# Patient Record
Sex: Male | Born: 1990 | Race: White | Hispanic: No | Marital: Single | Smoking: Never smoker
Health system: Southern US, Community
[De-identification: ages and names within clinical notes are randomized; demographics above are authoritative.]

## PROBLEM LIST (undated history)

## (undated) DIAGNOSIS — M25519 Pain in unspecified shoulder: Secondary | ICD-10-CM

## (undated) DIAGNOSIS — F909 Attention-deficit hyperactivity disorder, unspecified type: Secondary | ICD-10-CM

## (undated) DIAGNOSIS — F32A Depression, unspecified: Secondary | ICD-10-CM

## (undated) DIAGNOSIS — F419 Anxiety disorder, unspecified: Secondary | ICD-10-CM

## (undated) DIAGNOSIS — F329 Major depressive disorder, single episode, unspecified: Secondary | ICD-10-CM

## (undated) DIAGNOSIS — J45909 Unspecified asthma, uncomplicated: Secondary | ICD-10-CM

## (undated) DIAGNOSIS — M549 Dorsalgia, unspecified: Secondary | ICD-10-CM

## (undated) HISTORY — PX: UPPER GI ENDOSCOPY: SHX6162

---
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
## 2010-03-14 ENCOUNTER — Emergency Department (HOSPITAL_COMMUNITY): Admission: EM | Admit: 2010-03-14 | Discharge: 2010-03-14 | Payer: Self-pay | Admitting: Emergency Medicine

## 2010-07-11 ENCOUNTER — Emergency Department (HOSPITAL_COMMUNITY): Admission: EM | Admit: 2010-07-11 | Discharge: 2010-07-11 | Payer: Self-pay | Admitting: Emergency Medicine

## 2010-07-22 ENCOUNTER — Emergency Department (HOSPITAL_COMMUNITY): Admission: EM | Admit: 2010-07-22 | Discharge: 2010-07-22 | Payer: Self-pay | Admitting: Emergency Medicine

## 2010-10-17 ENCOUNTER — Emergency Department (HOSPITAL_COMMUNITY)
Admission: EM | Admit: 2010-10-17 | Discharge: 2010-10-18 | Payer: Self-pay | Source: Home / Self Care | Admitting: Emergency Medicine

## 2011-03-03 NOTE — Discharge Summary (Signed)
[name], Eugene Hicks NO.:  [id number]   MEDICAL RECORD NO.:  [id number]          PATIENT TYPE:  OBV   LOCATION:  6119                         FACILITY:  [facility]   PHYSICIAN:  Gerrianne Scale, M.D.DATE OF BIRTH:  1990/12/25   DATE OF ADMISSION:  06/07/2007  DATE OF DISCHARGE:  06/07/2007                               DISCHARGE SUMMARY   REASON FOR HOSPITALIZATION:  Blood around stool x1 five hours after  abdominal injury.  History of constipation and straining at stool.   SIGNIFICANT FINDINGS:  1. CBC at [facility], normal hemoglobin and hematocrit of 12.2 and      38.4.  2. Coags normal.  3. CT pelvis and abdomen, small amount of free fluid in pelvis.  4. Abdomen exam normal without bruising or tenderness.  5. Rectal exam at [facility], brown stool with blood in rectum, guaiac      positive.  6. Rectal exam at [facility]: No hemorrhoids or fissures seen.  7. No further bleeding while at [facility].   TREATMENT:  1. Protonix 40 mg IV daily.  2. IV fluids, D5, quarter-normal saline with 20 of potassium chloride      at 90 mL per hour.   HOME MEDICATIONS:  1. Strattera 40 mg p.o. daily.  2. Lithium 300 mg q.a.m., 600 mg q.p.m. per oral.  3. Seroquel 300 mg per oral q.h.s.  4. Propranolol 80 mg p.o. daily.  5. Xanax 1 mg p.o. p.r.n. for anxiety.   OPERATIONS AND PROCEDURES:  None.   FINAL DIAGNOSIS:  Rectal bleeding with constipation.   DISCHARGE MEDICATIONS AND INSTRUCTIONS:  MiraLax 17 grams or 1  tablespoon in fluid p.o. daily.   PENDING RESULTS AND ISSUES TO BE FOLLOWED:  Von Willebrand antigen and  ristocetin pending.  Psych meds need to be addressed.   FOLLOWUP:  Dr. Lacie Scotts, [facility], Thursday, August 21  at 10:45.  Mother declined follow up with [facility];  prefers to have PCP manage psych issues.   DISCHARGE WEIGHT:  53.6 kilograms.   CONDITION ON DISCHARGE:  Improved.      Pediatrics  Resident      Gerrianne Scale, M.D.  Electronically Signed    PR/MEDQ  D:  06/07/2007  T:  06/07/2007  Job:  [id number]   cc:   Evelene Croon

## 2011-04-19 ENCOUNTER — Emergency Department (HOSPITAL_COMMUNITY)
Admission: EM | Admit: 2011-04-19 | Discharge: 2011-04-19 | Disposition: A | Discharge status: Home / Self Care | Payer: Medicaid Other | Attending: Emergency Medicine | Admitting: Emergency Medicine

## 2011-04-19 DIAGNOSIS — I10 Essential (primary) hypertension: Secondary | ICD-10-CM | POA: Insufficient documentation

## 2011-04-19 DIAGNOSIS — M7989 Other specified soft tissue disorders: Secondary | ICD-10-CM | POA: Insufficient documentation

## 2011-04-19 DIAGNOSIS — T63391A Toxic effect of venom of other spider, accidental (unintentional), initial encounter: Secondary | ICD-10-CM | POA: Insufficient documentation

## 2011-04-19 DIAGNOSIS — T7840XA Allergy, unspecified, initial encounter: Secondary | ICD-10-CM | POA: Insufficient documentation

## 2011-04-19 DIAGNOSIS — F909 Attention-deficit hyperactivity disorder, unspecified type: Secondary | ICD-10-CM | POA: Insufficient documentation

## 2011-04-19 DIAGNOSIS — Z79899 Other long term (current) drug therapy: Secondary | ICD-10-CM | POA: Insufficient documentation

## 2011-04-19 DIAGNOSIS — F411 Generalized anxiety disorder: Secondary | ICD-10-CM | POA: Insufficient documentation

## 2011-04-19 DIAGNOSIS — J45909 Unspecified asthma, uncomplicated: Secondary | ICD-10-CM | POA: Insufficient documentation

## 2011-04-19 DIAGNOSIS — G8929 Other chronic pain: Secondary | ICD-10-CM | POA: Insufficient documentation

## 2011-04-19 DIAGNOSIS — T6391XA Toxic effect of contact with unspecified venomous animal, accidental (unintentional), initial encounter: Secondary | ICD-10-CM | POA: Insufficient documentation

## 2011-07-27 LAB — DIFFERENTIAL
Lymphocytes Relative: 12 — ABNORMAL LOW
Lymphs Abs: 1.9
Monocytes Relative: 4
Neutrophils Relative %: 82 — ABNORMAL HIGH

## 2011-07-27 LAB — RAPID URINE DRUG SCREEN, HOSP PERFORMED
Barbiturates: NOT DETECTED
Benzodiazepines: POSITIVE — AB
Cocaine: NOT DETECTED
Tetrahydrocannabinol: NOT DETECTED

## 2011-07-27 LAB — BASIC METABOLIC PANEL
Calcium: 10.7 — ABNORMAL HIGH
Creatinine, Ser: 0.89
Potassium: 3.8

## 2011-07-27 LAB — URINALYSIS, ROUTINE W REFLEX MICROSCOPIC
Bilirubin Urine: NEGATIVE
Glucose, UA: NEGATIVE
Protein, ur: NEGATIVE
Specific Gravity, Urine: 1.025
Urobilinogen, UA: 0.2

## 2011-07-27 LAB — CBC
HCT: 40
RDW: 15.6 — ABNORMAL HIGH
WBC: 15.7 — ABNORMAL HIGH

## 2011-07-27 LAB — ETHANOL: Alcohol, Ethyl (B): <5

## 2011-07-31 LAB — CBC
HCT: 35.9 — ABNORMAL LOW
Hemoglobin: 11.5 — ABNORMAL LOW
MCHC: 31.8
MCHC: 32
MCV: 63.4 — ABNORMAL LOW
MCV: 63.3 — ABNORMAL LOW
Platelets: 222
Platelets: 197
RBC: 5.66
RDW: 15.3 — ABNORMAL HIGH
RDW: 15.4 — ABNORMAL HIGH
WBC: 9.9

## 2011-07-31 LAB — PROTIME-INR
INR: 1.1
Prothrombin Time: 14.1

## 2011-07-31 LAB — URINALYSIS, ROUTINE W REFLEX MICROSCOPIC
Bilirubin Urine: NEGATIVE
Glucose, UA: NEGATIVE
Hgb urine dipstick: NEGATIVE
Ketones, ur: NEGATIVE
Nitrite: NEGATIVE
Protein, ur: NEGATIVE
Specific Gravity, Urine: 1.02
Urobilinogen, UA: 0.2
pH: 8

## 2011-07-31 LAB — BASIC METABOLIC PANEL WITH GFR
BUN: 10
CO2: 31
Calcium: 10.1
Chloride: 106
Creatinine, Ser: 0.93
Glucose, Bld: 95
Potassium: 3.6
Sodium: 142

## 2011-07-31 LAB — DIFFERENTIAL
Basophils Absolute: 0
Basophils Relative: 0
Eosinophils Absolute: 0.6
Eosinophils Relative: 5
Lymphocytes Relative: 17 — ABNORMAL LOW
Lymphs Abs: 2.2
Monocytes Absolute: 0.8
Monocytes Relative: 6
Neutro Abs: 9.4 — ABNORMAL HIGH
Neutrophils Relative %: 73 — ABNORMAL HIGH

## 2011-07-31 LAB — APTT: aPTT: 31

## 2011-07-31 LAB — FACTOR 8 RISTOCETIN COFACTOR: Von Willebrand Factor: 77 (ref 47–206)

## 2011-07-31 LAB — URINE CULTURE
Colony Count: NO GROWTH
Culture: NO GROWTH
Special Requests: NEGATIVE

## 2011-07-31 LAB — VON WILLEBRAND ANTIGEN: Von Willebrand Factor Ag: 90 (ref 51–211)

## 2011-08-26 ENCOUNTER — Encounter: Payer: Self-pay | Admitting: Emergency Medicine

## 2011-08-26 ENCOUNTER — Emergency Department (HOSPITAL_COMMUNITY)
Admission: EM | Admit: 2011-08-26 | Discharge: 2011-08-26 | Disposition: A | Discharge status: Home / Self Care | Payer: Medicaid Other | Attending: Emergency Medicine | Admitting: Emergency Medicine

## 2011-08-26 DIAGNOSIS — F909 Attention-deficit hyperactivity disorder, unspecified type: Secondary | ICD-10-CM | POA: Insufficient documentation

## 2011-08-26 DIAGNOSIS — K029 Dental caries, unspecified: Secondary | ICD-10-CM

## 2011-08-26 HISTORY — DX: Attention-deficit hyperactivity disorder, unspecified type: F90.9

## 2011-08-26 MED ORDER — HYDROCODONE-ACETAMINOPHEN 5-325 MG PO TABS
1.0000 | ORAL_TABLET | Freq: Once | ORAL | Status: AC
  Administered 2011-08-26: 1 via ORAL
  Filled 2011-08-26: qty 1

## 2011-08-26 MED ORDER — HYDROCODONE-ACETAMINOPHEN 5-325 MG PO TABS: 1.0000 | ORAL_TABLET | Freq: Once | ORAL | Status: AC

## 2011-08-26 MED ORDER — CLINDAMYCIN HCL 150 MG PO CAPS: 150.0000 mg | ORAL_CAPSULE | Freq: Four times a day (QID) | ORAL | Status: DC

## 2011-08-26 MED ORDER — CLINDAMYCIN HCL 150 MG PO CAPS
150.0000 mg | ORAL_CAPSULE | Freq: Once | ORAL | Status: AC
  Administered 2011-08-26: 150 mg via ORAL
  Filled 2011-08-26: qty 1

## 2011-08-26 NOTE — ED Notes (Signed)
Pt c/o left lower toothache x 3 days.

## 2011-08-26 NOTE — ED Notes (Signed)
Toothache on right side that started two days ago

## 2011-08-28 NOTE — ED Provider Notes (Signed)
History     CSN: [id number] Arrival date & time: 08/26/2011  3:02 PM   First MD Initiated Contact with Patient 08/26/11 1532      Chief Complaint  Patient presents with  . Dental Pain    (Consider location/radiation/quality/duration/timing/severity/associated sxs/prior treatment) Patient is a 20 y.o. male presenting with tooth pain. The history is provided by the patient.  Dental PainThe primary symptoms include mouth pain. Primary symptoms do not include headaches, fever, shortness of breath or sore throat. The symptoms began 3 to 5 days ago. The symptoms are worsening. The symptoms are new. The symptoms occur constantly.  Additional symptoms include: dental sensitivity to temperature, gum swelling, gum tenderness and jaw pain. Additional symptoms do not include: facial swelling and trouble swallowing.    Past Medical History  Diagnosis Date  . ADHD (attention deficit hyperactivity disorder)     History reviewed. No pertinent past surgical history.  No family history on file.  History  Substance Use Topics  . Smoking status: Never Smoker   . Smokeless tobacco: Not on file  . Alcohol Use: No      Review of Systems  Constitutional: Negative for fever.  HENT: Positive for dental problem. Negative for congestion, sore throat, facial swelling, trouble swallowing and neck pain.   Eyes: Negative.   Respiratory: Negative for chest tightness and shortness of breath.   Cardiovascular: Negative for chest pain.  Gastrointestinal: Negative for nausea and abdominal pain.  Genitourinary: Negative.   Musculoskeletal: Negative for joint swelling and arthralgias.  Skin: Negative.  Negative for rash.  Neurological: Negative for dizziness, weakness, light-headedness, numbness and headaches.  Hematological: Negative.   Psychiatric/Behavioral: Negative.     Allergies  Penicillins  Home Medications   Current Outpatient Rx  Name Route Sig Dispense Refill  . ALPRAZOLAM 1 MG PO TABS  Oral Take 1 mg by mouth 4 (four) times daily. As directed     . ATOMOXETINE HCL 40 MG PO CAPS Oral Take 40 mg by mouth 2 (two) times daily.      . AZITHROMYCIN 250 MG PO TABS Oral Take 250 mg by mouth daily. Take two tablets on day 1, then take one tablet daily for 4 days thereafter, then STOP     . BENZONATATE 100 MG PO CAPS Oral Take 100 mg by mouth 3 (three) times daily.      Marland Kitchen DEXAMETHASONE 1.5 MG PO KIT Oral Take 1 each by mouth as directed. Per package instructions     . ERYTHROMYCIN ETHYLSUCCINATE 400 MG PO TABS Oral Take 400 mg by mouth daily.      Marland Kitchen FLUTICASONE PROPIONATE 50 MCG/ACT NA SUSP Nasal Place 2 sprays into the nose daily.      Marland Kitchen LITHIUM CARBONATE 300 MG PO CAPS Oral Take 300-600 mg by mouth 2 (two) times daily. Take one capsule (300mg ) every day in the morning, and take two capsules (600mg ) every day at bedtime     . MONTELUKAST SODIUM 10 MG PO TABS Oral Take 10 mg by mouth daily.      Marland Kitchen PROPRANOLOL HCL SR BEADS 80 MG PO CP24 Oral Take 80 mg by mouth at bedtime.      Marland Kitchen QUETIAPINE FUMARATE 300 MG PO TABS Oral Take 300 mg by mouth at bedtime.      Marland Kitchen CLINDAMYCIN HCL 150 MG PO CAPS Oral Take 1 capsule (150 mg total) by mouth 4 (four) times daily. 28 capsule 0  . HYDROCODONE-ACETAMINOPHEN 5-325 MG PO TABS Oral Take 1  tablet by mouth once. 20 tablet 0    BP 110/58  Pulse 94  Temp(Src) 98.2 F (36.8 C) (Oral)  Resp 18  SpO2 100%  Physical Exam  Constitutional: He is oriented to person, place, and time. He appears well-developed and well-nourished. No distress.  HENT:  Head: Normocephalic and atraumatic.  Right Ear: Tympanic membrane and external ear normal.  Left Ear: Tympanic membrane and external ear normal.  Mouth/Throat: Oropharynx is clear and moist and mucous membranes are normal. No oral lesions. Abnormal dentition. Dental abscesses and dental caries present.       Generalized very poor dentition,  With left lower gingival edema surrounding 1st and 2nd left lower molar  teeth.   Eyes: Conjunctivae are normal.  Neck: Normal range of motion. Neck supple.  Cardiovascular: Normal rate and normal heart sounds.   Pulmonary/Chest: Effort normal.  Abdominal: He exhibits no distension.  Musculoskeletal: Normal range of motion.  Lymphadenopathy:    He has no cervical adenopathy.  Neurological: He is alert and oriented to person, place, and time.  Skin: Skin is warm and dry. No erythema.  Psychiatric: He has a normal mood and affect.    ED Course  Procedures (including critical care time)  Labs Reviewed - No data to display No results found.   1. Dental decay       MDM  Dental abscess with widespread dental decay.  Patient has an appointment with an oral surgeon in [place] next week.        Candis Musa, PA 08/28/11 1702

## 2011-08-28 NOTE — ED Provider Notes (Signed)
Medical screening examination/treatment/procedure(s) were performed by non-physician practitioner and as supervising physician I was immediately available for consultation/collaboration.   Joya Gaskins, MD 08/28/11 Corky Crafts

## 2011-08-31 ENCOUNTER — Encounter (HOSPITAL_COMMUNITY): Payer: Self-pay | Admitting: Emergency Medicine

## 2011-08-31 ENCOUNTER — Emergency Department (HOSPITAL_COMMUNITY)
Admission: EM | Admit: 2011-08-31 | Discharge: 2011-08-31 | Disposition: A | Discharge status: Home / Self Care | Payer: Medicaid Other | Attending: Emergency Medicine | Admitting: Emergency Medicine

## 2011-08-31 DIAGNOSIS — K921 Melena: Secondary | ICD-10-CM | POA: Insufficient documentation

## 2011-08-31 DIAGNOSIS — R05 Cough: Secondary | ICD-10-CM | POA: Insufficient documentation

## 2011-08-31 DIAGNOSIS — R059 Cough, unspecified: Secondary | ICD-10-CM | POA: Insufficient documentation

## 2011-08-31 DIAGNOSIS — M549 Dorsalgia, unspecified: Secondary | ICD-10-CM | POA: Insufficient documentation

## 2011-08-31 DIAGNOSIS — F909 Attention-deficit hyperactivity disorder, unspecified type: Secondary | ICD-10-CM | POA: Insufficient documentation

## 2011-08-31 DIAGNOSIS — J40 Bronchitis, not specified as acute or chronic: Secondary | ICD-10-CM | POA: Insufficient documentation

## 2011-08-31 DIAGNOSIS — J3489 Other specified disorders of nose and nasal sinuses: Secondary | ICD-10-CM | POA: Insufficient documentation

## 2011-08-31 MED ORDER — HYDROCODONE-ACETAMINOPHEN 5-325 MG PO TABS: ORAL_TABLET | ORAL | Status: DC

## 2011-08-31 MED ORDER — BENZONATATE 100 MG PO CAPS: 100.0000 mg | ORAL_CAPSULE | Freq: Three times a day (TID) | ORAL | Status: AC

## 2011-08-31 MED ORDER — PREDNISONE 20 MG PO TABS: ORAL_TABLET | ORAL | Status: DC

## 2011-08-31 NOTE — ED Notes (Signed)
C/o upper back pain; was recently dx with bronchitis by PCP; finished Rx (z-pak) yesterday; alert, in no distress.

## 2011-08-31 NOTE — ED Notes (Signed)
Pt was seen by pcp and diagnosed with bronchitis. Pt is done with his antibiotic and c/o upper back pain.

## 2011-08-31 NOTE — ED Provider Notes (Signed)
History     CSN: [id number] Arrival date & time: 08/31/2011  9:05 AM   First MD Initiated Contact with Patient 08/31/11 [phone]      Chief Complaint  Patient presents with  . Back Pain  . Cough    (Consider location/radiation/quality/duration/timing/severity/associated sxs/prior treatment) HPI Comments: Patient c/o cough, congestion for several days.  States he was recently seen by his PMD for this and currently being treated with antibiotic for bronchitis.  Comes to ED c/o continued symptoms and pain to his middle back that's present with excessive cough or certain movements.  He denies fever, vomiting, shortness of breath, or  chest pain   Patient is a 20 y.o. male presenting with cough. The history is provided by the patient and a parent.  Cough This is a new problem. The current episode started more than 2 days ago. The problem occurs constantly. The problem has not changed since onset.The cough is productive of sputum. There has been no fever. Pertinent negatives include no chest pain, no chills, no ear congestion, no ear pain, no rhinorrhea, no sore throat, no myalgias, no shortness of breath and no wheezing. Treatments tried: antibiotic. The treatment provided no relief. He is not a smoker. His past medical history does not include pneumonia, COPD or asthma.    Past Medical History  Diagnosis Date  . ADHD (attention deficit hyperactivity disorder)     History reviewed. No pertinent past surgical history.  History reviewed. No pertinent family history.  History  Substance Use Topics  . Smoking status: Never Smoker   . Smokeless tobacco: Not on file  . Alcohol Use: No      Review of Systems  Constitutional: Negative for fever, chills and fatigue.  HENT: Positive for congestion. Negative for ear pain, sore throat, rhinorrhea, trouble swallowing, neck pain and neck stiffness.   Respiratory: Positive for cough. Negative for chest tightness, shortness of breath and wheezing.    Cardiovascular: Negative for chest pain and palpitations.  Gastrointestinal: Positive for blood in stool. Negative for nausea, vomiting and abdominal pain.  Genitourinary: Negative for dysuria, hematuria, flank pain and decreased urine volume.  Musculoskeletal: Positive for back pain. Negative for myalgias, joint swelling and arthralgias.  Skin: Negative for rash.  Neurological: Negative for dizziness, weakness and numbness.  Hematological: Negative for adenopathy. Does not bruise/bleed easily.  All other systems reviewed and are negative.    Allergies  Penicillins  Home Medications   Current Outpatient Rx  Name Route Sig Dispense Refill  . AZITHROMYCIN 250 MG PO TABS Oral Take 250 mg by mouth daily. Take two tablets on day 1, then take one tablet daily for 4 days thereafter, then STOP     . ALPRAZOLAM 1 MG PO TABS Oral Take 1 mg by mouth 4 (four) times daily. As directed     . ATOMOXETINE HCL 40 MG PO CAPS Oral Take 40 mg by mouth 2 (two) times daily.      Marland Kitchen BENZONATATE 100 MG PO CAPS Oral Take 100 mg by mouth 3 (three) times daily.      Marland Kitchen CLINDAMYCIN HCL 150 MG PO CAPS Oral Take 1 capsule (150 mg total) by mouth 4 (four) times daily. 28 capsule 0  . DEXAMETHASONE 1.5 MG PO KIT Oral Take 1 each by mouth as directed. Per package instructions    . ERYTHROMYCIN ETHYLSUCCINATE 400 MG PO TABS Oral Take 400 mg by mouth daily.      Marland Kitchen FLUTICASONE PROPIONATE 50 MCG/ACT NA SUSP Nasal  Place 2 sprays into the nose daily.      Marland Kitchen HYDROCODONE-ACETAMINOPHEN 5-325 MG PO TABS Oral Take 1 tablet by mouth once. 20 tablet 0  . LITHIUM CARBONATE 300 MG PO CAPS Oral Take 300-600 mg by mouth 2 (two) times daily. Take one capsule (300mg ) every day in the morning, and take two capsules (600mg ) every day at bedtime     . MONTELUKAST SODIUM 10 MG PO TABS Oral Take 10 mg by mouth daily.      Marland Kitchen PROPRANOLOL HCL SR BEADS 80 MG PO CP24 Oral Take 80 mg by mouth at bedtime.      Marland Kitchen QUETIAPINE FUMARATE 300 MG PO TABS  Oral Take 300 mg by mouth at bedtime.        BP 115/57  Pulse 105  Temp(Src) 98.1 F (36.7 C) (Oral)  Resp 18  Ht 5\' 4"  (1.626 m)  Wt 140 lb (63.504 kg)  BMI 24.03 kg/m2  SpO2 100%  Physical Exam  Nursing note and vitals reviewed. Constitutional: He is oriented to person, place, and time. He appears well-developed and well-nourished. No distress.  HENT:  Head: Normocephalic and atraumatic.  Mouth/Throat: Oropharynx is clear and moist.  Neck: Normal range of motion. Neck supple.  Cardiovascular: Normal rate, regular rhythm and normal heart sounds.   Pulmonary/Chest: Effort normal and breath sounds normal. No respiratory distress. He has no wheezes. He has no rhonchi. He has no rales. He exhibits no tenderness.       diffuse inspir and expir wheezes bilaterally. Lung sounds are coarse throughout  Abdominal: Soft. He exhibits no distension. There is no tenderness.  Musculoskeletal: Normal range of motion. He exhibits no tenderness.  Lymphadenopathy:    He has no cervical adenopathy.  Neurological: He is alert and oriented to person, place, and time. No cranial nerve deficit. He exhibits normal muscle tone. Coordination normal.  Skin: Skin is warm and dry.  Psychiatric: He has a normal mood and affect.    ED Course  Procedures (including critical care time)       MDM      10:12 AM patient is alert, NAD.  Non-toxic appearing.  Mucous membranes are moist.  No hypoxia or fever,  diffuse inspir and expir wheezes bilaterally.  My clinical suspicion for PE or PNA is low.  Likely bronchitis.  Just finished abx prescribed by PCP.    Recent ED chart was reviewed.  Patient has multiple ED visits.  Appears to be at his baseline today.    Patient / Family / Caregiver understand and agree with initial ED impression and plan with expectations set for ED visit.   Joselle Deeds L. [place], [place] 09/01/11 2042

## 2011-09-03 NOTE — ED Provider Notes (Signed)
Medical screening examination/treatment/procedure(s) were performed by non-physician practitioner and as supervising physician I was immediately available for consultation/collaboration.  Nicoletta Dress. Colon Branch, MD 09/03/11 1034

## 2011-09-05 ENCOUNTER — Emergency Department (HOSPITAL_COMMUNITY): Payer: Medicaid Other

## 2011-09-05 ENCOUNTER — Emergency Department (HOSPITAL_COMMUNITY)
Admission: EM | Admit: 2011-09-05 | Discharge: 2011-09-05 | Disposition: A | Discharge status: Home / Self Care | Payer: Medicaid Other | Attending: Emergency Medicine | Admitting: Emergency Medicine

## 2011-09-05 ENCOUNTER — Encounter (HOSPITAL_COMMUNITY): Payer: Self-pay | Admitting: Emergency Medicine

## 2011-09-05 ENCOUNTER — Other Ambulatory Visit: Payer: Self-pay

## 2011-09-05 DIAGNOSIS — F419 Anxiety disorder, unspecified: Secondary | ICD-10-CM

## 2011-09-05 DIAGNOSIS — J329 Chronic sinusitis, unspecified: Secondary | ICD-10-CM | POA: Insufficient documentation

## 2011-09-05 DIAGNOSIS — Z79899 Other long term (current) drug therapy: Secondary | ICD-10-CM | POA: Insufficient documentation

## 2011-09-05 DIAGNOSIS — F411 Generalized anxiety disorder: Secondary | ICD-10-CM | POA: Insufficient documentation

## 2011-09-05 DIAGNOSIS — R079 Chest pain, unspecified: Secondary | ICD-10-CM | POA: Insufficient documentation

## 2011-09-05 DIAGNOSIS — R509 Fever, unspecified: Secondary | ICD-10-CM | POA: Insufficient documentation

## 2011-09-05 DIAGNOSIS — R42 Dizziness and giddiness: Secondary | ICD-10-CM | POA: Insufficient documentation

## 2011-09-05 MED ORDER — DOXYCYCLINE HYCLATE 100 MG PO CAPS: 100.0000 mg | ORAL_CAPSULE | Freq: Two times a day (BID) | ORAL | Status: AC

## 2011-09-05 NOTE — ED Provider Notes (Signed)
Scribed for Shelda Jakes, MD, the patient was seen in room APA04/APA04. This chart was scribed by [employer]. The patient's care started at 09:14  CSN: [id number] Arrival date & time: 09/05/2011  9:13 AM   First MD Initiated Contact with Patient 09/05/11 0914      Chief Complaint  Patient presents with  . Chest Pain  . Anxiety   HPI Eugene Hicks is a 20 y.o. male who presents to the Emergency Department complaining of Chest Pain. Reports having Z-pack. treatment 2 weeks ago without any alleviation. Per father, patient was recently diagnosed with bronchitis. Reports using Albuterol inhaler at home. Patient reports that he feels very nervous. He also reports associated mild productive cough, mild fever, chest pain. Patient also complains that he cannot stand and walk. States that he feels dizzy. Denies nausea, vomiting, back pain or rash.  Past Medical History  Diagnosis Date  . ADHD (attention deficit hyperactivity disorder)     History reviewed. No pertinent past surgical history.  History reviewed. No pertinent family history.  History  Substance Use Topics  . Smoking status: Never Smoker   . Smokeless tobacco: Not on file  . Alcohol Use: No      Review of Systems  Constitutional: Positive for fever.       10 Systems reviewed and are negative for acute change except as noted in the HPI.  HENT: Negative for rhinorrhea.   Eyes: Negative for discharge and redness.  Respiratory: Positive for cough. Negative for shortness of breath.   Cardiovascular: Positive for chest pain.  Gastrointestinal: Negative for nausea, vomiting, abdominal pain and diarrhea.  Genitourinary: Negative for dysuria.  Musculoskeletal: Negative for back pain.  Skin: Negative for rash.  Neurological: Positive for dizziness. Negative for syncope, speech difficulty, weakness, numbness and headaches.  Psychiatric/Behavioral: Negative for suicidal ideas, hallucinations and confusion. The  patient is nervous/anxious.   All other systems reviewed and are negative.    Allergies  Penicillins  Home Medications   Current Outpatient Rx  Name Route Sig Dispense Refill  . ALPRAZOLAM 1 MG PO TABS Oral Take 1 mg by mouth 4 (four) times daily. As directed     . ATOMOXETINE HCL 40 MG PO CAPS Oral Take 40 mg by mouth 2 (two) times daily.      . AZITHROMYCIN 250 MG PO TABS Oral Take 250 mg by mouth daily. Take two tablets on day 1, then take one tablet daily for 4 days thereafter, then STOP     . BENZONATATE 100 MG PO CAPS Oral Take 100 mg by mouth 3 (three) times daily.      Marland Kitchen BENZONATATE 100 MG PO CAPS Oral Take 1 capsule (100 mg total) by mouth every 8 (eight) hours. Prn cough 21 capsule 0  . CLINDAMYCIN HCL 150 MG PO CAPS Oral Take 1 capsule (150 mg total) by mouth 4 (four) times daily. 28 capsule 0  . DEXAMETHASONE 1.5 MG PO KIT Oral Take 1 each by mouth as directed. Per package instructions    . ERYTHROMYCIN ETHYLSUCCINATE 400 MG PO TABS Oral Take 400 mg by mouth daily.      Marland Kitchen FLUTICASONE PROPIONATE 50 MCG/ACT NA SUSP Nasal Place 2 sprays into the nose daily.      Marland Kitchen HYDROCODONE-ACETAMINOPHEN 5-325 MG PO TABS Oral Take 1 tablet by mouth once. 20 tablet 0  . HYDROCODONE-ACETAMINOPHEN 5-325 MG PO TABS  Take one-two tabs po q 4-6 hrs prn pain 15 tablet 0  . LITHIUM  CARBONATE 300 MG PO CAPS Oral Take 300-600 mg by mouth 2 (two) times daily. Take one capsule (300mg ) every day in the morning, and take two capsules (600mg ) every day at bedtime     . MONTELUKAST SODIUM 10 MG PO TABS Oral Take 10 mg by mouth daily.      Marland Kitchen PREDNISONE 20 MG PO TABS  Take one tablet twice a day for 4 days 8 tablet 0  . PROPRANOLOL HCL SR BEADS 80 MG PO CP24 Oral Take 80 mg by mouth at bedtime.      Marland Kitchen QUETIAPINE FUMARATE 300 MG PO TABS Oral Take 300 mg by mouth at bedtime.        BP 141/77  Pulse 78  Temp(Src) 97.7 F (36.5 C) (Oral)  Resp 18  Ht 5\' 4"  (1.626 m)  Wt 140 lb (63.504 kg)  BMI 24.03 kg/m2   SpO2 100%  Physical Exam  Nursing note and vitals reviewed. Constitutional: He is oriented to person, place, and time. He appears well-developed and well-nourished. No distress.       Awake, alert, nontoxic appearance with baseline speech for patient.  HENT:  Head: Normocephalic and atraumatic.  Right Ear: Tympanic membrane normal.  Left Ear: Tympanic membrane normal.  Mouth/Throat: Oropharynx is clear and moist. No oropharyngeal exudate.  Eyes: EOM are normal. Pupils are equal, round, and reactive to light. Right eye exhibits no discharge. Left eye exhibits no discharge.  Neck: Neck supple. No tracheal deviation present.  Cardiovascular: Normal rate and regular rhythm.   No murmur heard. Pulmonary/Chest: Effort normal and breath sounds normal. No stridor. No respiratory distress. He has no wheezes. He has no rales. He exhibits no tenderness.  Abdominal: Soft. Bowel sounds are normal. He exhibits no mass. There is no tenderness. There is no rebound.  Musculoskeletal: Normal range of motion. He exhibits no edema and no tenderness.       Baseline ROM, moves extremities with no obvious new focal weakness.  Lymphadenopathy:    He has no cervical adenopathy.  Neurological: He is alert and oriented to person, place, and time.       Awake, alert, cooperative and aware of situation; motor strength bilaterally; sensation normal to light touch bilaterally; no facial asymmetry; tongue midline; major cranial nerves appear intact; no pronator drift,  Skin: Skin is warm and dry. No rash noted. He is not diaphoretic. No erythema.  Psychiatric: He has a normal mood and affect.    ED Course  Procedures  DIAGNOSTIC STUDIES: Oxygen Saturation is 100% on room air, normal by my interpretation.    COORDINATION OF CARE: 09:54 - EDP examined patient at bedside and ordered the following  Orders Placed This Encounter  Procedures  . DG Chest 2 View  . CT Head Wo Contrast   Dg Chest 2 View  09/05/2011   *RADIOLOGY REPORT*  Clinical Data: Chest pain, cough  CHEST - 2 VIEW  Comparison: 10/17/2010  Findings: Lungs are clear.  No pleural effusion or pneumothorax.  Cardiomediastinal silhouette is within normal limits.  Visualized osseous structures are within normal limits.  IMPRESSION: Normal chest radiographs.  Original Report Authenticated By: Charline Bills, M.D.   Ct Head Wo Contrast  09/05/2011  *RADIOLOGY REPORT*  Clinical Data: Anxiety  CT HEAD WITHOUT CONTRAST  Technique:  Contiguous axial images were obtained from the base of the skull through the vertex without contrast.  Comparison: 10/04/2006  Findings: Examination is degraded by patient motion.  Images were repeated. No acute hemorrhage,  acute infarction, or mass lesion is seen.  No midline shift.  Ventricles normal in size.  Orbits are unremarkable. Pansinusitis noted.  No skull fracture.  IMPRESSION: No acute intracranial finding.  Pansinusitis.  Original Report Authenticated By: Harrel Lemon, M.D.      Date: 09/05/2011  Rate: 89  Rhythm: normal sinus rhythm  QRS Axis: normal  Intervals: normal  ST/T Wave abnormalities: nonspecific ST changes  Conduction Disutrbances:nonspecific intraventricular conduction delay  Narrative Interpretation:   Old EKG Reviewed: none available    MDM:  Chest x-ray negative for pneumonia or pneumothorax. Patient without significant chest pain in the emergency department. Patient without significant evidence of anxiety or panic attack in the emergency department. CAT scan of the head consistent with a pansinusitis. She initially was treated with clindamycin that would not help [place] very much. Recently he was treated with Zithromax and is just finishing up the course. Will therefore change to doxycycline and treat for 10 days. Patient has followup with her primary care doctor locally. We'll withhold any further treatment of the anxiety since he is on several other medications that alter behavior. Will  leave the change in these medicines to his primary care doctor that he needs seen in the next 2 days.   Impression: Sinusitis Anxiety Chest pain  Scribe Attestation I personally performed the services described in this documentation, which was scribed in my presence. The recorded information has been reviewed and considered.       Shelda Jakes, MD 09/05/11 1126

## 2011-09-05 NOTE — ED Notes (Signed)
Pt c/o chest pain, he was recently diagnosed with bronchitis. Pt state it feels like he has been kicked in the back. Pt states he feels very nervous.

## 2011-09-10 ENCOUNTER — Emergency Department (HOSPITAL_COMMUNITY)
Admission: EM | Admit: 2011-09-10 | Discharge: 2011-09-10 | Disposition: A | Discharge status: Home / Self Care | Payer: Medicaid Other | Attending: Emergency Medicine | Admitting: Emergency Medicine

## 2011-09-10 ENCOUNTER — Encounter (HOSPITAL_COMMUNITY): Payer: Self-pay

## 2011-09-10 DIAGNOSIS — S61219A Laceration without foreign body of unspecified finger without damage to nail, initial encounter: Secondary | ICD-10-CM

## 2011-09-10 DIAGNOSIS — W268XXA Contact with other sharp object(s), not elsewhere classified, initial encounter: Secondary | ICD-10-CM | POA: Insufficient documentation

## 2011-09-10 DIAGNOSIS — Y92009 Unspecified place in unspecified non-institutional (private) residence as the place of occurrence of the external cause: Secondary | ICD-10-CM | POA: Insufficient documentation

## 2011-09-10 DIAGNOSIS — S61209A Unspecified open wound of unspecified finger without damage to nail, initial encounter: Secondary | ICD-10-CM | POA: Insufficient documentation

## 2011-09-10 DIAGNOSIS — F909 Attention-deficit hyperactivity disorder, unspecified type: Secondary | ICD-10-CM | POA: Insufficient documentation

## 2011-09-10 MED ORDER — HYDROCODONE-ACETAMINOPHEN 5-325 MG PO TABS: ORAL_TABLET | ORAL | Status: AC

## 2011-09-10 MED ORDER — HYDROCODONE-ACETAMINOPHEN 5-325 MG PO TABS
1.0000 | ORAL_TABLET | Freq: Once | ORAL | Status: AC
  Administered 2011-09-10: 1 via ORAL
  Filled 2011-09-10: qty 1

## 2011-09-10 NOTE — ED Notes (Addendum)
Pt presents with laceration to right middle finger. Pt was working on TV and cut finger on a piece of metal. Bleeding controlled at this time.

## 2011-09-11 NOTE — ED Provider Notes (Signed)
History     CSN: [id number] Arrival date & time: 09/10/2011  4:54 PM   First MD Initiated Contact with Patient 09/10/11 1708      Chief Complaint  Patient presents with  . Laceration    (Consider location/radiation/quality/duration/timing/severity/associated sxs/prior treatment) Patient is a 20 y.o. male presenting with skin laceration. The history is provided by the patient and a parent.  Laceration  The incident occurred 1 to 2 hours ago. Pain location: right middle finger. The laceration is 3 cm in size. The laceration mechanism was a a metal edge. The pain is moderate. The pain has been constant since onset. He reports no foreign bodies present. His tetanus status is UTD.    Past Medical History  Diagnosis Date  . ADHD (attention deficit hyperactivity disorder)     History reviewed. No pertinent past surgical history.  History reviewed. No pertinent family history.  History  Substance Use Topics  . Smoking status: Never Smoker   . Smokeless tobacco: Not on file  . Alcohol Use: No      Review of Systems  Constitutional: Negative for chills.  HENT: Negative for sore throat and trouble swallowing.   Gastrointestinal: Negative for vomiting, blood in stool and rectal pain.  Genitourinary: Negative for hematuria.  Skin: Positive for wound. Negative for rash.  Neurological: Negative for dizziness, weakness and numbness.  Hematological: Negative for adenopathy. Does not bruise/bleed easily.  All other systems reviewed and are negative.    Allergies  Penicillins  Home Medications   Current Outpatient Rx  Name Route Sig Dispense Refill  . ALPRAZOLAM 1 MG PO TABS Oral Take 1 mg by mouth 4 (four) times daily. As directed     . ATOMOXETINE HCL 40 MG PO CAPS Oral Take 40 mg by mouth 2 (two) times daily.      Marland Kitchen DOXYCYCLINE HYCLATE 100 MG PO CAPS Oral Take 1 capsule (100 mg total) by mouth 2 (two) times daily. 20 capsule 0  . FLUTICASONE PROPIONATE 50 MCG/ACT NA SUSP  Nasal Place 2 sprays into the nose daily.      Marland Kitchen LITHIUM CARBONATE 300 MG PO CAPS Oral Take 300-600 mg by mouth 2 (two) times daily. Take one capsule (300mg ) every day in the morning, and take two capsules (600mg ) every day at bedtime     . MONTELUKAST SODIUM 10 MG PO TABS Oral Take 10 mg by mouth daily.      Marland Kitchen PROPRANOLOL HCL SR BEADS 80 MG PO CP24 Oral Take 80 mg by mouth at bedtime.      Marland Kitchen QUETIAPINE FUMARATE 300 MG PO TABS Oral Take 300 mg by mouth at bedtime.      Marland Kitchen HYDROCODONE-ACETAMINOPHEN 5-325 MG PO TABS  Take one-two tabs po q 4-6 hrs prn pain 8 tablet 0    BP 109/67  Pulse 80  Temp(Src) 97.2 F (36.2 C) (Oral)  Resp 16  Ht 5\' 4"  (1.626 m)  Wt 140 lb (63.504 kg)  BMI 24.03 kg/m2  SpO2 100%  Physical Exam  Nursing note and vitals reviewed. Constitutional: He is oriented to person, place, and time. He appears well-developed and well-nourished. No distress.  HENT:  Head: Normocephalic and atraumatic.  Mouth/Throat: Oropharynx is clear and moist.  Cardiovascular: Normal rate, regular rhythm and normal heart sounds.   Pulmonary/Chest: Effort normal and breath sounds normal. No respiratory distress. He exhibits no tenderness.  Abdominal: There is tenderness.  Musculoskeletal: Normal range of motion. He exhibits tenderness. He exhibits no edema.  Right hand: He exhibits tenderness and laceration. He exhibits normal range of motion, no bony tenderness, normal two-point discrimination, normal capillary refill, no deformity and no swelling. normal sensation noted. Normal strength noted.       Hands: Neurological: He is alert and oriented to person, place, and time. No cranial nerve deficit. He exhibits normal muscle tone. Coordination normal.  Skin: Skin is warm and dry.       See MS exam    ED Course  Procedures (including critical care time) LACERATION REPAIR Performed by: Matty Deamer L. Authorized by: Maxwell Caul Consent: Verbal consent obtained. Risks and  benefits: risks, benefits and alternatives were discussed Consent given by: patient Patient identity confirmed: provided demographic data Prepped and Draped in normal sterile fashion Wound explored  Laceration Location:right middle finger  Laceration Length: 3cm  No Foreign Bodies seen or palpated  Anesthesia: none Local anesthetic:    Anesthetic total:  Irrigation method: syringe Amount of cleaning: standard  Skin closure  :tissue adhesive Number of sutures  Technique:  Patient tolerance: Patient tolerated the procedure well with no immediate complications.   1. Laceration of finger       MDM     Wound(s) explored with adequate hemostasis through ROM, no apparent gross foreign body retained, no significant involvement of deep structures such as bone / joint / tendon / or neurovascular involvement noted.  Baseline Strength and Sensation to affected extremity(ies) with normal light touch for Pt, distal NVI with CR< 2 secs and pulse(s) intact to affected extremity(ies).      Naylani Bradner L. [place], [place] 09/11/11 [phone]

## 2011-09-12 NOTE — ED Provider Notes (Signed)
Evaluation and management procedures were performed by the PA/NP under my supervision/collaboration.   Dione Booze, MD 09/12/11 [phone]

## 2011-10-14 ENCOUNTER — Encounter (HOSPITAL_COMMUNITY): Payer: Self-pay | Admitting: *Deleted

## 2011-10-14 ENCOUNTER — Emergency Department (HOSPITAL_COMMUNITY)
Admission: EM | Admit: 2011-10-14 | Discharge: 2011-10-14 | Disposition: A | Discharge status: Home / Self Care | Payer: Medicaid Other | Attending: Emergency Medicine | Admitting: Emergency Medicine

## 2011-10-14 DIAGNOSIS — M79609 Pain in unspecified limb: Secondary | ICD-10-CM | POA: Insufficient documentation

## 2011-10-14 DIAGNOSIS — S61209A Unspecified open wound of unspecified finger without damage to nail, initial encounter: Secondary | ICD-10-CM | POA: Insufficient documentation

## 2011-10-14 DIAGNOSIS — W268XXA Contact with other sharp object(s), not elsewhere classified, initial encounter: Secondary | ICD-10-CM | POA: Insufficient documentation

## 2011-10-14 DIAGNOSIS — Z79899 Other long term (current) drug therapy: Secondary | ICD-10-CM | POA: Insufficient documentation

## 2011-10-14 DIAGNOSIS — S61219A Laceration without foreign body of unspecified finger without damage to nail, initial encounter: Secondary | ICD-10-CM

## 2011-10-14 DIAGNOSIS — F909 Attention-deficit hyperactivity disorder, unspecified type: Secondary | ICD-10-CM | POA: Insufficient documentation

## 2011-10-14 NOTE — ED Notes (Signed)
Superficial lac to lt thumb. 

## 2011-10-14 NOTE — ED Notes (Signed)
Superficial cut to pad of lt thumb.  Cut when setting up his stereo system.  No LOM . Normal sensation.

## 2011-10-14 NOTE — ED Provider Notes (Signed)
Medical screening examination/treatment/procedure(s) were performed by non-physician practitioner and as supervising physician I was immediately available for consultation/collaboration.  Nicoletta Dress. Colon Branch, MD 10/14/11 2124

## 2011-10-14 NOTE — ED Provider Notes (Signed)
History     CSN: [id number]  Arrival date & time 10/14/11  1219   First MD Initiated Contact with Patient 10/14/11 1251      Chief Complaint  Patient presents with  . Laceration    (Consider location/radiation/quality/duration/timing/severity/associated sxs/prior treatment) Patient is a 20 y.o. male presenting with skin laceration. The history is provided by the patient and a parent. No language interpreter was used.  Laceration  The incident occurred 1 to 2 hours ago. The laceration is 1 cm in size. Injury mechanism: a sharp edge on a new entertainment center. The pain is moderate. It is unknown if a foreign body is present. His tetanus status is UTD.    Past Medical History  Diagnosis Date  . ADHD (attention deficit hyperactivity disorder)     History reviewed. No pertinent past surgical history.  No family history on file.  History  Substance Use Topics  . Smoking status: Never Smoker   . Smokeless tobacco: Not on file  . Alcohol Use: No      Review of Systems  Skin: Positive for wound.  All other systems reviewed and are negative.    Allergies  Codeine and Penicillins  Home Medications   Current Outpatient Rx  Name Route Sig Dispense Refill  . ALPRAZOLAM 1 MG PO TABS Oral Take 1 mg by mouth 4 (four) times daily. As directed     . ATOMOXETINE HCL 40 MG PO CAPS Oral Take 40 mg by mouth 2 (two) times daily.      Marland Kitchen FLUTICASONE PROPIONATE 50 MCG/ACT NA SUSP Nasal Place 2 sprays into the nose daily.      Marland Kitchen LITHIUM CARBONATE 300 MG PO CAPS Oral Take 300-600 mg by mouth 2 (two) times daily. Take one capsule (300mg ) every day in the morning, and take two capsules (600mg ) every day at bedtime     . MONTELUKAST SODIUM 10 MG PO TABS Oral Take 10 mg by mouth daily.      Marland Kitchen PROPRANOLOL HCL SR BEADS 80 MG PO CP24 Oral Take 80 mg by mouth at bedtime.      Marland Kitchen QUETIAPINE FUMARATE 300 MG PO TABS Oral Take 300 mg by mouth at bedtime.        BP 112/53  Pulse 54  Temp(Src)  98.5 F (36.9 C) (Oral)  Resp 18  SpO2 100%  Physical Exam  Nursing note and vitals reviewed. Constitutional: He is oriented to person, place, and time. He appears well-developed and well-nourished.  HENT:  Head: Normocephalic and atraumatic.  Eyes: EOM are normal.  Neck: Normal range of motion.  Cardiovascular: Normal rate, regular rhythm, normal heart sounds and intact distal pulses.   Pulmonary/Chest: Effort normal and breath sounds normal. No respiratory distress.  Abdominal: Soft. He exhibits no distension. There is no tenderness.  Musculoskeletal: Normal range of motion.       Left hand: He exhibits tenderness. He exhibits no bony tenderness, normal two-point discrimination and normal capillary refill. normal sensation noted. Normal strength noted.       Hands: Neurological: He is alert and oriented to person, place, and time.  Skin: Skin is warm and dry.  Psychiatric: He has a normal mood and affect. Judgment normal.    ED Course  Procedures (including critical care time)  Labs Reviewed - No data to display No results found.   No diagnosis found.    MDM          Worthy Rancher, PA 10/14/11 1356

## 2011-10-25 ENCOUNTER — Emergency Department (HOSPITAL_COMMUNITY)
Admission: EM | Admit: 2011-10-25 | Discharge: 2011-10-25 | Disposition: A | Discharge status: Home / Self Care | Payer: Medicaid Other | Attending: Emergency Medicine | Admitting: Emergency Medicine

## 2011-10-25 ENCOUNTER — Encounter (HOSPITAL_COMMUNITY): Payer: Self-pay

## 2011-10-25 ENCOUNTER — Emergency Department (HOSPITAL_COMMUNITY): Payer: Medicaid Other

## 2011-10-25 DIAGNOSIS — W19XXXA Unspecified fall, initial encounter: Secondary | ICD-10-CM

## 2011-10-25 DIAGNOSIS — W1789XA Other fall from one level to another, initial encounter: Secondary | ICD-10-CM | POA: Insufficient documentation

## 2011-10-25 DIAGNOSIS — F909 Attention-deficit hyperactivity disorder, unspecified type: Secondary | ICD-10-CM | POA: Insufficient documentation

## 2011-10-25 DIAGNOSIS — M549 Dorsalgia, unspecified: Secondary | ICD-10-CM | POA: Insufficient documentation

## 2011-10-25 LAB — URINALYSIS, ROUTINE W REFLEX MICROSCOPIC
Bilirubin Urine: NEGATIVE
Hgb urine dipstick: NEGATIVE
Nitrite: NEGATIVE
Protein, ur: NEGATIVE mg/dL
Urobilinogen, UA: 0.2 mg/dL (ref 0.0–1.0)

## 2011-10-25 MED ORDER — IBUPROFEN 800 MG PO TABS
800.0000 mg | ORAL_TABLET | Freq: Once | ORAL | Status: AC
  Administered 2011-10-25: 800 mg via ORAL
  Filled 2011-10-25: qty 1

## 2011-10-25 MED ORDER — IBUPROFEN 800 MG PO TABS: 800.0000 mg | ORAL_TABLET | Freq: Three times a day (TID) | ORAL | Status: AC

## 2011-10-25 MED ORDER — OXYCODONE-ACETAMINOPHEN 5-325 MG PO TABS: 1.0000 | ORAL_TABLET | ORAL | Status: AC | PRN

## 2011-10-25 MED ORDER — OXYCODONE-ACETAMINOPHEN 5-325 MG PO TABS
2.0000 | ORAL_TABLET | Freq: Once | ORAL | Status: AC
  Administered 2011-10-25: 2 via ORAL
  Filled 2011-10-25: qty 2

## 2011-10-25 NOTE — ED Notes (Signed)
Pt presents with neck, back , and head pain. Pt father reports a fall from 10 ft out of tree. Pt refused to come via EMS. C-Collar applied in triage. No obvious deformity noted. No step-off noted in triage. Pt moving all extremities.

## 2011-10-25 NOTE — ED Notes (Signed)
Family at bedside.  Pt reports improvement in pain level.  Informed pt and family that the physician is waiting on radiology results to determine any further treatment. No needs verbalized.

## 2011-10-25 NOTE — ED Provider Notes (Signed)
History   This chart was scribed for [employer]. Colon Branch, MD by Sofie Rower. The patient was seen in room APA16A/APA16A and the patient's care was started at 3:11PM.    CSN: [id number]  Arrival date & time 10/25/11  1417   First MD Initiated Contact with Patient 10/25/11 1505      Chief Complaint  Patient presents with  . Fall  . Neck Injury  . Back Injury    (Consider location/radiation/quality/duration/timing/severity/associated sxs/prior treatment) HPI  Eugene Hicks is a 21 y.o. male who presents to the Emergency Department complaining of moderate, episodic fall onset today with associated symptoms of back pain. Pt was climbing in a tree when he suddenly fell due to a broken branch. Pt denies LOC, blood in urine. PCP is Dr. Cathren Harsh at [facility].  Past Medical History  Diagnosis Date  . ADHD (attention deficit hyperactivity disorder)     History reviewed. No pertinent past surgical history.  No family history on file.  History  Substance Use Topics  . Smoking status: Never Smoker   . Smokeless tobacco: Not on file  . Alcohol Use: No      Review of Systems  10 Systems reviewed and are negative for acute change except as noted in the HPI.   Allergies  Codeine and Penicillins  Home Medications   Current Outpatient Rx  Name Route Sig Dispense Refill  . ALPRAZOLAM 1 MG PO TABS Oral Take 1 mg by mouth 4 (four) times daily. As directed     . ATOMOXETINE HCL 40 MG PO CAPS Oral Take 40 mg by mouth 2 (two) times daily.      Marland Kitchen FLUTICASONE PROPIONATE 50 MCG/ACT NA SUSP Nasal Place 2 sprays into the nose daily.      Marland Kitchen LITHIUM CARBONATE 300 MG PO CAPS Oral Take 300-600 mg by mouth 2 (two) times daily. Take one capsule (300mg ) every day in the morning, and take two capsules (600mg ) every day at bedtime     . MONTELUKAST SODIUM 10 MG PO TABS Oral Take 10 mg by mouth daily.      Marland Kitchen PROPRANOLOL HCL ER BEADS 80 MG PO CP24 Oral Take 80 mg by mouth at bedtime.        Marland Kitchen QUETIAPINE FUMARATE 300 MG PO TABS Oral Take 300 mg by mouth at bedtime.        BP 120/61  Pulse 86  Temp(Src) 98.2 F (36.8 C) (Oral)  Resp 20  Ht 5\' 8"  (1.727 m)  Wt 130 lb (58.968 kg)  BMI 19.77 kg/m2  SpO2 100%  Physical Exam  Constitutional: He is oriented to person, place, and time. He appears well-developed and well-nourished.  HENT:  Head: Normocephalic and atraumatic.  Right Ear: External ear normal.  Left Ear: External ear normal.  Nose: Nose normal.  Eyes: EOM are normal. Pupils are equal, round, and reactive to light. No scleral icterus.  Neck: Normal range of motion. Neck supple. No thyromegaly present.  Cardiovascular: Normal rate, regular rhythm and normal heart sounds.  Exam reveals no gallop and no friction rub.   No murmur heard. Pulmonary/Chest: Effort normal and breath sounds normal.  Abdominal: Soft. Bowel sounds are normal. He exhibits no distension.  Musculoskeletal: Normal range of motion. He exhibits no edema.       Perispinal muscle tenderness. No spinal tenderness to percussion. No abrasions.  Lymphadenopathy:    He has no cervical adenopathy.  Neurological: He is alert and oriented to person, place,  and time. Coordination normal.  Skin: Skin is warm and dry. No rash noted. No erythema.  Psychiatric: He has a normal mood and affect. His behavior is normal.    ED Course  Procedures (including critical care time)    COORDINATION OF CARE:    Results for orders placed during the hospital encounter of 10/25/11  URINALYSIS, ROUTINE W REFLEX MICROSCOPIC      Component Value Range   Color, Urine YELLOW  YELLOW    APPearance CLEAR  CLEAR    Specific Gravity, Urine 1.010  1.005 - 1.030    pH 6.5  5.0 - 8.0    Glucose, UA NEGATIVE  NEGATIVE (mg/dL)   Hgb urine dipstick NEGATIVE  NEGATIVE    Bilirubin Urine NEGATIVE  NEGATIVE    Ketones, ur NEGATIVE  NEGATIVE (mg/dL)   Protein, ur NEGATIVE  NEGATIVE (mg/dL)   Urobilinogen, UA 0.2  0.0 - 1.0  (mg/dL)   Nitrite NEGATIVE  NEGATIVE    Leukocytes, UA NEGATIVE  NEGATIVE    Dg Cervical Spine Complete  10/25/2011  *RADIOLOGY REPORT*  Clinical Data: Larey Seat.  CERVICAL SPINE - COMPLETE 4+ VIEW  Comparison: None  Findings: The lateral film demonstrates normal alignment of the cervical vertebral bodies.  Disc spaces and vertebral bodies are maintained.  No acute bony findings or abnormal prevertebral soft tissue swelling.  The oblique films demonstrate normally aligned articular facets and patent neural foramen.  The C1-C2 articulations are maintained. The lung apices are clear.  IMPRESSION: Normal alignment and no acute bony findings.  Original Report Authenticated By: P. Loralie Champagne, M.D.   Dg Thoracic Spine 2 View  10/25/2011  *RADIOLOGY REPORT*  Clinical Data: Larey Seat.  Back pain.  THORACIC SPINE - 2 VIEW  Comparison: Lateral chest x-ray 09/05/2011.  Findings: The lateral film demonstrates normal alignment of the thoracic vertebral bodies.  There are mild stable compression deformities without definite acute compression fracture.  On the lateral film I cannot exclude the possibility pedicle fracture involving T10.  CT suggested for further evaluation.  IMPRESSION:  1.  Normal alignment. 2.  Stable mild compression deformities in the mid thoracic spine. 3.  Cannot exclude a T10 pedicle fracture.  Recommend CT scan.  Original Report Authenticated By: P. Loralie Champagne, M.D.   Dg Lumbar Spine Complete  10/25/2011  *RADIOLOGY REPORT*  Clinical Data: Fall from tree, back pain  LUMBAR SPINE - COMPLETE 4+ VIEW  Comparison: CT abdomen pelvis dated 06/06/2007  Findings: Five lumbar-type vertebral bodies.  Normal lumbar lordosis.  No evidence of fracture or dislocation.  Vertebral body heights and intervertebral disc spaces are maintained.  IMPRESSION: Normal lumbar radiographs.  Original Report Authenticated By: Charline Bills, M.D.   Ct Thoracic Spine Wo Contrast  10/25/2011  *RADIOLOGY REPORT*  Clinical Data:  Larey Seat.  Back pain.  CT THORACIC SPINE WITHOUT CONTRAST  Technique:  Multidetector CT imaging of the thoracic spine was performed without intravenous contrast administration. Multiplanar CT image reconstructions were also generated  Comparison: Thoracic spine radiographs, same date.  Findings: Normal alignment of the thoracic vertebral bodies. Slight exaggerated kyphosis, mild anterior wedging of the thoracic vertebral bodies and endplate irregularities suggest Scheuermann's disease.  No acute fracture is identified.  The T10 pedicles appear normal.  No laminar fracture.  The posterior ribs are intact and the visualized lungs are normal.  IMPRESSION:  1.  CT findings consistent with Scheuermann's disease. 2.  No acute fracture.  Original Report Authenticated By: P. Loralie Champagne, M.D.  MDM  Patient fell out of a tree when the limb broke. No LOC. Fell on his back. Plain films negative for acute injury with the exception of a questionable thoracic finding which was investigated with subsequent CT of the thoracic spine. Patient was given analgesics with improvement. Pt feels improved after observation and/or treatment in ED.Pt stable in ED with no significant deterioration in condition.The patient appears reasonably screened and/or stabilized for discharge and I doubt any other medical condition or other [facility] requiring further screening, evaluation, or treatment in the ED at this time prior to discharge.  3:16PM- EDP at bedside discusses treatment plan. 6:13PM- EDP at bedside discusses treatment plan for possible fracture and CT scan.  I personally performed the services described in this documentation, which was scribed in my presence. The recorded information has been reviewed and considered.  MDM Reviewed: previous chart, nursing note and vitals Reviewed previous: x-ray Interpretation: x-ray and CT scan       Nicoletta Dress. Colon Branch, MD 10/27/11 [phone]

## 2011-11-22 ENCOUNTER — Emergency Department (HOSPITAL_COMMUNITY): Payer: 59

## 2011-11-22 ENCOUNTER — Encounter (HOSPITAL_COMMUNITY): Payer: Self-pay

## 2011-11-22 ENCOUNTER — Emergency Department (HOSPITAL_COMMUNITY)
Admission: EM | Admit: 2011-11-22 | Discharge: 2011-11-22 | Disposition: A | Discharge status: Home / Self Care | Payer: 59 | Attending: Emergency Medicine | Admitting: Emergency Medicine

## 2011-11-22 DIAGNOSIS — Z79899 Other long term (current) drug therapy: Secondary | ICD-10-CM | POA: Insufficient documentation

## 2011-11-22 DIAGNOSIS — W1789XA Other fall from one level to another, initial encounter: Secondary | ICD-10-CM | POA: Insufficient documentation

## 2011-11-22 DIAGNOSIS — F909 Attention-deficit hyperactivity disorder, unspecified type: Secondary | ICD-10-CM | POA: Insufficient documentation

## 2011-11-22 DIAGNOSIS — S40029A Contusion of unspecified upper arm, initial encounter: Secondary | ICD-10-CM | POA: Insufficient documentation

## 2011-11-22 MED ORDER — OXYCODONE-ACETAMINOPHEN 5-325 MG PO TABS: 1.0000 | ORAL_TABLET | ORAL | Status: AC | PRN

## 2011-11-22 MED ORDER — NAPROXEN 500 MG PO TABS: 500.0000 mg | ORAL_TABLET | Freq: Two times a day (BID) | ORAL | Status: DC

## 2011-11-22 NOTE — ED Notes (Signed)
Pt fell of of step ladder approx 6 ft and landed on right arm. Pt c/o arm to bicep muscle. No obvious deformity noted.

## 2011-11-24 NOTE — ED Provider Notes (Signed)
History     CSN: [id number]  Arrival date & time 11/22/11  1405   First MD Initiated Contact with Patient 11/22/11 1440      Chief Complaint  Patient presents with  . Fall  . Arm Pain    (Consider location/radiation/quality/duration/timing/severity/associated sxs/prior treatment) Patient is a 21 y.o. male presenting with fall. The history is provided by the patient and a parent. No language interpreter was used.  Fall The accident occurred 3 to 5 hours ago. The fall occurred from a ladder. He fell from a height of 3 to 5 ft. He landed on grass. There was no blood loss. The point of impact was the right shoulder (right upper arm). Pain location: same as above. The pain is moderate. He was ambulatory at the scene. There was no entrapment after the fall. There was no drug use involved in the accident. There was no alcohol use involved in the accident. Pertinent negatives include no numbness, no abdominal pain, no loss of consciousness and no tingling. The symptoms are aggravated by activity, rotation, flexion and use of the injured limb. He has tried nothing for the symptoms. The treatment provided no relief.    Past Medical History  Diagnosis Date  . ADHD (attention deficit hyperactivity disorder)     History reviewed. No pertinent past surgical history.  No family history on file.  History  Substance Use Topics  . Smoking status: Never Smoker   . Smokeless tobacco: Not on file  . Alcohol Use: No      Review of Systems  HENT: Negative for neck pain.   Cardiovascular: Negative for chest pain.  Gastrointestinal: Negative for abdominal pain.  Musculoskeletal: Positive for arthralgias. Negative for myalgias, back pain, joint swelling and gait problem.  Skin: Negative.   Neurological: Negative for dizziness, tingling, loss of consciousness, weakness and numbness.  Hematological: Negative for adenopathy.  All other systems reviewed and are negative.    Allergies  Codeine  and Penicillins  Home Medications   Current Outpatient Rx  Name Route Sig Dispense Refill  . ALPRAZOLAM 1 MG PO TABS Oral Take 1 mg by mouth 4 (four) times daily. As directed     . ATOMOXETINE HCL 40 MG PO CAPS Oral Take 40 mg by mouth 2 (two) times daily.      Marland Kitchen FLUTICASONE PROPIONATE 50 MCG/ACT NA SUSP Nasal Place 2 sprays into the nose daily.      Marland Kitchen LITHIUM CARBONATE 300 MG PO CAPS Oral Take 300-600 mg by mouth 2 (two) times daily. Take one capsule (300mg ) every day in the morning, and take two capsules (600mg ) every day at bedtime     . MONTELUKAST SODIUM 10 MG PO TABS Oral Take 10 mg by mouth daily.      Marland Kitchen NAPROXEN 500 MG PO TABS Oral Take 1 tablet (500 mg total) by mouth 2 (two) times daily. Take with food 20 tablet 0  . OXYCODONE-ACETAMINOPHEN 5-325 MG PO TABS Oral Take 1 tablet by mouth every 4 (four) hours as needed for pain. 15 tablet 0  . PROPRANOLOL HCL ER BEADS 80 MG PO CP24 Oral Take 80 mg by mouth at bedtime.      Marland Kitchen QUETIAPINE FUMARATE 300 MG PO TABS Oral Take 300 mg by mouth at bedtime.        BP 129/75  Pulse 60  Temp(Src) 97.7 F (36.5 C) (Oral)  Resp 20  Ht 5\' 8"  (1.727 m)  Wt 140 lb (63.504 kg)  BMI 21.29  kg/m2  SpO2 100%  Physical Exam  Nursing note and vitals reviewed. Constitutional: He is oriented to person, place, and time. He appears well-developed and well-nourished. No distress.  HENT:  Head: Normocephalic and atraumatic.  Mouth/Throat: Oropharynx is clear and moist.  Neck: Normal range of motion. Neck supple.  Cardiovascular: Normal rate, regular rhythm and normal heart sounds.   Pulmonary/Chest: Effort normal and breath sounds normal. No respiratory distress.  Musculoskeletal: Normal range of motion. He exhibits tenderness. He exhibits no edema.       Right shoulder: He exhibits tenderness and pain. He exhibits normal range of motion, no swelling, no effusion, no crepitus, no deformity, no spasm, normal pulse and normal strength.       Right elbow:  He exhibits normal range of motion. no tenderness found.       Arms: Lymphadenopathy:    He has no cervical adenopathy.  Neurological: He is alert and oriented to person, place, and time. No cranial nerve deficit or sensory deficit. He exhibits normal muscle tone. Coordination and gait normal.  Reflex Scores:      Tricep reflexes are 2+ on the right side and 2+ on the left side.      Bicep reflexes are 2+ on the right side and 2+ on the left side. Skin: Skin is warm and dry.    ED Course  Procedures (including critical care time)   Dg Forearm Right  11/22/2011  *RADIOLOGY REPORT*  Clinical Data: Pain  RIGHT FOREARM - 2 VIEW  Comparison: 11/22/2011  Findings: Intact right radius and ulna.  Normal alignment.  No fracture or soft tissue abnormality.  IMPRESSION: No acute finding  Original Report Authenticated By: Judie Petit. Ruel Favors, M.D.   Dg Humerus Right  11/22/2011  *RADIOLOGY REPORT*  Clinical Data: Fall, pain  RIGHT HUMERUS - 2+ VIEW  Comparison: None.  Findings: Intact right humerus and normal alignment.  No fracture. No focal soft tissue swelling.  No malalignment of the shoulder or elbow.  Visualized right ribs intact.  IMPRESSION: No acute osseous finding  Original Report Authenticated By: Judie Petit. Ruel Favors, M.D.     1. Contusion, arm, upper    Sling applied by nursing staff.  Pain improved.  NV remains intact   MDM    Patient has ttp of the mid upper right arm w/o abrasions, bruising , swelling or deformity.  Bicep tendon appears intact.  Pain is reproduced with ROM .  Radial pulse is brisk, sensation intact.  CR<2 sec.  Likely contusion, but possible rotator cuff injury also possible.        Malikai Gut L. [place], [place] 11/24/11 1555

## 2011-11-26 NOTE — ED Provider Notes (Signed)
Medical screening examination/treatment/procedure(s) were performed by non-physician practitioner and as supervising physician I was immediately available for consultation/collaboration.  Nicoletta Dress. Colon Branch, MD 11/26/11 [phone]

## 2011-12-22 ENCOUNTER — Encounter (HOSPITAL_COMMUNITY): Payer: Self-pay | Admitting: *Deleted

## 2011-12-22 ENCOUNTER — Emergency Department (HOSPITAL_COMMUNITY)
Admission: EM | Admit: 2011-12-22 | Discharge: 2011-12-22 | Discharge status: Against Medical Advice | Payer: Medicaid Other | Attending: Emergency Medicine | Admitting: Emergency Medicine

## 2011-12-22 DIAGNOSIS — R197 Diarrhea, unspecified: Secondary | ICD-10-CM | POA: Insufficient documentation

## 2011-12-22 DIAGNOSIS — Z5320 Procedure and treatment not carried out because of patient's decision for unspecified reasons: Secondary | ICD-10-CM | POA: Insufficient documentation

## 2011-12-22 NOTE — ED Notes (Signed)
No answer

## 2011-12-22 NOTE — ED Notes (Signed)
Diarrhea mult times since last pm. No vomiting. Headache.

## 2011-12-26 ENCOUNTER — Emergency Department (HOSPITAL_COMMUNITY)
Admission: EM | Admit: 2011-12-26 | Discharge: 2011-12-26 | Disposition: A | Discharge status: Home / Self Care | Payer: Medicaid Other | Attending: Emergency Medicine | Admitting: Emergency Medicine

## 2011-12-26 ENCOUNTER — Encounter (HOSPITAL_COMMUNITY): Payer: Self-pay

## 2011-12-26 DIAGNOSIS — R5381 Other malaise: Secondary | ICD-10-CM | POA: Insufficient documentation

## 2011-12-26 DIAGNOSIS — R197 Diarrhea, unspecified: Secondary | ICD-10-CM | POA: Insufficient documentation

## 2011-12-26 DIAGNOSIS — Z79899 Other long term (current) drug therapy: Secondary | ICD-10-CM | POA: Insufficient documentation

## 2011-12-26 DIAGNOSIS — R51 Headache: Secondary | ICD-10-CM | POA: Insufficient documentation

## 2011-12-26 DIAGNOSIS — F909 Attention-deficit hyperactivity disorder, unspecified type: Secondary | ICD-10-CM | POA: Insufficient documentation

## 2011-12-26 MED ORDER — HYDROCODONE-ACETAMINOPHEN 5-325 MG PO TABS
1.0000 | ORAL_TABLET | Freq: Once | ORAL | Status: AC
  Administered 2011-12-26: 1 via ORAL
  Filled 2011-12-26: qty 1

## 2011-12-26 NOTE — ED Provider Notes (Signed)
History     CSN: [id number]  Arrival date & time 12/26/11  1147   First MD Initiated Contact with Patient 12/26/11 1300      Chief Complaint  Patient presents with  . Headache  . Weakness    (Consider location/radiation/quality/duration/timing/severity/associated sxs/prior treatment) HPI Eugene Hicks is a 21 y.o. male who presents to the Emergency Department complaining of headache and concern that he may be dehydrated as a result of a diarrhea illness he had 4 days ago that has resolved. He has been drinking adequately. Is not having diarrhea now. Headache is located across his forehead and has not responded to tylenol.   Past Medical History  Diagnosis Date  . ADHD (attention deficit hyperactivity disorder)     History reviewed. No pertinent past surgical history.  No family history on file.  History  Substance Use Topics  . Smoking status: Never Smoker   . Smokeless tobacco: Not on file  . Alcohol Use: No      Review of Systems A 10 review of systems reviewed and are negative for acute change except as noted in the HPI. Allergies  Codeine and Penicillins  Home Medications   Current Outpatient Rx  Name Route Sig Dispense Refill  . ALPRAZOLAM 1 MG PO TABS Oral Take 1 mg by mouth 4 (four) times daily. As directed     . ATOMOXETINE HCL 40 MG PO CAPS Oral Take 40 mg by mouth 2 (two) times daily.      Marland Kitchen FLUTICASONE PROPIONATE 50 MCG/ACT NA SUSP Nasal Place 2 sprays into the nose daily.      Marland Kitchen LITHIUM CARBONATE 300 MG PO CAPS Oral Take 300-600 mg by mouth 2 (two) times daily. Take one capsule (300mg ) every day in the morning, and take two capsules (600mg ) every day at bedtime     . MONTELUKAST SODIUM 10 MG PO TABS Oral Take 10 mg by mouth daily.      Marland Kitchen NAPROXEN 500 MG PO TABS Oral Take 1 tablet (500 mg total) by mouth 2 (two) times daily. Take with food 20 tablet 0  . PROPRANOLOL HCL ER BEADS 80 MG PO CP24 Oral Take 80 mg by mouth at bedtime.      Marland Kitchen QUETIAPINE  FUMARATE 300 MG PO TABS Oral Take 300 mg by mouth at bedtime.        BP 122/77  Pulse 89  Temp(Src) 98.1 F (36.7 C) (Oral)  Resp 18  Ht 5\' 7"  (1.702 m)  Wt 150 lb (68.04 kg)  BMI 23.49 kg/m2  SpO2 98%  Physical Exam Physical examination:  Nursing notes reviewed; Vital signs and O2 SAT reviewed;  Constitutional: Well developed, Well nourished, Well hydrated, In no acute distress; Head:  Normocephalic, atraumatic; Eyes: EOMI, PERRL, No scleral icterus; ENMT: Mouth and pharynx normal, Mucous membranes moist; Neck: Supple, Full range of motion, No lymphadenopathy; Cardiovascular: Regular rate and rhythm, No murmur, rub, or gallop; Respiratory: Breath sounds clear & equal bilaterally, No rales, rhonchi, wheezes, or rub, Normal respiratory effort/excursion; Chest: Nontender, Movement normal; Abdomen: Soft, Nontender, Nondistended, Normal bowel sounds; Genitourinary: No CVA tenderness; Extremities: Pulses normal, No tenderness, No edema, No calf edema or asymmetry.; Neuro: AA&Ox3, Major CN grossly intact.  No gross focal motor or sensory deficits in extremities.; Skin: Color normal, Warm, Dry  ED Course  Procedures (including critical care time)    MDM  Patient with c/o headache that began today. Worried he may be dehydrated as a result of a recent  diarrhea illness. PE is normal. No signs of dehydration. Given analgesic for headache.Pt stable in ED with no significant deterioration in condition.The patient appears reasonably screened and/or stabilized for discharge and I doubt any other medical condition or other [facility] requiring further screening, evaluation, or treatment in the ED at this time prior to discharge.  MDM Reviewed: nursing note, vitals and previous chart           Nicoletta Dress. Colon Branch, MD 12/26/11 1314

## 2011-12-26 NOTE — ED Notes (Signed)
Patient with no complaints at this time. Respirations even and unlabored. Skin warm/dry. Discharge instructions reviewed with patient at this time. Patient given opportunity to voice concerns/ask questions. Patient discharged at this time and left Emergency Department with steady gait.   

## 2011-12-26 NOTE — Discharge Instructions (Signed)
YOU ARE NOT DEHYDRATED. CONTINUE TO DRINK LOTS OF FLUIDS. YOU MAY USE TYLENOL ALTERNATING WITH MOTRIN FOR YOUR HEADACHE.

## 2011-12-26 NOTE — ED Notes (Signed)
Father reports pt had diarrhea 4 days ago but still feels weak and c/o headache.

## 2012-02-06 ENCOUNTER — Encounter (HOSPITAL_COMMUNITY): Payer: Self-pay | Admitting: *Deleted

## 2012-02-06 ENCOUNTER — Emergency Department (HOSPITAL_COMMUNITY)
Admission: EM | Admit: 2012-02-06 | Discharge: 2012-02-06 | Disposition: A | Discharge status: Home / Self Care | Payer: Medicaid Other | Attending: Emergency Medicine | Admitting: Emergency Medicine

## 2012-02-06 DIAGNOSIS — K0889 Other specified disorders of teeth and supporting structures: Secondary | ICD-10-CM

## 2012-02-06 DIAGNOSIS — K029 Dental caries, unspecified: Secondary | ICD-10-CM | POA: Insufficient documentation

## 2012-02-06 DIAGNOSIS — R220 Localized swelling, mass and lump, head: Secondary | ICD-10-CM | POA: Insufficient documentation

## 2012-02-06 DIAGNOSIS — Z79899 Other long term (current) drug therapy: Secondary | ICD-10-CM | POA: Insufficient documentation

## 2012-02-06 DIAGNOSIS — F909 Attention-deficit hyperactivity disorder, unspecified type: Secondary | ICD-10-CM | POA: Insufficient documentation

## 2012-02-06 DIAGNOSIS — K089 Disorder of teeth and supporting structures, unspecified: Secondary | ICD-10-CM | POA: Insufficient documentation

## 2012-02-06 DIAGNOSIS — R6884 Jaw pain: Secondary | ICD-10-CM | POA: Insufficient documentation

## 2012-02-06 MED ORDER — HYDROCODONE-ACETAMINOPHEN 5-325 MG PO TABS
1.0000 | ORAL_TABLET | Freq: Once | ORAL | Status: AC
  Administered 2012-02-06: 1 via ORAL
  Filled 2012-02-06: qty 1

## 2012-02-06 MED ORDER — HYDROCODONE-ACETAMINOPHEN 5-325 MG PO TABS: 1.0000 | ORAL_TABLET | Freq: Four times a day (QID) | ORAL | Status: AC | PRN

## 2012-02-06 MED ORDER — IBUPROFEN 800 MG PO TABS
800.0000 mg | ORAL_TABLET | Freq: Once | ORAL | Status: AC
  Administered 2012-02-06: 800 mg via ORAL
  Filled 2012-02-06: qty 1

## 2012-02-06 MED ORDER — CLINDAMYCIN HCL 150 MG PO CAPS: ORAL_CAPSULE | ORAL | Status: DC

## 2012-02-06 MED ORDER — CLINDAMYCIN HCL 150 MG PO CAPS
300.0000 mg | ORAL_CAPSULE | Freq: Once | ORAL | Status: AC
  Administered 2012-02-06: 300 mg via ORAL
  Filled 2012-02-06: qty 2

## 2012-02-06 NOTE — ED Notes (Signed)
Pt presents with dental pain.

## 2012-02-06 NOTE — ED Notes (Signed)
Pt DC to home with steady gait 

## 2012-02-06 NOTE — ED Provider Notes (Signed)
History     CSN: [id number]  Arrival date & time 02/06/12  1336   First MD Initiated Contact with Patient 02/06/12 1430      Chief Complaint  Patient presents with  . Dental Pain    (Consider location/radiation/quality/duration/timing/severity/associated sxs/prior treatment) HPI Comments: Has an appt to a dentist on 02-11-12.  Patient is a 22 y.o. male presenting with tooth pain. The history is provided by the patient. No language interpreter was used.  Dental PainThe primary symptoms include mouth pain. Primary symptoms do not include dental injury or fever. The symptoms are unchanged. The symptoms are new. The symptoms occur constantly.  Additional symptoms do not include: jaw pain and facial swelling.    Past Medical History  Diagnosis Date  . ADHD (attention deficit hyperactivity disorder)     History reviewed. No pertinent past surgical history.  No family history on file.  History  Substance Use Topics  . Smoking status: Never Smoker   . Smokeless tobacco: Not on file  . Alcohol Use: No      Review of Systems  Constitutional: Negative for fever.  HENT: Positive for dental problem. Negative for facial swelling and neck pain.   All other systems reviewed and are negative.    Allergies  Codeine and Penicillins  Home Medications   Current Outpatient Rx  Name Route Sig Dispense Refill  . ALPRAZOLAM 1 MG PO TABS Oral Take 1 mg by mouth 4 (four) times daily. As directed     . ATOMOXETINE HCL 40 MG PO CAPS Oral Take 40 mg by mouth 2 (two) times daily.      Marland Kitchen CLINDAMYCIN HCL 150 MG PO CAPS  2 po TID 60 capsule 0  . FLUTICASONE PROPIONATE 50 MCG/ACT NA SUSP Nasal Place 2 sprays into the nose daily.      Marland Kitchen HYDROCODONE-ACETAMINOPHEN 5-325 MG PO TABS Oral Take 1 tablet by mouth every 6 (six) hours as needed for pain. 20 tablet 0  . LITHIUM CARBONATE 300 MG PO CAPS Oral Take 300-600 mg by mouth 2 (two) times daily. Take one capsule (300mg ) every day in the morning,  and take two capsules (600mg ) every day at bedtime     . MONTELUKAST SODIUM 10 MG PO TABS Oral Take 10 mg by mouth daily.      Marland Kitchen NAPROXEN 500 MG PO TABS Oral Take 1 tablet (500 mg total) by mouth 2 (two) times daily. Take with food 20 tablet 0  . PROPRANOLOL HCL ER BEADS 80 MG PO CP24 Oral Take 80 mg by mouth at bedtime.      Marland Kitchen QUETIAPINE FUMARATE 300 MG PO TABS Oral Take 300 mg by mouth at bedtime.        BP 115/73  Pulse 56  Temp(Src) 97.9 F (36.6 C) (Oral)  Resp 20  Ht 5\' 6"  (1.676 m)  Wt 150 lb (68.04 kg)  BMI 24.21 kg/m2  SpO2 100%  Physical Exam  Nursing note and vitals reviewed. Constitutional: He is oriented to person, place, and time. He appears well-developed and well-nourished.  HENT:  Head: Normocephalic and atraumatic.  Right Ear: External ear normal.  Left Ear: External ear normal.  Mouth/Throat: Uvula is midline, oropharynx is clear and moist and mucous membranes are normal. Abnormal dentition. Dental caries present. No uvula swelling.  Eyes: EOM are normal. Pupils are equal, round, and reactive to light.  Neck: Normal range of motion.  Cardiovascular: Normal rate, regular rhythm, normal heart sounds and intact distal pulses.  Pulmonary/Chest: Effort normal and breath sounds normal. No respiratory distress.  Abdominal: Soft. He exhibits no distension. There is no tenderness.  Musculoskeletal: Normal range of motion.  Neurological: He is alert and oriented to person, place, and time.  Skin: Skin is warm and dry.  Psychiatric: He has a normal mood and affect. Judgment normal.    ED Course  Procedures (including critical care time)  Labs Reviewed - No data to display No results found.   1. Pain, dental       MDM  rx- clindamycin 150 mg , 2 po TID. # 60 rx-hydrocodone, 20 OTC ibuprofen  F/u with dr. Rondel Jumbo, PA 02/06/12 1447  Worthy Rancher, [place] 02/06/12 1453

## 2012-02-06 NOTE — Discharge Instructions (Signed)
Dental Pain A tooth ache may be caused by cavities (tooth decay). Cavities expose the nerve of the tooth to air and hot or cold temperatures. It may come from an infection or abscess (also called a boil or furuncle) around your tooth. It is also often caused by dental caries (tooth decay). This causes the pain you are having. DIAGNOSIS  Your caregiver can diagnose this problem by exam. TREATMENT   If caused by an infection, it may be treated with medications which kill germs (antibiotics) and pain medications as prescribed by your caregiver. Take medications as directed.   Only take over-the-counter or prescription medicines for pain, discomfort, or fever as directed by your caregiver.   Whether the tooth ache today is caused by infection or dental disease, you should see your dentist as soon as possible for further care.  SEEK MEDICAL CARE IF: The exam and treatment you received today has been provided on an emergency basis only. This is not a substitute for complete medical or dental care. If your problem worsens or new problems (symptoms) appear, and you are unable to meet with your dentist, call or return to this location. SEEK IMMEDIATE MEDICAL CARE IF:   You have a fever.   You develop redness and swelling of your face, jaw, or neck.   You are unable to open your mouth.   You have severe pain uncontrolled by pain medicine.  MAKE SURE YOU:   Understand these instructions.   Will watch your condition.   Will get help right away if you are not doing well or get worse.  Document Released: 10/05/2005 Document Revised: 09/24/2011 Document Reviewed: 05/23/2008 [facility] Patient Information 2012 [place], [place].   Take the meds as directed.  Take ibuprofen 800 mg every 8 hrs with food.  Follow up with your dentist as planned.

## 2012-02-08 NOTE — ED Provider Notes (Signed)
Medical screening examination/treatment/procedure(s) were performed by non-physician practitioner and as supervising physician I was immediately available for consultation/collaboration.  Geoffery Lyons, MD 02/08/12 0530

## 2012-02-14 ENCOUNTER — Encounter (HOSPITAL_COMMUNITY): Payer: Self-pay | Admitting: *Deleted

## 2012-02-14 ENCOUNTER — Emergency Department (HOSPITAL_COMMUNITY): Payer: Medicaid Other

## 2012-02-14 ENCOUNTER — Emergency Department (HOSPITAL_COMMUNITY)
Admission: EM | Admit: 2012-02-14 | Discharge: 2012-02-14 | Disposition: A | Discharge status: Home / Self Care | Payer: Medicaid Other | Attending: Emergency Medicine | Admitting: Emergency Medicine

## 2012-02-14 DIAGNOSIS — W010XXA Fall on same level from slipping, tripping and stumbling without subsequent striking against object, initial encounter: Secondary | ICD-10-CM | POA: Insufficient documentation

## 2012-02-14 DIAGNOSIS — Z79899 Other long term (current) drug therapy: Secondary | ICD-10-CM | POA: Insufficient documentation

## 2012-02-14 DIAGNOSIS — S0003XA Contusion of scalp, initial encounter: Secondary | ICD-10-CM | POA: Insufficient documentation

## 2012-02-14 MED ORDER — ACETAMINOPHEN 325 MG PO TABS
650.0000 mg | ORAL_TABLET | Freq: Once | ORAL | Status: AC
  Administered 2012-02-14: 650 mg via ORAL
  Filled 2012-02-14: qty 2

## 2012-02-14 NOTE — ED Notes (Signed)
Pt states he slipped on the deck today and fell backwards, hitting head. Unknown LOC. Pt states dizziness

## 2012-02-14 NOTE — ED Notes (Signed)
Father denies LOC when fall occurred.

## 2012-02-14 NOTE — Discharge Instructions (Signed)
Your CT scan did not show anything bad going on in your head. You have a bruise to her scalp. It will be sore for several days. He can apply ice for comfort. Use Tylenol for discomfort.

## 2012-02-14 NOTE — ED Notes (Signed)
Pt DC to home with father.  Pt with steady gait

## 2012-02-14 NOTE — ED Provider Notes (Signed)
History   Scribed for [employer]. Colon Branch, MD, the patient was seen in [facility]. The chart was scribed by Gilman Schmidt. The patients care was started at 4:54 PM.   CSN: [id number]  Arrival date & time 02/14/12  1633   First MD Initiated Contact with Patient 02/14/12 1644      Chief Complaint  Patient presents with  . Head Injury    (Consider location/radiation/quality/duration/timing/severity/associated sxs/prior treatment) HPI Eugene Hicks is a 21 y.o. male with a history of ADHD who presents to the Emergency Department complaining of head injury. Pt states he slipped on the deck today and fell backwards hitting head and landing on porch. Father denies LOC when fall occurred. There are no other associated symptoms and no other alleviating or aggravating factors.    Past Medical History  Diagnosis Date  . ADHD (attention deficit hyperactivity disorder)     History reviewed. No pertinent past surgical history.  No family history on file.  History  Substance Use Topics  . Smoking status: Never Smoker   . Smokeless tobacco: Not on file  . Alcohol Use: No      Review of Systems  Skin:       bruise  Neurological: Negative for syncope.  All other systems reviewed and are negative.    Allergies  Codeine and Penicillins  Home Medications   Current Outpatient Rx  Name Route Sig Dispense Refill  . ALPRAZOLAM 1 MG PO TABS Oral Take 1 mg by mouth 4 (four) times daily. As directed     . ATOMOXETINE HCL 40 MG PO CAPS Oral Take 40 mg by mouth 2 (two) times daily.      Marland Kitchen CLINDAMYCIN HCL 150 MG PO CAPS  2 po TID 60 capsule 0  . FLUTICASONE PROPIONATE 50 MCG/ACT NA SUSP Nasal Place 2 sprays into the nose daily.      Marland Kitchen HYDROCODONE-ACETAMINOPHEN 5-325 MG PO TABS Oral Take 1 tablet by mouth every 6 (six) hours as needed for pain. 20 tablet 0  . LITHIUM CARBONATE 300 MG PO CAPS Oral Take 300-600 mg by mouth 2 (two) times daily. Take one capsule (300mg ) every day in the morning,  and take two capsules (600mg ) every day at bedtime     . MONTELUKAST SODIUM 10 MG PO TABS Oral Take 10 mg by mouth daily.      Marland Kitchen NAPROXEN 500 MG PO TABS Oral Take 1 tablet (500 mg total) by mouth 2 (two) times daily. Take with food 20 tablet 0  . PROPRANOLOL HCL ER BEADS 80 MG PO CP24 Oral Take 80 mg by mouth at bedtime.      Marland Kitchen QUETIAPINE FUMARATE 300 MG PO TABS Oral Take 300 mg by mouth at bedtime.        BP 125/74  Pulse 62  Temp(Src) 97.5 F (36.4 C) (Oral)  Resp 20  Ht 5\' 8"  (1.727 m)  Wt 157 lb (71.215 kg)  BMI 23.87 kg/m2  SpO2 100%  Physical Exam  Constitutional: He appears well-developed and well-nourished.       Pt is at baseline  HENT:  Head: Normocephalic and atraumatic.       Small hematoma to back of head  Eyes: Conjunctivae are normal. Pupils are equal, round, and reactive to light.  Neck: Neck supple. No tracheal deviation present. No thyromegaly present.  Cardiovascular: Normal rate and regular rhythm.   No murmur heard. Pulmonary/Chest: Effort normal and breath sounds normal.  Abdominal: Soft. Bowel sounds are normal.  He exhibits no distension. There is no tenderness.  Musculoskeletal: Normal range of motion. He exhibits no edema and no tenderness.  Neurological: He is alert. Coordination normal.  Skin: Skin is warm and dry. No rash noted.  Psychiatric: He has a normal mood and affect.    ED Course  Procedures (including critical care time)  Labs Reviewed - No data to display No results found.   No diagnosis found.  DIAGNOSTIC STUDIES: Oxygen Saturation is 100% on room air, normal by my interpretation.    Radiology: CT Head Wo Contrast. Reviewed by me. IMPRESSION: 1. No acute or reversible intracranial findings. 2. Chronic sinusitis with worsening since 09/05/2011. Original Report Authenticated By: Gerrianne Scale, M.D.   COORDINATION OF CARE: 4:54pm:  - Patient evaluated by ED physician, CT Head Wo contrast ordered      MDM   Patient  about resuming his head on the ground. There was no LOC. CT of his head does not show any acute findings. He has been given Tylenol.Pt stable in ED with no significant deterioration in condition.The patient appears reasonably screened and/or stabilized for discharge and I doubt any other medical condition or other [facility] requiring further screening, evaluation, or treatment in the ED at this time prior to discharge.  I personally performed the services described in this documentation, which was scribed in my presence. The recorded information has been reviewed and considered.   MDM Reviewed: nursing note and vitals Interpretation: CT scan          Eugene Hicks. Colon Branch, MD 02/14/12 [phone]

## 2012-02-23 ENCOUNTER — Encounter (HOSPITAL_COMMUNITY): Payer: Self-pay | Admitting: *Deleted

## 2012-02-23 ENCOUNTER — Emergency Department (HOSPITAL_COMMUNITY)
Admission: EM | Admit: 2012-02-23 | Discharge: 2012-02-23 | Disposition: A | Discharge status: Home / Self Care | Payer: Medicaid Other | Attending: Emergency Medicine | Admitting: Emergency Medicine

## 2012-02-23 ENCOUNTER — Emergency Department (HOSPITAL_COMMUNITY): Payer: Medicaid Other

## 2012-02-23 DIAGNOSIS — W1809XA Striking against other object with subsequent fall, initial encounter: Secondary | ICD-10-CM | POA: Insufficient documentation

## 2012-02-23 DIAGNOSIS — R51 Headache: Secondary | ICD-10-CM | POA: Insufficient documentation

## 2012-02-23 DIAGNOSIS — S0990XA Unspecified injury of head, initial encounter: Secondary | ICD-10-CM | POA: Insufficient documentation

## 2012-02-23 DIAGNOSIS — F909 Attention-deficit hyperactivity disorder, unspecified type: Secondary | ICD-10-CM | POA: Insufficient documentation

## 2012-02-23 DIAGNOSIS — Z79899 Other long term (current) drug therapy: Secondary | ICD-10-CM | POA: Insufficient documentation

## 2012-02-23 DIAGNOSIS — R404 Transient alteration of awareness: Secondary | ICD-10-CM | POA: Insufficient documentation

## 2012-02-23 NOTE — ED Provider Notes (Signed)
History     CSN: [id number]  Arrival date & time 02/23/12  1309   First MD Initiated Contact with Patient 02/23/12 1325      Chief Complaint  Patient presents with  . Head Injury    (Consider location/radiation/quality/duration/timing/severity/associated sxs/prior treatment) HPI... accidental fall against the bathroom sink. Struck for head. Father reports brief loss of consciousness. No residual neurological deficits. Complains of slight headache. Pain is minimal. Nothing makes symptoms better or worse. Described as sharp.  Past Medical History  Diagnosis Date  . ADHD (attention deficit hyperactivity disorder)     History reviewed. No pertinent past surgical history.  No family history on file.  History  Substance Use Topics  . Smoking status: Never Smoker   . Smokeless tobacco: Not on file  . Alcohol Use: No      Review of Systems  All other systems reviewed and are negative.    Allergies  Codeine and Penicillins  Home Medications   Current Outpatient Rx  Name Route Sig Dispense Refill  . ALPRAZOLAM 1 MG PO TABS Oral Take 1 mg by mouth 4 (four) times daily. As directed     . ATOMOXETINE HCL 40 MG PO CAPS Oral Take 40 mg by mouth 2 (two) times daily.      Marland Kitchen CLINDAMYCIN HCL 150 MG PO CAPS  2 po TID 60 capsule 0  . FLUTICASONE PROPIONATE 50 MCG/ACT NA SUSP Nasal Place 2 sprays into the nose daily.      Marland Kitchen LITHIUM CARBONATE 300 MG PO CAPS Oral Take 300-600 mg by mouth 2 (two) times daily. Take one capsule (300mg ) every day in the morning, and take two capsules (600mg ) every day at bedtime     . MONTELUKAST SODIUM 10 MG PO TABS Oral Take 10 mg by mouth daily.      Marland Kitchen NAPROXEN 500 MG PO TABS Oral Take 1 tablet (500 mg total) by mouth 2 (two) times daily. Take with food 20 tablet 0  . PROPRANOLOL HCL ER BEADS 80 MG PO CP24 Oral Take 80 mg by mouth at bedtime.      Marland Kitchen QUETIAPINE FUMARATE 300 MG PO TABS Oral Take 300 mg by mouth at bedtime.        BP 134/79  Pulse 60   Temp(Src) 97.3 F (36.3 C) (Oral)  Resp 20  Ht 5\' 7"  (1.702 m)  Wt 155 lb (70.308 kg)  BMI 24.28 kg/m2  SpO2 100%  Physical Exam  Nursing note and vitals reviewed. Constitutional: He is oriented to person, place, and time. He appears well-developed and well-nourished.  HENT:  Head: Normocephalic and atraumatic.  Eyes: Conjunctivae and EOM are normal. Pupils are equal, round, and reactive to light.  Neck: Normal range of motion. Neck supple.  Cardiovascular: Normal rate and regular rhythm.   Pulmonary/Chest: Effort normal and breath sounds normal.  Abdominal: Soft. Bowel sounds are normal.  Musculoskeletal: Normal range of motion.  Neurological: He is alert and oriented to person, place, and time.  Skin: Skin is warm and dry.  Psychiatric: He has a normal mood and affect.    ED Course  Procedures (including critical care time)  Labs Reviewed - No data to display Ct Head Wo Contrast  02/23/2012  *RADIOLOGY REPORT*  Clinical Data: Head injury, fall striking head, loss of consciousness  CT HEAD WITHOUT CONTRAST  Technique:  Contiguous axial images were obtained from the base of the skull through the vertex without contrast.  Comparison: 02/14/2012  Findings: Normal ventricular  morphology. No midline shift or mass effect. Normal appearance of brain parenchyma. No intracranial hemorrhage, mass lesion or evidence of acute infarction. No extra-axial fluid collections. Significant opacification of frontal and sphenoid sinuses as well as ethmoid air cells. Skull appears intact.  IMPRESSION: No acute intracranial abnormalities. Persistent sinus disease changes.  Original Report Authenticated By: Lollie Marrow, M.D.     1. Minor head injury       MDM  Head CT negative. No gross neuro deficits.        Donnetta Hutching, MD 02/23/12 [phone]

## 2012-02-23 NOTE — Discharge Instructions (Signed)
X-ray of your head was normal. Tylenol or Motrin for pain. Many of headache for several days.

## 2012-02-23 NOTE — ED Notes (Signed)
Father states he fells against the sink at home, short period of LOC

## 2012-03-31 ENCOUNTER — Encounter (HOSPITAL_COMMUNITY): Payer: Self-pay | Admitting: Emergency Medicine

## 2012-03-31 ENCOUNTER — Emergency Department (HOSPITAL_COMMUNITY)
Admission: EM | Admit: 2012-03-31 | Discharge: 2012-03-31 | Disposition: A | Discharge status: Home / Self Care | Payer: Medicaid Other | Attending: Emergency Medicine | Admitting: Emergency Medicine

## 2012-03-31 DIAGNOSIS — K029 Dental caries, unspecified: Secondary | ICD-10-CM | POA: Insufficient documentation

## 2012-03-31 DIAGNOSIS — F909 Attention-deficit hyperactivity disorder, unspecified type: Secondary | ICD-10-CM | POA: Insufficient documentation

## 2012-03-31 DIAGNOSIS — K0889 Other specified disorders of teeth and supporting structures: Secondary | ICD-10-CM

## 2012-03-31 MED ORDER — HYDROCODONE-ACETAMINOPHEN 5-325 MG PO TABS: ORAL_TABLET | ORAL | Status: DC

## 2012-03-31 MED ORDER — MELOXICAM 7.5 MG PO TABS: ORAL_TABLET | ORAL | Status: DC

## 2012-03-31 MED ORDER — ONDANSETRON HCL 4 MG PO TABS
4.0000 mg | ORAL_TABLET | Freq: Once | ORAL | Status: AC
  Administered 2012-03-31: 4 mg via ORAL
  Filled 2012-03-31: qty 1

## 2012-03-31 MED ORDER — KETOROLAC TROMETHAMINE 10 MG PO TABS
10.0000 mg | ORAL_TABLET | Freq: Once | ORAL | Status: AC
  Administered 2012-03-31: 10 mg via ORAL
  Filled 2012-03-31: qty 1

## 2012-03-31 MED ORDER — HYDROCODONE-ACETAMINOPHEN 5-325 MG PO TABS
2.0000 | ORAL_TABLET | Freq: Once | ORAL | Status: AC
  Administered 2012-03-31: 2 via ORAL
  Filled 2012-03-31: qty 2

## 2012-03-31 NOTE — ED Provider Notes (Signed)
History     CSN: [id number]  Arrival date & time 03/31/12  1429   First MD Initiated Contact with Patient 03/31/12 1555      Chief Complaint  Patient presents with  . Dental Pain    (Consider location/radiation/quality/duration/timing/severity/associated sxs/prior treatment) Patient is a 21 y.o. male presenting with tooth pain. The history is provided by the patient.  Dental PainThe primary symptoms include mouth pain and headaches. Primary symptoms do not include shortness of breath or cough. The symptoms began 3 to 5 days ago. The symptoms are improving. The symptoms occur constantly.  The headache is not associated with photophobia.  Additional symptoms include: dental sensitivity to temperature, gum swelling and gum tenderness. Additional symptoms do not include: trouble swallowing and nosebleeds.    Past Medical History  Diagnosis Date  . ADHD (attention deficit hyperactivity disorder)     History reviewed. No pertinent past surgical history.  History reviewed. No pertinent family history.  History  Substance Use Topics  . Smoking status: Never Smoker   . Smokeless tobacco: Not on file  . Alcohol Use: No      Review of Systems  Constitutional: Negative for activity change.       All ROS Neg except as noted in HPI  HENT: Positive for dental problem. Negative for nosebleeds, trouble swallowing and neck pain.   Eyes: Negative for photophobia and discharge.  Respiratory: Negative for cough, shortness of breath and wheezing.   Cardiovascular: Negative for chest pain and palpitations.  Gastrointestinal: Negative for abdominal pain and blood in stool.  Genitourinary: Negative for dysuria, frequency and hematuria.  Musculoskeletal: Negative for back pain and arthralgias.  Skin: Negative.   Neurological: Positive for headaches. Negative for dizziness, seizures and speech difficulty.  Psychiatric/Behavioral: Negative for hallucinations and confusion.    Allergies    Codeine and Penicillins  Home Medications   Current Outpatient Rx  Name Route Sig Dispense Refill  . ALPRAZOLAM 2 MG PO TABS Oral Take 2 mg by mouth 4 (four) times daily as needed.    Marland Kitchen FLUTICASONE PROPIONATE 50 MCG/ACT NA SUSP Nasal Place 2 sprays into the nose daily.      Marland Kitchen LITHIUM CARBONATE 300 MG PO CAPS Oral Take 300-600 mg by mouth 2 (two) times daily. Take one capsule (300mg ) every day in the morning, and take two capsules (600mg ) every day at bedtime     . PROPRANOLOL HCL ER BEADS 80 MG PO CP24 Oral Take 80 mg by mouth every morning.     Marland Kitchen QUETIAPINE FUMARATE 300 MG PO TABS Oral Take 300 mg by mouth at bedtime.      Marland Kitchen HYDROCODONE-ACETAMINOPHEN 5-325 MG PO TABS  1 or 2 po q4h prn pain, take with food 20 tablet 0  . MELOXICAM 7.5 MG PO TABS  1 po bid with food 12 tablet 0    BP 119/59  Pulse 66  Temp 98.4 F (36.9 C) (Oral)  Resp 16  Ht 5\' 5"  (1.651 m)  Wt 157 lb (71.215 kg)  BMI 26.13 kg/m2  SpO2 100%  Physical Exam  Nursing note and vitals reviewed. Constitutional: He is oriented to person, place, and time. He appears well-developed and well-nourished.  Non-toxic appearance.  HENT:  Head: Normocephalic.  Right Ear: Tympanic membrane and external ear normal.  Left Ear: Tympanic membrane and external ear normal.       Multiple dental caries of the upper and lower gum area. No visible abscess.  Eyes: EOM and lids are  normal. Pupils are equal, round, and reactive to light.  Neck: Normal range of motion. Neck supple. Carotid bruit is not present.  Cardiovascular: Normal rate, regular rhythm, normal heart sounds, intact distal pulses and normal pulses.   Pulmonary/Chest: Breath sounds normal. No respiratory distress.  Abdominal: Soft. Bowel sounds are normal. There is no tenderness. There is no guarding.  Musculoskeletal: Normal range of motion.  Lymphadenopathy:       Head (right side): No submandibular adenopathy present.       Head (left side): No submandibular  adenopathy present.    He has no cervical adenopathy.  Neurological: He is alert and oriented to person, place, and time. He has normal strength. No cranial nerve deficit or sensory deficit.  Skin: Skin is warm and dry.  Psychiatric: He has a normal mood and affect. His speech is normal.    ED Course  Procedures (including critical care time)  Labs Reviewed - No data to display No results found.   1. Toothache       MDM  I have reviewed nursing notes, vital signs, and all appropriate lab and imaging results for this patient. Pt to see a dentist in 2 weeks. Pt has clindamycin at home. Rx for mobic and Norco given to the pt.       Kathie Dike, [place] 03/31/12 [phone]

## 2012-03-31 NOTE — ED Notes (Signed)
Unable to keep appt with dentist due to grandmother passing away per father.

## 2012-03-31 NOTE — Discharge Instructions (Signed)
Dental Pain  A tooth ache may be caused by cavities (tooth decay). Cavities expose the nerve of the tooth to air and hot or cold temperatures. It may come from an infection or abscess (also called a boil or furuncle) around your tooth. It is also often caused by dental caries (tooth decay). This causes the pain you are having.  DIAGNOSIS   Your caregiver can diagnose this problem by exam.  TREATMENT   · If caused by an infection, it may be treated with medications which kill germs (antibiotics) and pain medications as prescribed by your caregiver. Take medications as directed.  · Only take over-the-counter or prescription medicines for pain, discomfort, or fever as directed by your caregiver.  · Whether the tooth ache today is caused by infection or dental disease, you should see your dentist as soon as possible for further care.  SEEK MEDICAL CARE IF:  The exam and treatment you received today has been provided on an emergency basis only. This is not a substitute for complete medical or dental care. If your problem worsens or new problems (symptoms) appear, and you are unable to meet with your dentist, call or return to this location.  SEEK IMMEDIATE MEDICAL CARE IF:   · You have a fever.  · You develop redness and swelling of your face, jaw, or neck.  · You are unable to open your mouth.  · You have severe pain uncontrolled by pain medicine.  MAKE SURE YOU:   · Understand these instructions.  · Will watch your condition.  · Will get help right away if you are not doing well or get worse.  Document Released: 10/05/2005 Document Revised: 09/24/2011 Document Reviewed: 05/23/2008  [employer]® Patient Information ©2012 [employer].

## 2012-04-01 NOTE — ED Provider Notes (Signed)
Medical screening examination/treatment/procedure(s) were performed by non-physician practitioner and as supervising physician I was immediately available for consultation/collaboration.   Molley Houser M Antanasia Kaczynski, DO 04/01/12 1213 

## 2012-04-08 ENCOUNTER — Encounter (HOSPITAL_COMMUNITY): Payer: Self-pay | Admitting: *Deleted

## 2012-04-08 ENCOUNTER — Emergency Department (HOSPITAL_COMMUNITY)
Admission: EM | Admit: 2012-04-08 | Discharge: 2012-04-08 | Disposition: A | Discharge status: Home / Self Care | Payer: Medicaid Other | Attending: Emergency Medicine | Admitting: Emergency Medicine

## 2012-04-08 DIAGNOSIS — K089 Disorder of teeth and supporting structures, unspecified: Secondary | ICD-10-CM | POA: Insufficient documentation

## 2012-04-08 DIAGNOSIS — Z885 Allergy status to narcotic agent status: Secondary | ICD-10-CM | POA: Insufficient documentation

## 2012-04-08 DIAGNOSIS — Z880 Allergy status to penicillin: Secondary | ICD-10-CM | POA: Insufficient documentation

## 2012-04-08 DIAGNOSIS — K0889 Other specified disorders of teeth and supporting structures: Secondary | ICD-10-CM

## 2012-04-08 MED ORDER — HYDROCODONE-ACETAMINOPHEN 5-325 MG PO TABS: 1.0000 | ORAL_TABLET | Freq: Four times a day (QID) | ORAL | Status: AC | PRN

## 2012-04-08 MED ORDER — IBUPROFEN 800 MG PO TABS
800.0000 mg | ORAL_TABLET | Freq: Once | ORAL | Status: AC
  Administered 2012-04-08: 800 mg via ORAL
  Filled 2012-04-08: qty 1

## 2012-04-08 MED ORDER — CLINDAMYCIN HCL 150 MG PO CAPS
300.0000 mg | ORAL_CAPSULE | Freq: Once | ORAL | Status: AC
  Administered 2012-04-08: 300 mg via ORAL
  Filled 2012-04-08: qty 2

## 2012-04-08 MED ORDER — CLINDAMYCIN HCL 150 MG PO CAPS: ORAL_CAPSULE | ORAL | Status: DC

## 2012-04-08 MED ORDER — HYDROCODONE-ACETAMINOPHEN 5-325 MG PO TABS
1.0000 | ORAL_TABLET | Freq: Once | ORAL | Status: AC
  Administered 2012-04-08: 1 via ORAL
  Filled 2012-04-08: qty 1

## 2012-04-08 NOTE — ED Provider Notes (Signed)
Medical screening examination/treatment/procedure(s) were performed by non-physician practitioner and as supervising physician I was immediately available for consultation/collaboration.   Glynn Octave, MD 04/08/12 [phone]

## 2012-04-08 NOTE — ED Notes (Signed)
Dental pain, father says pt had appt with dentist, but when they arrived no one was in the office.

## 2012-04-08 NOTE — ED Provider Notes (Signed)
History     CSN: [id number]  Arrival date & time 04/08/12  1419   First MD Initiated Contact with Patient 04/08/12 1430      Chief Complaint  Patient presents with  . Dental Pain    (Consider location/radiation/quality/duration/timing/severity/associated sxs/prior treatment) HPI Comments: Pt's father states that last week he set up a dental appt for today but the office was closed.  No fever or chills.  Patient is a 21 y.o. male presenting with tooth pain. The history is provided by the patient. No language interpreter was used.  Dental PainThe primary symptoms include mouth pain. Primary symptoms do not include dental injury or fever. The symptoms began more than 1 week ago. The symptoms are worsening.    Past Medical History  Diagnosis Date  . ADHD (attention deficit hyperactivity disorder)     History reviewed. No pertinent past surgical history.  History reviewed. No pertinent family history.  History  Substance Use Topics  . Smoking status: Never Smoker   . Smokeless tobacco: Not on file  . Alcohol Use: No      Review of Systems  Constitutional: Negative for fever.  HENT: Positive for dental problem.   Cardiovascular: Negative for chest pain.  All other systems reviewed and are negative.    Allergies  Codeine and Penicillins  Home Medications   Current Outpatient Rx  Name Route Sig Dispense Refill  . ALPRAZOLAM 2 MG PO TABS Oral Take 2 mg by mouth 4 (four) times daily as needed.    Marland Kitchen FLUTICASONE PROPIONATE 50 MCG/ACT NA SUSP Nasal Place 2 sprays into the nose daily.      Marland Kitchen HYDROCODONE-ACETAMINOPHEN 5-325 MG PO TABS  1 or 2 po q4h prn pain, take with food    . LITHIUM CARBONATE 300 MG PO CAPS Oral Take 300-600 mg by mouth 2 (two) times daily. Take one capsule (300mg ) every day in the morning, and take two capsules (600mg ) every day at bedtime     . MELOXICAM 7.5 MG PO TABS  1 po bid with food    . PROPRANOLOL HCL ER BEADS 80 MG PO CP24 Oral Take 80 mg by  mouth every morning.     Marland Kitchen QUETIAPINE FUMARATE 300 MG PO TABS Oral Take 300 mg by mouth at bedtime.      Marland Kitchen CLINDAMYCIN HCL 150 MG PO CAPS  2 tabs po TID 60 capsule 0  . HYDROCODONE-ACETAMINOPHEN 5-325 MG PO TABS Oral Take 1 tablet by mouth every 6 (six) hours as needed for pain. 20 tablet 0    BP 130/62  Pulse 61  Temp 97.7 F (36.5 C) (Oral)  Resp 20  Ht 5\' 9"  (1.753 m)  Wt 158 lb (71.668 kg)  BMI 23.33 kg/m2  SpO2 100%  Physical Exam  Nursing note and vitals reviewed. Constitutional: He is oriented to person, place, and time. He appears well-developed and well-nourished.  HENT:  Head: Normocephalic and atraumatic.  Mouth/Throat: Uvula is midline, oropharynx is clear and moist and mucous membranes are normal. No uvula swelling.    Eyes: EOM are normal.  Neck: Normal range of motion.  Cardiovascular: Normal rate, regular rhythm, normal heart sounds and intact distal pulses.   Pulmonary/Chest: Effort normal and breath sounds normal. No respiratory distress.  Abdominal: Soft. He exhibits no distension. There is no tenderness.  Musculoskeletal: Normal range of motion.  Neurological: He is alert and oriented to person, place, and time.  Skin: Skin is warm and dry.  Psychiatric: He has  a normal mood and affect. Judgment normal.    ED Course  Procedures (including critical care time)  Labs Reviewed - No data to display No results found.   1. Pain, dental       MDM  rx clindamycin rx- hydrocodone, 2- OTC ibuprofen F/u with dentist of choice        Worthy Rancher, PA 04/08/12 1522

## 2012-04-08 NOTE — Discharge Instructions (Signed)
Dental Pain A tooth ache may be caused by cavities (tooth decay). Cavities expose the nerve of the tooth to air and hot or cold temperatures. It may come from an infection or abscess (also called a boil or furuncle) around your tooth. It is also often caused by dental caries (tooth decay). This causes the pain you are having. DIAGNOSIS  Your caregiver can diagnose this problem by exam. TREATMENT   If caused by an infection, it may be treated with medications which kill germs (antibiotics) and pain medications as prescribed by your caregiver. Take medications as directed.   Only take over-the-counter or prescription medicines for pain, discomfort, or fever as directed by your caregiver.   Whether the tooth ache today is caused by infection or dental disease, you should see your dentist as soon as possible for further care.  SEEK MEDICAL CARE IF: The exam and treatment you received today has been provided on an emergency basis only. This is not a substitute for complete medical or dental care. If your problem worsens or new problems (symptoms) appear, and you are unable to meet with your dentist, call or return to this location. SEEK IMMEDIATE MEDICAL CARE IF:   You have a fever.   You develop redness and swelling of your face, jaw, or neck.   You are unable to open your mouth.   You have severe pain uncontrolled by pain medicine.  MAKE SURE YOU:   Understand these instructions.   Will watch your condition.   Will get help right away if you are not doing well or get worse.  Document Released: 10/05/2005 Document Revised: 09/24/2011 Document Reviewed: 05/23/2008 [facility] Patient Information 2012 [place], [place].   Take the meds as directed.  Take ibuprofen up to 800 mg every 8 hrs with food.  Follow up with the dentist of your choice.

## 2012-04-30 ENCOUNTER — Emergency Department (HOSPITAL_COMMUNITY)
Admission: EM | Admit: 2012-04-30 | Discharge: 2012-04-30 | Disposition: A | Discharge status: Home / Self Care | Payer: Medicaid Other | Attending: Emergency Medicine | Admitting: Emergency Medicine

## 2012-04-30 ENCOUNTER — Emergency Department (HOSPITAL_COMMUNITY): Payer: Medicaid Other

## 2012-04-30 ENCOUNTER — Encounter (HOSPITAL_COMMUNITY): Payer: Self-pay

## 2012-04-30 DIAGNOSIS — R51 Headache: Secondary | ICD-10-CM | POA: Insufficient documentation

## 2012-04-30 DIAGNOSIS — S060XAA Concussion with loss of consciousness status unknown, initial encounter: Secondary | ICD-10-CM | POA: Insufficient documentation

## 2012-04-30 DIAGNOSIS — S060X9A Concussion with loss of consciousness of unspecified duration, initial encounter: Secondary | ICD-10-CM

## 2012-04-30 DIAGNOSIS — R55 Syncope and collapse: Secondary | ICD-10-CM | POA: Insufficient documentation

## 2012-04-30 DIAGNOSIS — L708 Other acne: Secondary | ICD-10-CM | POA: Insufficient documentation

## 2012-04-30 DIAGNOSIS — Z79899 Other long term (current) drug therapy: Secondary | ICD-10-CM | POA: Insufficient documentation

## 2012-04-30 DIAGNOSIS — H538 Other visual disturbances: Secondary | ICD-10-CM | POA: Insufficient documentation

## 2012-04-30 DIAGNOSIS — F909 Attention-deficit hyperactivity disorder, unspecified type: Secondary | ICD-10-CM | POA: Insufficient documentation

## 2012-04-30 DIAGNOSIS — R296 Repeated falls: Secondary | ICD-10-CM | POA: Insufficient documentation

## 2012-04-30 MED ORDER — ETODOLAC 500 MG PO TABS: 500.0000 mg | ORAL_TABLET | Freq: Two times a day (BID) | ORAL | Status: DC

## 2012-04-30 MED ORDER — HYDROCODONE-ACETAMINOPHEN 5-325 MG PO TABS
1.0000 | ORAL_TABLET | Freq: Once | ORAL | Status: AC
  Administered 2012-04-30: 1 via ORAL
  Filled 2012-04-30: qty 1

## 2012-04-30 NOTE — ED Notes (Signed)
Dr.Knapp at bedside  

## 2012-04-30 NOTE — ED Notes (Signed)
Patient with no complaints at this time. Respirations even and unlabored. Skin warm/dry. Discharge instructions reviewed with patient at this time. Patient given opportunity to voice concerns/ask questions. Patient discharged at this time and left Emergency Department with steady gait.   

## 2012-04-30 NOTE — ED Notes (Signed)
Patient sitting up in bed. Moves without difficulty.

## 2012-04-30 NOTE — ED Notes (Signed)
Patient transported to CT 

## 2012-04-30 NOTE — ED Provider Notes (Signed)
History  This chart was scribed for Eugene Kras, MD by Eugene Hicks Day. This patient was seen in room [facility]18/[facility]18 and the patient's care was started at 1414  CSN: [id number] Arrival date & time 04/30/12  1414 First MD Initiated Contact with Patient 04/30/12 1422     CC:  Head injury  The history is provided by the patient and a relative. No language interpreter was used.   Eugene Hicks is a 21 y.o. male who presents to the Emergency Department after falling while stepping out of the shower today with LOC. Father states possibly for 5 minutes. He states that he now is having some blurred vision and tenderness on the front of his forehead where he bumped it.  He denies any other injuries or illnesses at this time. No vomiting.  No numbness or weakness.   Past Medical History  Diagnosis Date  . ADHD (attention deficit hyperactivity disorder)     History reviewed. No pertinent past surgical history.  No family history on file.  History  Substance Use Topics  . Smoking status: Never Smoker   . Smokeless tobacco: Not on file  . Alcohol Use: No      Review of Systems  HENT: Negative for facial swelling.        Tender/Painful on front of his forehead.   All other systems reviewed and are negative.    Allergies  Codeine and Penicillins  Home Medications   Current Outpatient Rx  Name Route Sig Dispense Refill  . ALPRAZOLAM 2 MG PO TABS Oral Take 2 mg by mouth 4 (four) times daily as needed.    Marland Kitchen CLINDAMYCIN HCL 150 MG PO CAPS  2 tabs po TID 60 capsule 0  . FLUTICASONE PROPIONATE 50 MCG/ACT NA SUSP Nasal Place 2 sprays into the nose daily.      Marland Kitchen HYDROCODONE-ACETAMINOPHEN 5-325 MG PO TABS  1 or 2 po q4h prn pain, take with food    . LITHIUM CARBONATE 300 MG PO CAPS Oral Take 300-600 mg by mouth 2 (two) times daily. Take one capsule (300mg ) every day in the morning, and take two capsules (600mg ) every day at bedtime     . MELOXICAM 7.5 MG PO TABS  1 po bid with food    .  PROPRANOLOL HCL ER BEADS 80 MG PO CP24 Oral Take 80 mg by mouth every morning.     Marland Kitchen QUETIAPINE FUMARATE 300 MG PO TABS Oral Take 300 mg by mouth at bedtime.        Triage Vitals: BP 125/84  Temp 98.1 F (36.7 C) (Oral)  Resp 18  Ht 5\' 7"  (1.702 m)  Wt 158 lb (71.668 kg)  BMI 24.75 kg/m2  SpO2 99%  Physical Exam  Nursing note and vitals reviewed. Constitutional: He appears well-developed and well-nourished. No distress.  HENT:  Head: Normocephalic and atraumatic. Head is without raccoon's eyes, without Battle's sign, without abrasion, without contusion, without laceration, without right periorbital erythema and without left periorbital erythema. Hair is normal.  Right Ear: External ear normal.  Left Ear: External ear normal.  Mouth/Throat: No oropharyngeal exudate.       Poor dentition  Eyes: Conjunctivae are normal. Right eye exhibits no discharge. Left eye exhibits no discharge. No scleral icterus.  Neck: Neck supple. No tracheal deviation present.  Cardiovascular: Normal rate, regular rhythm and intact distal pulses.   Pulmonary/Chest: Effort normal and breath sounds normal. No stridor. No respiratory distress. He has no wheezes. He has no  rales.  Abdominal: Soft. Bowel sounds are normal. He exhibits no distension. There is no tenderness. There is no rebound and no guarding.  Musculoskeletal: He exhibits no edema and no tenderness.       Cervical back: Normal.       Thoracic back: Normal.       Lumbar back: Normal.  Neurological: He is alert. He has normal strength. No sensory deficit. Cranial nerve deficit:  no gross defecits noted. He exhibits normal muscle tone. He displays no seizure activity. Coordination normal.  Skin: Skin is warm and dry. No rash noted.       Blackhead acne on face  Psychiatric: He has a normal mood and affect.    ED Course  Procedures (including critical care time) DIAGNOSTIC STUDIES: Oxygen Saturation is 99% on room air, normal by my  interpretation.    COORDINATION OF CARE: At 230 Discussed treatment plan with patient which includes head CT. Patient agrees.   Labs Reviewed - No data to display Ct Head Wo Contrast  04/30/2012  *RADIOLOGY REPORT*  Clinical Data: Fall.  Loss of consciousness.  CT HEAD WITHOUT CONTRAST  Technique:  Contiguous axial images were obtained from the base of the skull through the vertex without contrast.  Comparison: 02/23/2012  Findings: Bone windows demonstrate near complete opacification of ethmoid air cells and frontal sinuses.  Slight improvement in sphenoid sinus mucosal thickening. Clear mastoid air cells.  Soft tissue windows demonstrate no  mass lesion, hemorrhage, hydrocephalus, acute infarct, intra-axial, or extra-axial fluid collection.  IMPRESSION: 1. No acute intracranial abnormality.  2.  Chronic sinus disease.  Original Report Authenticated By: Consuello Bossier, M.D.     MDM  Symptoms consistent with concussion.  No sign of serious injury.  No spinal ttp.  No sign of external injury  I personally performed the services described in this documentation, which was scribed in my presence.  The recorded information has been reviewed and considered.        Eugene Kras, MD 04/30/12 [phone]

## 2012-04-30 NOTE — ED Notes (Signed)
Father reports pt slipped in water and fell hitting head on wall.  Father says pt lost consciousness and is now c/o blurred vision.  Reports has severe headache.

## 2012-06-04 ENCOUNTER — Emergency Department (HOSPITAL_COMMUNITY)
Admission: EM | Admit: 2012-06-04 | Discharge: 2012-06-04 | Disposition: A | Discharge status: Home / Self Care | Payer: Medicaid Other | Attending: Emergency Medicine | Admitting: Emergency Medicine

## 2012-06-04 ENCOUNTER — Emergency Department (HOSPITAL_COMMUNITY): Payer: Medicaid Other

## 2012-06-04 ENCOUNTER — Encounter (HOSPITAL_COMMUNITY): Payer: Self-pay | Admitting: *Deleted

## 2012-06-04 DIAGNOSIS — J069 Acute upper respiratory infection, unspecified: Secondary | ICD-10-CM

## 2012-06-04 DIAGNOSIS — R059 Cough, unspecified: Secondary | ICD-10-CM | POA: Insufficient documentation

## 2012-06-04 DIAGNOSIS — Z79899 Other long term (current) drug therapy: Secondary | ICD-10-CM | POA: Insufficient documentation

## 2012-06-04 DIAGNOSIS — Z885 Allergy status to narcotic agent status: Secondary | ICD-10-CM | POA: Insufficient documentation

## 2012-06-04 DIAGNOSIS — R51 Headache: Secondary | ICD-10-CM | POA: Insufficient documentation

## 2012-06-04 DIAGNOSIS — R05 Cough: Secondary | ICD-10-CM | POA: Insufficient documentation

## 2012-06-04 DIAGNOSIS — Z880 Allergy status to penicillin: Secondary | ICD-10-CM | POA: Insufficient documentation

## 2012-06-04 DIAGNOSIS — F909 Attention-deficit hyperactivity disorder, unspecified type: Secondary | ICD-10-CM | POA: Insufficient documentation

## 2012-06-04 LAB — URINALYSIS, ROUTINE W REFLEX MICROSCOPIC
Glucose, UA: NEGATIVE mg/dL
Leukocytes, UA: NEGATIVE
Protein, ur: NEGATIVE mg/dL
pH: 8 (ref 5.0–8.0)

## 2012-06-04 NOTE — ED Notes (Signed)
Pt and family left dept directly after edp talked to them. Pt not available to sign discharge or receive instructions.

## 2012-06-04 NOTE — ED Notes (Signed)
Pt c/o fever, headache, aching in legs and feeling bad since Thursday. No c/o nausea, vomiting or diarrhea.

## 2012-06-04 NOTE — ED Notes (Signed)
Pt c/o headache and aching to back of left upper leg. Pt states sx since Thursday.

## 2012-06-04 NOTE — ED Provider Notes (Signed)
History  This chart was scribed for Laray Anger, DO by Bennett Scrape. This patient was seen in room [facility]19/[facility]19.  CSN: [id number]  Arrival date & time 06/04/12  8413   First MD Initiated Contact with Patient 06/04/12 0940      Chief Complaint  Patient presents with  . Cough  . Facial Pain     The history is provided by the patient and a parent (father). No language interpreter was used.   Pt was seen at 9:53AM. Eugene Hicks is a 21 y.o. male who presents to the Emergency Department complaining of gradual onset and persistence of constant frontal headache for the past 2 days.  Has been associated with chills, myalgias, cough, nasal congestion and post-nasal drip. He denies taking an OTC medications at home to improve symptoms. He denies having any known sick contacts with similar symptoms. He denies CP/palpitations, no SOB, no abdominal pain, no N/V/D, no sore throat, no neck pain, no back pain, no rash.   PCP is Dr. Lacie Scotts. Past Medical History  Diagnosis Date  . ADHD (attention deficit hyperactivity disorder)     History reviewed. No pertinent past surgical history.   History  Substance Use Topics  . Smoking status: Never Smoker   . Smokeless tobacco: Not on file  . Alcohol Use: No    Review of Systems ROS: Statement: All systems negative except as marked or noted in the HPI; Constitutional: Negative for fever and +chills. ; ; Eyes: Negative for eye pain, redness and discharge. ; ; ENMT: Negative for ear pain, hoarseness, sore throat.  +nasal congestion, sinus pressure. ; ; Cardiovascular: Negative for chest pain, palpitations, diaphoresis, dyspnea and peripheral edema. ; ; Respiratory: +cough. Negative for wheezing and stridor. ; ; Gastrointestinal: Negative for nausea, vomiting, diarrhea, abdominal pain, blood in stool, hematemesis, jaundice and rectal bleeding. . ; ; Genitourinary: Negative for dysuria, flank pain and hematuria. ; ; Musculoskeletal: Negative  for back pain and neck pain. Negative for swelling and trauma.; ; Skin: Negative for pruritus, rash, abrasions, blisters, bruising and skin lesion.; ; Neuro: Negative for headache, lightheadedness and neck stiffness. Negative for weakness, altered level of consciousness , altered mental status, extremity weakness, paresthesias, involuntary movement, seizure and syncope.       Allergies  Codeine and Penicillins  Home Medications   Current Outpatient Rx  Name Route Sig Dispense Refill  . ALPRAZOLAM 2 MG PO TABS Oral Take 2 mg by mouth 4 (four) times daily as needed. For anxiety    . FLUTICASONE PROPIONATE 50 MCG/ACT NA SUSP Nasal Place 2 sprays into the nose daily.      Marland Kitchen LITHIUM CARBONATE 300 MG PO CAPS Oral Take 300-600 mg by mouth 2 (two) times daily. Take one capsule (300mg ) every day in the morning, and take two capsules (600mg ) every day at bedtime    . PROPRANOLOL HCL ER BEADS 80 MG PO CP24 Oral Take 80 mg by mouth daily.     . QUETIAPINE FUMARATE 300 MG PO TABS Oral Take 300 mg by mouth at bedtime.       BP 125/69  Pulse 116  Temp 98 F (36.7 C) (Oral)  Resp 18  Ht 5\' 7"  (1.702 m)  Wt 158 lb (71.668 kg)  BMI 24.75 kg/m2  SpO2 100%  Physical Exam 1000: Physical examination:  Nursing notes reviewed; Vital signs and O2 SAT reviewed;  Constitutional: Well developed, Well nourished, Well hydrated, In no acute distress; Head:  Normocephalic, atraumatic; Eyes: EOMI,  PERRL, No scleral icterus; ENMT: TM's clear bilat.  +edemetous nasal turbinates bilat with clear rhinorrhea. Mouth and pharynx normal, Mucous membranes moist; Neck: Supple, Full range of motion, No lymphadenopathy, no meningeal signs; Cardiovascular: Regular rate and rhythm, No murmur, rub, or gallop; Respiratory: Breath sounds clear & equal bilaterally, No rales, rhonchi, wheezes.  Speaking full sentences with ease, Normal respiratory effort/excursion; Chest: Nontender, Movement normal; Abdomen: Soft, Nontender,  Nondistended, Normal bowel sounds;; Extremities: Pulses normal, No tenderness, No edema, No deformity, No calf edema or asymmetry.; Neuro: AA&Ox3, Major CN grossly intact.  Speech clear. Normal coordination. Gait steady. No gross focal motor or sensory deficits in extremities.; Skin: Color normal, Warm, Dry, no rash.   ED Course  Procedures     MDM  MDM Reviewed: nursing note and vitals Interpretation: labs and x-ray   Results for orders placed during the hospital encounter of 06/04/12  RAPID STREP SCREEN      Component Value Range   Streptococcus, Group A Screen (Direct) NEGATIVE  NEGATIVE  URINALYSIS, ROUTINE W REFLEX MICROSCOPIC      Component Value Range   Color, Urine YELLOW  YELLOW   APPearance CLEAR  CLEAR   Specific Gravity, Urine 1.010  1.005 - 1.030   pH 8.0  5.0 - 8.0   Glucose, UA NEGATIVE  NEGATIVE mg/dL   Hgb urine dipstick NEGATIVE  NEGATIVE   Bilirubin Urine NEGATIVE  NEGATIVE   Ketones, ur NEGATIVE  NEGATIVE mg/dL   Protein, ur NEGATIVE  NEGATIVE mg/dL   Urobilinogen, UA 0.2  0.0 - 1.0 mg/dL   Nitrite NEGATIVE  NEGATIVE   Leukocytes, UA NEGATIVE  NEGATIVE   Dg Chest 2 View 06/04/2012  *RADIOLOGY REPORT*  Clinical Data: Cough  CHEST - 2 VIEW  Comparison: October 25, 2011 and September 05, 2011  Findings: Lungs are clear.  Heart size and pulmonary vascularity are normal.  No adenopathy. There is slight anterior wedging of a mid thoracic vertebral body, stable.    IMPRESSION: Lungs clear.  Original Report Authenticated By: Arvin Collard. WOODRUFF III, M.D.      [phone]:   Pt walking around room, gait steady, resps easy, NAD. No fevers while in ED. Wants to go home now.  Appears URI at this time, tx symptomatically. Dx testing d/w pt and family.  Questions answered.  Verb understanding, agreeable to d/c home with outpt f/u.     I personally performed the services described in this documentation, which was scribed in my presence. The recorded information has been  reviewed and considered. Yanette Tripoli Allison Quarry, DO 06/07/12 1356

## 2012-06-05 LAB — URINE CULTURE: Colony Count: NO GROWTH

## 2012-06-11 ENCOUNTER — Emergency Department (HOSPITAL_COMMUNITY): Payer: Medicaid Other

## 2012-06-11 ENCOUNTER — Emergency Department (HOSPITAL_COMMUNITY)
Admission: EM | Admit: 2012-06-11 | Discharge: 2012-06-11 | Disposition: A | Discharge status: Home / Self Care | Payer: Medicaid Other | Attending: Emergency Medicine | Admitting: Emergency Medicine

## 2012-06-11 ENCOUNTER — Encounter (HOSPITAL_COMMUNITY): Payer: Self-pay

## 2012-06-11 DIAGNOSIS — S61219A Laceration without foreign body of unspecified finger without damage to nail, initial encounter: Secondary | ICD-10-CM

## 2012-06-11 DIAGNOSIS — S61309A Unspecified open wound of unspecified finger with damage to nail, initial encounter: Secondary | ICD-10-CM

## 2012-06-11 DIAGNOSIS — Z23 Encounter for immunization: Secondary | ICD-10-CM | POA: Insufficient documentation

## 2012-06-11 DIAGNOSIS — S61209A Unspecified open wound of unspecified finger without damage to nail, initial encounter: Secondary | ICD-10-CM | POA: Insufficient documentation

## 2012-06-11 DIAGNOSIS — F909 Attention-deficit hyperactivity disorder, unspecified type: Secondary | ICD-10-CM | POA: Insufficient documentation

## 2012-06-11 DIAGNOSIS — W298XXA Contact with other powered powered hand tools and household machinery, initial encounter: Secondary | ICD-10-CM | POA: Insufficient documentation

## 2012-06-11 DIAGNOSIS — Y93H9 Activity, other involving exterior property and land maintenance, building and construction: Secondary | ICD-10-CM | POA: Insufficient documentation

## 2012-06-11 MED ORDER — LIDOCAINE HCL (PF) 2 % IJ SOLN
INTRAMUSCULAR | Status: AC
  Filled 2012-06-11: qty 10

## 2012-06-11 MED ORDER — HYDROMORPHONE HCL PF 1 MG/ML IJ SOLN
0.5000 mg | Freq: Once | INTRAMUSCULAR | Status: DC
  Filled 2012-06-11: qty 1

## 2012-06-11 MED ORDER — TETANUS-DIPHTH-ACELL PERTUSSIS 5-2.5-18.5 LF-MCG/0.5 IM SUSP
0.5000 mL | Freq: Once | INTRAMUSCULAR | Status: AC
  Administered 2012-06-11: 0.5 mL via INTRAMUSCULAR
  Filled 2012-06-11: qty 0.5

## 2012-06-11 MED ORDER — HYDROMORPHONE HCL PF 1 MG/ML IJ SOLN
0.5000 mg | Freq: Once | INTRAMUSCULAR | Status: AC
  Administered 2012-06-11: 0.5 mg via INTRAMUSCULAR

## 2012-06-11 MED ORDER — CIPROFLOXACIN HCL 500 MG PO TABS: 500.0000 mg | ORAL_TABLET | Freq: Two times a day (BID) | ORAL | Status: AC

## 2012-06-11 MED ORDER — LIDOCAINE HCL (PF) 2 % IJ SOLN
10.0000 mL | Freq: Once | INTRAMUSCULAR | Status: AC
  Administered 2012-06-11: 10 mL

## 2012-06-11 MED ORDER — HYDROCODONE-ACETAMINOPHEN 10-500 MG PO TABS: 1.0000 | ORAL_TABLET | ORAL | Status: AC | PRN

## 2012-06-11 MED ORDER — CIPROFLOXACIN HCL 250 MG PO TABS
500.0000 mg | ORAL_TABLET | Freq: Once | ORAL | Status: AC
  Administered 2012-06-11: 500 mg via ORAL
  Filled 2012-06-11: qty 2

## 2012-06-11 MED ORDER — ONDANSETRON HCL 4 MG PO TABS
4.0000 mg | ORAL_TABLET | Freq: Once | ORAL | Status: AC
  Administered 2012-06-11: 4 mg via ORAL
  Filled 2012-06-11: qty 1

## 2012-06-11 MED ORDER — LIDOCAINE HCL (PF) 1 % IJ SOLN
30.0000 mL | Freq: Once | INTRAMUSCULAR | Status: DC
  Filled 2012-06-11: qty 30

## 2012-06-11 MED ORDER — OXYCODONE-ACETAMINOPHEN 5-325 MG PO TABS
2.0000 | ORAL_TABLET | Freq: Once | ORAL | Status: AC
  Administered 2012-06-11: 2 via ORAL
  Filled 2012-06-11: qty 2

## 2012-06-11 MED ORDER — LIDOCAINE HCL (PF) 2 % IJ SOLN
INTRAMUSCULAR | Status: AC
  Administered 2012-06-11: 20:00:00
  Filled 2012-06-11: qty 10

## 2012-06-11 NOTE — ED Notes (Signed)
Ivery Quale, PA added 2 sutures to finger, bleeding controlled, area dressed, NAD noted during procedure.

## 2012-06-11 NOTE — ED Notes (Signed)
Pt was using skill saw and accidentally cut right hand index and middle finger.

## 2012-06-11 NOTE — ED Provider Notes (Signed)
History     CSN: [id number]  Arrival date & time 06/11/12  1628   First MD Initiated Contact with Patient 06/11/12 1641      Chief Complaint  Patient presents with  . Extremity Laceration    (Consider location/radiation/quality/duration/timing/severity/associated sxs/prior treatment) HPI Comments: Patient was working with a Personal assistant saw just prior to coming to the emergency room when he accidentally slipped and cut the second and third fingers of the right hand. The patient was brought to the emergency room by EMS. Bleeding was controlled by applying pressure. The patient was found by EMS to be somewhat pale with a low blood pressure and IV fluids were given to improve this. The patient is unsure of the date of his last tetanus shot. He denies being on any blood thinning type medications. He denies having any bleeding disorders.  The history is provided by the patient and the EMS personnel.    Past Medical History  Diagnosis Date  . ADHD (attention deficit hyperactivity disorder)     History reviewed. No pertinent past surgical history.  No family history on file.  History  Substance Use Topics  . Smoking status: Never Smoker   . Smokeless tobacco: Not on file  . Alcohol Use: No      Review of Systems  Constitutional: Negative for activity change.       All ROS Neg except as noted in HPI  HENT: Negative for nosebleeds and neck pain.   Eyes: Negative for photophobia and discharge.  Respiratory: Negative for cough, shortness of breath and wheezing.   Cardiovascular: Negative for chest pain and palpitations.  Gastrointestinal: Negative for abdominal pain and blood in stool.  Genitourinary: Negative for dysuria, frequency and hematuria.  Musculoskeletal: Negative for back pain and arthralgias.  Skin: Negative.   Neurological: Negative for dizziness, seizures and speech difficulty.  Psychiatric/Behavioral: Negative for hallucinations and confusion.       ADHD     Allergies  Codeine and Penicillins  Home Medications   Current Outpatient Rx  Name Route Sig Dispense Refill  . ALPRAZOLAM 2 MG PO TABS Oral Take 2 mg by mouth 4 (four) times daily as needed. For anxiety    . FLUTICASONE PROPIONATE 50 MCG/ACT NA SUSP Nasal Place 2 sprays into the nose daily.      Marland Kitchen LITHIUM CARBONATE 300 MG PO CAPS Oral Take 300-600 mg by mouth 2 (two) times daily. Take one capsule (300mg ) every day in the morning, and take two capsules (600mg ) every day at bedtime    . PROPRANOLOL HCL ER BEADS 80 MG PO CP24 Oral Take 80 mg by mouth daily.     . QUETIAPINE FUMARATE 300 MG PO TABS Oral Take 300 mg by mouth at bedtime.       BP 120/73  Pulse 68  Temp 98.4 F (36.9 C) (Oral)  Resp 20  Ht 5\' 8"  (1.727 m)  Wt 160 lb (72.576 kg)  BMI 24.33 kg/m2  SpO2 100%  Physical Exam  Nursing note and vitals reviewed. Constitutional: He is oriented to person, place, and time. He appears well-developed and well-nourished.  Non-toxic appearance.  HENT:  Head: Normocephalic.  Right Ear: Tympanic membrane and external ear normal.  Left Ear: Tympanic membrane and external ear normal.  Eyes: EOM and lids are normal. Pupils are equal, round, and reactive to light.  Neck: Normal range of motion. Neck supple. Carotid bruit is not present.  Cardiovascular: Normal rate, regular rhythm, normal heart sounds, intact distal  pulses and normal pulses.   Pulmonary/Chest: Breath sounds normal. No respiratory distress.  Abdominal: Soft. Bowel sounds are normal. There is no tenderness. There is no guarding.  Lymphadenopathy:       Head (right side): No submandibular adenopathy present.       Head (left side): No submandibular adenopathy present.    He has no cervical adenopathy.  Neurological: He is alert and oriented to person, place, and time. He has normal strength. No cranial nerve deficit or sensory deficit.  Skin: Skin is warm and dry.  Psychiatric: He has a normal mood and affect. His  speech is normal.    ED Course  Procedures : LACERATION REPAIR RIGHT 2ND FINGER.- Patient identified by his father and by arm band. Permission for the procedure given by the father. Procedural time out taken before repair of the right second finger laceration. The right hand was painted with Betadine. Digital block of the right second finger was achieved with 2% plain lidocaine. After good anesthesia, the distal second finger was examined. The area was irrigated with saline. The patient has a deep laceration at the base of the nail extending into the medial aspect of the second finger. There is no bone involvement. There is no tendon involvement noted. There is an extensive amount of nail missing as well as avulsion of part of the nail bed. The second finger of the right hand was again painted with Betadine. Using sterile technique 2 interrupted sutures of 3-0 nylon were used in the lateral aspect of the distal finger 2 improve the avulsion divit. The loose fragments of skin and nail were removed cautiously. Following this a nonstick dressing was applied. The patient was also fitted with a finger splint. Patient tolerated the procedure without  problem.  Labs Reviewed - No data to display No results found.   No diagnosis found.    MDM  I have reviewed nursing notes, vital signs, and all appropriate lab and imaging results for this patient. The pt's blood pressure was low and pt pale upon arrival. This was corrected  with IV fluids. The x-ray of the right hand is negative for fracture or dislocation or foreign body. Pt has a deep laceration and avulsion of a portion of the base of the nail and nail bed. The wound was partially closed with 3-0 nylon sutures. Pt to be followed by Dr Romeo Apple as an out patient. Rx for cipro and Norco 10mg  #20 given to the patient. 8:00pm - Called to pt's room because the finger was bleeding again. 2 additional sutures through the nail with 3-0 nylon resolved the  bleeding. Bandage and splint applied by me.     Kathie Dike, PA 06/11/12 1919  Kathie Dike, PA 06/11/12 2020

## 2012-06-11 NOTE — ED Notes (Signed)
Ivery Quale, PA notified of bleeding from finger. Will reassess prior to DC

## 2012-06-11 NOTE — ED Notes (Signed)
IV was removed by friend who was in room, I was unaware. Pain medication given through IM route under [place], [place] order. NAD noted.

## 2012-06-12 NOTE — ED Provider Notes (Signed)
Medical screening examination/treatment/procedure(s) were conducted as a shared visit with non-physician practitioner(s) and myself.  I personally evaluated the patient during the encounter  Laceration to R 2nd and 3rd digits from saw.  Extensor, FDS, FDP, distal sensation intact. Laceration and avulsion through nail bed of second digit with soft tissue loss.  Superficial laceration to proximal phalanx of 3rd digit.  Glynn Octave, MD 06/12/12 [phone]

## 2012-06-13 ENCOUNTER — Telehealth: Payer: Self-pay | Admitting: Orthopedic Surgery

## 2012-06-13 NOTE — Telephone Encounter (Signed)
Patient's father and contact person, Eion Timbrook, [place] # [phone], called to request appointment for patient, following finger injury 06/11/12, treated at [facility] Emergency [facility].  Notes from Emergency Room physician include :   " I have reviewed nursing notes, vital signs, and all appropriate lab and imaging results for this patient. The pt's blood pressure was low and pt pale upon arrival. This was corrected with IV fluids. The x-ray of the right hand is negative for fracture or dislocation or foreign body. Pt has a deep laceration and avulsion of a portion of the base of the nail and nail bed. The wound was partially closed with 3-0 nylon sutures. Pt to be followed by Dr Romeo Apple as an out patient. Rx for cipro and Norco 10mg  #20 given to the patient.  8:00pm - Called to pt's room because the finger was bleeding again. 2 additional sutures through the nail with 3-0 nylon resolved the bleeding. Bandage and splint applied."   Father states patient to have sutures removed in 10 days, which would be 06/21/12.  Patient's insurance is [employer].  Advised about the primary care referral process.  Family will call this office to request, and will call back.

## 2012-06-21 ENCOUNTER — Ambulatory Visit: Payer: Medicaid Other | Admitting: Orthopedic Surgery

## 2012-06-21 ENCOUNTER — Encounter (HOSPITAL_COMMUNITY): Payer: Self-pay | Admitting: Emergency Medicine

## 2012-06-21 ENCOUNTER — Emergency Department (HOSPITAL_COMMUNITY)
Admission: EM | Admit: 2012-06-21 | Discharge: 2012-06-21 | Disposition: A | Discharge status: Home / Self Care | Payer: Medicaid Other | Attending: Physician Assistant | Admitting: Physician Assistant

## 2012-06-21 DIAGNOSIS — F909 Attention-deficit hyperactivity disorder, unspecified type: Secondary | ICD-10-CM | POA: Insufficient documentation

## 2012-06-21 DIAGNOSIS — Z4802 Encounter for removal of sutures: Secondary | ICD-10-CM | POA: Insufficient documentation

## 2012-06-21 DIAGNOSIS — Z5189 Encounter for other specified aftercare: Secondary | ICD-10-CM

## 2012-06-21 MED ORDER — HYDROCODONE-ACETAMINOPHEN 5-325 MG PO TABS: 1.0000 | ORAL_TABLET | ORAL | Status: AC | PRN

## 2012-06-21 NOTE — ED Provider Notes (Signed)
Medical screening examination/treatment/procedure(s) were performed by non-physician practitioner and as supervising physician I was immediately available for consultation/collaboration.  Flint Melter, MD 06/21/12 2023

## 2012-06-21 NOTE — ED Notes (Signed)
Pt came in for removal of stitches

## 2012-06-21 NOTE — ED Provider Notes (Signed)
History     CSN: [id number]  Arrival date & time 06/21/12  0917   None     Chief Complaint  Patient presents with  . Suture / Staple Removal    (Consider location/radiation/quality/duration/timing/severity/associated sxs/prior treatment) Patient is a 21 y.o. male presenting with suture removal. The history is provided by the patient and a relative.  Suture / Staple Removal  The sutures were placed 7 to 10 days ago. Treatments since wound repair include oral antibiotics. His temperature was unmeasured prior to arrival. There has been no drainage from the wound. There is no redness present. There is no swelling present. The pain has improved. He has no difficulty moving the affected extremity or digit.    Past Medical History  Diagnosis Date  . ADHD (attention deficit hyperactivity disorder)     History reviewed. No pertinent past surgical history.  No family history on file.  History  Substance Use Topics  . Smoking status: Never Smoker   . Smokeless tobacco: Not on file  . Alcohol Use: No      Review of Systems  Constitutional: Negative for activity change.       All ROS Neg except as noted in HPI  HENT: Negative for nosebleeds and neck pain.   Eyes: Negative for photophobia and discharge.  Respiratory: Negative for cough, shortness of breath and wheezing.   Cardiovascular: Negative for chest pain and palpitations.  Gastrointestinal: Negative for abdominal pain and blood in stool.  Genitourinary: Negative for dysuria, frequency and hematuria.  Musculoskeletal: Negative for back pain and arthralgias.  Skin: Negative.        laceration  Neurological: Negative for dizziness, seizures and speech difficulty.  Psychiatric/Behavioral: Negative for hallucinations and confusion.    Allergies  Codeine and Penicillins  Home Medications   Current Outpatient Rx  Name Route Sig Dispense Refill  . ALPRAZOLAM 2 MG PO TABS Oral Take 2 mg by mouth 4 (four) times daily as  needed. For anxiety    . CIPROFLOXACIN HCL 500 MG PO TABS Oral Take 1 tablet (500 mg total) by mouth every 12 (twelve) hours. 10 tablet 0  . FLUTICASONE PROPIONATE 50 MCG/ACT NA SUSP Nasal Place 2 sprays into the nose daily.      Marland Kitchen HYDROCODONE-ACETAMINOPHEN 10-500 MG PO TABS Oral Take 1 tablet by mouth every 4 (four) hours as needed for pain. 20 tablet 0  . LITHIUM CARBONATE 300 MG PO CAPS Oral Take 300-600 mg by mouth 2 (two) times daily. Take one capsule (300mg ) every day in the morning, and take two capsules (600mg ) every day at bedtime    . PROPRANOLOL HCL ER BEADS 80 MG PO CP24 Oral Take 80 mg by mouth daily.     . QUETIAPINE FUMARATE 300 MG PO TABS Oral Take 300 mg by mouth at bedtime.     Marland Kitchen HYDROCODONE-ACETAMINOPHEN 5-325 MG PO TABS Oral Take 1 tablet by mouth every 4 (four) hours as needed for pain. 10 tablet 0    BP 115/67  Pulse 95  Temp 98.2 F (36.8 C) (Oral)  Resp 18  Ht 5\' 8"  (1.727 m)  Wt 158 lb (71.668 kg)  BMI 24.02 kg/m2  SpO2 100%  Physical Exam  Nursing note and vitals reviewed. Constitutional: He is oriented to person, place, and time. He appears well-developed and well-nourished.  Non-toxic appearance.  HENT:  Head: Normocephalic.  Right Ear: Tympanic membrane and external ear normal.  Left Ear: Tympanic membrane and external ear normal.  Eyes: EOM  and lids are normal. Pupils are equal, round, and reactive to light.  Neck: Normal range of motion. Neck supple. Carotid bruit is not present.  Cardiovascular: Normal rate, regular rhythm, normal heart sounds, intact distal pulses and normal pulses.   Pulmonary/Chest: Breath sounds normal. No respiratory distress.  Abdominal: Soft. Bowel sounds are normal. There is no tenderness. There is no guarding.  Musculoskeletal: Normal range of motion.       Laceration of the index finger progressing nicely. No drainage. No red streaking. Good ROM. Sutures intact.  Lymphadenopathy:       Head (right side): No submandibular  adenopathy present.       Head (left side): No submandibular adenopathy present.    He has no cervical adenopathy.  Neurological: He is alert and oriented to person, place, and time. He has normal strength. No cranial nerve deficit or sensory deficit.  Skin: Skin is warm and dry.  Psychiatric: He has a normal mood and affect. His speech is normal.    ED Course  Procedures (including critical care time)  Labs Reviewed - No data to display No results found.   1. Visit for wound check       MDM  I have reviewed nursing notes, vital signs, and all appropriate lab and imaging results for this patient. The sutured wound of the index finger is progressing nicely. There is no evidence of infection present. The sutures were removed by me. Dressing was applied by me. Patient advised to keep the wound dressed for the next 4 days. He is to see his primary physician if any changes, problems, or concerns.       Kathie Dike, [place] 06/21/12 [phone]

## 2012-06-21 NOTE — Telephone Encounter (Signed)
We had scheduled patient for appointment today, 06/21/12, for removal of sutures, per above notes, and confirmed with family member (patient's brother) that patient was coming today.  We then received a call from [facility] Emergency department, [place], who relayed that patient was there at the Emergency room, to have this done there.

## 2012-08-03 ENCOUNTER — Emergency Department (HOSPITAL_COMMUNITY)
Admission: EM | Admit: 2012-08-03 | Discharge: 2012-08-03 | Disposition: A | Discharge status: Home / Self Care | Payer: Medicaid Other | Source: Home / Self Care | Attending: Emergency Medicine | Admitting: Emergency Medicine

## 2012-08-03 ENCOUNTER — Encounter (HOSPITAL_COMMUNITY): Payer: Self-pay | Admitting: Emergency Medicine

## 2012-08-03 ENCOUNTER — Emergency Department (HOSPITAL_COMMUNITY)
Admission: EM | Admit: 2012-08-03 | Discharge: 2012-08-03 | Disposition: A | Discharge status: Home / Self Care | Payer: Medicaid Other | Attending: Emergency Medicine | Admitting: Emergency Medicine

## 2012-08-03 DIAGNOSIS — W5911XA Bitten by nonvenomous snake, initial encounter: Secondary | ICD-10-CM

## 2012-08-03 DIAGNOSIS — F909 Attention-deficit hyperactivity disorder, unspecified type: Secondary | ICD-10-CM | POA: Insufficient documentation

## 2012-08-03 DIAGNOSIS — R110 Nausea: Secondary | ICD-10-CM | POA: Insufficient documentation

## 2012-08-03 DIAGNOSIS — T6391XA Toxic effect of contact with unspecified venomous animal, accidental (unintentional), initial encounter: Secondary | ICD-10-CM | POA: Insufficient documentation

## 2012-08-03 DIAGNOSIS — T63001A Toxic effect of unspecified snake venom, accidental (unintentional), initial encounter: Secondary | ICD-10-CM | POA: Insufficient documentation

## 2012-08-03 DIAGNOSIS — T63121A Toxic effect of venom of other venomous lizard, accidental (unintentional), initial encounter: Secondary | ICD-10-CM | POA: Insufficient documentation

## 2012-08-03 DIAGNOSIS — Z760 Encounter for issue of repeat prescription: Secondary | ICD-10-CM

## 2012-08-03 DIAGNOSIS — Z880 Allergy status to penicillin: Secondary | ICD-10-CM | POA: Insufficient documentation

## 2012-08-03 DIAGNOSIS — Z886 Allergy status to analgesic agent status: Secondary | ICD-10-CM | POA: Insufficient documentation

## 2012-08-03 LAB — APTT: aPTT: 31 s (ref 24–37)

## 2012-08-03 LAB — CBC WITH DIFFERENTIAL/PLATELET
Basophils Absolute: 0 10*3/uL (ref 0.0–0.1)
HCT: 35.8 % — ABNORMAL LOW (ref 39.0–52.0)
Hemoglobin: 11.5 g/dL — ABNORMAL LOW (ref 13.0–17.0)
Lymphocytes Relative: 14 % (ref 12–46)
Lymphs Abs: 1.6 10*3/uL (ref 0.7–4.0)
MCV: 63.5 fL — ABNORMAL LOW (ref 78.0–100.0)
Monocytes Absolute: 0.8 10*3/uL (ref 0.1–1.0)
Monocytes Relative: 7 % (ref 3–12)
Neutro Abs: 8.8 10*3/uL — ABNORMAL HIGH (ref 1.7–7.7)
RBC: 5.64 MIL/uL (ref 4.22–5.81)
RDW: 15.4 % (ref 11.5–15.5)
WBC: 11.9 10*3/uL — ABNORMAL HIGH (ref 4.0–10.5)

## 2012-08-03 LAB — BASIC METABOLIC PANEL WITH GFR
BUN: 6 mg/dL (ref 6–23)
CO2: 24 meq/L (ref 19–32)
Calcium: 10 mg/dL (ref 8.4–10.5)
Chloride: 107 meq/L (ref 96–112)
Creatinine, Ser: 0.91 mg/dL (ref 0.50–1.35)
GFR calc Af Amer: >90 mL/min
GFR calc non Af Amer: >90 mL/min
Glucose, Bld: 93 mg/dL (ref 70–99)
Potassium: 3.6 meq/L (ref 3.5–5.1)
Sodium: 138 meq/L (ref 135–145)

## 2012-08-03 LAB — CK: Total CK: 27 U/L (ref 7–232)

## 2012-08-03 LAB — PROTIME-INR: Prothrombin Time: 14.2 seconds (ref 11.6–15.2)

## 2012-08-03 MED ORDER — OXYCODONE-ACETAMINOPHEN 5-325 MG PO TABS
1.0000 | ORAL_TABLET | Freq: Once | ORAL | Status: AC
  Administered 2012-08-03: 1 via ORAL
  Filled 2012-08-03: qty 1

## 2012-08-03 MED ORDER — OXYCODONE-ACETAMINOPHEN 5-325 MG PO TABS: 1.0000 | ORAL_TABLET | ORAL | Status: DC | PRN

## 2012-08-03 NOTE — ED Notes (Signed)
Pt snake bit to r anterior thigh. 2 scabbed over marks noted. States did bleed at first. States snake was brown with black diamonds.  Alert/oriented. No obvious swelling noted. Denies n/v/d.

## 2012-08-03 NOTE — ED Notes (Addendum)
States was seen here earlier today for a suspected snake bite, states the area is more painful and they were told by the physician to return for increased pain.  The area is not red or warm to touch, 2 small abrasions or punctures approx 1.5 inches appart noted to anterior right thigh.  The patient states that the area is tender to touch.

## 2012-08-03 NOTE — ED Provider Notes (Signed)
History     CSN: [id number]  Arrival date & time 08/03/12  2029   First MD Initiated Contact with Patient 08/03/12 2235      Chief Complaint  Patient presents with  . Snake Bite    HPI Eugene Hicks is a 21 y.o. male who presents to the ED for pain management. His father states that the patient was here earlier today and evaluated for a snake bite. He is doing fine except he had a percocet when her was here earlier that helped a lot but now the pain is back. He request more percocet.   Past Medical History  Diagnosis Date  . ADHD (attention deficit hyperactivity disorder)     History reviewed. No pertinent past surgical history.  History reviewed. No pertinent family history.  History  Substance Use Topics  . Smoking status: Never Smoker   . Smokeless tobacco: Not on file  . Alcohol Use: No      Review of Systems  Musculoskeletal:       Pain right thigh at area of bite.    Allergies  Codeine; Lactose intolerance (gi); and Penicillins  Home Medications   Current Outpatient Rx  Name Route Sig Dispense Refill  . ALPRAZOLAM 2 MG PO TABS Oral Take 2 mg by mouth 4 (four) times daily as needed. For anxiety    . FLUTICASONE PROPIONATE 50 MCG/ACT NA SUSP Nasal Place 2 sprays into the nose daily.      Marland Kitchen LITHIUM CARBONATE 300 MG PO CAPS Oral Take 300-600 mg by mouth 2 (two) times daily. Take one capsule (300mg ) every day in the morning, and take two capsules (600mg ) every day at bedtime    . PROPRANOLOL HCL ER BEADS 80 MG PO CP24 Oral Take 80 mg by mouth daily.     . QUETIAPINE FUMARATE 300 MG PO TABS Oral Take 300 mg by mouth at bedtime.       BP 106/58  Pulse 64  Temp 98.1 F (36.7 C) (Oral)  Resp 14  Ht 5\' 7"  (1.702 m)  Wt 140 lb (63.504 kg)  BMI 21.93 kg/m2  SpO2 100%  Physical Exam  Constitutional: He is oriented to person, place, and time. He appears well-developed and well-nourished. No distress.  HENT:  Head: Normocephalic.  Neck: Neck supple.    Cardiovascular: Normal rate.   Pulmonary/Chest: Effort normal.  Musculoskeletal:       Puncture wounds noted right thigh.   Neurological: He is alert and oriented to person, place, and time.  Skin: Skin is warm and dry.   Assessment: 21 y.o. male with snake bite to right thigh, here for pain management  Plan:  Percocet   Follow up with PCP Discussed with the patient and all questioned fully answered.    Medication List     As of 08/03/2012 10:52 PM    START taking these medications         oxyCODONE-acetaminophen 5-325 MG per tablet   Commonly known as: PERCOCET/ROXICET   Take 1 tablet by mouth every 4 (four) hours as needed for pain.      ASK your doctor about these medications         alprazolam 2 MG tablet   Commonly known as: XANAX      fluticasone 50 MCG/ACT nasal spray   Commonly known as: FLONASE      lithium carbonate 300 MG capsule      propranolol 80 MG 24 hr capsule   Commonly known  as: INNOPRAN XL      QUEtiapine 300 MG tablet   Commonly known as: SEROQUEL          Where to get your medications    These are the prescriptions that you need to pick up.   You may get these medications from any pharmacy.         oxyCODONE-acetaminophen 5-325 MG per tablet           ED Course  Procedures         [facility], NP 08/03/12 2252

## 2012-08-03 NOTE — ED Notes (Signed)
Pt with two scabbed possible puncture wound to right upper thigh, states it occurred today about 45 minutes ago, no redness or swelling noted to site

## 2012-08-03 NOTE — ED Provider Notes (Signed)
Medical screening examination/treatment/procedure(s) were performed by non-physician practitioner and as supervising physician I was immediately available for consultation/collaboration. Devoria Albe, MD, Armando Gang   Ward Givens, MD 08/03/12 [phone]

## 2012-08-03 NOTE — ED Notes (Addendum)
Patient states he was bitten by a copperhead today at 1200 and was treated here earlier today. States pain is worse. No redness or swelling noted at triage. Two small scabs noted to upper right leg.

## 2012-08-03 NOTE — ED Provider Notes (Signed)
History  This chart was scribed for Joya Gaskins, MD by Shari Heritage. The patient was seen in room [facility]12/[facility]12. Patient's care was started at 1241.     CSN: [id number]  Arrival date & time 08/03/12  1222   First MD Initiated Contact with Patient 08/03/12 1241      Chief Complaint  Patient presents with  . Snake Bite     Patient is a 21 y.o. male presenting with animal bite. The history is provided by the patient. No language interpreter was used.  Animal Bite  The incident occurred just prior to arrival (snake bite). There is an injury to the right thigh. The pain is moderate. It is unknown if a foreign body is present. Associated symptoms include nausea. Pertinent negatives include no vomiting and no headaches. There have been no prior injuries to these areas. His tetanus status is UTD. He has been behaving normally. There were no sick contacts.    HPI Comments: Eugene Hicks is a 21 y.o. male who presents to the Emergency Department complaining of moderate, constant, non-radiating pain to his right anterior thigh resulting from a snake bite that occurred 1.5 hours ago. There is associated nausea. Patient denies vomiting, HA or swelling. Patient states that he had his pants on when he was bitten. Patient says he was bitten by brown snake patterned with black diamonds. States that he was bitten by a snake that was hiding in his family's vehicle. Last Tdap shot was 1 year ago.   Past Medical History  Diagnosis Date  . ADHD (attention deficit hyperactivity disorder)     History reviewed. No pertinent past surgical history.  History reviewed. No pertinent family history.  History  Substance Use Topics  . Smoking status: Never Smoker   . Smokeless tobacco: Not on file  . Alcohol Use: No      Review of Systems  Gastrointestinal: Positive for nausea. Negative for vomiting.  Skin: Positive for wound.  Neurological: Negative for headaches.  All other systems reviewed  and are negative.    Allergies  Codeine and Penicillins  Home Medications   Current Outpatient Rx  Name Route Sig Dispense Refill  . ALPRAZOLAM 2 MG PO TABS Oral Take 2 mg by mouth 4 (four) times daily as needed. For anxiety    . FLUTICASONE PROPIONATE 50 MCG/ACT NA SUSP Nasal Place 2 sprays into the nose daily.      Marland Kitchen LITHIUM CARBONATE 300 MG PO CAPS Oral Take 300-600 mg by mouth 2 (two) times daily. Take one capsule (300mg ) every day in the morning, and take two capsules (600mg ) every day at bedtime    . PROPRANOLOL HCL ER BEADS 80 MG PO CP24 Oral Take 80 mg by mouth daily.     . QUETIAPINE FUMARATE 300 MG PO TABS Oral Take 300 mg by mouth at bedtime.       BP 118/74  Pulse 75  Temp 97.4 F (36.3 C) (Oral)  Resp 18  Ht 5\' 4"  (1.626 m)  Wt 140 lb (63.504 kg)  BMI 24.03 kg/m2  SpO2 100%  Physical Exam CONSTITUTIONAL: Well developed/well nourished HEAD AND FACE: Normocephalic/atraumatic EYES: EOMI/PERRL ENMT: Mucous membranes moist NECK: supple no meningeal signs SPINE:entire spine nontender CV: S1/S2 noted, no murmurs/rubs/gallops noted LUNGS: Lungs are clear to auscultation bilaterally, no apparent distress ABDOMEN: soft, nontender, no rebound or guarding GU:no cva tenderness NEURO: Pt is awake/alert, moves all extremitiesx4 EXTREMITIES: pulses normal, full ROM. Two small scabs to right thigh. No  bruising, edema or erythema. No crepitance.  Full ROM of  Right knee. SKIN: warm, color normal PSYCH: no abnormalities of mood noted  ED Course  Procedures  DIAGNOSTIC STUDIES: Oxygen Saturation is 100% on room air, normal by my interpretation.    COORDINATION OF CARE: 12:55pm- Patient informed of current plan for treatment and evaluation and agrees with plan at this time.  Pt without any indication for crofab at this time Will monitor, check labs and reassess   2:39 PM Pt without any edema/erythema He has no localized tenderness Labs reassuring Doubt significant  envenomation      MDM  Nursing notes including past medical history and social history reviewed and considered in documentation Labs/vital reviewed and considered       I personally performed the services described in this documentation, which was scribed in my presence. The recorded information has been reviewed and considered.      Joya Gaskins, MD 08/03/12 1440

## 2012-08-05 ENCOUNTER — Encounter (HOSPITAL_COMMUNITY): Payer: Self-pay | Admitting: *Deleted

## 2012-08-05 ENCOUNTER — Emergency Department (HOSPITAL_COMMUNITY)
Admission: EM | Admit: 2012-08-05 | Discharge: 2012-08-05 | Disposition: A | Discharge status: Home / Self Care | Payer: Medicaid Other | Attending: Emergency Medicine | Admitting: Emergency Medicine

## 2012-08-05 DIAGNOSIS — E739 Lactose intolerance, unspecified: Secondary | ICD-10-CM | POA: Insufficient documentation

## 2012-08-05 DIAGNOSIS — Y93E6 Activity, residential relocation: Secondary | ICD-10-CM | POA: Insufficient documentation

## 2012-08-05 DIAGNOSIS — Z885 Allergy status to narcotic agent status: Secondary | ICD-10-CM | POA: Insufficient documentation

## 2012-08-05 DIAGNOSIS — W208XXA Other cause of strike by thrown, projected or falling object, initial encounter: Secondary | ICD-10-CM | POA: Insufficient documentation

## 2012-08-05 DIAGNOSIS — Z880 Allergy status to penicillin: Secondary | ICD-10-CM | POA: Insufficient documentation

## 2012-08-05 DIAGNOSIS — F909 Attention-deficit hyperactivity disorder, unspecified type: Secondary | ICD-10-CM | POA: Insufficient documentation

## 2012-08-05 DIAGNOSIS — S0003XA Contusion of scalp, initial encounter: Secondary | ICD-10-CM | POA: Insufficient documentation

## 2012-08-05 DIAGNOSIS — S1093XA Contusion of unspecified part of neck, initial encounter: Secondary | ICD-10-CM | POA: Insufficient documentation

## 2012-08-05 MED ORDER — IBUPROFEN 800 MG PO TABS
800.0000 mg | ORAL_TABLET | Freq: Once | ORAL | Status: AC
  Administered 2012-08-05: 800 mg via ORAL
  Filled 2012-08-05: qty 1

## 2012-08-05 MED ORDER — HYDROCODONE-ACETAMINOPHEN 5-325 MG PO TABS
1.0000 | ORAL_TABLET | Freq: Once | ORAL | Status: AC
  Administered 2012-08-05: 1 via ORAL
  Filled 2012-08-05: qty 1

## 2012-08-05 MED ORDER — HYDROCODONE-ACETAMINOPHEN 5-325 MG PO TABS: 1.0000 | ORAL_TABLET | Freq: Four times a day (QID) | ORAL | Status: AC | PRN

## 2012-08-05 NOTE — ED Notes (Signed)
Patient with no complaints at this time. Respirations even and unlabored. Skin warm/dry. Discharge instructions reviewed with patient at this time. Patient given opportunity to voice concerns/ask questions. Patient discharged at this time and left Emergency Department with steady gait.   

## 2012-08-05 NOTE — ED Notes (Signed)
Pt was moving a sofa and the leg of the sofa struck pt lt side of head,  Alert, but has c/o dizziness.  No vomiting.

## 2012-08-05 NOTE — ED Provider Notes (Signed)
History     CSN: [id number]  Arrival date & time 08/05/12  1638   First MD Initiated Contact with Patient 08/05/12 1701      Chief Complaint  Patient presents with  . Head Injury    (Consider location/radiation/quality/duration/timing/severity/associated sxs/prior treatment) HPI Comments: Pt and his brother were moving a couch into their house.  Pt had the couch overhead and dropped and the leg struck the side of his head.  No LOC.  Has not taken any pain meds.  Patient is a 21 y.o. male presenting with head injury. The history is provided by the patient and a parent. No language interpreter was used.  Head Injury  The incident occurred 1 to 2 hours ago. He came to the ER via walk-in. The injury mechanism was a direct blow. There was no loss of consciousness. There was no blood loss. The quality of the pain is described as throbbing. The pain is moderate. The pain has been constant since the injury. Pertinent negatives include no blurred vision, no vomiting, no tinnitus, patient does not experience disorientation, no weakness and no memory loss. He has tried nothing for the symptoms.    Past Medical History  Diagnosis Date  . ADHD (attention deficit hyperactivity disorder)     History reviewed. No pertinent past surgical history.  History reviewed. No pertinent family history.  History  Substance Use Topics  . Smoking status: Never Smoker   . Smokeless tobacco: Not on file  . Alcohol Use: No      Review of Systems  HENT: Negative for tinnitus.   Eyes: Negative for blurred vision.  Gastrointestinal: Negative for vomiting.  Skin: Negative for wound.  Neurological: Negative for syncope, weakness and light-headedness.  Psychiatric/Behavioral: Negative for memory loss, behavioral problems, confusion and decreased concentration.  All other systems reviewed and are negative.    Allergies  Codeine; Lactose intolerance (gi); Percocet; and Penicillins  Home Medications    Current Outpatient Rx  Name Route Sig Dispense Refill  . ALPRAZOLAM 2 MG PO TABS Oral Take 2 mg by mouth 4 (four) times daily as needed. For anxiety    . FLUTICASONE PROPIONATE 50 MCG/ACT NA SUSP Nasal Place 2 sprays into the nose daily.      Marland Kitchen HYDROCODONE-ACETAMINOPHEN 5-325 MG PO TABS Oral Take 1 tablet by mouth every 6 (six) hours as needed for pain. 8 tablet 0  . LITHIUM CARBONATE 300 MG PO CAPS Oral Take 300-600 mg by mouth 2 (two) times daily. Take one capsule (300mg ) every day in the morning, and take two capsules (600mg ) every day at bedtime    . OXYCODONE-ACETAMINOPHEN 5-325 MG PO TABS Oral Take 1 tablet by mouth every 4 (four) hours as needed for pain. 6 tablet 0  . PROPRANOLOL HCL ER BEADS 80 MG PO CP24 Oral Take 80 mg by mouth daily.     . QUETIAPINE FUMARATE 300 MG PO TABS Oral Take 300 mg by mouth at bedtime.       BP 116/66  Pulse 50  Temp 97.8 F (36.6 C) (Oral)  Resp 18  Ht 5\' 6"  (1.676 m)  Wt 146 lb (66.225 kg)  BMI 23.56 kg/m2  SpO2 100%  Physical Exam  Nursing note and vitals reviewed. Constitutional: He is oriented to person, place, and time. He appears well-developed and well-nourished.  HENT:  Head: Normocephalic and atraumatic.    Right Ear: External ear normal.  Left Ear: External ear normal.  Eyes: EOM are normal. Pupils are equal,  round, and reactive to light.  Neck: Normal range of motion.  Cardiovascular: Normal rate, regular rhythm and intact distal pulses.   Pulmonary/Chest: Effort normal. No respiratory distress.  Abdominal: Soft. He exhibits no distension. There is no tenderness.  Musculoskeletal: Normal range of motion.  Neurological: He is alert and oriented to person, place, and time. He has normal strength. He is not disoriented. No cranial nerve deficit or sensory deficit. Coordination and gait normal. GCS eye subscore is 4. GCS verbal subscore is 5. GCS motor subscore is 6.  Skin: Skin is warm and dry.  Psychiatric: He has a normal mood  and affect. Judgment normal.    ED Course  Procedures (including critical care time)  Labs Reviewed - No data to display No results found.   1. Scalp contusion       MDM  rx-hydrocodone, 8 Ice F/u with PCP prn        Evalina Field, PA 08/05/12 1812

## 2012-08-06 NOTE — ED Provider Notes (Signed)
Medical screening examination/treatment/procedure(s) were performed by non-physician practitioner and as supervising physician I was immediately available for consultation/collaboration.   Shelda Jakes, MD 08/06/12 1236

## 2012-08-17 ENCOUNTER — Emergency Department (HOSPITAL_COMMUNITY)
Admission: EM | Admit: 2012-08-17 | Discharge: 2012-08-17 | Disposition: A | Discharge status: Home / Self Care | Payer: Medicaid Other | Attending: Emergency Medicine | Admitting: Emergency Medicine

## 2012-08-17 ENCOUNTER — Encounter (HOSPITAL_COMMUNITY): Payer: Self-pay | Admitting: Emergency Medicine

## 2012-08-17 DIAGNOSIS — H6120 Impacted cerumen, unspecified ear: Secondary | ICD-10-CM | POA: Insufficient documentation

## 2012-08-17 DIAGNOSIS — F909 Attention-deficit hyperactivity disorder, unspecified type: Secondary | ICD-10-CM | POA: Insufficient documentation

## 2012-08-17 DIAGNOSIS — R42 Dizziness and giddiness: Secondary | ICD-10-CM | POA: Insufficient documentation

## 2012-08-17 MED ORDER — ANTIPYRINE-BENZOCAINE 5.4-1.4 % OT SOLN
3.0000 [drp] | Freq: Once | OTIC | Status: AC
  Administered 2012-08-17: 4 [drp] via OTIC
  Filled 2012-08-17: qty 10

## 2012-08-17 NOTE — ED Notes (Signed)
Pt reports L ear pain. Parent says pt was cleaning his ear with a Q-tip and probably pushed it in too far.

## 2012-08-17 NOTE — ED Provider Notes (Signed)
History     CSN: [id number]  Arrival date & time 08/17/12  1143   First MD Initiated Contact with Patient 08/17/12 1150      Chief Complaint  Patient presents with  . Otalgia    (Consider location/radiation/quality/duration/timing/severity/associated sxs/prior treatment) HPI Comments: Patient complains of pain to the left ear since yesterday. His father states that he was cleaning his ear with a Q-tip when onset of pain began. Father states that he is concerned that he may have "ruptured his eardrum". Patient complains of mild dizziness and decreased hearing to left ear. He denies sore throat, nasal congestion, fever, headache, neck stiffness or vomiting.  Patient is a 21 y.o. male presenting with ear pain. The history is provided by the patient.  Otalgia This is a new problem. The current episode started yesterday. There is pain in the left ear. The problem occurs constantly. The problem has been gradually worsening. There has been no fever. The pain is moderate. Associated symptoms include hearing loss. Pertinent negatives include no ear discharge, no headaches, no rhinorrhea, no sore throat, no abdominal pain, no diarrhea, no vomiting, no neck pain, no cough and no rash.    Past Medical History  Diagnosis Date  . ADHD (attention deficit hyperactivity disorder)     History reviewed. No pertinent past surgical history.  Family History  Problem Relation Age of Onset  . Stroke Father     History  Substance Use Topics  . Smoking status: Never Smoker   . Smokeless tobacco: Not on file  . Alcohol Use: No      Review of Systems  Constitutional: Negative for activity change and appetite change.  HENT: Positive for hearing loss and ear pain. Negative for sore throat, facial swelling, rhinorrhea, neck pain, dental problem and ear discharge.   Respiratory: Negative for cough.   Gastrointestinal: Negative for vomiting, abdominal pain and diarrhea.  Skin: Negative for rash.    Neurological: Negative for dizziness, syncope, weakness, numbness and headaches.  All other systems reviewed and are negative.    Allergies  Codeine; Lactose intolerance (gi); Percocet; and Penicillins  Home Medications   Current Outpatient Rx  Name Route Sig Dispense Refill  . ALPRAZOLAM 2 MG PO TABS Oral Take 2 mg by mouth 4 (four) times daily as needed. For anxiety    . FLUTICASONE PROPIONATE 50 MCG/ACT NA SUSP Nasal Place 2 sprays into the nose daily.      Marland Kitchen LITHIUM CARBONATE 300 MG PO CAPS Oral Take 300-600 mg by mouth 2 (two) times daily. Take one capsule (300mg ) every day in the morning, and take two capsules (600mg ) every day at bedtime    . OXYCODONE-ACETAMINOPHEN 5-325 MG PO TABS Oral Take 1 tablet by mouth every 4 (four) hours as needed for pain. 6 tablet 0  . PROPRANOLOL HCL ER BEADS 80 MG PO CP24 Oral Take 80 mg by mouth daily.     . QUETIAPINE FUMARATE 300 MG PO TABS Oral Take 300 mg by mouth at bedtime.       BP 117/55  Pulse 63  Temp 97.9 F (36.6 C) (Oral)  Resp 20  Ht 5\' 7"  (1.702 m)  Wt 157 lb (71.215 kg)  BMI 24.59 kg/m2  SpO2 100%  Physical Exam  Nursing note and vitals reviewed. Constitutional: He is oriented to person, place, and time. He appears well-developed and well-nourished. No distress.  HENT:  Head: Normocephalic and atraumatic.  Right Ear: Tympanic membrane and external ear normal.  Left Ear:  External ear normal.  Mouth/Throat: Uvula is midline, oropharynx is clear and moist and mucous membranes are normal.       Left TM is not visualized due to the large amount of cerumen within the ear canal. External ear appears normal. Right TM is visualized and appears normal but there is also cerumen present.  Neck: Normal range of motion. Neck supple.  Cardiovascular: Normal rate, regular rhythm, normal heart sounds and intact distal pulses.   No murmur heard. Pulmonary/Chest: Effort normal and breath sounds normal.  Musculoskeletal: Normal range of  motion.  Lymphadenopathy:    He has no cervical adenopathy.  Neurological: He is alert and oriented to person, place, and time. He exhibits normal muscle tone. Coordination normal.  Skin: Skin is warm and dry.    ED Course  Procedures (including critical care time)  Labs Reviewed - No data to display No results found.      MDM    Left ear was irrigated by the nursing staff.   Patient is feeling better.  Large amt of cerumen removed from the left ear.  Hearing improved. Upon reexamination, Left TM is visualized and appears normal.   Auralgan otic drops dispensed.      Birdia Jaycox L. Aycen Porreca, [place] 08/17/12 1753

## 2012-08-18 NOTE — ED Provider Notes (Signed)
Medical screening examination/treatment/procedure(s) were performed by non-physician practitioner and as supervising physician I was immediately available for consultation/collaboration.  Tayonna Bacha, MD 08/18/12 2355 

## 2012-08-21 ENCOUNTER — Encounter (HOSPITAL_COMMUNITY): Payer: Self-pay | Admitting: *Deleted

## 2012-08-21 ENCOUNTER — Emergency Department (HOSPITAL_COMMUNITY)
Admission: EM | Admit: 2012-08-21 | Discharge: 2012-08-21 | Disposition: A | Discharge status: Home / Self Care | Payer: Medicaid Other | Attending: Emergency Medicine | Admitting: Emergency Medicine

## 2012-08-21 DIAGNOSIS — IMO0002 Reserved for concepts with insufficient information to code with codable children: Secondary | ICD-10-CM | POA: Insufficient documentation

## 2012-08-21 DIAGNOSIS — Z79899 Other long term (current) drug therapy: Secondary | ICD-10-CM | POA: Insufficient documentation

## 2012-08-21 DIAGNOSIS — M5416 Radiculopathy, lumbar region: Secondary | ICD-10-CM

## 2012-08-21 DIAGNOSIS — F909 Attention-deficit hyperactivity disorder, unspecified type: Secondary | ICD-10-CM | POA: Insufficient documentation

## 2012-08-21 HISTORY — DX: Dorsalgia, unspecified: M54.9

## 2012-08-21 MED ORDER — HYDROCODONE-ACETAMINOPHEN 5-325 MG PO TABS: ORAL_TABLET | ORAL | Status: DC

## 2012-08-21 MED ORDER — HYDROCODONE-ACETAMINOPHEN 5-325 MG PO TABS
1.0000 | ORAL_TABLET | Freq: Once | ORAL | Status: AC
  Administered 2012-08-21: 1 via ORAL
  Filled 2012-08-21: qty 1

## 2012-08-21 MED ORDER — CYCLOBENZAPRINE HCL 10 MG PO TABS: 10.0000 mg | ORAL_TABLET | Freq: Three times a day (TID) | ORAL | Status: DC | PRN

## 2012-08-21 NOTE — ED Notes (Signed)
C/o lower back pain (chronic) that radiates down right leg, worse x 3 days

## 2012-08-22 NOTE — ED Provider Notes (Signed)
History     CSN: [id number]  Arrival date & time 08/21/12  1226   First MD Initiated Contact with Patient 08/21/12 1403      Chief Complaint  Patient presents with  . Back Pain    (Consider location/radiation/quality/duration/timing/severity/associated sxs/prior treatment) Patient is a 21 y.o. male presenting with back pain. The history is provided by the patient and a parent.  Back Pain  This is a recurrent problem. The current episode started more than 2 days ago. The problem occurs constantly. The problem has not changed since onset.The pain is associated with lifting heavy objects and twisting. The pain is present in the lumbar spine and sacro-iliac joint. The quality of the pain is described as aching. The pain radiates to the left thigh and left knee. The pain is moderate. The symptoms are aggravated by bending, twisting and certain positions. Associated symptoms include leg pain. Pertinent negatives include no chest pain, no fever, no numbness, no abdominal pain, no abdominal swelling, no bowel incontinence, no perianal numbness, no bladder incontinence, no dysuria, no pelvic pain, no paresthesias, no paresis, no tingling and no weakness. He has tried analgesics for the symptoms. The treatment provided no relief.    Past Medical History  Diagnosis Date  . ADHD (attention deficit hyperactivity disorder)   . Back pain     History reviewed. No pertinent past surgical history.  Family History  Problem Relation Age of Onset  . Stroke Father     History  Substance Use Topics  . Smoking status: Never Smoker   . Smokeless tobacco: Not on file  . Alcohol Use: No      Review of Systems  Constitutional: Negative for fever, activity change and appetite change.  Respiratory: Negative for shortness of breath.   Cardiovascular: Negative for chest pain.  Gastrointestinal: Negative for vomiting, abdominal pain, constipation and bowel incontinence.  Genitourinary: Negative for  bladder incontinence, dysuria, hematuria, flank pain, decreased urine volume, difficulty urinating and pelvic pain.       No perineal numbness or incontinence of urine or feces  Musculoskeletal: Positive for back pain. Negative for joint swelling.  Skin: Negative for rash.  Neurological: Negative for tingling, weakness, numbness and paresthesias.  Psychiatric/Behavioral: Negative for confusion.  All other systems reviewed and are negative.    Allergies  Codeine; Lactose intolerance (gi); Percocet; and Penicillins  Home Medications   Current Outpatient Rx  Name  Route  Sig  Dispense  Refill  . ALPRAZOLAM 2 MG PO TABS   Oral   Take 2 mg by mouth 4 (four) times daily as needed. For anxiety         . FLUTICASONE PROPIONATE 50 MCG/ACT NA SUSP   Nasal   Place 2 sprays into the nose daily.           Marland Kitchen LITHIUM CARBONATE 300 MG PO CAPS   Oral   Take 300-600 mg by mouth 2 (two) times daily. Take one capsule (300mg ) every day in the morning, and take two capsules (600mg ) every day at bedtime         . PROPRANOLOL HCL ER BEADS 80 MG PO CP24   Oral   Take 80 mg by mouth daily.          . QUETIAPINE FUMARATE 300 MG PO TABS   Oral   Take 300 mg by mouth at bedtime.          . CYCLOBENZAPRINE HCL 10 MG PO TABS   Oral  Take 1 tablet (10 mg total) by mouth 3 (three) times daily as needed for muscle spasms.   21 tablet   0   . HYDROCODONE-ACETAMINOPHEN 5-325 MG PO TABS      Take one-two tabs po q 4-6 hrs prn pain   15 tablet   0     BP 106/55  Pulse 60  Temp 98.2 F (36.8 C) (Oral)  Resp 20  Ht 5\' 7"  (1.702 m)  Wt 156 lb (70.761 kg)  BMI 24.43 kg/m2  SpO2 100%  Physical Exam  Nursing note and vitals reviewed. Constitutional: He is oriented to person, place, and time. He appears well-developed and well-nourished. No distress.  HENT:  Head: Normocephalic and atraumatic.  Neck: Normal range of motion. Neck supple.  Cardiovascular: Normal rate, regular rhythm  and intact distal pulses.   No murmur heard. Pulmonary/Chest: Effort normal and breath sounds normal.  Abdominal: Soft. He exhibits no distension. There is no tenderness. There is no rebound.  Musculoskeletal: He exhibits tenderness. He exhibits no edema.       Lumbar back: He exhibits tenderness and pain. He exhibits normal range of motion, no bony tenderness, no swelling, no deformity, no laceration and normal pulse.       Back:       ttp of the left lumbar paraspinal muscles and SI joint.    Neurological: He is alert and oriented to person, place, and time. No cranial nerve deficit or sensory deficit. He exhibits normal muscle tone. Coordination and gait normal.  Reflex Scores:      Patellar reflexes are 2+ on the right side and 2+ on the left side.      Achilles reflexes are 2+ on the right side and 2+ on the left side. Skin: Skin is warm and dry.    ED Course  Procedures (including critical care time)  Labs Reviewed - No data to display No results found.   1. Lumbar radiculopathy       MDM   Previous ED chart reviewed by me.     Patient has ttp of the left lumbar paraspinal muscles and SI joint.  No focal neuro deficits on exam.  Ambulates with a slow but steady gait.   Doubt emergent neurological or infectious process.  Prescribed: Flexeril norco #15      Kayvon Mo L. [place], [place] 08/22/12 2108

## 2012-08-23 NOTE — ED Provider Notes (Signed)
Medical screening examination/treatment/procedure(s) were performed by non-physician practitioner and as supervising physician I was immediately available for consultation/collaboration.   Carleene Cooper III, MD 08/23/12 2139

## 2012-09-03 ENCOUNTER — Emergency Department (HOSPITAL_COMMUNITY)
Admission: EM | Admit: 2012-09-03 | Discharge: 2012-09-03 | Disposition: A | Discharge status: Home / Self Care | Payer: Medicaid Other | Attending: Emergency Medicine | Admitting: Emergency Medicine

## 2012-09-03 ENCOUNTER — Encounter (HOSPITAL_COMMUNITY): Payer: Self-pay

## 2012-09-03 DIAGNOSIS — Z79899 Other long term (current) drug therapy: Secondary | ICD-10-CM | POA: Insufficient documentation

## 2012-09-03 DIAGNOSIS — S46919A Strain of unspecified muscle, fascia and tendon at shoulder and upper arm level, unspecified arm, initial encounter: Secondary | ICD-10-CM

## 2012-09-03 DIAGNOSIS — K089 Disorder of teeth and supporting structures, unspecified: Secondary | ICD-10-CM | POA: Insufficient documentation

## 2012-09-03 DIAGNOSIS — K0889 Other specified disorders of teeth and supporting structures: Secondary | ICD-10-CM

## 2012-09-03 DIAGNOSIS — M25519 Pain in unspecified shoulder: Secondary | ICD-10-CM | POA: Insufficient documentation

## 2012-09-03 DIAGNOSIS — F909 Attention-deficit hyperactivity disorder, unspecified type: Secondary | ICD-10-CM | POA: Insufficient documentation

## 2012-09-03 HISTORY — DX: Pain in unspecified shoulder: M25.519

## 2012-09-03 MED ORDER — HYDROCODONE-ACETAMINOPHEN 5-325 MG PO TABS
1.0000 | ORAL_TABLET | Freq: Once | ORAL | Status: AC
  Administered 2012-09-03: 1 via ORAL
  Filled 2012-09-03: qty 1

## 2012-09-03 MED ORDER — CLINDAMYCIN HCL 300 MG PO CAPS: 300.0000 mg | ORAL_CAPSULE | Freq: Four times a day (QID) | ORAL | Status: DC

## 2012-09-03 MED ORDER — HYDROCODONE-ACETAMINOPHEN 5-325 MG PO TABS: ORAL_TABLET | ORAL | Status: DC

## 2012-09-03 NOTE — ED Notes (Signed)
Pt reports he picked up a heavy log yesterday and felt something pop in r ear.  Pt says now hurts to open mouth.

## 2012-09-03 NOTE — ED Notes (Signed)
Pt c/o rt shoulder, back, head and ear pain since yesterday.

## 2012-09-04 NOTE — ED Provider Notes (Signed)
History     CSN: [id number]  Arrival date & time 09/03/12  1156   First MD Initiated Contact with Patient 09/03/12 1231      Chief Complaint  Patient presents with  . Otalgia    (Consider location/radiation/quality/duration/timing/severity/associated sxs/prior treatment) HPI Comments: Patient c/o pain to the right ear and right shoulder that began after he picked up a heavy log and felt a "pop" in his ear.  He denies neck pain, headache, dizziness, or decreased hearing.  States shoulder is painful with certain movements and improves with rest.  Denies radiation of the pain  Patient is a 21 y.o. male presenting with ear pain. The history is provided by the patient and a relative.  Otalgia This is a recurrent problem. The current episode started 2 days ago. There is pain in the right ear. The problem occurs constantly. The problem has not changed since onset.There has been no fever. The pain is mild. Pertinent negatives include no ear discharge, no headaches, no hearing loss, no sore throat, no abdominal pain, no vomiting, no neck pain, no cough and no rash.    Past Medical History  Diagnosis Date  . ADHD (attention deficit hyperactivity disorder)   . Back pain   . Shoulder pain     History reviewed. No pertinent past surgical history.  Family History  Problem Relation Age of Onset  . Stroke Father     History  Substance Use Topics  . Smoking status: Never Smoker   . Smokeless tobacco: Not on file  . Alcohol Use: No      Review of Systems  Constitutional: Negative for fever and chills.  HENT: Positive for ear pain. Negative for hearing loss, nosebleeds, congestion, sore throat, neck pain, neck stiffness, tinnitus and ear discharge.   Respiratory: Negative for cough.   Gastrointestinal: Negative for vomiting and abdominal pain.  Genitourinary: Negative for dysuria and difficulty urinating.  Musculoskeletal: Positive for joint swelling and arthralgias. Negative for  back pain and gait problem.  Skin: Negative for color change, rash and wound.  Neurological: Negative for dizziness, weakness, numbness and headaches.  All other systems reviewed and are negative.    Allergies  Codeine; Lactose intolerance (gi); Percocet; and Penicillins  Home Medications   Current Outpatient Rx  Name  Route  Sig  Dispense  Refill  . ALPRAZOLAM 2 MG PO TABS   Oral   Take 2 mg by mouth 4 (four) times daily as needed. For anxiety         . CLINDAMYCIN HCL 300 MG PO CAPS   Oral   Take 1 capsule (300 mg total) by mouth 4 (four) times daily.   28 capsule   0   . CYCLOBENZAPRINE HCL 10 MG PO TABS   Oral   Take 1 tablet (10 mg total) by mouth 3 (three) times daily as needed for muscle spasms.   21 tablet   0   . FLUTICASONE PROPIONATE 50 MCG/ACT NA SUSP   Nasal   Place 2 sprays into the nose daily.           Marland Kitchen HYDROCODONE-ACETAMINOPHEN 5-325 MG PO TABS      Take one-two tabs po q 4-6 hrs prn pain   15 tablet   0   . HYDROCODONE-ACETAMINOPHEN 5-325 MG PO TABS      Take one-two tabs po q 4-6 hrs prn pain   8 tablet   0   . LITHIUM CARBONATE 300 MG PO CAPS  Oral   Take 300-600 mg by mouth 2 (two) times daily. Take one capsule (300mg ) every day in the morning, and take two capsules (600mg ) every day at bedtime         . PROPRANOLOL HCL ER BEADS 80 MG PO CP24   Oral   Take 80 mg by mouth daily.          . QUETIAPINE FUMARATE 300 MG PO TABS   Oral   Take 300 mg by mouth at bedtime.            BP 111/63  Pulse 70  Temp 98 F (36.7 C) (Oral)  Resp 20  Ht 5\' 9"  (1.753 m)  Wt 143 lb 8 oz (65.091 kg)  BMI 21.19 kg/m2  SpO2 100%  Physical Exam  Nursing note and vitals reviewed. Constitutional: He is oriented to person, place, and time. He appears well-developed and well-nourished. No distress.  HENT:  Head: Normocephalic and atraumatic. No trismus in the jaw.  Right Ear: Tympanic membrane and ear canal normal. No drainage or  swelling. No foreign bodies. No mastoid tenderness. Tympanic membrane is not perforated. No hemotympanum.  Left Ear: Tympanic membrane and ear canal normal.  Mouth/Throat: Uvula is midline, oropharynx is clear and moist and mucous membranes are normal. Dental caries present. No dental abscesses or uvula swelling.       Pt has widespread dental decay and black discoloration to most of the teeth.  No facial edema or trismus.  ttp of the right lower molars, no obvious abscess  Neck: Normal range of motion. Neck supple.  Cardiovascular: Normal rate, regular rhythm, normal heart sounds and intact distal pulses.   No murmur heard. Pulmonary/Chest: Effort normal and breath sounds normal. No respiratory distress.  Musculoskeletal: Normal range of motion.       Right shoulder: He exhibits tenderness. He exhibits normal range of motion, no bony tenderness, no swelling, no effusion, no crepitus, no deformity, no laceration, no spasm, normal pulse and normal strength.       Arms:      ttp of the right trapezius muscle.  Pt has full ROM of the shoulder joint.  Radial pulse brisk, distal sensation intact, CR< 3 Pray  Lymphadenopathy:    He has no cervical adenopathy.  Neurological: He is alert and oriented to person, place, and time. He exhibits normal muscle tone. Coordination normal.  Skin: Skin is warm and dry.    ED Course  Procedures (including critical care time)  Labs Reviewed - No data to display No results found.   1. Pain, dental   2. Shoulder strain       MDM   Pt has multiple ED visits for various pain complaints.  He is well appearing and appears to be at his neurological baseline today.  Ambulates with a steady gait.  Will start clindamycin, norco #8.  Advised pt's brother that he will need dental f/u         Aaden Buckman L. [place], [place] 09/04/12 2045

## 2012-09-05 ENCOUNTER — Emergency Department (HOSPITAL_COMMUNITY)
Admission: EM | Admit: 2012-09-05 | Discharge: 2012-09-05 | Disposition: A | Discharge status: Home / Self Care | Payer: Medicaid Other | Attending: Emergency Medicine | Admitting: Emergency Medicine

## 2012-09-05 ENCOUNTER — Encounter (HOSPITAL_COMMUNITY): Payer: Self-pay

## 2012-09-05 DIAGNOSIS — Z79899 Other long term (current) drug therapy: Secondary | ICD-10-CM | POA: Insufficient documentation

## 2012-09-05 DIAGNOSIS — S61209A Unspecified open wound of unspecified finger without damage to nail, initial encounter: Secondary | ICD-10-CM | POA: Insufficient documentation

## 2012-09-05 DIAGNOSIS — Y939 Activity, unspecified: Secondary | ICD-10-CM | POA: Insufficient documentation

## 2012-09-05 DIAGNOSIS — W260XXA Contact with knife, initial encounter: Secondary | ICD-10-CM | POA: Insufficient documentation

## 2012-09-05 DIAGNOSIS — S61019A Laceration without foreign body of unspecified thumb without damage to nail, initial encounter: Secondary | ICD-10-CM

## 2012-09-05 DIAGNOSIS — Y929 Unspecified place or not applicable: Secondary | ICD-10-CM | POA: Insufficient documentation

## 2012-09-05 DIAGNOSIS — F909 Attention-deficit hyperactivity disorder, unspecified type: Secondary | ICD-10-CM | POA: Insufficient documentation

## 2012-09-05 NOTE — ED Notes (Signed)
Pt's wound cleansed with NS and Betadine, gauze dressing placed. No active bleeding.

## 2012-09-05 NOTE — ED Notes (Signed)
Pt says was carving arrows for his bow and cut his left thumb with a box cutter.  Bleeding controlled.

## 2012-09-05 NOTE — ED Provider Notes (Signed)
History     CSN: [id number]  Arrival date & time 09/05/12  1007   First MD Initiated Contact with Patient 09/05/12 1056      Chief Complaint  Patient presents with  . Laceration    (Consider location/radiation/quality/duration/timing/severity/associated sxs/prior treatment) HPI Comments: Patient arrived by EMS to the ED with c/o laceration tot eh left thumb that occurred while using a knife.  Patient c/o pain to his left thumb.  Bleeding controlled after applying pressure.  Pt denies numbness or decreased ROM of the finger.  TDaP is UTD  Patient is a 21 y.o. male presenting with skin laceration. The history is provided by the patient.  Laceration  The incident occurred 1 to 2 hours ago. Pain location: left thumb. The laceration is 1 cm in size. The laceration mechanism was a a clean knife. The pain is mild. The pain has been constant since onset. He reports no foreign bodies present. His tetanus status is UTD.    Past Medical History  Diagnosis Date  . ADHD (attention deficit hyperactivity disorder)   . Back pain   . Shoulder pain     History reviewed. No pertinent past surgical history.  Family History  Problem Relation Age of Onset  . Stroke Father     History  Substance Use Topics  . Smoking status: Never Smoker   . Smokeless tobacco: Not on file  . Alcohol Use: No      Review of Systems  Constitutional: Negative for fever and chills.  Genitourinary: Negative for dysuria and difficulty urinating.  Musculoskeletal: Positive for joint swelling and arthralgias.  Skin: Positive for wound. Negative for color change.  All other systems reviewed and are negative.    Allergies  Codeine; Lactose intolerance (gi); Percocet; and Penicillins  Home Medications   Current Outpatient Rx  Name  Route  Sig  Dispense  Refill  . ALPRAZOLAM 2 MG PO TABS   Oral   Take 2 mg by mouth 4 (four) times daily as needed. For anxiety         . CYCLOBENZAPRINE HCL 10 MG PO  TABS   Oral   Take 1 tablet (10 mg total) by mouth 3 (three) times daily as needed for muscle spasms.   21 tablet   0   . FLUTICASONE PROPIONATE 50 MCG/ACT NA SUSP   Nasal   Place 2 sprays into the nose daily.           Marland Kitchen HYDROCODONE-ACETAMINOPHEN 5-325 MG PO TABS   Oral   Take 1 tablet by mouth every 4 (four) hours as needed. Pain         . LITHIUM CARBONATE 300 MG PO CAPS   Oral   Take 300-600 mg by mouth 2 (two) times daily. Take one capsule (300mg ) every day in the morning, and take two capsules (600mg ) every day at bedtime         . PROPRANOLOL HCL ER BEADS 80 MG PO CP24   Oral   Take 80 mg by mouth daily.          . QUETIAPINE FUMARATE 300 MG PO TABS   Oral   Take 300 mg by mouth at bedtime.            BP 126/61  Pulse 69  Temp 97.9 F (36.6 C) (Oral)  Resp 20  Ht 5\' 8"  (1.727 m)  Wt 153 lb (69.4 kg)  BMI 23.26 kg/m2  SpO2 98%  Physical Exam  Nursing note  and vitals reviewed. Constitutional: He is oriented to person, place, and time. He appears well-developed and well-nourished. No distress.  HENT:  Head: Normocephalic and atraumatic.  Cardiovascular: Normal rate, regular rhythm and normal heart sounds.   Pulmonary/Chest: Effort normal and breath sounds normal.  Musculoskeletal: Normal range of motion. He exhibits no edema and no tenderness.       Left hand: He exhibits tenderness and laceration. He exhibits normal range of motion, no bony tenderness, normal two-point discrimination, normal capillary refill, no deformity and no swelling. normal sensation noted. Normal strength noted.       Hands:      1 cm superficial circular avulsion of the skin overlying the distal joint of the left thumb.  ROM is preserved,  Distal sensation intact, CR< 3 sec.  Radial pulse brisk, bleeding controlled   Neurological: He is alert and oriented to person, place, and time. He exhibits normal muscle tone. Coordination normal.  Skin: Laceration noted.       See MS exam     ED Course  Procedures (including critical care time)  Labs Reviewed - No data to display No results found.    Xeroform dressing and finger splint applied.    MDM    Small 1 cm superficial skin avulsion to the left distal thumb, bleeding controlled.  Suture repair is not needed.  Td is UTD.        Atharv Barriere L. [place], [place] 09/06/12 2147

## 2012-09-05 NOTE — ED Provider Notes (Signed)
Medical screening examination/treatment/procedure(s) were performed by non-physician practitioner and as supervising physician I was immediately available for consultation/collaboration.   Shelda Jakes, MD 09/05/12 [place]ers

## 2012-09-08 NOTE — ED Provider Notes (Signed)
Medical screening examination/treatment/procedure(s) were performed by non-physician practitioner and as supervising physician I was immediately available for consultation/collaboration. Devoria Albe, MD, [facility]   Ward Givens, MD 09/08/12 [phone]

## 2012-09-19 ENCOUNTER — Emergency Department (HOSPITAL_COMMUNITY)
Admission: EM | Admit: 2012-09-19 | Discharge: 2012-09-19 | Disposition: A | Discharge status: Home / Self Care | Payer: Medicaid Other | Attending: Emergency Medicine | Admitting: Emergency Medicine

## 2012-09-19 ENCOUNTER — Encounter (HOSPITAL_COMMUNITY): Payer: Self-pay | Admitting: *Deleted

## 2012-09-19 DIAGNOSIS — Z79899 Other long term (current) drug therapy: Secondary | ICD-10-CM | POA: Insufficient documentation

## 2012-09-19 DIAGNOSIS — K089 Disorder of teeth and supporting structures, unspecified: Secondary | ICD-10-CM | POA: Insufficient documentation

## 2012-09-19 DIAGNOSIS — R220 Localized swelling, mass and lump, head: Secondary | ICD-10-CM | POA: Insufficient documentation

## 2012-09-19 DIAGNOSIS — K0889 Other specified disorders of teeth and supporting structures: Secondary | ICD-10-CM

## 2012-09-19 DIAGNOSIS — F909 Attention-deficit hyperactivity disorder, unspecified type: Secondary | ICD-10-CM | POA: Insufficient documentation

## 2012-09-19 MED ORDER — CLINDAMYCIN HCL 300 MG PO CAPS: 300.0000 mg | ORAL_CAPSULE | Freq: Four times a day (QID) | ORAL | Status: DC

## 2012-09-19 MED ORDER — HYDROCODONE-ACETAMINOPHEN 5-325 MG PO TABS: ORAL_TABLET | ORAL | Status: DC

## 2012-09-19 NOTE — ED Notes (Signed)
Dental pain, onset last pm.  Rt mandibular molar

## 2012-09-19 NOTE — ED Provider Notes (Signed)
History     CSN: [id number]  Arrival date & time 09/19/12  1259   First MD Initiated Contact with Patient 09/19/12 1422      Chief Complaint  Patient presents with  . Dental Pain    (Consider location/radiation/quality/duration/timing/severity/associated sxs/prior treatment) Patient is a 21 y.o. male presenting with tooth pain. The history is provided by the patient and a parent.  Dental PainThe primary symptoms include mouth pain. Primary symptoms do not include dental injury, oral bleeding, oral lesions, headaches, fever, shortness of breath, sore throat, angioedema or cough. The symptoms began yesterday. The symptoms are unchanged. The symptoms are new. The symptoms occur constantly.  Affected locations include: teeth and gum(s).  Additional symptoms include: dental sensitivity to temperature, gum tenderness and facial swelling. Additional symptoms do not include: gum swelling, purulent gums, trismus, jaw pain, trouble swallowing, pain with swallowing, ear pain and swollen glands. Medical issues include: periodontal disease. Medical issues do not include: smoking.    Past Medical History  Diagnosis Date  . ADHD (attention deficit hyperactivity disorder)   . Back pain   . Shoulder pain     History reviewed. No pertinent past surgical history.  Family History  Problem Relation Age of Onset  . Stroke Father     History  Substance Use Topics  . Smoking status: Never Smoker   . Smokeless tobacco: Not on file  . Alcohol Use: No      Review of Systems  Constitutional: Negative for fever and appetite change.  HENT: Positive for facial swelling and dental problem. Negative for ear pain, congestion, sore throat, trouble swallowing, neck pain and neck stiffness.   Eyes: Negative for pain and visual disturbance.  Respiratory: Negative for cough and shortness of breath.   Neurological: Negative for dizziness, facial asymmetry and headaches.  Hematological: Negative for  adenopathy.  All other systems reviewed and are negative.    Allergies  Codeine; Lactose intolerance (gi); Percocet; and Penicillins  Home Medications   Current Outpatient Rx  Name  Route  Sig  Dispense  Refill  . ALPRAZOLAM 2 MG PO TABS   Oral   Take 2 mg by mouth 4 (four) times daily as needed. For anxiety         . FLUTICASONE PROPIONATE 50 MCG/ACT NA SUSP   Nasal   Place 2 sprays into the nose daily.           Marland Kitchen LITHIUM CARBONATE 300 MG PO CAPS   Oral   Take 300-600 mg by mouth 2 (two) times daily. Take one capsule (300mg ) every day in the morning, and take two capsules (600mg ) every day at bedtime         . PROPRANOLOL HCL ER BEADS 80 MG PO CP24   Oral   Take 80 mg by mouth daily.          . QUETIAPINE FUMARATE 300 MG PO TABS   Oral   Take 300 mg by mouth at bedtime.            BP 100/61  Pulse 52  Temp 98 F (36.7 C) (Oral)  Resp 18  Ht 5\' 7"  (1.702 m)  Wt 143 lb (64.864 kg)  BMI 22.40 kg/m2  SpO2 100%  Physical Exam  Nursing note and vitals reviewed. Constitutional: He is oriented to person, place, and time. He appears well-developed and well-nourished. No distress.  HENT:  Head: Normocephalic and atraumatic. No trismus in the jaw.  Right Ear: Tympanic membrane and ear  canal normal.  Left Ear: Tympanic membrane and ear canal normal.  Mouth/Throat: Uvula is midline, oropharynx is clear and moist and mucous membranes are normal. Dental caries present. No dental abscesses or uvula swelling.       Patient has multiple dental caries and periodontal disease. Tenderness to palpation of the right lower molars. No obvious facial edema, trismus, or dental abscess.  Neck: Normal range of motion. Neck supple.  Cardiovascular: Normal rate, regular rhythm, normal heart sounds and intact distal pulses.   No murmur heard. Pulmonary/Chest: Effort normal and breath sounds normal.  Musculoskeletal: Normal range of motion.  Lymphadenopathy:    He has no  cervical adenopathy.  Neurological: He is alert and oriented to person, place, and time. He exhibits normal muscle tone. Coordination normal.  Skin: Skin is warm and dry.    ED Course  Procedures (including critical care time)  Labs Reviewed - No data to display No results found.    MDM    Multiple left lower dental caries and periodontal disease. Vital signs are stable. Patient is nontoxic appearing. Has appt with oral surgeon next week.   Prescribed: Clindamycin Norco #10     Eugene Hicks, [place] 09/19/12 1701

## 2012-09-20 NOTE — ED Provider Notes (Signed)
Medical screening examination/treatment/procedure(s) were performed by non-physician practitioner and as supervising physician I was immediately available for consultation/collaboration.  Demiya Magno W. Nicolina Hirt, MD 09/20/12 2254 

## 2012-10-19 ENCOUNTER — Emergency Department (HOSPITAL_COMMUNITY)
Admission: EM | Admit: 2012-10-19 | Discharge: 2012-10-19 | Disposition: A | Discharge status: Home / Self Care | Payer: Medicaid Other | Attending: Emergency Medicine | Admitting: Emergency Medicine

## 2012-10-19 ENCOUNTER — Encounter (HOSPITAL_COMMUNITY): Payer: Self-pay

## 2012-10-19 DIAGNOSIS — F988 Other specified behavioral and emotional disorders with onset usually occurring in childhood and adolescence: Secondary | ICD-10-CM | POA: Insufficient documentation

## 2012-10-19 DIAGNOSIS — H6691 Otitis media, unspecified, right ear: Secondary | ICD-10-CM

## 2012-10-19 DIAGNOSIS — H6690 Otitis media, unspecified, unspecified ear: Secondary | ICD-10-CM | POA: Insufficient documentation

## 2012-10-19 DIAGNOSIS — Z79899 Other long term (current) drug therapy: Secondary | ICD-10-CM | POA: Insufficient documentation

## 2012-10-19 MED ORDER — AZITHROMYCIN 250 MG PO TABS: 250.0000 mg | ORAL_TABLET | Freq: Every day | ORAL | Status: DC

## 2012-10-19 MED ORDER — IBUPROFEN 800 MG PO TABS
800.0000 mg | ORAL_TABLET | Freq: Once | ORAL | Status: AC
  Administered 2012-10-19: 800 mg via ORAL
  Filled 2012-10-19: qty 1

## 2012-10-19 MED ORDER — HYDROCODONE-ACETAMINOPHEN 5-325 MG PO TABS: 1.0000 | ORAL_TABLET | Freq: Four times a day (QID) | ORAL | Status: DC | PRN

## 2012-10-19 MED ORDER — HYDROCODONE-ACETAMINOPHEN 5-325 MG PO TABS
1.0000 | ORAL_TABLET | Freq: Once | ORAL | Status: AC
  Administered 2012-10-19: 1 via ORAL
  Filled 2012-10-19: qty 1

## 2012-10-19 MED ORDER — HYDROCODONE-ACETAMINOPHEN 5-325 MG PO TABS: 2.0000 | ORAL_TABLET | ORAL | Status: DC | PRN

## 2012-10-19 MED ORDER — AZITHROMYCIN 250 MG PO TABS: ORAL_TABLET | ORAL | Status: DC

## 2012-10-19 NOTE — ED Notes (Signed)
Pt c/o r earache x 1 week.

## 2012-10-19 NOTE — ED Provider Notes (Signed)
History     CSN: [id number]  Arrival date & time 10/19/12  1326   First MD Initiated Contact with Patient 10/19/12 1618      Chief Complaint  Patient presents with  . Otalgia    (Consider location/radiation/quality/duration/timing/severity/associated sxs/prior treatment) HPI Comments: Ear has been hurting  Since cerumen flushed out ~ 1 month ago.  Patient is a 22 y.o. male presenting with ear pain. The history is provided by the patient and a parent. No language interpreter was used.  Otalgia This is a new problem. Episode onset: ~ 1 month ago. There is pain in the right ear. The problem occurs constantly. The problem has not changed since onset.There has been no fever. Pertinent negatives include no ear discharge, no hearing loss, no sore throat, no neck pain, no cough and no rash.    Past Medical History  Diagnosis Date  . ADHD (attention deficit hyperactivity disorder)   . Back pain   . Shoulder pain     History reviewed. No pertinent past surgical history.  Family History  Problem Relation Age of Onset  . Stroke Father     History  Substance Use Topics  . Smoking status: Never Smoker   . Smokeless tobacco: Not on file  . Alcohol Use: No      Review of Systems  Constitutional: Negative for fever and chills.  HENT: Positive for ear pain. Negative for hearing loss, sore throat, neck pain and ear discharge.   Respiratory: Negative for cough.   Skin: Negative for rash.  All other systems reviewed and are negative.    Allergies  Codeine; Lactose intolerance (gi); Percocet; and Penicillins  Home Medications   Current Outpatient Rx  Name  Route  Sig  Dispense  Refill  . ALPRAZOLAM 2 MG PO TABS   Oral   Take 2 mg by mouth 4 (four) times daily as needed. For anxiety         . FLUTICASONE PROPIONATE 50 MCG/ACT NA SUSP   Nasal   Place 2 sprays into the nose daily.           Marland Kitchen LITHIUM CARBONATE 300 MG PO CAPS   Oral   Take 300-600 mg by mouth 2 (two)  times daily. Take one capsule (300mg ) every day in the morning, and take two capsules (600mg ) every day at bedtime         . PROPRANOLOL HCL ER BEADS 80 MG PO CP24   Oral   Take 80 mg by mouth daily.          . QUETIAPINE FUMARATE 300 MG PO TABS   Oral   Take 300 mg by mouth at bedtime.          . AZITHROMYCIN 250 MG PO TABS      One tab po QD (initial dose given in the ED)   4 tablet   0   . HYDROCODONE-ACETAMINOPHEN 5-325 MG PO TABS   Oral   Take 1 tablet by mouth every 6 (six) hours as needed for pain.   12 tablet   0     BP 120/47  Pulse 56  Temp 97.6 F (36.4 C) (Oral)  Resp 18  Ht 5\' 7"  (1.702 m)  Wt 150 lb (68.04 kg)  BMI 23.49 kg/m2  SpO2 100%  Physical Exam  Nursing note and vitals reviewed. Constitutional: He is oriented to person, place, and time. He appears well-developed and well-nourished.  HENT:  Head: Normocephalic and atraumatic.  Right  Ear: Hearing, external ear and ear canal normal. Tympanic membrane is erythematous and bulging.  Left Ear: Hearing, tympanic membrane, external ear and ear canal normal.       Loss of landmarks in R ear.  Eyes: EOM are normal.  Neck: Normal range of motion.  Cardiovascular: Normal rate, regular rhythm and intact distal pulses.   Pulmonary/Chest: Effort normal. No respiratory distress.  Abdominal: Soft. He exhibits no distension and no mass. There is no tenderness. There is no rebound and no guarding.  Musculoskeletal: Normal range of motion.  Lymphadenopathy:       Right cervical: No superficial cervical and no deep cervical adenopathy present.      Left cervical: No superficial cervical and no deep cervical adenopathy present.  Neurological: He is alert and oriented to person, place, and time.  Skin: Skin is warm and dry.  Psychiatric: He has a normal mood and affect. Judgment normal.    ED Course  Procedures (including critical care time)  Labs Reviewed - No data to display No results found.   1.  Right otitis media       MDM  rx-zithromax 250 mg x 4 rx-hydrocodone, 12 F/u with PCP prn        Evalina Field, PA 10/19/12 1654

## 2012-10-19 NOTE — ED Provider Notes (Signed)
Medical screening examination/treatment/procedure(s) were performed by non-physician practitioner and as supervising physician I was immediately available for consultation/collaboration.   Lyanne Co, MD 10/19/12 [phone]

## 2012-10-20 ENCOUNTER — Emergency Department (HOSPITAL_COMMUNITY)
Admission: EM | Admit: 2012-10-20 | Discharge: 2012-10-20 | Disposition: A | Discharge status: Home / Self Care | Payer: Medicaid Other | Attending: Emergency Medicine | Admitting: Emergency Medicine

## 2012-10-20 ENCOUNTER — Encounter (HOSPITAL_COMMUNITY): Payer: Self-pay | Admitting: *Deleted

## 2012-10-20 DIAGNOSIS — K029 Dental caries, unspecified: Secondary | ICD-10-CM | POA: Insufficient documentation

## 2012-10-20 DIAGNOSIS — F909 Attention-deficit hyperactivity disorder, unspecified type: Secondary | ICD-10-CM | POA: Insufficient documentation

## 2012-10-20 DIAGNOSIS — H6690 Otitis media, unspecified, unspecified ear: Secondary | ICD-10-CM | POA: Insufficient documentation

## 2012-10-20 DIAGNOSIS — Z79899 Other long term (current) drug therapy: Secondary | ICD-10-CM | POA: Insufficient documentation

## 2012-10-20 DIAGNOSIS — Z8739 Personal history of other diseases of the musculoskeletal system and connective tissue: Secondary | ICD-10-CM | POA: Insufficient documentation

## 2012-10-20 MED ORDER — OXYCODONE-ACETAMINOPHEN 5-325 MG PO TABS
1.0000 | ORAL_TABLET | Freq: Once | ORAL | Status: AC
  Administered 2012-10-20: 1 via ORAL
  Filled 2012-10-20: qty 1

## 2012-10-20 MED ORDER — ANTIPYRINE-BENZOCAINE 5.4-1.4 % OT SOLN
3.0000 [drp] | Freq: Once | OTIC | Status: AC
  Administered 2012-10-20: 3 [drp] via OTIC
  Filled 2012-10-20: qty 10

## 2012-10-20 NOTE — ED Notes (Signed)
Left in c/o father for transport home; instructions and f/u care given/reviewed; verbalizes understanding.

## 2012-10-20 NOTE — ED Notes (Signed)
Reports right earache x 4-5 weeks.  Pt has been treated here recently for same.

## 2012-10-20 NOTE — ED Provider Notes (Signed)
History     CSN: [id number]  Arrival date & time 10/20/12  0909   First MD Initiated Contact with Patient 10/20/12 [phone]      Chief Complaint  Patient presents with  . Otalgia    (Consider location/radiation/quality/duration/timing/severity/associated sxs/prior treatment) HPI Comments: Patient c/o recurrent right ear pain for several weeks.  He was seen here yesterday for same and given pain medication and antibiotics.  Patient's father states he took his first dose of zithromax this morning, but continues to have pain.  He denies headache, dizziness, visual changes , or neck pain  Patient is a 22 y.o. male presenting with ear pain. The history is provided by the patient.  Otalgia This is a recurrent problem. There is pain in the right ear. The problem occurs constantly. The problem has not changed since onset.There has been no fever. The pain is moderate. Pertinent negatives include no ear discharge, no headaches, no hearing loss, no rhinorrhea, no sore throat, no abdominal pain, no vomiting, no neck pain, no cough and no rash. His past medical history is significant for chronic ear infection.    Past Medical History  Diagnosis Date  . ADHD (attention deficit hyperactivity disorder)   . Back pain   . Shoulder pain     History reviewed. No pertinent past surgical history.  Family History  Problem Relation Age of Onset  . Stroke Father     History  Substance Use Topics  . Smoking status: Never Smoker   . Smokeless tobacco: Not on file  . Alcohol Use: No      Review of Systems  Constitutional: Negative for fever, chills and appetite change.  HENT: Positive for ear pain. Negative for hearing loss, congestion, sore throat, facial swelling, rhinorrhea, neck pain, tinnitus and ear discharge.   Eyes: Negative for photophobia and visual disturbance.  Respiratory: Negative for cough and shortness of breath.   Cardiovascular: Negative for chest pain.  Gastrointestinal: Negative  for nausea, vomiting and abdominal pain.  Musculoskeletal: Negative for arthralgias.  Skin: Negative for rash.  Neurological: Negative for dizziness, speech difficulty, weakness, numbness and headaches.    Allergies  Codeine; Lactose intolerance (gi); Percocet; and Penicillins  Home Medications   Current Outpatient Rx  Name  Route  Sig  Dispense  Refill  . ALPRAZOLAM 2 MG PO TABS   Oral   Take 2 mg by mouth 4 (four) times daily as needed. For anxiety         . AZITHROMYCIN 250 MG PO TABS   Oral   Take 1 tablet (250 mg total) by mouth daily. Take first 2 tablets together, then 1 every day until finished.   6 tablet   0   . FLUTICASONE PROPIONATE 50 MCG/ACT NA SUSP   Nasal   Place 2 sprays into the nose daily.           Marland Kitchen HYDROCODONE-ACETAMINOPHEN 5-325 MG PO TABS   Oral   Take 2 tablets by mouth every 4 (four) hours as needed for pain.   15 tablet   0   . LITHIUM CARBONATE 300 MG PO CAPS   Oral   Take 300-600 mg by mouth 2 (two) times daily. Take one capsule (300mg ) every day in the morning, and take two capsules (600mg ) every day at bedtime         . PROPRANOLOL HCL ER BEADS 80 MG PO CP24   Oral   Take 80 mg by mouth daily.          Marland Kitchen  QUETIAPINE FUMARATE 300 MG PO TABS   Oral   Take 300 mg by mouth at bedtime.            BP 107/72  Pulse 64  Temp 97.8 F (36.6 C) (Oral)  Resp 20  Wt 150 lb (68.04 kg)  SpO2 100%  Physical Exam  Nursing note and vitals reviewed. Constitutional: He is oriented to person, place, and time. He appears well-developed and well-nourished. No distress.  HENT:  Head: Normocephalic and atraumatic. No trismus in the jaw.  Right Ear: Ear canal normal. There is tenderness. No drainage or swelling. Tympanic membrane is erythematous. Tympanic membrane is not bulging. No middle ear effusion. No hemotympanum.  Left Ear: Tympanic membrane and ear canal normal. No drainage, swelling or tenderness.  Mouth/Throat: Uvula is midline,  oropharynx is clear and moist and mucous membranes are normal. Dental caries present. No uvula swelling.  Eyes: Pupils are equal, round, and reactive to light.  Neck: Normal range of motion. Neck supple. No thyromegaly present.  Cardiovascular: Normal rate, regular rhythm, normal heart sounds and intact distal pulses.   No murmur heard. Pulmonary/Chest: Effort normal and breath sounds normal. No respiratory distress.  Musculoskeletal: Normal range of motion.  Lymphadenopathy:    He has no cervical adenopathy.  Neurological: He is alert and oriented to person, place, and time. He exhibits normal muscle tone. Coordination normal.  Skin: Skin is warm and dry.    ED Course  Procedures (including critical care time)  Labs Reviewed - No data to display No results found.      MDM   Patient was seen here yesterday and treated for the same. Took his initial dose of antibiotics this morning. Previous ED chart was reviewed by me.   Patient is well appearing. Vital signs are stable.  Mild erythema the right tympanic membrane. No perforation, no mastoid tenderness or cervical lymphadenopathy.   Patient given one Percocet in the department for pain and Auralgan otic drops were dispensed. Father agrees to followup with his primary care physician will also give a referral for ENT     Ioane Bhola L. [place], [place] 10/20/12 2023

## 2012-10-21 NOTE — ED Provider Notes (Signed)
Medical screening examination/treatment/procedure(s) were performed by non-physician practitioner and as supervising physician I was immediately available for consultation/collaboration.  Raeford Razor, MD 10/21/12 [phone]

## 2012-10-28 ENCOUNTER — Encounter (HOSPITAL_COMMUNITY): Payer: Self-pay

## 2012-10-28 ENCOUNTER — Emergency Department (HOSPITAL_COMMUNITY)
Admission: EM | Admit: 2012-10-28 | Discharge: 2012-10-28 | Disposition: A | Discharge status: Home / Self Care | Payer: Medicaid Other | Attending: Emergency Medicine | Admitting: Emergency Medicine

## 2012-10-28 DIAGNOSIS — S86919A Strain of unspecified muscle(s) and tendon(s) at lower leg level, unspecified leg, initial encounter: Secondary | ICD-10-CM

## 2012-10-28 DIAGNOSIS — Y929 Unspecified place or not applicable: Secondary | ICD-10-CM | POA: Insufficient documentation

## 2012-10-28 DIAGNOSIS — Y9389 Activity, other specified: Secondary | ICD-10-CM | POA: Insufficient documentation

## 2012-10-28 DIAGNOSIS — Z8739 Personal history of other diseases of the musculoskeletal system and connective tissue: Secondary | ICD-10-CM | POA: Insufficient documentation

## 2012-10-28 DIAGNOSIS — X503XXA Overexertion from repetitive movements, initial encounter: Secondary | ICD-10-CM | POA: Insufficient documentation

## 2012-10-28 DIAGNOSIS — F909 Attention-deficit hyperactivity disorder, unspecified type: Secondary | ICD-10-CM | POA: Insufficient documentation

## 2012-10-28 DIAGNOSIS — Z79899 Other long term (current) drug therapy: Secondary | ICD-10-CM | POA: Insufficient documentation

## 2012-10-28 DIAGNOSIS — IMO0002 Reserved for concepts with insufficient information to code with codable children: Secondary | ICD-10-CM | POA: Insufficient documentation

## 2012-10-28 MED ORDER — ACETAMINOPHEN 325 MG PO TABS
650.0000 mg | ORAL_TABLET | Freq: Once | ORAL | Status: AC
  Administered 2012-10-28: 650 mg via ORAL
  Filled 2012-10-28: qty 2

## 2012-10-28 NOTE — ED Notes (Signed)
Pt reports picking up some wood today and felt a sharp pain to his groin area.

## 2012-10-28 NOTE — ED Provider Notes (Signed)
History     CSN: [id number]  Arrival date & time 10/28/12  1257   First MD Initiated Contact with Patient 10/28/12 1659      No chief complaint on file.   (Consider location/radiation/quality/duration/timing/severity/associated sxs/prior treatment) HPI  Patient complaining of left groin and thigh pain after lifting fire wood this a.m.  No fall or direct trauma noted. Father states complained all day long.  Patient states it hurts more with walking and knee movement.  No redness, fever, or swelling noted.    Past Medical History  Diagnosis Date  . ADHD (attention deficit hyperactivity disorder)   . Back pain   . Shoulder pain     History reviewed. No pertinent past surgical history.  Family History  Problem Relation Age of Onset  . Stroke Father     History  Substance Use Topics  . Smoking status: Never Smoker   . Smokeless tobacco: Not on file  . Alcohol Use: No      Review of Systems  All other systems reviewed and are negative.    Allergies  Codeine; Lactose intolerance (gi); Percocet; and Penicillins  Home Medications   Current Outpatient Rx  Name  Route  Sig  Dispense  Refill  . ALPRAZOLAM 2 MG PO TABS   Oral   Take 2 mg by mouth 4 (four) times daily as needed. For anxiety         . AZITHROMYCIN 250 MG PO TABS   Oral   Take 1 tablet (250 mg total) by mouth daily. Take first 2 tablets together, then 1 every day until finished.   6 tablet   0   . FLUTICASONE PROPIONATE 50 MCG/ACT NA SUSP   Nasal   Place 2 sprays into the nose daily.           Marland Kitchen HYDROCODONE-ACETAMINOPHEN 5-325 MG PO TABS   Oral   Take 2 tablets by mouth every 4 (four) hours as needed for pain.   15 tablet   0   . LITHIUM CARBONATE 300 MG PO CAPS   Oral   Take 300-600 mg by mouth 2 (two) times daily. Take one capsule (300mg ) every day in the morning, and take two capsules (600mg ) every day at bedtime         . PROPRANOLOL HCL ER BEADS 80 MG PO CP24   Oral   Take 80  mg by mouth daily.          . QUETIAPINE FUMARATE 300 MG PO TABS   Oral   Take 300 mg by mouth at bedtime.            BP 110/46  Pulse 82  Temp 97.3 F (36.3 C) (Oral)  Resp 20  Ht 5\' 6"  (1.676 m)  Wt 150 lb (68.04 kg)  BMI 24.21 kg/m2  SpO2 100%  Physical Exam  Nursing note and vitals reviewed. Constitutional: He appears well-developed and well-nourished.  HENT:  Head: Normocephalic and atraumatic.  Right Ear: External ear normal.  Left Ear: External ear normal.  Eyes: Pupils are equal, round, and reactive to light.  Neck: Normal range of motion.  Genitourinary: Penis normal.       No scrotal swelling or hernia noted, no testicular tenderness.    Musculoskeletal: Normal range of motion. He exhibits no edema and no tenderness.       Patient ambulatory with normal gait, able to walk on toes and heel.    Neurological: He is alert. He has normal  strength and normal reflexes. No sensory deficit. He displays a negative Romberg sign. GCS eye subscore is 4. GCS verbal subscore is 5. GCS motor subscore is 6.    ED Course  Procedures (including critical care time)  Labs Reviewed - No data to display No results found.   No diagnosis found.    MDM  Plan follow up with primary.  No signs of trauma or infection.          Hilario Quarry, MD 10/28/12 [phone]

## 2012-11-05 ENCOUNTER — Encounter (HOSPITAL_COMMUNITY): Payer: Self-pay | Admitting: *Deleted

## 2012-11-05 ENCOUNTER — Emergency Department (HOSPITAL_COMMUNITY)
Admission: EM | Admit: 2012-11-05 | Discharge: 2012-11-05 | Disposition: A | Discharge status: Home / Self Care | Payer: Medicaid Other | Attending: Emergency Medicine | Admitting: Emergency Medicine

## 2012-11-05 DIAGNOSIS — Z8659 Personal history of other mental and behavioral disorders: Secondary | ICD-10-CM | POA: Insufficient documentation

## 2012-11-05 DIAGNOSIS — H53149 Visual discomfort, unspecified: Secondary | ICD-10-CM | POA: Insufficient documentation

## 2012-11-05 DIAGNOSIS — K089 Disorder of teeth and supporting structures, unspecified: Secondary | ICD-10-CM | POA: Insufficient documentation

## 2012-11-05 DIAGNOSIS — K0889 Other specified disorders of teeth and supporting structures: Secondary | ICD-10-CM

## 2012-11-05 DIAGNOSIS — Z79899 Other long term (current) drug therapy: Secondary | ICD-10-CM | POA: Insufficient documentation

## 2012-11-05 DIAGNOSIS — R51 Headache: Secondary | ICD-10-CM | POA: Insufficient documentation

## 2012-11-05 MED ORDER — IBUPROFEN 800 MG PO TABS: 800.0000 mg | ORAL_TABLET | Freq: Three times a day (TID) | ORAL | Status: DC

## 2012-11-05 MED ORDER — CHLORHEXIDINE GLUCONATE 0.12 % MT SOLN: OROMUCOSAL | Status: DC

## 2012-11-05 MED ORDER — CLINDAMYCIN HCL 150 MG PO CAPS
300.0000 mg | ORAL_CAPSULE | Freq: Once | ORAL | Status: AC
  Administered 2012-11-05: 300 mg via ORAL
  Filled 2012-11-05: qty 2

## 2012-11-05 MED ORDER — CLINDAMYCIN HCL 300 MG PO CAPS: 300.0000 mg | ORAL_CAPSULE | Freq: Four times a day (QID) | ORAL | Status: DC

## 2012-11-05 MED ORDER — HYDROCODONE-ACETAMINOPHEN 5-325 MG PO TABS
1.0000 | ORAL_TABLET | Freq: Once | ORAL | Status: AC
  Administered 2012-11-05: 1 via ORAL
  Filled 2012-11-05: qty 1

## 2012-11-05 NOTE — ED Notes (Signed)
Tooth broke off in gum this morning. NAD>

## 2012-11-05 NOTE — ED Provider Notes (Signed)
History     CSN: [id number]  Arrival date & time 11/05/12  4098   First MD Initiated Contact with Patient 11/05/12 [phone]      Chief Complaint  Patient presents with  . Dental Pain    (Consider location/radiation/quality/duration/timing/severity/associated sxs/prior treatment) Patient is a 22 y.o. male presenting with tooth pain. The history is provided by the patient and a parent.  Dental PainThe primary symptoms include headaches. Primary symptoms do not include fever or shortness of breath. The symptoms began 1 to 2 hours ago. The symptoms are unchanged. The symptoms are new. The symptoms occur constantly.  The headache is associated with photophobia. The headache is not associated with eye pain, neck stiffness or weakness.  Additional symptoms include: dental sensitivity to temperature and gum tenderness. Additional symptoms do not include: gum swelling, purulent gums, trismus, jaw pain, facial swelling, trouble swallowing, drooling, ear pain and nosebleeds. Medical issues include: periodontal disease. Medical issues do not include: smoking.    Past Medical History  Diagnosis Date  . ADHD (attention deficit hyperactivity disorder)   . Back pain   . Shoulder pain     History reviewed. No pertinent past surgical history.  Family History  Problem Relation Age of Onset  . Stroke Father     History  Substance Use Topics  . Smoking status: Never Smoker   . Smokeless tobacco: Not on file  . Alcohol Use: No      Review of Systems  Constitutional: Negative for fever, chills, activity change and appetite change.  HENT: Positive for dental problem. Negative for ear pain, nosebleeds, facial swelling, drooling, trouble swallowing, neck pain and neck stiffness.   Eyes: Positive for photophobia. Negative for pain and visual disturbance.  Respiratory: Negative for shortness of breath.   Cardiovascular: Negative for chest pain.  Gastrointestinal: Negative for nausea and vomiting.    Genitourinary: Negative for dysuria and flank pain.  Skin: Negative for rash and wound.  Neurological: Positive for headaches. Negative for dizziness, facial asymmetry, speech difficulty, weakness and numbness.  Psychiatric/Behavioral: Negative for confusion and decreased concentration.  All other systems reviewed and are negative.    Allergies  Codeine; Lactose intolerance (gi); Percocet; and Penicillins  Home Medications   Current Outpatient Rx  Name  Route  Sig  Dispense  Refill  . ALPRAZOLAM 2 MG PO TABS   Oral   Take 2 mg by mouth 4 (four) times daily as needed. For anxiety         . FLUTICASONE PROPIONATE 50 MCG/ACT NA SUSP   Nasal   Place 2 sprays into the nose daily.           Marland Kitchen LITHIUM CARBONATE 300 MG PO CAPS   Oral   Take 300-600 mg by mouth 2 (two) times daily. Take one capsule (300mg ) every day in the morning, and take two capsules (600mg ) every day at bedtime         . PROPRANOLOL HCL ER BEADS 80 MG PO CP24   Oral   Take 80 mg by mouth daily.          . QUETIAPINE FUMARATE 300 MG PO TABS   Oral   Take 300 mg by mouth at bedtime.            BP 116/58  Pulse 75  Temp 98.7 F (37.1 C) (Oral)  Resp 16  SpO2 100%  Physical Exam  Nursing note and vitals reviewed. Constitutional: He is oriented to person, place, and time. He  appears well-developed and well-nourished. No distress.  HENT:  Head: Normocephalic and atraumatic. No trismus in the jaw.  Right Ear: Tympanic membrane and ear canal normal.  Left Ear: Tympanic membrane and ear canal normal.  Mouth/Throat: Uvula is midline, oropharynx is clear and moist and mucous membranes are normal. Dental caries present. No dental abscesses or uvula swelling.         ttp of the left upper teeth, mainly the premolars.  Diffuse dental decay  Neck: Normal range of motion. Neck supple.  Cardiovascular: Normal rate, regular rhythm, normal heart sounds and intact distal pulses.   No murmur  heard. Pulmonary/Chest: Effort normal and breath sounds normal.  Musculoskeletal: Normal range of motion.  Lymphadenopathy:    He has no cervical adenopathy.  Neurological: He is alert and oriented to person, place, and time. He exhibits normal muscle tone. Coordination normal.  Skin: Skin is warm and dry.    ED Course  Procedures (including critical care time)  Labs Reviewed - No data to display No results found.      MDM   Patient has ttp of the left upper premolars.  Widespread dental caries and gum disease. Most of the teeth are discolored and decayed to the gumline,.  Pt is otherwise well appearing, no facial swelling, trismus, sublingual abnml, or dental abscess at this time.   Father states he has dental appt in [place] with an oral surgeon first week of Feb  Prescribed: clindaymycin ibuprofen      Jolene Guyett L. [place], [place] 11/05/12 [phone]

## 2012-11-05 NOTE — ED Provider Notes (Signed)
Medical screening examination/treatment/procedure(s) were performed by non-physician practitioner and as supervising physician I was immediately available for consultation/collaboration.  Glynn Octave, MD 11/05/12 1002

## 2012-11-20 ENCOUNTER — Emergency Department (HOSPITAL_COMMUNITY)
Admission: EM | Admit: 2012-11-20 | Discharge: 2012-11-20 | Disposition: A | Discharge status: Home / Self Care | Payer: Medicaid Other | Attending: Emergency Medicine | Admitting: Emergency Medicine

## 2012-11-20 ENCOUNTER — Encounter (HOSPITAL_COMMUNITY): Payer: Self-pay | Admitting: Emergency Medicine

## 2012-11-20 DIAGNOSIS — M25519 Pain in unspecified shoulder: Secondary | ICD-10-CM | POA: Insufficient documentation

## 2012-11-20 DIAGNOSIS — M549 Dorsalgia, unspecified: Secondary | ICD-10-CM | POA: Insufficient documentation

## 2012-11-20 DIAGNOSIS — M5430 Sciatica, unspecified side: Secondary | ICD-10-CM | POA: Insufficient documentation

## 2012-11-20 DIAGNOSIS — F909 Attention-deficit hyperactivity disorder, unspecified type: Secondary | ICD-10-CM | POA: Insufficient documentation

## 2012-11-20 DIAGNOSIS — M5431 Sciatica, right side: Secondary | ICD-10-CM

## 2012-11-20 LAB — URINALYSIS, ROUTINE W REFLEX MICROSCOPIC
Ketones, ur: NEGATIVE mg/dL
Leukocytes, UA: NEGATIVE
Nitrite: NEGATIVE
Protein, ur: NEGATIVE mg/dL
pH: 5.5 (ref 5.0–8.0)

## 2012-11-20 MED ORDER — CYCLOBENZAPRINE HCL 10 MG PO TABS: 5.0000 mg | ORAL_TABLET | Freq: Three times a day (TID) | ORAL | Status: DC | PRN

## 2012-11-20 MED ORDER — CYCLOBENZAPRINE HCL 10 MG PO TABS
10.0000 mg | ORAL_TABLET | Freq: Once | ORAL | Status: AC
  Administered 2012-11-20: 10 mg via ORAL
  Filled 2012-11-20: qty 1
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
## 2012-11-20 NOTE — ED Notes (Signed)
Pt c/o right flank pain since cutting fire wood yesterday, also c/o dysuria.

## 2012-11-20 NOTE — ED Notes (Signed)
In to discharge pt. When nurse ask pt to rate his pain the pt's dad told him to say 10. Pt's dad was also upset that pt did not any pain medication

## 2012-11-20 NOTE — ED Provider Notes (Signed)
Medical screening examination/treatment/procedure(s) were performed by non-physician practitioner and as supervising physician I was immediately available for consultation/collaboration.   Lyanne Co, MD 11/20/12 1003

## 2012-12-09 ENCOUNTER — Emergency Department (HOSPITAL_COMMUNITY)
Admission: EM | Admit: 2012-12-09 | Discharge: 2012-12-09 | Disposition: A | Discharge status: Home / Self Care | Payer: Medicaid Other | Attending: Emergency Medicine | Admitting: Emergency Medicine

## 2012-12-09 ENCOUNTER — Encounter (HOSPITAL_COMMUNITY): Payer: Self-pay | Admitting: *Deleted

## 2012-12-09 DIAGNOSIS — Z79899 Other long term (current) drug therapy: Secondary | ICD-10-CM | POA: Insufficient documentation

## 2012-12-09 DIAGNOSIS — R1013 Epigastric pain: Secondary | ICD-10-CM | POA: Insufficient documentation

## 2012-12-09 DIAGNOSIS — F909 Attention-deficit hyperactivity disorder, unspecified type: Secondary | ICD-10-CM | POA: Insufficient documentation

## 2012-12-09 DIAGNOSIS — R197 Diarrhea, unspecified: Secondary | ICD-10-CM | POA: Insufficient documentation

## 2012-12-09 DIAGNOSIS — R42 Dizziness and giddiness: Secondary | ICD-10-CM | POA: Insufficient documentation

## 2012-12-09 LAB — COMPREHENSIVE METABOLIC PANEL
AST: 21 U/L (ref 0–37)
Albumin: 4.6 g/dL (ref 3.5–5.2)
Calcium: 9.9 mg/dL (ref 8.4–10.5)
Chloride: 103 mEq/L (ref 96–112)
Creatinine, Ser: 0.91 mg/dL (ref 0.50–1.35)
Total Protein: 7.6 g/dL (ref 6.0–8.3)

## 2012-12-09 LAB — URINALYSIS, ROUTINE W REFLEX MICROSCOPIC
Glucose, UA: NEGATIVE mg/dL
Leukocytes, UA: NEGATIVE
Nitrite: NEGATIVE
pH: 6.5 (ref 5.0–8.0)

## 2012-12-09 LAB — CBC WITH DIFFERENTIAL/PLATELET
Basophils Absolute: 0 10*3/uL (ref 0.0–0.1)
Blasts: 0 %
Eosinophils Absolute: 0.5 10*3/uL (ref 0.0–0.7)
Hemoglobin: 11.7 g/dL — ABNORMAL LOW (ref 13.0–17.0)
MCH: 20.5 pg — ABNORMAL LOW (ref 26.0–34.0)
Metamyelocytes Relative: 0 %
Monocytes Absolute: 0.6 10*3/uL (ref 0.1–1.0)
Monocytes Relative: 7 % (ref 3–12)
Myelocytes: 0 %
Neutro Abs: 5.2 10*3/uL (ref 1.7–7.7)
Promyelocytes Absolute: 0 %
RBC: 5.71 MIL/uL (ref 4.22–5.81)

## 2012-12-09 MED ORDER — DICYCLOMINE HCL 20 MG PO TABS: 20.0000 mg | ORAL_TABLET | Freq: Two times a day (BID) | ORAL | Status: DC

## 2012-12-09 MED ORDER — SODIUM CHLORIDE 0.9 % IV BOLUS (SEPSIS)
1000.0000 mL | Freq: Once | INTRAVENOUS | Status: AC
  Administered 2012-12-09: 1000 mL via INTRAVENOUS

## 2012-12-09 MED ORDER — ONDANSETRON HCL 4 MG PO TABS: 4.0000 mg | ORAL_TABLET | Freq: Four times a day (QID) | ORAL | Status: DC

## 2012-12-09 MED ORDER — ONDANSETRON HCL 4 MG/2ML IJ SOLN
4.0000 mg | Freq: Once | INTRAMUSCULAR | Status: AC
  Administered 2012-12-09: 4 mg via INTRAVENOUS
  Filled 2012-12-09: qty 2

## 2012-12-09 NOTE — ED Provider Notes (Signed)
History     CSN: [id number]  Arrival date & time 12/09/12  1604   First MD Initiated Contact with Patient 12/09/12 1613      Chief Complaint  Patient presents with  . Weakness    (Consider location/radiation/quality/duration/timing/severity/associated sxs/prior treatment) HPI Comments: Patient presents with one day of generalized weakness, epigastric pain and diarrhea. Reports 4 episodes of loose nonbloody stools today. States he feels lightheaded and generally weak. His mother has diarrhea as well. Denies any vomiting. Denies any fever, urinary symptoms, testicular pain. Denies any previous abdominal surgeries. Denies sore throat, chest pain, cough, shortness of breath or urinary symptoms.  The history is provided by the patient and a relative.    Past Medical History  Diagnosis Date  . ADHD (attention deficit hyperactivity disorder)   . Back pain   . Shoulder pain     History reviewed. No pertinent past surgical history.  Family History  Problem Relation Age of Onset  . Stroke Father     History  Substance Use Topics  . Smoking status: Never Smoker   . Smokeless tobacco: Not on file  . Alcohol Use: No      Review of Systems  Constitutional: Positive for activity change and fatigue. Negative for fever.  HENT: Negative for congestion and rhinorrhea.   Respiratory: Negative for chest tightness.   Cardiovascular: Negative for chest pain.  Gastrointestinal: Positive for abdominal pain and diarrhea. Negative for nausea, vomiting and blood in stool.  Musculoskeletal: Negative for back pain.  Skin: Negative for rash.  Neurological: Positive for weakness. Negative for dizziness and headaches.  A complete 10 system review of systems was obtained and all systems are negative except as noted in the HPI and PMH.    Allergies  Codeine; Lactose intolerance (gi); Percocet; and Penicillins  Home Medications   Current Outpatient Rx  Name  Route  Sig  Dispense  Refill  .  alprazolam (XANAX) 2 MG tablet   Oral   Take 2 mg by mouth 4 (four) times daily as needed. For anxiety         . cyclobenzaprine (FLEXERIL) 10 MG tablet   Oral   Take 0.5 tablets (5 mg total) by mouth 3 (three) times daily as needed for muscle spasms.   15 tablet   0   . fluticasone (FLONASE) 50 MCG/ACT nasal spray   Nasal   Place 2 sprays into the nose daily.           Marland Kitchen lithium carbonate 300 MG capsule   Oral   Take 300-600 mg by mouth 2 (two) times daily. Take one capsule (300mg ) every day in the morning, and take two capsules (600mg ) every day at bedtime         . propranolol (INNOPRAN XL) 80 MG 24 hr capsule   Oral   Take 80 mg by mouth daily.          . QUEtiapine (SEROQUEL) 300 MG tablet   Oral   Take 300 mg by mouth at bedtime.          . dicyclomine (BENTYL) 20 MG tablet   Oral   Take 1 tablet (20 mg total) by mouth 2 (two) times daily.   20 tablet   0   . ondansetron (ZOFRAN) 4 MG tablet   Oral   Take 1 tablet (4 mg total) by mouth every 6 (six) hours.   12 tablet   0     BP 114/50  Pulse 65  Temp(Src) 98.1 F (36.7 C) (Oral)  Resp 18  Ht 5\' 6"  (1.676 m)  Wt 139 lb (63.05 kg)  BMI 22.45 kg/m2  SpO2 100%  Physical Exam  Constitutional: He is oriented to person, place, and time. He appears well-developed and well-nourished. No distress.  HENT:  Head: Normocephalic and atraumatic.  Mouth/Throat: Oropharynx is clear and moist. No oropharyngeal exudate.  Eyes: Conjunctivae and EOM are normal. Pupils are equal, round, and reactive to light.  Neck: Normal range of motion. Neck supple.  Cardiovascular: Normal rate, regular rhythm and normal heart sounds.   No murmur heard. Pulmonary/Chest: Effort normal and breath sounds normal. No respiratory distress.  Abdominal: Soft. There is tenderness. There is no rebound and no guarding.  Mild epigastric pain  Musculoskeletal: Normal range of motion. He exhibits no edema and no tenderness.   Neurological: He is alert and oriented to person, place, and time. No cranial nerve deficit. He exhibits normal muscle tone. Coordination normal.  Skin: Skin is warm.    ED Course  Procedures (including critical care time)  Labs Reviewed  CBC WITH DIFFERENTIAL - Abnormal; Notable for the following:    Hemoglobin 11.7 (*)    HCT 35.8 (*)    MCV 62.7 (*)    MCH 20.5 (*)    Eosinophils Relative 6 (*)    All other components within normal limits  COMPREHENSIVE METABOLIC PANEL - Abnormal; Notable for the following:    Glucose, Bld 102 (*)    All other components within normal limits  LIPASE, BLOOD  URINALYSIS, ROUTINE W REFLEX MICROSCOPIC   No results found.   1. Diarrhea       MDM  One day of generalized weakness, diarrhea, epigastric pain. Vital stable, no distress, abdomen soft and nonsurgical  Orthostatics negative. No vomiting or diarrhea in the ED. Abdomen soft and nonsurgical. Electrolyte within normal limits. UA negative.  Patient tolerating by mouth in the ED. Symptom control for diarrhea. Follow up with PCP.      Glynn Octave, MD 12/09/12 [phone]

## 2012-12-09 NOTE — ED Notes (Signed)
Pt says he feels weak, began having diarrhea today.  abd pain, No vomiting.

## 2012-12-09 NOTE — ED Notes (Signed)
Patient with no complaints at this time. Respirations even and unlabored. Skin warm/dry. Discharge instructions reviewed with patient at this time. Patient given opportunity to voice concerns/ask questions. IV removed per policy and band-aid applied to site. Patient discharged at this time and left Emergency Department with steady gait.  

## 2012-12-10 ENCOUNTER — Encounter (HOSPITAL_COMMUNITY): Payer: Self-pay | Admitting: *Deleted

## 2012-12-10 ENCOUNTER — Emergency Department (HOSPITAL_COMMUNITY)
Admission: EM | Admit: 2012-12-10 | Discharge: 2012-12-10 | Disposition: A | Discharge status: Home / Self Care | Payer: Medicaid Other | Attending: Emergency Medicine | Admitting: Emergency Medicine

## 2012-12-10 DIAGNOSIS — IMO0002 Reserved for concepts with insufficient information to code with codable children: Secondary | ICD-10-CM | POA: Insufficient documentation

## 2012-12-10 DIAGNOSIS — Z8739 Personal history of other diseases of the musculoskeletal system and connective tissue: Secondary | ICD-10-CM | POA: Insufficient documentation

## 2012-12-10 DIAGNOSIS — H7290 Unspecified perforation of tympanic membrane, unspecified ear: Secondary | ICD-10-CM | POA: Insufficient documentation

## 2012-12-10 DIAGNOSIS — Z79899 Other long term (current) drug therapy: Secondary | ICD-10-CM | POA: Insufficient documentation

## 2012-12-10 DIAGNOSIS — F909 Attention-deficit hyperactivity disorder, unspecified type: Secondary | ICD-10-CM | POA: Insufficient documentation

## 2012-12-10 DIAGNOSIS — H9190 Unspecified hearing loss, unspecified ear: Secondary | ICD-10-CM | POA: Insufficient documentation

## 2012-12-10 MED ORDER — TRAMADOL HCL 50 MG PO TABS: 50.0000 mg | ORAL_TABLET | Freq: Four times a day (QID) | ORAL | Status: DC | PRN

## 2012-12-10 NOTE — ED Notes (Signed)
Pt c/o right ear pain that started today.  ?

## 2012-12-10 NOTE — ED Notes (Signed)
No answer in waiting room 

## 2012-12-15 NOTE — ED Provider Notes (Signed)
History     CSN: [id number]  Arrival date & time 12/10/12  4098   First MD Initiated Contact with Patient 12/10/12 1905      Chief Complaint  Patient presents with  . Otalgia    (Consider location/radiation/quality/duration/timing/severity/associated sxs/prior treatment) Patient is a 22 y.o. male presenting with ear pain. The history is provided by the patient and a parent.  Otalgia Location:  Right Quality:  Aching Severity:  Moderate Onset quality:  Gradual Timing:  Constant Progression:  Worsening Chronicity:  New Context: foreign body   Context: not direct blow   Context comment:  He has used qtips in the ear.  He also has a history of recent otitis media.  He denies drainage from the ear. Relieved by:  Nothing Associated symptoms: hearing loss   Associated symptoms: no abdominal pain, no congestion, no ear discharge, no fever, no headaches, no neck pain, no rhinorrhea, no sore throat and no tinnitus     Past Medical History  Diagnosis Date  . ADHD (attention deficit hyperactivity disorder)   . Back pain   . Shoulder pain     History reviewed. No pertinent past surgical history.  Family History  Problem Relation Age of Onset  . Stroke Father     History  Substance Use Topics  . Smoking status: Never Smoker   . Smokeless tobacco: Not on file  . Alcohol Use: No      Review of Systems  Constitutional: Negative for fever and chills.  HENT: Positive for hearing loss and ear pain. Negative for congestion, sore throat, rhinorrhea, neck pain, tinnitus and ear discharge.   Eyes: Negative.   Respiratory: Negative for chest tightness and shortness of breath.   Cardiovascular: Negative for chest pain.  Gastrointestinal: Negative for nausea and abdominal pain.  Musculoskeletal: Negative for arthralgias.  Skin: Negative.  Negative for wound.  Neurological: Negative for dizziness, weakness, light-headedness, numbness and headaches.  Psychiatric/Behavioral:  Negative.     Allergies  Codeine; Lactose intolerance (gi); Percocet; and Penicillins  Home Medications   Current Outpatient Rx  Name  Route  Sig  Dispense  Refill  . alprazolam (XANAX) 2 MG tablet   Oral   Take 2 mg by mouth 4 (four) times daily as needed. For anxiety         . cyclobenzaprine (FLEXERIL) 10 MG tablet   Oral   Take 0.5 tablets (5 mg total) by mouth 3 (three) times daily as needed for muscle spasms.   15 tablet   0   . dicyclomine (BENTYL) 20 MG tablet   Oral   Take 1 tablet (20 mg total) by mouth 2 (two) times daily.   20 tablet   0   . fluticasone (FLONASE) 50 MCG/ACT nasal spray   Nasal   Place 2 sprays into the nose daily.           Marland Kitchen lithium carbonate 300 MG capsule   Oral   Take 300-600 mg by mouth 2 (two) times daily. Take one capsule (300mg ) every day in the morning, and take two capsules (600mg ) every day at bedtime         . ondansetron (ZOFRAN) 4 MG tablet   Oral   Take 1 tablet (4 mg total) by mouth every 6 (six) hours.   12 tablet   0   . propranolol (INNOPRAN XL) 80 MG 24 hr capsule   Oral   Take 80 mg by mouth daily.          Marland Kitchen  QUEtiapine (SEROQUEL) 300 MG tablet   Oral   Take 300 mg by mouth at bedtime.          . traMADol (ULTRAM) 50 MG tablet   Oral   Take 1 tablet (50 mg total) by mouth every 6 (six) hours as needed for pain.   15 tablet   0     BP 116/50  Pulse 61  Temp(Src) 97.9 F (36.6 C)  Resp 18  SpO2 98%  Physical Exam  Constitutional: He is oriented to person, place, and time. He appears well-developed and well-nourished.  HENT:  Head: Normocephalic and atraumatic.  Right Ear: Ear canal normal. No drainage. No mastoid tenderness. Tympanic membrane is perforated. No hemotympanum.  Left Ear: Tympanic membrane and ear canal normal. No drainage or swelling. Tympanic membrane is not perforated.  Nose: Mucosal edema and rhinorrhea present.  Mouth/Throat: Uvula is midline, oropharynx is clear and moist  and mucous membranes are normal. No oropharyngeal exudate, posterior oropharyngeal edema, posterior oropharyngeal erythema or tonsillar abscesses.  Eyes: Conjunctivae are normal.  Cardiovascular: Normal rate and normal heart sounds.   Pulmonary/Chest: Effort normal. No respiratory distress. He has no wheezes. He has no rales.  Abdominal: Soft. There is no tenderness.  Musculoskeletal: Normal range of motion.  Neurological: He is alert and oriented to person, place, and time.  Skin: Skin is warm and dry. No rash noted.  Psychiatric: He has a normal mood and affect.    ED Course  Procedures (including critical care time)  Labs Reviewed - No data to display No results found.   1. Tympanic membrane perforation, right       MDM  Pt advised to avoid getting any liquids in the ear.  Referral to Dr. Suszanne Conners given.  Encouraged ibuprofen prn pain. Also prescribed tramadol.        Burgess Amor, [place] 12/15/12 [phone]

## 2012-12-15 NOTE — ED Provider Notes (Signed)
Medical screening examination/treatment/procedure(s) were performed by non-physician practitioner and as supervising physician I was immediately available for consultation/collaboration.   [place] Hijazi L Tyliyah Mcmeekin, MD 12/15/12 0756 

## 2012-12-18 ENCOUNTER — Emergency Department (HOSPITAL_COMMUNITY)
Admission: EM | Admit: 2012-12-18 | Discharge: 2012-12-18 | Disposition: A | Discharge status: Home / Self Care | Payer: Medicaid Other | Attending: Emergency Medicine | Admitting: Emergency Medicine

## 2012-12-18 ENCOUNTER — Encounter (HOSPITAL_COMMUNITY): Payer: Self-pay

## 2012-12-18 DIAGNOSIS — H9190 Unspecified hearing loss, unspecified ear: Secondary | ICD-10-CM | POA: Insufficient documentation

## 2012-12-18 DIAGNOSIS — F909 Attention-deficit hyperactivity disorder, unspecified type: Secondary | ICD-10-CM | POA: Insufficient documentation

## 2012-12-18 DIAGNOSIS — J3489 Other specified disorders of nose and nasal sinuses: Secondary | ICD-10-CM | POA: Insufficient documentation

## 2012-12-18 DIAGNOSIS — H9209 Otalgia, unspecified ear: Secondary | ICD-10-CM | POA: Insufficient documentation

## 2012-12-18 DIAGNOSIS — IMO0002 Reserved for concepts with insufficient information to code with codable children: Secondary | ICD-10-CM | POA: Insufficient documentation

## 2012-12-18 DIAGNOSIS — Z87828 Personal history of other (healed) physical injury and trauma: Secondary | ICD-10-CM | POA: Insufficient documentation

## 2012-12-18 DIAGNOSIS — Z8739 Personal history of other diseases of the musculoskeletal system and connective tissue: Secondary | ICD-10-CM | POA: Insufficient documentation

## 2012-12-18 DIAGNOSIS — Z79899 Other long term (current) drug therapy: Secondary | ICD-10-CM | POA: Insufficient documentation

## 2012-12-18 MED ORDER — HYDROCODONE-ACETAMINOPHEN 7.5-500 MG/15ML PO SOLN: 10.0000 mL | Freq: Four times a day (QID) | ORAL | Status: DC | PRN

## 2012-12-18 NOTE — ED Provider Notes (Signed)
Medical screening examination/treatment/procedure(s) were performed by non-physician practitioner and as supervising physician I was immediately available for consultation/collaboration.   Joseph L Zammit, MD 12/18/12 1459 

## 2012-12-18 NOTE — ED Notes (Signed)
Pt complaining of pain in right ear since 3 am. Constant pain. No drainage noted.

## 2012-12-18 NOTE — ED Provider Notes (Signed)
History     CSN: [id number]  Arrival date & time 12/18/12  0810   First MD Initiated Contact with Patient 12/18/12 [phone]      Chief Complaint  Patient presents with  . Otalgia    (Consider location/radiation/quality/duration/timing/severity/associated sxs/prior treatment) HPI Comments: Eugene Hicks is a 22 y.o. Male presenting with continued pain in his right ear.  He was seen here 5 days ago for similar complaint after using a q tip in the ear causing TM perforation,  However,  Also had recently been treated for an otitis media.  He did have drainage from the ear prior to his visit last week,  But none since.  He has used tramadol for pain which was not helpful and has been very careful to not get any water in the ear.  He reports decreased hearing acuity on the right.  He denies fevers or chills,  He does have occasional nasal congestion associated with "allergies" for which he uses prn Flonase.  He is scheduled to see an ENT in [place] in 3 days for further management of this injury.     The history is provided by the patient and a parent.    Past Medical History  Diagnosis Date  . ADHD (attention deficit hyperactivity disorder)   . Back pain   . Shoulder pain     History reviewed. No pertinent past surgical history.  Family History  Problem Relation Age of Onset  . Stroke Father     History  Substance Use Topics  . Smoking status: Never Smoker   . Smokeless tobacco: Not on file  . Alcohol Use: No      Review of Systems  Constitutional: Negative for fever.  HENT: Positive for hearing loss, ear pain, congestion and rhinorrhea. Negative for sore throat, neck pain, neck stiffness, tinnitus and ear discharge.   Eyes: Negative.   Respiratory: Negative for chest tightness and shortness of breath.   Cardiovascular: Negative for chest pain.  Gastrointestinal: Negative for nausea and abdominal pain.  Genitourinary: Negative.   Musculoskeletal: Negative for joint  swelling and arthralgias.  Skin: Negative.  Negative for rash and wound.  Neurological: Negative for dizziness, weakness, light-headedness, numbness and headaches.  Psychiatric/Behavioral: Negative.     Allergies  Codeine; Lactose intolerance (gi); Percocet; and Penicillins  Home Medications   Current Outpatient Rx  Name  Route  Sig  Dispense  Refill  . acetaminophen (TYLENOL) 500 MG tablet   Oral   Take 1,000 mg by mouth every 6 (six) hours as needed for pain.         Marland Kitchen alprazolam (XANAX) 2 MG tablet   Oral   Take 2 mg by mouth 4 (four) times daily as needed. For anxiety         . cyclobenzaprine (FLEXERIL) 10 MG tablet   Oral   Take 0.5 tablets (5 mg total) by mouth 3 (three) times daily as needed for muscle spasms.   15 tablet   0   . dicyclomine (BENTYL) 20 MG tablet   Oral   Take 1 tablet (20 mg total) by mouth 2 (two) times daily.   20 tablet   0   . fluticasone (FLONASE) 50 MCG/ACT nasal spray   Nasal   Place 2 sprays into the nose daily.           Marland Kitchen lithium carbonate 300 MG capsule   Oral   Take 300-600 mg by mouth 2 (two) times daily. Take one capsule (  300mg ) every day in the morning, and take two capsules (600mg ) every day at bedtime         . ondansetron (ZOFRAN) 4 MG tablet   Oral   Take 1 tablet (4 mg total) by mouth every 6 (six) hours.   12 tablet   0   . propranolol (INNOPRAN XL) 80 MG 24 hr capsule   Oral   Take 80 mg by mouth daily.          . QUEtiapine (SEROQUEL) 300 MG tablet   Oral   Take 300 mg by mouth at bedtime.          . traMADol (ULTRAM) 50 MG tablet   Oral   Take 1 tablet (50 mg total) by mouth every 6 (six) hours as needed for pain.   15 tablet   0   . HYDROcodone-acetaminophen (LORTAB) 7.5-500 MG/15ML solution   Oral   Take 10 mLs by mouth every 6 (six) hours as needed for pain.   100 mL   0     BP 119/70  Pulse 66  Temp(Src) 98.7 F (37.1 C) (Oral)  Resp 18  Ht 5\' 6"  (1.676 m)  Wt 145 lb (65.772  kg)  BMI 23.41 kg/m2  SpO2 100%  Physical Exam  Constitutional: He is oriented to person, place, and time. He appears well-developed and well-nourished.  HENT:  Head: Normocephalic and atraumatic.  Right Ear: Ear canal normal. No drainage or swelling. Tympanic membrane is perforated. Tympanic membrane is not injected. No hemotympanum. Decreased hearing is noted.  Left Ear: Tympanic membrane and ear canal normal.  Nose: Mucosal edema and rhinorrhea present.  Mouth/Throat: Uvula is midline, oropharynx is clear and moist and mucous membranes are normal. No oropharyngeal exudate, posterior oropharyngeal edema, posterior oropharyngeal erythema or tonsillar abscesses.  Lower right TM ruptured,  Upper edge appears intact.  Eyes: Conjunctivae are normal.  Cardiovascular: Normal rate and normal heart sounds.   Pulmonary/Chest: Effort normal. No respiratory distress.  Abdominal: Soft. There is no tenderness.  Musculoskeletal: Normal range of motion.  Neurological: He is alert and oriented to person, place, and time.  Skin: Skin is warm and dry. No rash noted.  Psychiatric: He has a normal mood and affect.    ED Course  Procedures (including critical care time)  Labs Reviewed - No data to display No results found.   1. Traumatic tympanic membrane perforation, right, subsequent encounter       MDM  The patient appears reasonably screened and/or stabilized for discharge and I doubt any other medical condition or other [facility] requiring further screening, evaluation, or treatment in the ED at this time prior to discharge. Pt was prescribed lortab elixer for pain relief.  Avoid getting water/fluids in the ear.  F/u with ENT in 3 days as scheduled.        Burgess Amor, [place] 12/18/12 [phone]

## 2012-12-23 ENCOUNTER — Encounter (HOSPITAL_COMMUNITY): Payer: Self-pay | Admitting: Emergency Medicine

## 2012-12-23 ENCOUNTER — Emergency Department (HOSPITAL_COMMUNITY): Payer: Medicaid Other

## 2012-12-23 ENCOUNTER — Emergency Department (HOSPITAL_COMMUNITY)
Admission: EM | Admit: 2012-12-23 | Discharge: 2012-12-23 | Disposition: A | Discharge status: Home / Self Care | Payer: Medicaid Other | Attending: Emergency Medicine | Admitting: Emergency Medicine

## 2012-12-23 DIAGNOSIS — J3489 Other specified disorders of nose and nasal sinuses: Secondary | ICD-10-CM | POA: Insufficient documentation

## 2012-12-23 DIAGNOSIS — J209 Acute bronchitis, unspecified: Secondary | ICD-10-CM | POA: Insufficient documentation

## 2012-12-23 DIAGNOSIS — Z79899 Other long term (current) drug therapy: Secondary | ICD-10-CM | POA: Insufficient documentation

## 2012-12-23 DIAGNOSIS — Z8659 Personal history of other mental and behavioral disorders: Secondary | ICD-10-CM | POA: Insufficient documentation

## 2012-12-23 MED ORDER — DOXYCYCLINE HYCLATE 100 MG PO CAPS: 100.0000 mg | ORAL_CAPSULE | Freq: Two times a day (BID) | ORAL | Status: DC

## 2012-12-23 MED ORDER — BENZONATATE 200 MG PO CAPS: 200.0000 mg | ORAL_CAPSULE | Freq: Three times a day (TID) | ORAL | Status: DC | PRN

## 2012-12-23 MED ORDER — DOXYCYCLINE HYCLATE 100 MG PO TABS
100.0000 mg | ORAL_TABLET | Freq: Once | ORAL | Status: AC
  Administered 2012-12-23: 100 mg via ORAL
  Filled 2012-12-23: qty 1

## 2012-12-23 NOTE — ED Notes (Signed)
Patient with no complaints at this time. Respirations even and unlabored. Skin warm/dry. Discharge instructions reviewed with patient at this time. Patient given opportunity to voice concerns/ask questions. Patient discharged at this time and left Emergency Department with steady gait.   

## 2012-12-23 NOTE — ED Notes (Signed)
Pt c/o productive cough- yellow sputum with streaks of blood.

## 2012-12-27 NOTE — ED Provider Notes (Signed)
History     CSN: [id number]  Arrival date & time 12/23/12  1245   First MD Initiated Contact with Patient 12/23/12 1316      Chief Complaint  Patient presents with  . Cough    (Consider location/radiation/quality/duration/timing/severity/associated sxs/prior treatment) Patient is a 22 y.o. male presenting with cough. The history is provided by the patient and a parent.  Cough Cough characteristics:  Productive Sputum characteristics:  Yellow (blood tinged) Severity:  Mild Onset quality:  Gradual Duration: onset yesterday. Timing:  Sporadic Progression:  Unchanged Chronicity:  New Smoker: no   Relieved by:  Nothing Worsened by:  Nothing tried Ineffective treatments:  None tried Associated symptoms: no chest pain, no chills, no diaphoresis, no ear pain, no fever, no headaches, no myalgias, no rash, no rhinorrhea, no shortness of breath, no sore throat and no wheezing     Past Medical History  Diagnosis Date  . ADHD (attention deficit hyperactivity disorder)   . Back pain   . Shoulder pain     History reviewed. No pertinent past surgical history.  Family History  Problem Relation Age of Onset  . Stroke Father     History  Substance Use Topics  . Smoking status: Never Smoker   . Smokeless tobacco: Not on file  . Alcohol Use: No      Review of Systems  Constitutional: Negative for fever, chills, diaphoresis, activity change and appetite change.  HENT: Positive for congestion. Negative for ear pain, sore throat, facial swelling, rhinorrhea, trouble swallowing, neck pain and neck stiffness.   Eyes: Negative for visual disturbance.  Respiratory: Positive for cough. Negative for chest tightness, shortness of breath, wheezing and stridor.   Cardiovascular: Negative for chest pain.  Gastrointestinal: Negative for nausea and vomiting.  Musculoskeletal: Negative for myalgias.  Skin: Negative.  Negative for rash.  Neurological: Negative for dizziness, weakness,  numbness and headaches.  Hematological: Negative for adenopathy.  Psychiatric/Behavioral: Negative for confusion.  All other systems reviewed and are negative.    Allergies  Codeine; Lactose intolerance (gi); Percocet; and Penicillins  Home Medications   Current Outpatient Rx  Name  Route  Sig  Dispense  Refill  . acetaminophen (TYLENOL) 500 MG tablet   Oral   Take 1,000 mg by mouth every 6 (six) hours as needed for pain.         Marland Kitchen alprazolam (XANAX) 2 MG tablet   Oral   Take 2 mg by mouth 4 (four) times daily as needed. For anxiety         . cyclobenzaprine (FLEXERIL) 10 MG tablet   Oral   Take 0.5 tablets (5 mg total) by mouth 3 (three) times daily as needed for muscle spasms.   15 tablet   0   . dicyclomine (BENTYL) 20 MG tablet   Oral   Take 1 tablet (20 mg total) by mouth 2 (two) times daily.   20 tablet   0   . fluticasone (FLONASE) 50 MCG/ACT nasal spray   Nasal   Place 2 sprays into the nose daily.           Marland Kitchen HYDROcodone-acetaminophen (LORTAB) 7.5-500 MG/15ML solution   Oral   Take 10 mLs by mouth every 6 (six) hours as needed for pain.   100 mL   0   . lithium carbonate 300 MG capsule   Oral   Take 300-600 mg by mouth 2 (two) times daily. Take one capsule (300mg ) every day in the morning, and take two  capsules (600mg ) every day at bedtime         . ondansetron (ZOFRAN) 4 MG tablet   Oral   Take 1 tablet (4 mg total) by mouth every 6 (six) hours.   12 tablet   0   . propranolol (INNOPRAN XL) 80 MG 24 hr capsule   Oral   Take 80 mg by mouth daily.          . QUEtiapine (SEROQUEL) 300 MG tablet   Oral   Take 300 mg by mouth at bedtime.          . traMADol (ULTRAM) 50 MG tablet   Oral   Take 1 tablet (50 mg total) by mouth every 6 (six) hours as needed for pain.   15 tablet   0   . benzonatate (TESSALON) 200 MG capsule   Oral   Take 1 capsule (200 mg total) by mouth 3 (three) times daily as needed for cough.   21 capsule   0    . doxycycline (VIBRAMYCIN) 100 MG capsule   Oral   Take 1 capsule (100 mg total) by mouth 2 (two) times daily.   20 capsule   0     BP 120/65  Pulse 69  Temp(Src) 97.9 F (36.6 C)  Resp 18  Ht 5\' 7"  (1.702 m)  Wt 145 lb (65.772 kg)  BMI 22.71 kg/m2  SpO2 100%  Physical Exam  Nursing note and vitals reviewed. Constitutional: He is oriented to person, place, and time. He appears well-developed and well-nourished. No distress.  HENT:  Head: Normocephalic and atraumatic.  Right Ear: Tympanic membrane and ear canal normal.  Left Ear: Tympanic membrane and ear canal normal.  Mouth/Throat: Uvula is midline, oropharynx is clear and moist and mucous membranes are normal. No oropharyngeal exudate.  Eyes: EOM are normal. Pupils are equal, round, and reactive to light.  Neck: Normal range of motion. Neck supple.  Cardiovascular: Normal rate, regular rhythm, normal heart sounds and intact distal pulses.   No murmur heard. Pulmonary/Chest: Effort normal. No respiratory distress. He has no rales. He exhibits no tenderness.  Coarse lungs sounds bilaterally.  No rales or wheezing  Musculoskeletal: He exhibits no edema.  Lymphadenopathy:    He has no cervical adenopathy.  Neurological: He is alert and oriented to person, place, and time. He exhibits normal muscle tone. Coordination normal.  Skin: Skin is warm and dry.    ED Course  Procedures (including critical care time)  Labs Reviewed - No data to display No results found.   Dg Chest 2 View  12/23/2012  *RADIOLOGY REPORT*  Clinical Data: Cough  CHEST - 2 VIEW  Comparison: 06/04/2012  Findings: Normal heart size.  Clear lungs.  Increased AP diameter of the chest and mild bronchitic changes.  IMPRESSION: No active cardiopulmonary disease.  Hyperaeration.   Original Report Authenticated By: Jolaine Click, M.D.     1. Bronchitis       MDM    Patient is alert, well appearing, vitals stable.  Occasional cough that is intermittently  productive of blood tinged sputum per history.  Likely bronchitis.  Doubt PE.  Will tx with doxy and tessalon perles.  Agrees to f/u with PMD if needed  The patient appears reasonably screened and/or stabilized for discharge and I doubt any other medical condition or other [facility] requiring further screening, evaluation, or treatment in the ED at this time prior to discharge.     Tammy L. Trisha Mangle, PA-C 12/27/12 1631

## 2013-01-02 NOTE — ED Provider Notes (Signed)
I personally performed the services described in this documentation, which was scribed in my presence. The recorded information has been reviewed and is accurate.  Brian Cook, MD 01/02/13 0735 

## 2013-01-21 ENCOUNTER — Emergency Department (HOSPITAL_COMMUNITY)
Admission: EM | Admit: 2013-01-21 | Discharge: 2013-01-21 | Disposition: A | Discharge status: Home / Self Care | Payer: Medicaid Other | Attending: Emergency Medicine | Admitting: Emergency Medicine

## 2013-01-21 ENCOUNTER — Encounter (HOSPITAL_COMMUNITY): Payer: Self-pay

## 2013-01-21 DIAGNOSIS — IMO0002 Reserved for concepts with insufficient information to code with codable children: Secondary | ICD-10-CM | POA: Insufficient documentation

## 2013-01-21 DIAGNOSIS — M545 Low back pain, unspecified: Secondary | ICD-10-CM | POA: Insufficient documentation

## 2013-01-21 DIAGNOSIS — F909 Attention-deficit hyperactivity disorder, unspecified type: Secondary | ICD-10-CM | POA: Insufficient documentation

## 2013-01-21 DIAGNOSIS — Z8739 Personal history of other diseases of the musculoskeletal system and connective tissue: Secondary | ICD-10-CM | POA: Insufficient documentation

## 2013-01-21 DIAGNOSIS — Z87828 Personal history of other (healed) physical injury and trauma: Secondary | ICD-10-CM | POA: Insufficient documentation

## 2013-01-21 DIAGNOSIS — Z79899 Other long term (current) drug therapy: Secondary | ICD-10-CM | POA: Insufficient documentation

## 2013-01-21 DIAGNOSIS — G8929 Other chronic pain: Secondary | ICD-10-CM | POA: Insufficient documentation

## 2013-01-21 MED ORDER — CYCLOBENZAPRINE HCL 10 MG PO TABS: 10.0000 mg | ORAL_TABLET | Freq: Two times a day (BID) | ORAL | Status: DC | PRN

## 2013-01-21 MED ORDER — TRAMADOL HCL 50 MG PO TABS: 50.0000 mg | ORAL_TABLET | Freq: Four times a day (QID) | ORAL | Status: DC | PRN

## 2013-01-21 NOTE — ED Notes (Signed)
Pt reports walking w/ this am w/ severe back pain.  H/o same for 10 years.

## 2013-01-21 NOTE — ED Provider Notes (Signed)
Medical screening examination/treatment/procedure(s) were performed by non-physician practitioner and as supervising physician I was immediately available for consultation/collaboration.   Carleene Cooper III, MD 01/21/13 Rickey Primus

## 2013-01-21 NOTE — ED Notes (Signed)
Ambulatory to room with steady gait. No injury. Chronic pain complaint for patient.
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
## 2013-02-10 ENCOUNTER — Encounter (HOSPITAL_COMMUNITY): Payer: Self-pay | Admitting: *Deleted

## 2013-02-10 ENCOUNTER — Emergency Department (HOSPITAL_COMMUNITY)
Admission: EM | Admit: 2013-02-10 | Discharge: 2013-02-10 | Disposition: A | Discharge status: Home / Self Care | Payer: Medicaid Other | Attending: Emergency Medicine | Admitting: Emergency Medicine

## 2013-02-10 DIAGNOSIS — S2095XA Superficial foreign body of unspecified parts of thorax, initial encounter: Secondary | ICD-10-CM | POA: Insufficient documentation

## 2013-02-10 DIAGNOSIS — Z79899 Other long term (current) drug therapy: Secondary | ICD-10-CM | POA: Insufficient documentation

## 2013-02-10 DIAGNOSIS — Y939 Activity, unspecified: Secondary | ICD-10-CM | POA: Insufficient documentation

## 2013-02-10 DIAGNOSIS — Y929 Unspecified place or not applicable: Secondary | ICD-10-CM | POA: Insufficient documentation

## 2013-02-10 DIAGNOSIS — Z8659 Personal history of other mental and behavioral disorders: Secondary | ICD-10-CM | POA: Insufficient documentation

## 2013-02-10 DIAGNOSIS — S30860A Insect bite (nonvenomous) of lower back and pelvis, initial encounter: Secondary | ICD-10-CM

## 2013-02-10 DIAGNOSIS — Z880 Allergy status to penicillin: Secondary | ICD-10-CM | POA: Insufficient documentation

## 2013-02-10 NOTE — ED Notes (Signed)
Pt has had mult tick bites in last 2 weeks.  His mother removed one from buttocks and may have left part of head in the skin

## 2013-02-11 NOTE — ED Provider Notes (Signed)
History     CSN: [id number]  Arrival date & time 02/10/13  1712   First MD Initiated Contact with Patient 02/10/13 1729      No chief complaint on file.   (Consider location/radiation/quality/duration/timing/severity/associated sxs/prior treatment) HPI Comments: Eugene Hicks is a 22 y.o. Male presents for evaluation of a tick bite site he is concerned he may have residual tick Parts remaining.  He has had multiple tics over the past several weeks, his mother removed the last one from his right buttock yesterday and there is a small knot at the site which is tender to palpation.  He denies fevers or chills, has had no headaches, no rash and no redness at the site of this bite.  He has had no other treatments for this condition.      The history is provided by the patient.    Past Medical History  Diagnosis Date  . ADHD (attention deficit hyperactivity disorder)   . Back pain   . Shoulder pain     History reviewed. No pertinent past surgical history.  Family History  Problem Relation Age of Onset  . Stroke Father     History  Substance Use Topics  . Smoking status: Never Smoker   . Smokeless tobacco: Not on file  . Alcohol Use: No      Review of Systems  Constitutional: Negative for fever and chills.  HENT: Negative for facial swelling.   Respiratory: Negative for shortness of breath and wheezing.   Skin: Negative for rash.  Neurological: Negative for numbness and headaches.    Allergies  Codeine; Lactose intolerance (gi); Percocet; and Penicillins  Home Medications   Current Outpatient Rx  Name  Route  Sig  Dispense  Refill  . acetaminophen (TYLENOL) 500 MG tablet   Oral   Take 1,000 mg by mouth every 6 (six) hours as needed for pain.         Marland Kitchen alprazolam (XANAX) 2 MG tablet   Oral   Take 2 mg by mouth 4 (four) times daily as needed. For anxiety         . cyclobenzaprine (FLEXERIL) 10 MG tablet   Oral   Take 1 tablet (10 mg total) by mouth 2  (two) times daily as needed for muscle spasms.   10 tablet   0   . dicyclomine (BENTYL) 20 MG tablet   Oral   Take 1 tablet (20 mg total) by mouth 2 (two) times daily.   20 tablet   0   . fluticasone (FLONASE) 50 MCG/ACT nasal spray   Nasal   Place 2 sprays into the nose daily.           Marland Kitchen lithium carbonate 300 MG capsule   Oral   Take 300-600 mg by mouth 2 (two) times daily. Take one capsule (300mg ) every day in the morning, and take two capsules (600mg ) every day at bedtime         . propranolol (INNOPRAN XL) 80 MG 24 hr capsule   Oral   Take 80 mg by mouth daily.          . QUEtiapine (SEROQUEL) 300 MG tablet   Oral   Take 300 mg by mouth at bedtime.          . traMADol (ULTRAM) 50 MG tablet   Oral   Take 1 tablet (50 mg total) by mouth every 6 (six) hours as needed for pain.   10 tablet   0  BP 98/60  Pulse 60  Temp(Src) 97.9 F (36.6 C) (Oral)  Resp 18  Ht 5\' 6"  (1.676 m)  Wt 140 lb (63.504 kg)  BMI 22.61 kg/m2  SpO2 100%  Physical Exam  Constitutional: He appears well-developed and well-nourished. No distress.  HENT:  Head: Normocephalic.  Neck: Neck supple.  Cardiovascular: Normal rate.   Pulmonary/Chest: Effort normal. He has no wheezes.  Musculoskeletal: Normal range of motion. He exhibits no edema.  Skin: No rash noted. No erythema.  Small scab at the site of tick.  No surrounding erythema or edema, no remaining tick piece visualized using magnification.    ED Course  Procedures (including critical care time)  Labs Reviewed - No data to display No results found.   1. Tick bite of buttock, initial encounter       MDM  Reassurance given.  Suggested warm tub soaks or warm compresses which may help resolve the  irritation.  Followup with PCP when necessary        Burgess Amor, PA-C 02/11/13 4098

## 2013-02-12 NOTE — ED Provider Notes (Signed)
Medical screening examination/treatment/procedure(s) were performed by non-physician practitioner and as supervising physician I was immediately available for consultation/collaboration.  Domenica Weightman L Terique Kawabata, MD 02/12/13 0203 

## 2013-02-22 ENCOUNTER — Emergency Department (HOSPITAL_COMMUNITY)
Admission: EM | Admit: 2013-02-22 | Discharge: 2013-02-22 | Disposition: A | Discharge status: Home / Self Care | Payer: Medicaid Other | Attending: Emergency Medicine | Admitting: Emergency Medicine

## 2013-02-22 ENCOUNTER — Encounter (HOSPITAL_COMMUNITY): Payer: Self-pay | Admitting: Emergency Medicine

## 2013-02-22 DIAGNOSIS — Z880 Allergy status to penicillin: Secondary | ICD-10-CM | POA: Insufficient documentation

## 2013-02-22 DIAGNOSIS — Y929 Unspecified place or not applicable: Secondary | ICD-10-CM | POA: Insufficient documentation

## 2013-02-22 DIAGNOSIS — S61219A Laceration without foreign body of unspecified finger without damage to nail, initial encounter: Secondary | ICD-10-CM

## 2013-02-22 DIAGNOSIS — Z79899 Other long term (current) drug therapy: Secondary | ICD-10-CM | POA: Insufficient documentation

## 2013-02-22 DIAGNOSIS — Y9389 Activity, other specified: Secondary | ICD-10-CM | POA: Insufficient documentation

## 2013-02-22 DIAGNOSIS — S61209A Unspecified open wound of unspecified finger without damage to nail, initial encounter: Secondary | ICD-10-CM | POA: Insufficient documentation

## 2013-02-22 DIAGNOSIS — Z8659 Personal history of other mental and behavioral disorders: Secondary | ICD-10-CM | POA: Insufficient documentation

## 2013-02-22 DIAGNOSIS — W298XXA Contact with other powered powered hand tools and household machinery, initial encounter: Secondary | ICD-10-CM | POA: Insufficient documentation

## 2013-02-22 MED ORDER — DOUBLE ANTIBIOTIC 500-10000 UNIT/GM EX OINT
TOPICAL_OINTMENT | Freq: Once | CUTANEOUS | Status: AC
  Administered 2013-02-22: 12:00:00 via TOPICAL
  Filled 2013-02-22: qty 1

## 2013-02-22 NOTE — ED Notes (Signed)
Patient reports cutting his right fifth finger on alternator this morning. States tetanus shot is UTD. Bleeding controlled.

## 2013-02-22 NOTE — ED Provider Notes (Signed)
History     CSN: [id number]  Arrival date & time 02/22/13  1012   First MD Initiated Contact with Patient 02/22/13 1044      Chief Complaint  Patient presents with  . Laceration    (Consider location/radiation/quality/duration/timing/severity/associated sxs/prior treatment) HPI Eugene Hicks is a 22 y.o. male who presents to the ED with a laceration to the right little finger. He was helping his father with an alternator this morning and cut his finger. The history was provided by the patient and his father.  Past Medical History  Diagnosis Date  . ADHD (attention deficit hyperactivity disorder)   . Back pain   . Shoulder pain     History reviewed. No pertinent past surgical history.  Family History  Problem Relation Age of Onset  . Stroke Father     History  Substance Use Topics  . Smoking status: Never Smoker   . Smokeless tobacco: Not on file  . Alcohol Use: No      Review of Systems  Constitutional: Negative for activity change.  HENT: Negative for neck pain.   Gastrointestinal: Negative for nausea and vomiting.  Musculoskeletal:       Finger laceration  Skin: Positive for wound.  Neurological: Negative for headaches.  Psychiatric/Behavioral: The patient is not nervous/anxious.     Allergies  Codeine; Lactose intolerance (gi); Percocet; and Penicillins  Home Medications   Current Outpatient Rx  Name  Route  Sig  Dispense  Refill  . acetaminophen (TYLENOL) 500 MG tablet   Oral   Take 1,000 mg by mouth every 6 (six) hours as needed for pain.         Marland Kitchen alprazolam (XANAX) 2 MG tablet   Oral   Take 2 mg by mouth 4 (four) times daily as needed. For anxiety         . dicyclomine (BENTYL) 20 MG tablet   Oral   Take 1 tablet (20 mg total) by mouth 2 (two) times daily.   20 tablet   0   . fluticasone (FLONASE) 50 MCG/ACT nasal spray   Nasal   Place 2 sprays into the nose daily.           Marland Kitchen lithium carbonate 300 MG capsule   Oral   Take  300-600 mg by mouth 2 (two) times daily. Take one capsule (300mg ) every day in the morning, and take two capsules (600mg ) every day at bedtime         . propranolol (INNOPRAN XL) 80 MG 24 hr capsule   Oral   Take 80 mg by mouth daily.          . QUEtiapine (SEROQUEL) 300 MG tablet   Oral   Take 300 mg by mouth at bedtime.            BP 104/58  Pulse 54  Temp(Src) 98.2 F (36.8 C) (Oral)  Resp 16  Ht 5\' 6"  (1.676 m)  Wt 140 lb (63.504 kg)  BMI 22.61 kg/m2  SpO2 100%  Physical Exam  Nursing note and vitals reviewed. Constitutional: He is oriented to person, place, and time. He appears well-developed and well-nourished. No distress.  HENT:  Head: Atraumatic.  Eyes: EOM are normal.  Neck: Neck supple.  Cardiovascular: Normal rate.   Pulmonary/Chest: Effort normal.  Musculoskeletal:       Right hand: He exhibits laceration. He exhibits normal range of motion, no tenderness, no deformity and no swelling.       Hands:  Avulsion laceration to the right hand 5th digit at the base of the nail bed. Bleeding controlled.  Neurological: He is alert and oriented to person, place, and time.  Psychiatric: He has a normal mood and affect.   Assessment: 22 y.o. male with laceration to the right hand 5th digit  Plan:  Clean the wound   Bacitracin ointment and dressing   Tetanus is UTD   Follow up with PCP, return here as needed ED Course  Procedures (including critical care time)  MDM  Discussed with the patient and his father plan of care. All questioned fully answered. He will return for any signs of infection.         [name], [place] 02/22/13 [phone]

## 2013-02-22 NOTE — ED Notes (Signed)
Patient with no complaints at this time. Respirations even and unlabored. Skin warm/dry. Discharge instructions reviewed with patient at this time. Patient given opportunity to voice concerns/ask questions. Patient discharged at this time and left Emergency Department with steady gait.   

## 2013-02-27 NOTE — ED Provider Notes (Signed)
Medical screening examination/treatment/procedure(s) were performed by non-physician practitioner and as supervising physician I was immediately available for consultation/collaboration.   Shelda Jakes, MD 02/27/13 [phone]

## 2013-03-01 ENCOUNTER — Encounter (HOSPITAL_COMMUNITY): Payer: Self-pay

## 2013-03-01 ENCOUNTER — Emergency Department (HOSPITAL_COMMUNITY): Payer: Medicaid Other

## 2013-03-01 ENCOUNTER — Emergency Department (HOSPITAL_COMMUNITY)
Admission: EM | Admit: 2013-03-01 | Discharge: 2013-03-01 | Disposition: A | Discharge status: Home / Self Care | Payer: Medicaid Other | Attending: Emergency Medicine | Admitting: Emergency Medicine

## 2013-03-01 DIAGNOSIS — S91332A Puncture wound without foreign body, left foot, initial encounter: Secondary | ICD-10-CM

## 2013-03-01 DIAGNOSIS — Z8659 Personal history of other mental and behavioral disorders: Secondary | ICD-10-CM | POA: Insufficient documentation

## 2013-03-01 DIAGNOSIS — W268XXA Contact with other sharp object(s), not elsewhere classified, initial encounter: Secondary | ICD-10-CM | POA: Insufficient documentation

## 2013-03-01 DIAGNOSIS — Y929 Unspecified place or not applicable: Secondary | ICD-10-CM | POA: Insufficient documentation

## 2013-03-01 DIAGNOSIS — Z79899 Other long term (current) drug therapy: Secondary | ICD-10-CM | POA: Insufficient documentation

## 2013-03-01 DIAGNOSIS — S91309A Unspecified open wound, unspecified foot, initial encounter: Secondary | ICD-10-CM | POA: Insufficient documentation

## 2013-03-01 DIAGNOSIS — Y939 Activity, unspecified: Secondary | ICD-10-CM | POA: Insufficient documentation

## 2013-03-01 DIAGNOSIS — Z880 Allergy status to penicillin: Secondary | ICD-10-CM | POA: Insufficient documentation

## 2013-03-01 MED ORDER — HYDROCODONE-ACETAMINOPHEN 5-325 MG PO TABS
1.0000 | ORAL_TABLET | Freq: Once | ORAL | Status: AC
  Administered 2013-03-01: 1 via ORAL
  Filled 2013-03-01: qty 1

## 2013-03-01 MED ORDER — IBUPROFEN 400 MG PO TABS
600.0000 mg | ORAL_TABLET | Freq: Once | ORAL | Status: AC
  Administered 2013-03-01: 600 mg via ORAL
  Filled 2013-03-01: qty 2

## 2013-03-01 NOTE — ED Provider Notes (Signed)
History    This chart was scribed for Raeford Razor, MD, MD by Concha Se, ED Scribe. The patient was seen in room APFT24/APFT24 and the patient's care was started at 3:31PM.    CSN: [id number]  Arrival date & time 03/01/13  1434    Chief Complaint  Patient presents with  . Foot Pain   The history is provided by the patient and medical records. No language interpreter was used.   HPI Comments: Eugene Hicks is a 22 y.o. male who presents to the Emergency Department complaining of constant, moderate left  foot pain after stepping on a nail an hour ago. Pt states he was wearing tennis shoes and socks. Pt  last tetanus was last year. Pt denies fever, chills, nausea, vomiting, diarrhea, weakness, cough, SOB and any other pain.   Past Medical History  Diagnosis Date  . ADHD (attention deficit hyperactivity disorder)   . Back pain   . Shoulder pain     History reviewed. No pertinent past surgical history.  Family History  Problem Relation Age of Onset  . Stroke Father     History  Substance Use Topics  . Smoking status: Never Smoker   . Smokeless tobacco: Not on file  . Alcohol Use: No      Review of Systems A complete 10 system review of systems was obtained and all systems are negative except as noted in the HPI and PMH.   Allergies  Codeine; Lactose intolerance (gi); Percocet; and Penicillins  Home Medications   Current Outpatient Rx  Name  Route  Sig  Dispense  Refill  . acetaminophen (TYLENOL) 500 MG tablet   Oral   Take 1,000 mg by mouth every 6 (six) hours as needed for pain.         Marland Kitchen alprazolam (XANAX) 2 MG tablet   Oral   Take 2 mg by mouth 4 (four) times daily as needed. For anxiety         . fluticasone (FLONASE) 50 MCG/ACT nasal spray   Nasal   Place 2 sprays into the nose daily.           Marland Kitchen lithium carbonate 300 MG capsule   Oral   Take 300-600 mg by mouth 2 (two) times daily. Take one capsule (300mg ) every day in the morning, and  take two capsules (600mg ) every day at bedtime         . propranolol (INNOPRAN XL) 80 MG 24 hr capsule   Oral   Take 80 mg by mouth daily.          . QUEtiapine (SEROQUEL) 300 MG tablet   Oral   Take 300 mg by mouth at bedtime.            BP 95/61  Pulse 68  Temp(Src) 97.9 F (36.6 C) (Oral)  Resp 18  Ht 5\' 7"  (1.702 m)  Wt 140 lb (63.504 kg)  BMI 21.92 kg/m2  SpO2 100%  Physical Exam  Nursing note and vitals reviewed. Constitutional: He is oriented to person, place, and time. He appears well-developed and well-nourished. No distress.  HENT:  Head: Normocephalic and atraumatic.  Eyes: EOM are normal.  Neck: Neck supple. No tracheal deviation present.  Cardiovascular: Normal rate.   Pulmonary/Chest: Effort normal. No respiratory distress.  Musculoskeletal: Normal range of motion.  Small puncture wound to plantar aspect of the left foot near head of 2nd metatarsal. No active bleeding. No gross contamination. NVI.  Neurological: He is alert  and oriented to person, place, and time.  Skin: Skin is warm and dry.  Psychiatric: He has a normal mood and affect. His behavior is normal.    ED Course  Irrigation Date/Time: 03/02/2013 4:00 PM Performed by: Raeford Razor Authorized by: Raeford Razor Consent: Verbal consent obtained. Patient identity confirmed: verbally with patient and provided demographic data Patient sedated: no Patient tolerance: Patient tolerated the procedure well with no immediate complications. Comments: Puncture wound L foot copiously irrigated    (including critical care time) DIAGNOSTIC STUDIES: Oxygen Saturation is 100% on room air, normal by my interpretation.    COORDINATION OF CARE: 3:35 PM Discussed ED treatment with pt and pt agrees to wound care and agrees to wearing clean socks. Advise pt that TD will not be updated because it has been a year.   Labs Reviewed - No data to display Dg Foot Complete Left  03/01/2013   *RADIOLOGY  REPORT*  Clinical Data: Left foot pain.  The patient stepped on nail.  LEFT FOOT - COMPLETE 3+ VIEW  Comparison: None.  Findings: The fourth and fifth toes are overlapping.  There is soft tissue swelling over the fifth MTP joint.  There is no fracture. No radiopaque foreign body.  Soft tissues appear within normal limits.  IMPRESSION: No acute osseous abnormality.  Soft tissue swelling over the fifth MTP joint.   Original Report Authenticated By: Andreas Newport, M.D.     1. Puncture wound of foot, left, initial encounter       MDM  22yM with puncture wound to foot. No gross contamination. Irrigated. Continued wound care discussed. Tetanus current.       I personally preformed the services scribed in my presence. The recorded information has been reviewed is accurate. Raeford Razor, MD.   Raeford Razor, MD 03/04/13 [phone]

## 2013-03-01 NOTE — ED Notes (Signed)
Pt stepped on a nail, now having left foot pain, unsure of last tetanus

## 2013-04-22 ENCOUNTER — Encounter (HOSPITAL_COMMUNITY): Payer: Self-pay

## 2013-04-22 ENCOUNTER — Emergency Department (HOSPITAL_COMMUNITY)
Admission: EM | Admit: 2013-04-22 | Discharge: 2013-04-22 | Disposition: A | Discharge status: Home / Self Care | Payer: Medicaid Other | Attending: Emergency Medicine | Admitting: Emergency Medicine

## 2013-04-22 DIAGNOSIS — H6122 Impacted cerumen, left ear: Secondary | ICD-10-CM

## 2013-04-22 DIAGNOSIS — H6092 Unspecified otitis externa, left ear: Secondary | ICD-10-CM

## 2013-04-22 DIAGNOSIS — F909 Attention-deficit hyperactivity disorder, unspecified type: Secondary | ICD-10-CM | POA: Insufficient documentation

## 2013-04-22 DIAGNOSIS — H9209 Otalgia, unspecified ear: Secondary | ICD-10-CM | POA: Insufficient documentation

## 2013-04-22 DIAGNOSIS — H6120 Impacted cerumen, unspecified ear: Secondary | ICD-10-CM | POA: Insufficient documentation

## 2013-04-22 DIAGNOSIS — Z79899 Other long term (current) drug therapy: Secondary | ICD-10-CM | POA: Insufficient documentation

## 2013-04-22 DIAGNOSIS — IMO0002 Reserved for concepts with insufficient information to code with codable children: Secondary | ICD-10-CM | POA: Insufficient documentation

## 2013-04-22 DIAGNOSIS — Z8739 Personal history of other diseases of the musculoskeletal system and connective tissue: Secondary | ICD-10-CM | POA: Insufficient documentation

## 2013-04-22 DIAGNOSIS — H60399 Other infective otitis externa, unspecified ear: Secondary | ICD-10-CM | POA: Insufficient documentation

## 2013-04-22 MED ORDER — NEOMYCIN-POLYMYXIN-HC 3.5-10000-1 OT SOLN
OTIC | Status: AC
  Administered 2013-04-22: 3 [drp]
  Filled 2013-04-22: qty 10

## 2013-04-22 MED ORDER — HYDROCODONE-ACETAMINOPHEN 5-325 MG PO TABS: ORAL_TABLET | ORAL | Status: DC

## 2013-04-22 MED ORDER — HYDROCODONE-ACETAMINOPHEN 5-325 MG PO TABS
1.0000 | ORAL_TABLET | Freq: Once | ORAL | Status: AC
  Administered 2013-04-22: 1 via ORAL
  Filled 2013-04-22: qty 1

## 2013-04-22 MED ORDER — NEOMYCIN-POLYMYXIN-HC 3.5-10000-1 OT SUSP
4.0000 [drp] | Freq: Four times a day (QID) | OTIC | Status: DC
  Filled 2013-04-22: qty 10

## 2013-04-22 NOTE — ED Provider Notes (Signed)
History    CSN: [id number] Arrival date & time 04/22/13  1334  First MD Initiated Contact with Patient 04/22/13 1425     Chief Complaint  Patient presents with  . Cerumen Impaction    HPI Pt was seen at 1425.  Per pt and his father, c/o gradual onset and persistence of constant left ear "pain" for the past 2 days. Pt's father states "he gets like this when there's wax in it."  Denies ear injury, denies placing FB in ear, no bleeding, no hearing loss, no fevers.      Past Medical History  Diagnosis Date  . ADHD (attention deficit hyperactivity disorder)   . Back pain   . Shoulder pain    History reviewed. No pertinent past surgical history.  Family History  Problem Relation Age of Onset  . Stroke Father    History  Substance Use Topics  . Smoking status: Never Smoker   . Smokeless tobacco: Not on file  . Alcohol Use: No    Review of Systems ROS: Statement: All systems negative except as marked or noted in the HPI; Constitutional: Negative for fever and chills. ; ; Eyes: Negative for eye pain, redness and discharge. ; ; ENMT: +left ear pain. Negative for hoarseness, nasal congestion, sinus pressure and sore throat. ; ; Cardiovascular: Negative for chest pain, palpitations, diaphoresis, dyspnea and peripheral edema. ; ; Respiratory: Negative for cough, wheezing and stridor. ; ; Gastrointestinal: Negative for nausea, vomiting, diarrhea, abdominal pain, blood in stool, hematemesis, jaundice and rectal bleeding. . ; ; Genitourinary: Negative for dysuria, flank pain and hematuria. ; ; Musculoskeletal: Negative for back pain and neck pain. Negative for swelling and trauma.; ; Skin: Negative for pruritus, rash, abrasions, blisters, bruising and skin lesion.; ; Neuro: Negative for headache, lightheadedness and neck stiffness. Negative for weakness, altered level of consciousness , altered mental status, extremity weakness, paresthesias, involuntary movement, seizure and syncope.      Allergies  Codeine; Lactose intolerance (gi); Percocet; and Penicillins  Home Medications   Current Outpatient Rx  Name  Route  Sig  Dispense  Refill  . acetaminophen (TYLENOL) 500 MG tablet   Oral   Take 1,000 mg by mouth every 6 (six) hours as needed for pain.         Marland Kitchen alprazolam (XANAX) 2 MG tablet   Oral   Take 2 mg by mouth 4 (four) times daily as needed. For anxiety         . fluticasone (FLONASE) 50 MCG/ACT nasal spray   Nasal   Place 2 sprays into the nose daily.           Marland Kitchen lithium carbonate 300 MG capsule   Oral   Take 300-600 mg by mouth 2 (two) times daily. Take one capsule (300mg ) every day in the morning, and take two capsules (600mg ) every day at bedtime         . propranolol (INNOPRAN XL) 80 MG 24 hr capsule   Oral   Take 80 mg by mouth daily.          . QUEtiapine (SEROQUEL) 300 MG tablet   Oral   Take 300 mg by mouth at bedtime.          Marland Kitchen HYDROcodone-acetaminophen (NORCO/VICODIN) 5-325 MG per tablet      1 tab PO q6 hours prn pain   8 tablet   0    BP 98/49  Pulse 46  Temp(Src) 98 F (36.7 C) (Oral)  Resp 16  Ht 5\' 6"  (1.676 m)  Wt 137 lb 4 oz (62.256 kg)  BMI 22.16 kg/m2  SpO2 100% Physical Exam 1430: Physical examination:  Nursing notes reviewed; Vital signs and O2 SAT reviewed;  Constitutional: Well developed, Well nourished, Well hydrated, In no acute distress; Head:  Normocephalic, atraumatic; Eyes: EOMI, PERRL, No scleral icterus; ENMT: TM's intact and clear bilat. +left ear canal with mild edema and debris, no open wounds, no bleeding. Mouth and pharynx normal, Mucous membranes moist; Neck: Supple, Full range of motion, No lymphadenopathy; Cardiovascular: Regular rate and rhythm, No murmur, rub, or gallop; Respiratory: Breath sounds clear & equal bilaterally, No rales, rhonchi, wheezes.  Speaking full sentences with ease, Normal respiratory effort/excursion; Chest: Nontender, Movement normal; Abdomen: Soft, Nontender,  Nondistended, Normal bowel sounds; Genitourinary: No CVA tenderness; Extremities: Pulses normal, No tenderness, No edema, No calf edema or asymmetry.; Neuro: AA&Ox3, Major CN grossly intact.  Speech clear. Climbs on and off stretcher easily by himself. Gait steady. No gross focal motor or sensory deficits in extremities.; Skin: Color normal, Warm, Dry.   ED Course  Procedures    MDM  MDM Reviewed: previous chart, nursing note and vitals   1500: Left ear cerumen carefully irrigated out by ED RN. Left TM visualized and is intact without erythema. Left ear canal is mildly edematous with small amount of debris; will tx for otitis externa. Pt's father request "a rx for some vicodin because he is suffering and the tylenol is not working."  Aware I will only rx very limited amount; verb understanding.     Laray Anger, DO 04/23/13 [phone]

## 2013-04-22 NOTE — ED Notes (Signed)
Complain of left ear pain and wax

## 2013-05-06 ENCOUNTER — Encounter (HOSPITAL_COMMUNITY): Payer: Self-pay | Admitting: *Deleted

## 2013-05-06 ENCOUNTER — Emergency Department (HOSPITAL_COMMUNITY)
Admission: EM | Admit: 2013-05-06 | Discharge: 2013-05-06 | Disposition: A | Discharge status: Home / Self Care | Payer: Medicaid Other | Attending: Emergency Medicine | Admitting: Emergency Medicine

## 2013-05-06 DIAGNOSIS — Z79899 Other long term (current) drug therapy: Secondary | ICD-10-CM | POA: Insufficient documentation

## 2013-05-06 DIAGNOSIS — S39012A Strain of muscle, fascia and tendon of lower back, initial encounter: Secondary | ICD-10-CM

## 2013-05-06 DIAGNOSIS — Z87828 Personal history of other (healed) physical injury and trauma: Secondary | ICD-10-CM | POA: Insufficient documentation

## 2013-05-06 DIAGNOSIS — S335XXA Sprain of ligaments of lumbar spine, initial encounter: Secondary | ICD-10-CM | POA: Insufficient documentation

## 2013-05-06 DIAGNOSIS — IMO0002 Reserved for concepts with insufficient information to code with codable children: Secondary | ICD-10-CM | POA: Insufficient documentation

## 2013-05-06 DIAGNOSIS — X500XXA Overexertion from strenuous movement or load, initial encounter: Secondary | ICD-10-CM | POA: Insufficient documentation

## 2013-05-06 DIAGNOSIS — Y9389 Activity, other specified: Secondary | ICD-10-CM | POA: Insufficient documentation

## 2013-05-06 DIAGNOSIS — Z8659 Personal history of other mental and behavioral disorders: Secondary | ICD-10-CM | POA: Insufficient documentation

## 2013-05-06 DIAGNOSIS — G8929 Other chronic pain: Secondary | ICD-10-CM | POA: Insufficient documentation

## 2013-05-06 DIAGNOSIS — Z880 Allergy status to penicillin: Secondary | ICD-10-CM | POA: Insufficient documentation

## 2013-05-06 DIAGNOSIS — Y92009 Unspecified place in unspecified non-institutional (private) residence as the place of occurrence of the external cause: Secondary | ICD-10-CM | POA: Insufficient documentation

## 2013-05-06 MED ORDER — HYDROCODONE-ACETAMINOPHEN 5-325 MG PO TABS
1.0000 | ORAL_TABLET | Freq: Once | ORAL | Status: AC
  Administered 2013-05-06: 1 via ORAL
  Filled 2013-05-06: qty 1

## 2013-05-06 MED ORDER — CYCLOBENZAPRINE HCL 10 MG PO TABS: 10.0000 mg | ORAL_TABLET | Freq: Two times a day (BID) | ORAL | Status: DC | PRN

## 2013-05-06 MED ORDER — HYDROCODONE-ACETAMINOPHEN 5-325 MG PO TABS: 1.0000 | ORAL_TABLET | ORAL | Status: DC | PRN
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
## 2013-05-11 NOTE — ED Provider Notes (Signed)
Medical screening examination/treatment/procedure(s) were performed by non-physician practitioner and as supervising physician I was immediately available for consultation/collaboration.  Eaton Folmar, MD 05/11/13 2235 

## 2013-05-20 ENCOUNTER — Emergency Department (HOSPITAL_COMMUNITY)
Admission: EM | Admit: 2013-05-20 | Discharge: 2013-05-20 | Disposition: A | Discharge status: Home / Self Care | Payer: Medicaid Other | Attending: Emergency Medicine | Admitting: Emergency Medicine

## 2013-05-20 ENCOUNTER — Encounter (HOSPITAL_COMMUNITY): Payer: Self-pay

## 2013-05-20 DIAGNOSIS — F909 Attention-deficit hyperactivity disorder, unspecified type: Secondary | ICD-10-CM | POA: Insufficient documentation

## 2013-05-20 DIAGNOSIS — Z8739 Personal history of other diseases of the musculoskeletal system and connective tissue: Secondary | ICD-10-CM | POA: Insufficient documentation

## 2013-05-20 DIAGNOSIS — Z79899 Other long term (current) drug therapy: Secondary | ICD-10-CM | POA: Insufficient documentation

## 2013-05-20 DIAGNOSIS — IMO0002 Reserved for concepts with insufficient information to code with codable children: Secondary | ICD-10-CM | POA: Insufficient documentation

## 2013-05-20 DIAGNOSIS — K089 Disorder of teeth and supporting structures, unspecified: Secondary | ICD-10-CM | POA: Insufficient documentation

## 2013-05-20 DIAGNOSIS — K0889 Other specified disorders of teeth and supporting structures: Secondary | ICD-10-CM

## 2013-05-20 MED ORDER — IBUPROFEN 600 MG PO TABS: 600.0000 mg | ORAL_TABLET | Freq: Four times a day (QID) | ORAL | Status: DC | PRN

## 2013-05-20 NOTE — ED Notes (Signed)
Pt c/o pain to multiple teeth. States that he has poor dental hygiene due to his medical condition of adhd. Has appointment with oral surgeon in [place] on aug 18th or 20th. Per father in room with pt.

## 2013-05-20 NOTE — ED Notes (Signed)
Per father, pt was eating a cookie and broke a tooth

## 2013-05-20 NOTE — ED Provider Notes (Signed)
CSN: [id number]     Arrival date & time 05/20/13  1335 History    2:30     Chief Complaint  Patient presents with  . Dental Pain   (Consider location/radiation/quality/duration/timing/severity/associated sxs/prior Treatment) HPI  HPI Comments: Eugene Hicks is a 22 y.o. male who presents to the Emergency Department for diffuse dental pain and questionable tooth fracture after eating candy. Pt has appointment with oral surgeon for extraction of all of his teeth. Pt does not currently c/o pain though father is asking for pain medication for son.  Past Medical History  Diagnosis Date  . ADHD (attention deficit hyperactivity disorder)   . Back pain   . Shoulder pain    History reviewed. No pertinent past surgical history. Family History  Problem Relation Age of Onset  . Stroke Father    History  Substance Use Topics  . Smoking status: Never Smoker   . Smokeless tobacco: Not on file  . Alcohol Use: No    Review of Systems  Constitutional: Negative for fever and chills.  HENT: Positive for dental problem. Negative for sore throat and trouble swallowing.   Gastrointestinal: Negative for nausea, vomiting and abdominal pain.  Skin: Negative for rash.  All other systems reviewed and are negative.    Allergies  Codeine; Lactose intolerance (gi); Percocet; and Penicillins  Home Medications   Current Outpatient Rx  Name  Route  Sig  Dispense  Refill  . acetaminophen (TYLENOL) 500 MG tablet   Oral   Take 1,000 mg by mouth every 6 (six) hours as needed for pain.         Marland Kitchen alprazolam (XANAX) 2 MG tablet   Oral   Take 2 mg by mouth 4 (four) times daily as needed. For anxiety         . cyclobenzaprine (FLEXERIL) 10 MG tablet   Oral   Take 1 tablet (10 mg total) by mouth 2 (two) times daily as needed for muscle spasms.   20 tablet   0   . fluticasone (FLONASE) 50 MCG/ACT nasal spray   Nasal   Place 2 sprays into the nose daily.           Marland Kitchen lithium carbonate 300  MG capsule   Oral   Take 300-600 mg by mouth 2 (two) times daily. Take one capsule (300mg ) every day in the morning, and take two capsules (600mg ) every day at bedtime         . propranolol (INNOPRAN XL) 80 MG 24 hr capsule   Oral   Take 80 mg by mouth daily.          . QUEtiapine (SEROQUEL) 300 MG tablet   Oral   Take 300 mg by mouth at bedtime.          Marland Kitchen HYDROcodone-acetaminophen (NORCO/VICODIN) 5-325 MG per tablet   Oral   Take 1 tablet by mouth every 4 (four) hours as needed.   15 tablet   0   . ibuprofen (ADVIL,MOTRIN) 600 MG tablet   Oral   Take 1 tablet (600 mg total) by mouth every 6 (six) hours as needed for pain.   30 tablet   0    BP 96/61  Pulse 63  Temp(Src) 98.1 F (36.7 C) (Oral)  Resp 18  Ht 5\' 6"  (1.676 m)  Wt 135 lb (61.236 kg)  BMI 21.8 kg/m2  SpO2 100% Physical Exam  Nursing note and vitals reviewed. Constitutional: He is oriented to person, place, and  time. He appears well-developed and well-nourished. No distress.  HENT:  Head: Normocephalic and atraumatic.  Right Ear: External ear normal.  Left Ear: External ear normal.  Mouth/Throat: Oropharynx is clear and moist.  Diffuse poor dentition without focal pain, swelling   Eyes: Conjunctivae and EOM are normal. Pupils are equal, round, and reactive to light.  Neck: Normal range of motion. Neck supple.  Cardiovascular: Normal rate, regular rhythm, normal heart sounds and intact distal pulses.   Pulmonary/Chest: Effort normal and breath sounds normal. No respiratory distress. He has no wheezes. He has no rales.  Abdominal: Soft. Bowel sounds are normal.  Musculoskeletal: Normal range of motion. He exhibits no edema and no tenderness.  Lymphadenopathy:    He has no cervical adenopathy.  Neurological: He is alert and oriented to person, place, and time.  Skin: Skin is warm and dry. No rash noted. No erythema.  Psychiatric: He has a normal mood and affect. His behavior is normal.    ED  Course   Procedures (including critical care time)  Labs Reviewed - No data to display No results found. 1. Pain, dental     MDM  I personally performed the services described in this documentation, which was scribed in my presence. The recorded information has been reviewed and is accurate.  Advised to f/u with oral surgeon. Return precautions given.   Loren Racer, MD 05/21/13 2145

## 2013-05-31 ENCOUNTER — Encounter (HOSPITAL_COMMUNITY): Payer: Self-pay | Admitting: *Deleted

## 2013-05-31 ENCOUNTER — Emergency Department (HOSPITAL_COMMUNITY): Payer: Medicaid Other

## 2013-05-31 ENCOUNTER — Emergency Department (HOSPITAL_COMMUNITY)
Admission: EM | Admit: 2013-05-31 | Discharge: 2013-05-31 | Disposition: A | Discharge status: Home / Self Care | Payer: Medicaid Other | Attending: Emergency Medicine | Admitting: Emergency Medicine

## 2013-05-31 DIAGNOSIS — IMO0002 Reserved for concepts with insufficient information to code with codable children: Secondary | ICD-10-CM | POA: Insufficient documentation

## 2013-05-31 DIAGNOSIS — S5010XA Contusion of unspecified forearm, initial encounter: Secondary | ICD-10-CM | POA: Insufficient documentation

## 2013-05-31 DIAGNOSIS — M7989 Other specified soft tissue disorders: Secondary | ICD-10-CM | POA: Insufficient documentation

## 2013-05-31 DIAGNOSIS — W208XXA Other cause of strike by thrown, projected or falling object, initial encounter: Secondary | ICD-10-CM | POA: Insufficient documentation

## 2013-05-31 DIAGNOSIS — Y9389 Activity, other specified: Secondary | ICD-10-CM | POA: Insufficient documentation

## 2013-05-31 DIAGNOSIS — F909 Attention-deficit hyperactivity disorder, unspecified type: Secondary | ICD-10-CM | POA: Insufficient documentation

## 2013-05-31 DIAGNOSIS — Z880 Allergy status to penicillin: Secondary | ICD-10-CM | POA: Insufficient documentation

## 2013-05-31 DIAGNOSIS — S5012XA Contusion of left forearm, initial encounter: Secondary | ICD-10-CM

## 2013-05-31 DIAGNOSIS — Y929 Unspecified place or not applicable: Secondary | ICD-10-CM | POA: Insufficient documentation

## 2013-05-31 DIAGNOSIS — Z79899 Other long term (current) drug therapy: Secondary | ICD-10-CM | POA: Insufficient documentation

## 2013-05-31 MED ORDER — IBUPROFEN 600 MG PO TABS: 600.0000 mg | ORAL_TABLET | Freq: Four times a day (QID) | ORAL | Status: DC | PRN

## 2013-05-31 MED ORDER — KETOROLAC TROMETHAMINE 60 MG/2ML IM SOLN
60.0000 mg | Freq: Once | INTRAMUSCULAR | Status: AC
  Administered 2013-05-31: 60 mg via INTRAMUSCULAR
  Filled 2013-05-31: qty 2

## 2013-05-31 NOTE — ED Notes (Signed)
Father of said pt called desk asking for son to be evaluated by another dr. Upon arriving at room with DC papers father states wants to see another DR d/t being sent home with motrin. " motrin will not help my little boy with this kind of pain". EDP arrived at room explained the pt did not need a narcotic for his contusion, after father asking for Vicodin.  Father was upset, however signed dispo and told son , " well next time you'll have to cut your foot off to get better drugs". Family left with out incident.

## 2013-05-31 NOTE — ED Notes (Signed)
Pt was mowing with a push mower and slipped and motor fell on the patients arm. Strong radial pulse able to move all digits. Pt states he is unable to feel anything to left extremity.

## 2013-05-31 NOTE — ED Provider Notes (Signed)
CSN: [id number]     Arrival date & time 05/31/13  1314 History     First MD Initiated Contact with Patient 05/31/13 1334     Chief Complaint  Patient presents with  . Arm Injury   (Consider location/radiation/quality/duration/timing/severity/associated sxs/prior Treatment) Patient is a 22 y.o. male presenting with arm injury. The history is provided by the patient. No language interpreter was used.  Arm Injury Location:  Arm Time since incident:  1 hour Arm location:  L forearm Pain details:    Quality:  Aching   Radiates to:  Does not radiate   Severity:  Moderate   Onset quality:  Sudden   Duration:  1 hour   Timing:  Constant   Progression:  Unchanged Chronicity:  New Dislocation: no   Foreign body present:  No foreign bodies Tetanus status:  Unknown Prior injury to area:  No Relieved by:  Nothing Worsened by:  Movement Ineffective treatments:  None tried Associated symptoms: numbness and swelling   Associated symptoms: no back pain, no decreased range of motion, no fatigue and no fever   Risk factors: no frequent fractures and no recent illness     Past Medical History  Diagnosis Date  . ADHD (attention deficit hyperactivity disorder)   . Back pain   . Shoulder pain    History reviewed. No pertinent past surgical history. Family History  Problem Relation Age of Onset  . Stroke Father    History  Substance Use Topics  . Smoking status: Never Smoker   . Smokeless tobacco: Not on file  . Alcohol Use: No    Review of Systems  Constitutional: Negative for fever, activity change, appetite change and fatigue.  HENT: Negative for congestion, facial swelling, rhinorrhea and trouble swallowing.   Eyes: Negative for photophobia and pain.  Respiratory: Negative for cough, chest tightness and shortness of breath.   Cardiovascular: Negative for chest pain and leg swelling.  Gastrointestinal: Negative for nausea, vomiting, abdominal pain, diarrhea and constipation.    Endocrine: Negative for polydipsia and polyuria.  Genitourinary: Negative for dysuria, urgency, decreased urine volume and difficulty urinating.  Musculoskeletal: Negative for back pain and gait problem.  Skin: Negative for color change, rash and wound.  Allergic/Immunologic: Negative for immunocompromised state.  Neurological: Negative for dizziness, facial asymmetry, speech difficulty, weakness, numbness and headaches.  Psychiatric/Behavioral: Negative for confusion, decreased concentration and agitation.    Allergies  Codeine; Lactose intolerance (gi); Percocet; and Penicillins  Home Medications   Current Outpatient Rx  Name  Route  Sig  Dispense  Refill  . alprazolam (XANAX) 2 MG tablet   Oral   Take 2 mg by mouth 4 (four) times daily as needed. For anxiety         . cyclobenzaprine (FLEXERIL) 10 MG tablet   Oral   Take 1 tablet (10 mg total) by mouth 2 (two) times daily as needed for muscle spasms.   20 tablet   0   . fluticasone (FLONASE) 50 MCG/ACT nasal spray   Nasal   Place 2 sprays into the nose daily.           Marland Kitchen lithium carbonate 300 MG capsule   Oral   Take 300-600 mg by mouth 2 (two) times daily. Take one capsule (300mg ) every day in the morning, and take two capsules (600mg ) every day at bedtime         . propranolol (INNOPRAN XL) 80 MG 24 hr capsule   Oral   Take 80  mg by mouth daily.          . QUEtiapine (SEROQUEL) 300 MG tablet   Oral   Take 300 mg by mouth at bedtime.          Marland Kitchen ibuprofen (ADVIL,MOTRIN) 600 MG tablet   Oral   Take 1 tablet (600 mg total) by mouth every 6 (six) hours as needed for pain.   30 tablet   0    BP 107/63  Pulse 64  Temp(Src) 98 F (36.7 C) (Oral)  Resp 20  Ht 5\' 6"  (1.676 m)  Wt 140 lb (63.504 kg)  BMI 22.61 kg/m2  SpO2 100% Physical Exam  Constitutional: He is oriented to person, place, and time. He appears well-developed and well-nourished. No distress.  HENT:  Head: Normocephalic and atraumatic.   Mouth/Throat: No oropharyngeal exudate.  Eyes: Pupils are equal, round, and reactive to light.  Neck: Normal range of motion. Neck supple.  Cardiovascular: Normal rate, regular rhythm and normal heart sounds.  Exam reveals no gallop and no friction rub.   No murmur heard. Pulmonary/Chest: Effort normal and breath sounds normal. No respiratory distress. He has no wheezes. He has no rales.  Abdominal: Soft. Bowel sounds are normal. He exhibits no distension and no mass. There is no tenderness. There is no rebound and no guarding.  Musculoskeletal: Normal range of motion. He exhibits no edema.       Left forearm: He exhibits tenderness and swelling. He exhibits no deformity and no laceration.       Arms:      Left hand: He exhibits normal range of motion, no tenderness, no bony tenderness, normal capillary refill, no deformity, no laceration and no swelling.  Inconsistent report of dec sensation to hand, but after repeat testing of sensation in different order reports intact sensation to each finger.   Neurological: He is alert and oriented to person, place, and time.  Skin: Skin is warm and dry.  Psychiatric: He has a normal mood and affect.    ED Course   Procedures (including critical care time)  Labs Reviewed - No data to display Dg Forearm Left  05/31/2013   *RADIOLOGY REPORT*  Clinical Data: Injury with mid shaft pain.  LEFT FOREARM - 2 VIEW  Comparison: None.  Findings: No evidence of fracture, dislocation, soft tissue injury or radiopaque foreign object.  IMPRESSION: Negative radiographs   Original Report Authenticated By: Paulina Fusi, M.D.   1. Forearm contusion, left, initial encounter     MDM  Pt presents w/ mid forearm pain on palmar side after bar of puch mover fell on him while mowing grass on a slanted hill.  Pt reported numbness in hand, but has inconsistent exam with variable yes/no answer to decreased sensation, eventually answering to nml sensation at each finger.  Nml  motor, 2+ distal pulses.  No over reported injury.  XR forearm ordered and showed no acute trauma.  Of note, pt's father gave most of the history, seemed to be directing son.  I recommended NSAIDs, ice for pain.  Father asking for norco which i did not feel was appropriate.  After leaving room, father asked for new doctor.  I came back in to ask why, father says because he feels like he needs a few vicodin.  Return precautions given for new or worsening symptoms   1. Forearm contusion, left, initial encounter      Shanna Cisco, MD 05/31/13 1520

## 2013-06-13 ENCOUNTER — Encounter (HOSPITAL_COMMUNITY): Payer: Self-pay | Admitting: *Deleted

## 2013-06-13 ENCOUNTER — Emergency Department (HOSPITAL_COMMUNITY)
Admission: EM | Admit: 2013-06-13 | Discharge: 2013-06-13 | Disposition: A | Discharge status: Home / Self Care | Payer: Medicaid Other | Attending: Emergency Medicine | Admitting: Emergency Medicine

## 2013-06-13 ENCOUNTER — Emergency Department (HOSPITAL_COMMUNITY): Payer: Medicaid Other

## 2013-06-13 DIAGNOSIS — IMO0002 Reserved for concepts with insufficient information to code with codable children: Secondary | ICD-10-CM | POA: Insufficient documentation

## 2013-06-13 DIAGNOSIS — Z79899 Other long term (current) drug therapy: Secondary | ICD-10-CM | POA: Insufficient documentation

## 2013-06-13 DIAGNOSIS — Z880 Allergy status to penicillin: Secondary | ICD-10-CM | POA: Insufficient documentation

## 2013-06-13 DIAGNOSIS — K529 Noninfective gastroenteritis and colitis, unspecified: Secondary | ICD-10-CM

## 2013-06-13 DIAGNOSIS — R197 Diarrhea, unspecified: Secondary | ICD-10-CM | POA: Insufficient documentation

## 2013-06-13 DIAGNOSIS — K5289 Other specified noninfective gastroenteritis and colitis: Secondary | ICD-10-CM | POA: Insufficient documentation

## 2013-06-13 DIAGNOSIS — Z8659 Personal history of other mental and behavioral disorders: Secondary | ICD-10-CM | POA: Insufficient documentation

## 2013-06-13 LAB — CBC WITH DIFFERENTIAL/PLATELET
Eosinophils Absolute: 0.4 10*3/uL (ref 0.0–0.7)
Hemoglobin: 11.3 g/dL — ABNORMAL LOW (ref 13.0–17.0)
Lymphocytes Relative: 19 % (ref 12–46)
Lymphs Abs: 2.3 10*3/uL (ref 0.7–4.0)
MCH: 20.4 pg — ABNORMAL LOW (ref 26.0–34.0)
Neutro Abs: 8.4 10*3/uL — ABNORMAL HIGH (ref 1.7–7.7)
Neutrophils Relative %: 71 % (ref 43–77)
Platelets: 256 10*3/uL (ref 150–400)
RBC: 5.54 MIL/uL (ref 4.22–5.81)
WBC: 11.9 10*3/uL — ABNORMAL HIGH (ref 4.0–10.5)

## 2013-06-13 LAB — COMPREHENSIVE METABOLIC PANEL
ALT: 17 U/L (ref 0–53)
Alkaline Phosphatase: 86 U/L (ref 39–117)
Chloride: 96 mEq/L (ref 96–112)
GFR calc Af Amer: >90 mL/min (ref >90)
Glucose, Bld: 86 mg/dL (ref 70–99)
Potassium: 3.3 mEq/L — ABNORMAL LOW (ref 3.5–5.1)
Sodium: 136 mEq/L (ref 135–145)
Total Bilirubin: 1.9 mg/dL — ABNORMAL HIGH (ref 0.3–1.2)
Total Protein: 7.7 g/dL (ref 6.0–8.3)

## 2013-06-13 MED ORDER — RANITIDINE HCL 150 MG PO CAPS: 150.0000 mg | ORAL_CAPSULE | Freq: Every day | ORAL | Status: DC

## 2013-06-13 MED ORDER — KETOROLAC TROMETHAMINE 30 MG/ML IJ SOLN
30.0000 mg | Freq: Once | INTRAMUSCULAR | Status: AC
  Administered 2013-06-13: 30 mg via INTRAVENOUS
  Filled 2013-06-13: qty 1

## 2013-06-13 MED ORDER — SODIUM CHLORIDE 0.9 % IV BOLUS (SEPSIS)
1000.0000 mL | Freq: Once | INTRAVENOUS | Status: AC
  Administered 2013-06-13: 1000 mL via INTRAVENOUS

## 2013-06-13 NOTE — ED Provider Notes (Signed)
CSN: [id number]     Arrival date & time 06/13/13  1325 History   First MD Initiated Contact with Patient 06/13/13 1505     Chief Complaint  Patient presents with  . Abdominal Pain   (Consider location/radiation/quality/duration/timing/severity/associated sxs/prior Treatment) Patient is a 22 y.o. male presenting with abdominal pain. The history is provided by the patient (the pt complains of abd pain and diarhea for a few days).  Abdominal Pain Pain location:  Epigastric Pain quality: aching   Pain radiates to:  Does not radiate Pain severity:  Mild Onset quality:  Gradual Timing:  Intermittent Progression:  Waxing and waning Associated symptoms: diarrhea   Associated symptoms: no chest pain, no cough, no fatigue and no hematuria     Past Medical History  Diagnosis Date  . ADHD (attention deficit hyperactivity disorder)   . Back pain   . Shoulder pain    History reviewed. No pertinent past surgical history. Family History  Problem Relation Age of Onset  . Stroke Father    History  Substance Use Topics  . Smoking status: Never Smoker   . Smokeless tobacco: Not on file  . Alcohol Use: No    Review of Systems  Constitutional: Negative for appetite change and fatigue.  HENT: Negative for congestion, sinus pressure and ear discharge.   Eyes: Negative for discharge.  Respiratory: Negative for cough.   Cardiovascular: Negative for chest pain.  Gastrointestinal: Positive for abdominal pain and diarrhea.  Genitourinary: Negative for frequency and hematuria.  Musculoskeletal: Negative for back pain.  Skin: Negative for rash.  Neurological: Negative for seizures and headaches.  Psychiatric/Behavioral: Negative for hallucinations.    Allergies  Codeine; Lactose intolerance (gi); Percocet; and Penicillins  Home Medications   Current Outpatient Rx  Name  Route  Sig  Dispense  Refill  . alprazolam (XANAX) 2 MG tablet   Oral   Take 2 mg by mouth 4 (four) times daily as  needed. For anxiety         . cyclobenzaprine (FLEXERIL) 10 MG tablet   Oral   Take 1 tablet (10 mg total) by mouth 2 (two) times daily as needed for muscle spasms.   20 tablet   0   . fluticasone (FLONASE) 50 MCG/ACT nasal spray   Nasal   Place 2 sprays into the nose daily.           Marland Kitchen ibuprofen (ADVIL,MOTRIN) 600 MG tablet   Oral   Take 1 tablet (600 mg total) by mouth every 6 (six) hours as needed for pain.   30 tablet   0   . lithium carbonate 300 MG capsule   Oral   Take 300-600 mg by mouth 2 (two) times daily. Take one capsule (300mg ) every day in the morning, and take two capsules (600mg ) every day at bedtime         . propranolol (INNOPRAN XL) 80 MG 24 hr capsule   Oral   Take 80 mg by mouth daily.          . QUEtiapine (SEROQUEL) 300 MG tablet   Oral   Take 300 mg by mouth at bedtime.          . ranitidine (ZANTAC) 150 MG capsule   Oral   Take 1 capsule (150 mg total) by mouth daily.   15 capsule   0    BP 100/69  Pulse 79  Temp(Src) 97.5 F (36.4 C)  Resp 16  SpO2 95% Physical Exam  Constitutional:  He is oriented to person, place, and time. He appears well-developed.  HENT:  Head: Normocephalic.  Eyes: Conjunctivae and EOM are normal. No scleral icterus.  Neck: Neck supple. No thyromegaly present.  Cardiovascular: Normal rate and regular rhythm.  Exam reveals no gallop and no friction rub.   No murmur heard. Pulmonary/Chest: No stridor. He has no wheezes. He has no rales. He exhibits no tenderness.  Abdominal: He exhibits no distension. There is tenderness. There is no rebound.  Mild epigastric tenderness  Musculoskeletal: Normal range of motion. He exhibits no edema.  Lymphadenopathy:    He has no cervical adenopathy.  Neurological: He is oriented to person, place, and time. Coordination normal.  Skin: No rash noted. No erythema.  Psychiatric: He has a normal mood and affect. His behavior is normal.    ED Course  Procedures  (including critical care time) Labs Review Labs Reviewed  CBC WITH DIFFERENTIAL - Abnormal; Notable for the following:    WBC 11.9 (*)    Hemoglobin 11.3 (*)    HCT 34.7 (*)    MCV 62.6 (*)    MCH 20.4 (*)    Neutro Abs 8.4 (*)    All other components within normal limits  COMPREHENSIVE METABOLIC PANEL - Abnormal; Notable for the following:    Potassium 3.3 (*)    Total Bilirubin 1.9 (*)    All other components within normal limits   Imaging Review Dg Abd Acute W/chest  06/13/2013   CLINICAL DATA:  Abdominal pain.  EXAM: ACUTE ABDOMEN SERIES (ABDOMEN 2 VIEW & CHEST 1 VIEW)  COMPARISON:  CHEST x-ray 12/23/2012  FINDINGS: There is no evidence of dilated bowel loops or free intraperitoneal air. No radiopaque calculi or other significant radiographic abnormality is seen. Heart size and mediastinal contours are within normal limits. Both lungs are clear.  IMPRESSION: Negative abdominal radiographs.  No acute cardiopulmonary disease.   Electronically Signed   By: Charlett Nose   On: 06/13/2013 15:52    MDM   1. Gastroenteritis        Benny Lennert, MD 06/13/13 1731

## 2013-06-13 NOTE — ED Notes (Addendum)
Pt c/o abd pain with diarrhea for the past two days, has been using pepto bismol with no relief. Pt reports that mom has the same symptoms

## 2013-06-13 NOTE — ED Notes (Signed)
Pt LBM yesterday and states diarrhea, denies BM/diarrhea today, c/o nausea but denies vomiting, last drink/eat this morning, father at bedside states pt has had a bottle of Pepto bismol

## 2013-06-16 ENCOUNTER — Emergency Department (HOSPITAL_COMMUNITY)
Admission: EM | Admit: 2013-06-16 | Discharge: 2013-06-17 | Disposition: A | Discharge status: Home / Self Care | Payer: MEDICAID | Attending: Emergency Medicine | Admitting: Emergency Medicine

## 2013-06-16 ENCOUNTER — Encounter (HOSPITAL_COMMUNITY): Payer: Self-pay | Admitting: Emergency Medicine

## 2013-06-16 DIAGNOSIS — M545 Low back pain, unspecified: Secondary | ICD-10-CM | POA: Insufficient documentation

## 2013-06-16 DIAGNOSIS — Z880 Allergy status to penicillin: Secondary | ICD-10-CM | POA: Insufficient documentation

## 2013-06-16 DIAGNOSIS — Z79899 Other long term (current) drug therapy: Secondary | ICD-10-CM | POA: Insufficient documentation

## 2013-06-16 DIAGNOSIS — Z8659 Personal history of other mental and behavioral disorders: Secondary | ICD-10-CM | POA: Insufficient documentation

## 2013-06-16 DIAGNOSIS — F410 Panic disorder [episodic paroxysmal anxiety] without agoraphobia: Secondary | ICD-10-CM

## 2013-06-16 DIAGNOSIS — IMO0002 Reserved for concepts with insufficient information to code with codable children: Secondary | ICD-10-CM | POA: Insufficient documentation

## 2013-06-16 MED ORDER — LORAZEPAM 1 MG PO TABS
1.0000 mg | ORAL_TABLET | Freq: Once | ORAL | Status: AC
  Administered 2013-06-17: 1 mg via ORAL
  Filled 2013-06-16: qty 1
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
## 2013-06-17 MED ORDER — HYDROXYZINE HCL 25 MG PO TABS: 50.0000 mg | ORAL_TABLET | Freq: Every evening | ORAL | Status: DC | PRN

## 2013-06-25 ENCOUNTER — Encounter (HOSPITAL_COMMUNITY): Payer: Self-pay | Admitting: Emergency Medicine

## 2013-06-25 ENCOUNTER — Emergency Department (HOSPITAL_COMMUNITY)
Admission: EM | Admit: 2013-06-25 | Discharge: 2013-06-25 | Disposition: A | Discharge status: Home / Self Care | Payer: Medicaid Other | Attending: Emergency Medicine | Admitting: Emergency Medicine

## 2013-06-25 DIAGNOSIS — K044 Acute apical periodontitis of pulpal origin: Secondary | ICD-10-CM | POA: Insufficient documentation

## 2013-06-25 DIAGNOSIS — Z79899 Other long term (current) drug therapy: Secondary | ICD-10-CM | POA: Insufficient documentation

## 2013-06-25 DIAGNOSIS — IMO0002 Reserved for concepts with insufficient information to code with codable children: Secondary | ICD-10-CM | POA: Insufficient documentation

## 2013-06-25 DIAGNOSIS — Z880 Allergy status to penicillin: Secondary | ICD-10-CM | POA: Insufficient documentation

## 2013-06-25 DIAGNOSIS — K047 Periapical abscess without sinus: Secondary | ICD-10-CM

## 2013-06-25 DIAGNOSIS — K002 Abnormalities of size and form of teeth: Secondary | ICD-10-CM | POA: Insufficient documentation

## 2013-06-25 DIAGNOSIS — Z8659 Personal history of other mental and behavioral disorders: Secondary | ICD-10-CM | POA: Insufficient documentation

## 2013-06-25 MED ORDER — CLINDAMYCIN HCL 150 MG PO CAPS: 150.0000 mg | ORAL_CAPSULE | Freq: Four times a day (QID) | ORAL | Status: DC

## 2013-06-25 MED ORDER — TRAMADOL HCL 50 MG PO TABS: 50.0000 mg | ORAL_TABLET | Freq: Four times a day (QID) | ORAL | Status: DC | PRN

## 2013-06-25 MED ORDER — TRAMADOL HCL 50 MG PO TABS
50.0000 mg | ORAL_TABLET | Freq: Once | ORAL | Status: AC
  Administered 2013-06-25: 50 mg via ORAL
  Filled 2013-06-25: qty 1

## 2013-06-25 MED ORDER — CLINDAMYCIN HCL 150 MG PO CAPS
150.0000 mg | ORAL_CAPSULE | Freq: Once | ORAL | Status: AC
  Administered 2013-06-25: 150 mg via ORAL
  Filled 2013-06-25: qty 1

## 2013-06-25 NOTE — ED Provider Notes (Signed)
CSN: [id number]     Arrival date & time 06/25/13  1301 History   First MD Initiated Contact with Patient 06/25/13 1343     Chief Complaint  Patient presents with  . Dental Pain   (Consider location/radiation/quality/duration/timing/severity/associated sxs/prior Treatment) HPI Comments: Eugene Hicks is a 22 y.o. Male presenting with acute on chronic dental pain and generalized gingival swelling.   He denies new injury, his entire dentition is warn away to the gingival line and per father, is to have his teeth extracted later this month, has appt scheduled with Dr. Retta Hicks.  There has been no fevers,  Chills, nausea or vomiting, also no complaint of difficulty swallowing,  Although chewing makes pain worse.  The patient has tried tylenol without relief of symptoms.         The history is provided by the patient and a parent.    Past Medical History  Diagnosis Date  . ADHD (attention deficit hyperactivity disorder)   . Back pain   . Shoulder pain    History reviewed. No pertinent past surgical history. Family History  Problem Relation Age of Onset  . Stroke Father   . Seizures Mother   . Seizures Brother    History  Substance Use Topics  . Smoking status: Never Smoker   . Smokeless tobacco: Never Used  . Alcohol Use: No    Review of Systems  Constitutional: Negative for fever.  HENT: Positive for dental problem. Negative for sore throat, facial swelling, neck pain and neck stiffness.   Respiratory: Negative for shortness of breath.     Allergies  Codeine; Lactose intolerance (gi); Percocet; and Penicillins  Home Medications   Current Outpatient Rx  Name  Route  Sig  Dispense  Refill  . alprazolam (XANAX) 2 MG tablet   Oral   Take 2 mg by mouth 4 (four) times daily as needed. For anxiety         . clindamycin (CLEOCIN) 150 MG capsule   Oral   Take 1 capsule (150 mg total) by mouth every 6 (six) hours.   28 capsule   0   . cyclobenzaprine (FLEXERIL) 10 MG  tablet   Oral   Take 1 tablet (10 mg total) by mouth 2 (two) times daily as needed for muscle spasms.   20 tablet   0   . fluticasone (FLONASE) 50 MCG/ACT nasal spray   Nasal   Place 2 sprays into the nose daily.           . hydrOXYzine (ATARAX/VISTARIL) 25 MG tablet   Oral   Take 2 tablets (50 mg total) by mouth at bedtime as needed for itching.   12 tablet   0   . ibuprofen (ADVIL,MOTRIN) 600 MG tablet   Oral   Take 1 tablet (600 mg total) by mouth every 6 (six) hours as needed for pain.   30 tablet   0   . lithium carbonate 300 MG capsule   Oral   Take 300-600 mg by mouth 2 (two) times daily. Take one capsule (300mg ) every day in the morning, and take two capsules (600mg ) every day at bedtime         . propranolol (INNOPRAN XL) 80 MG 24 hr capsule   Oral   Take 80 mg by mouth daily.          . QUEtiapine (SEROQUEL) 300 MG tablet   Oral   Take 300 mg by mouth at bedtime.          Marland Kitchen  ranitidine (ZANTAC) 150 MG capsule   Oral   Take 1 capsule (150 mg total) by mouth daily.   15 capsule   0   . traMADol (ULTRAM) 50 MG tablet   Oral   Take 1 tablet (50 mg total) by mouth every 6 (six) hours as needed for pain.   15 tablet   0    BP 107/60  Pulse 65  Temp(Src) 97.4 F (36.3 C) (Oral)  Resp 18  Ht 5\' 6"  (1.676 m)  Wt 138 lb (62.596 kg)  BMI 22.28 kg/m2  SpO2 100% Physical Exam  Constitutional: He is oriented to person, place, and time. He appears well-developed and well-nourished. No distress.  HENT:  Head: Normocephalic and atraumatic.  Right Ear: Tympanic membrane and external ear normal.  Left Ear: Tympanic membrane and external ear normal.  Mouth/Throat: Oropharynx is clear and moist and mucous membranes are normal. No oral lesions. No trismus in the jaw. Abnormal dentition.  Generalized poor dentition, patients teeth are all worn down nearly to the gingival line,  Nearly all teeth have exposed dentin.  Has particularly edematous gingiva right  lower lateral gingiva, no fluctuance or active drainage.  Eyes: Conjunctivae are normal.  Neck: Normal range of motion. Neck supple.  Cardiovascular: Normal rate and normal heart sounds.   Pulmonary/Chest: Effort normal.  Abdominal: He exhibits no distension.  Musculoskeletal: Normal range of motion.  Lymphadenopathy:       Head (right side): Submandibular adenopathy present.    He has no cervical adenopathy.  Neurological: He is alert and oriented to person, place, and time.  Skin: Skin is warm and dry. No erythema.  Psychiatric: He has a normal mood and affect.    ED Course  Procedures (including critical care time) Labs Review Labs Reviewed - No data to display Imaging Review No results found.  MDM   1. Dental infection    Pt was prescribed clindamycin, first dose given here.  Tramadol prescribed.  Encouraged f/u with Dr. Retta Hicks as planned.    Burgess Amor, PA-C 06/25/13 2027

## 2013-06-25 NOTE — ED Notes (Signed)
Patient c/o dental pain, per father patient is scheduled to have oral surgery to remove all of his teeth "in middle of this month." Father reports giving patient tylenol with no relief.

## 2013-06-27 NOTE — ED Provider Notes (Signed)
Medical screening examination/treatment/procedure(s) were performed by non-physician practitioner and as supervising physician I was immediately available for consultation/collaboration.  Donnetta Hutching, MD 06/27/13 [phone]

## 2013-06-29 ENCOUNTER — Emergency Department (HOSPITAL_COMMUNITY)
Admission: EM | Admit: 2013-06-29 | Discharge: 2013-06-29 | Disposition: A | Discharge status: Home / Self Care | Payer: Medicaid Other | Attending: Emergency Medicine | Admitting: Emergency Medicine

## 2013-06-29 ENCOUNTER — Encounter (HOSPITAL_COMMUNITY): Payer: Self-pay | Admitting: *Deleted

## 2013-06-29 DIAGNOSIS — IMO0002 Reserved for concepts with insufficient information to code with codable children: Secondary | ICD-10-CM | POA: Insufficient documentation

## 2013-06-29 DIAGNOSIS — Z8739 Personal history of other diseases of the musculoskeletal system and connective tissue: Secondary | ICD-10-CM | POA: Insufficient documentation

## 2013-06-29 DIAGNOSIS — Z79899 Other long term (current) drug therapy: Secondary | ICD-10-CM | POA: Insufficient documentation

## 2013-06-29 DIAGNOSIS — Z880 Allergy status to penicillin: Secondary | ICD-10-CM | POA: Insufficient documentation

## 2013-06-29 DIAGNOSIS — Z8659 Personal history of other mental and behavioral disorders: Secondary | ICD-10-CM | POA: Insufficient documentation

## 2013-06-29 DIAGNOSIS — Z77098 Contact with and (suspected) exposure to other hazardous, chiefly nonmedicinal, chemicals: Secondary | ICD-10-CM | POA: Insufficient documentation

## 2013-06-29 NOTE — ED Notes (Signed)
Patient drank Sprite w/out nausea or vomiting.
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
## 2013-07-05 ENCOUNTER — Encounter (HOSPITAL_COMMUNITY): Payer: Self-pay

## 2013-07-05 ENCOUNTER — Emergency Department (HOSPITAL_COMMUNITY)
Admission: EM | Admit: 2013-07-05 | Discharge: 2013-07-05 | Disposition: A | Discharge status: Home / Self Care | Payer: Medicaid Other | Attending: Emergency Medicine | Admitting: Emergency Medicine

## 2013-07-05 DIAGNOSIS — Z79899 Other long term (current) drug therapy: Secondary | ICD-10-CM | POA: Insufficient documentation

## 2013-07-05 DIAGNOSIS — K002 Abnormalities of size and form of teeth: Secondary | ICD-10-CM | POA: Insufficient documentation

## 2013-07-05 DIAGNOSIS — Z880 Allergy status to penicillin: Secondary | ICD-10-CM | POA: Insufficient documentation

## 2013-07-05 DIAGNOSIS — Z8739 Personal history of other diseases of the musculoskeletal system and connective tissue: Secondary | ICD-10-CM | POA: Insufficient documentation

## 2013-07-05 DIAGNOSIS — R599 Enlarged lymph nodes, unspecified: Secondary | ICD-10-CM | POA: Insufficient documentation

## 2013-07-05 DIAGNOSIS — Z8659 Personal history of other mental and behavioral disorders: Secondary | ICD-10-CM | POA: Insufficient documentation

## 2013-07-05 DIAGNOSIS — K047 Periapical abscess without sinus: Secondary | ICD-10-CM | POA: Insufficient documentation

## 2013-07-05 DIAGNOSIS — IMO0002 Reserved for concepts with insufficient information to code with codable children: Secondary | ICD-10-CM | POA: Insufficient documentation

## 2013-07-05 MED ORDER — IBUPROFEN 400 MG PO TABS
400.0000 mg | ORAL_TABLET | Freq: Once | ORAL | Status: AC
  Administered 2013-07-05: 400 mg via ORAL
  Filled 2013-07-05: qty 1

## 2013-07-05 MED ORDER — TRAMADOL HCL 50 MG PO TABS
50.0000 mg | ORAL_TABLET | Freq: Once | ORAL | Status: AC
  Administered 2013-07-05: 50 mg via ORAL
  Filled 2013-07-05: qty 1

## 2013-07-05 MED ORDER — TRAMADOL HCL 50 MG PO TABS: 50.0000 mg | ORAL_TABLET | Freq: Four times a day (QID) | ORAL | Status: DC | PRN

## 2013-07-05 MED ORDER — CLINDAMYCIN HCL 150 MG PO CAPS
150.0000 mg | ORAL_CAPSULE | Freq: Once | ORAL | Status: AC
  Administered 2013-07-05: 150 mg via ORAL
  Filled 2013-07-05: qty 1

## 2013-07-05 MED ORDER — IBUPROFEN 600 MG PO TABS: 600.0000 mg | ORAL_TABLET | Freq: Four times a day (QID) | ORAL | Status: DC | PRN

## 2013-07-05 MED ORDER — CLINDAMYCIN HCL 150 MG PO CAPS: 150.0000 mg | ORAL_CAPSULE | Freq: Four times a day (QID) | ORAL | Status: DC

## 2013-07-05 NOTE — ED Provider Notes (Signed)
CSN: [id number]     Arrival date & time 07/05/13  1743 History   First MD Initiated Contact with Patient 07/05/13 1755     Chief Complaint  Patient presents with  . Dental Pain   (Consider location/radiation/quality/duration/timing/severity/associated sxs/prior Treatment) HPI Comments: Eugene Hicks is a 22 y.o. Male presenting with a several month history of worsened dental pain and gingival swelling.   The patient has a history of generalized dental decay and dental fractures and is anticipation of extractions starting next month by Dr. Retta Mac in anticipation of getting dentures.  He was seen here for the same complaint last week at which time he was prescribed clindamycin, unfortunately this prescription was left in a relatives car, was never filled and the patient does not have access to getting the original prescription to get filled.  There has been no fevers,  Chills, nausea or vomiting, also no complaint of difficulty swallowing,  Although chewing makes pain worse.  The patient has tried ibuprofen without relief of symptoms.         The history is provided by the patient.    Past Medical History  Diagnosis Date  . ADHD (attention deficit hyperactivity disorder)   . Back pain   . Shoulder pain    History reviewed. No pertinent past surgical history. Family History  Problem Relation Age of Onset  . Stroke Father   . Seizures Mother   . Seizures Brother    History  Substance Use Topics  . Smoking status: Never Smoker   . Smokeless tobacco: Never Used  . Alcohol Use: No    Review of Systems  Constitutional: Negative for fever.  HENT: Positive for dental problem. Negative for sore throat, facial swelling, neck pain and neck stiffness.   Respiratory: Negative for shortness of breath.     Allergies  Codeine; Lactose intolerance (gi); Percocet; and Penicillins  Home Medications   Current Outpatient Rx  Name  Route  Sig  Dispense  Refill  . alprazolam (XANAX) 2 MG  tablet   Oral   Take 2 mg by mouth 4 (four) times daily as needed. For anxiety         . clindamycin (CLEOCIN) 150 MG capsule   Oral   Take 1 capsule (150 mg total) by mouth every 6 (six) hours.   28 capsule   0   . clindamycin (CLEOCIN) 150 MG capsule   Oral   Take 1 capsule (150 mg total) by mouth every 6 (six) hours.   28 capsule   0   . cyclobenzaprine (FLEXERIL) 10 MG tablet   Oral   Take 1 tablet (10 mg total) by mouth 2 (two) times daily as needed for muscle spasms.   20 tablet   0   . fluticasone (FLONASE) 50 MCG/ACT nasal spray   Nasal   Place 2 sprays into the nose daily.           . hydrOXYzine (ATARAX/VISTARIL) 25 MG tablet   Oral   Take 2 tablets (50 mg total) by mouth at bedtime as needed for itching.   12 tablet   0   . ibuprofen (ADVIL,MOTRIN) 600 MG tablet   Oral   Take 1 tablet (600 mg total) by mouth every 6 (six) hours as needed for pain.   30 tablet   0   . ibuprofen (ADVIL,MOTRIN) 600 MG tablet   Oral   Take 1 tablet (600 mg total) by mouth every 6 (six) hours as needed  for pain.   20 tablet   0   . lithium carbonate 300 MG capsule   Oral   Take 300-600 mg by mouth 2 (two) times daily. Take one capsule (300mg ) every day in the morning, and take two capsules (600mg ) every day at bedtime         . propranolol (INNOPRAN XL) 80 MG 24 hr capsule   Oral   Take 80 mg by mouth daily.          . QUEtiapine (SEROQUEL) 300 MG tablet   Oral   Take 300 mg by mouth at bedtime.          . ranitidine (ZANTAC) 150 MG capsule   Oral   Take 1 capsule (150 mg total) by mouth daily.   15 capsule   0   . traMADol (ULTRAM) 50 MG tablet   Oral   Take 1 tablet (50 mg total) by mouth every 6 (six) hours as needed for pain.   15 tablet   0    BP 115/57  Pulse 73  Temp(Src) 98.3 F (36.8 C) (Oral)  Resp 20  Ht 5\' 6"  (1.676 m)  Wt 125 lb (56.7 kg)  BMI 20.19 kg/m2  SpO2 100% Physical Exam  Constitutional: He is oriented to person,  place, and time. He appears well-developed and well-nourished. No distress.  HENT:  Head: Normocephalic and atraumatic.  Right Ear: Tympanic membrane and external ear normal.  Left Ear: Tympanic membrane and external ear normal.  Mouth/Throat: Oropharynx is clear and moist and mucous membranes are normal. No oral lesions. No trismus in the jaw. Abnormal dentition.  Generalized poor dentition, patients teeth are all worn down nearly to the gingival line,  Nearly all teeth have exposed dentin.  Has particularly edematous gingiva right lower lateral gingiva, no fluctuance or active drainage.  Eyes: Conjunctivae are normal.  Neck: Normal range of motion. Neck supple.  Cardiovascular: Normal rate and normal heart sounds.   Pulmonary/Chest: Effort normal.  Abdominal: He exhibits no distension.  Musculoskeletal: Normal range of motion.  Lymphadenopathy:       Head (right side): Submandibular adenopathy present.    He has no cervical adenopathy.  Neurological: He is alert and oriented to person, place, and time.  Skin: Skin is warm and dry. No erythema.  Psychiatric: He has a normal mood and affect.    ED Course  Procedures (including critical care time) Labs Review Labs Reviewed - No data to display Imaging Review No results found.  MDM   1. Dental abscess    Reissued prescriptions for clindamycin, tramadol,  Also added ibuprofen.  Pt advised to f/u with Dr Retta Mac as planned,  Sooner for worsened sx.    Burgess Amor, PA-C 07/05/13 1821

## 2013-07-05 NOTE — ED Notes (Signed)
Pt and father report dental pain for several months.

## 2013-07-06 NOTE — ED Provider Notes (Signed)
Medical screening examination/treatment/procedure(s) were performed by non-physician practitioner and as supervising physician I was immediately available for consultation/collaboration.   Audree Camel, MD 07/06/13 [phone]

## 2013-07-21 ENCOUNTER — Encounter (HOSPITAL_COMMUNITY): Payer: Self-pay | Admitting: Emergency Medicine

## 2013-07-21 ENCOUNTER — Emergency Department (HOSPITAL_COMMUNITY)
Admission: EM | Admit: 2013-07-21 | Discharge: 2013-07-21 | Disposition: A | Discharge status: Home / Self Care | Payer: Medicaid Other | Attending: Emergency Medicine | Admitting: Emergency Medicine

## 2013-07-21 DIAGNOSIS — K089 Disorder of teeth and supporting structures, unspecified: Secondary | ICD-10-CM | POA: Insufficient documentation

## 2013-07-21 DIAGNOSIS — Z79899 Other long term (current) drug therapy: Secondary | ICD-10-CM | POA: Insufficient documentation

## 2013-07-21 DIAGNOSIS — Z880 Allergy status to penicillin: Secondary | ICD-10-CM | POA: Insufficient documentation

## 2013-07-21 DIAGNOSIS — IMO0002 Reserved for concepts with insufficient information to code with codable children: Secondary | ICD-10-CM | POA: Insufficient documentation

## 2013-07-21 DIAGNOSIS — Z791 Long term (current) use of non-steroidal anti-inflammatories (NSAID): Secondary | ICD-10-CM | POA: Insufficient documentation

## 2013-07-21 DIAGNOSIS — G8929 Other chronic pain: Secondary | ICD-10-CM | POA: Insufficient documentation

## 2013-07-21 DIAGNOSIS — Z8659 Personal history of other mental and behavioral disorders: Secondary | ICD-10-CM | POA: Insufficient documentation

## 2013-07-21 DIAGNOSIS — K029 Dental caries, unspecified: Secondary | ICD-10-CM | POA: Insufficient documentation

## 2013-07-21 MED ORDER — TRAMADOL HCL 50 MG PO TABS: 50.0000 mg | ORAL_TABLET | Freq: Four times a day (QID) | ORAL | Status: DC | PRN

## 2013-07-21 MED ORDER — HYDROCODONE-ACETAMINOPHEN 5-325 MG PO TABS
1.0000 | ORAL_TABLET | Freq: Once | ORAL | Status: AC
  Administered 2013-07-21: 1 via ORAL
  Filled 2013-07-21: qty 1

## 2013-07-21 MED ORDER — NAPROXEN 500 MG PO TABS: 500.0000 mg | ORAL_TABLET | Freq: Two times a day (BID) | ORAL | Status: DC

## 2013-07-21 NOTE — ED Notes (Signed)
Pt c/o front lower jaw dental pain, onset this afternoon.

## 2013-07-26 ENCOUNTER — Encounter (HOSPITAL_COMMUNITY): Payer: Self-pay | Admitting: Emergency Medicine

## 2013-07-26 ENCOUNTER — Emergency Department (HOSPITAL_COMMUNITY)
Admission: EM | Admit: 2013-07-26 | Discharge: 2013-07-26 | Disposition: A | Discharge status: Home / Self Care | Payer: Medicaid Other | Attending: Emergency Medicine | Admitting: Emergency Medicine

## 2013-07-26 ENCOUNTER — Emergency Department (HOSPITAL_COMMUNITY)
Admission: EM | Admit: 2013-07-26 | Discharge: 2013-07-26 | Disposition: A | Discharge status: Home / Self Care | Payer: Medicaid Other | Source: Home / Self Care | Attending: Emergency Medicine | Admitting: Emergency Medicine

## 2013-07-26 DIAGNOSIS — Z8659 Personal history of other mental and behavioral disorders: Secondary | ICD-10-CM | POA: Insufficient documentation

## 2013-07-26 DIAGNOSIS — Y929 Unspecified place or not applicable: Secondary | ICD-10-CM | POA: Insufficient documentation

## 2013-07-26 DIAGNOSIS — K029 Dental caries, unspecified: Secondary | ICD-10-CM | POA: Insufficient documentation

## 2013-07-26 DIAGNOSIS — Y9389 Activity, other specified: Secondary | ICD-10-CM | POA: Insufficient documentation

## 2013-07-26 DIAGNOSIS — Z880 Allergy status to penicillin: Secondary | ICD-10-CM | POA: Insufficient documentation

## 2013-07-26 DIAGNOSIS — S00419A Abrasion of unspecified ear, initial encounter: Secondary | ICD-10-CM

## 2013-07-26 DIAGNOSIS — IMO0002 Reserved for concepts with insufficient information to code with codable children: Secondary | ICD-10-CM | POA: Insufficient documentation

## 2013-07-26 DIAGNOSIS — M549 Dorsalgia, unspecified: Secondary | ICD-10-CM | POA: Insufficient documentation

## 2013-07-26 DIAGNOSIS — T63461A Toxic effect of venom of wasps, accidental (unintentional), initial encounter: Secondary | ICD-10-CM | POA: Insufficient documentation

## 2013-07-26 DIAGNOSIS — Y939 Activity, unspecified: Secondary | ICD-10-CM | POA: Insufficient documentation

## 2013-07-26 DIAGNOSIS — Z79899 Other long term (current) drug therapy: Secondary | ICD-10-CM | POA: Insufficient documentation

## 2013-07-26 DIAGNOSIS — T6391XA Toxic effect of contact with unspecified venomous animal, accidental (unintentional), initial encounter: Secondary | ICD-10-CM | POA: Insufficient documentation

## 2013-07-26 MED ORDER — SODIUM CHLORIDE 0.9 % IV SOLN
Freq: Once | INTRAVENOUS | Status: AC
  Administered 2013-07-26: 20:00:00 via INTRAVENOUS

## 2013-07-26 MED ORDER — DIPHENHYDRAMINE HCL 50 MG/ML IJ SOLN
25.0000 mg | Freq: Once | INTRAMUSCULAR | Status: AC
  Administered 2013-07-26: 25 mg via INTRAVENOUS
  Filled 2013-07-26: qty 1

## 2013-07-26 MED ORDER — NEOMYCIN-POLYMYXIN-HC 1 % OT SOLN
3.0000 [drp] | Freq: Once | OTIC | Status: AC
  Administered 2013-07-26: 3 [drp] via OTIC

## 2013-07-26 MED ORDER — ANTIPYRINE-BENZOCAINE 5.4-1.4 % OT SOLN
3.0000 [drp] | OTIC | Status: DC | PRN
  Administered 2013-07-26: 4 [drp] via OTIC
  Filled 2013-07-26: qty 10

## 2013-07-26 MED ORDER — NEOMYCIN-POLYMYXIN-HC 3.5-10000-1 OT SUSP
OTIC | Status: AC
  Filled 2013-07-26: qty 10

## 2013-07-26 MED ORDER — EPINEPHRINE 0.3 MG/0.3ML IJ SOAJ: 0.3000 mg | Freq: Once | INTRAMUSCULAR | Status: AC

## 2013-07-26 MED ORDER — HYDROCODONE-ACETAMINOPHEN 5-325 MG PO TABS: 1.0000 | ORAL_TABLET | ORAL | Status: DC | PRN

## 2013-07-26 MED ORDER — FAMOTIDINE IN NACL 20-0.9 MG/50ML-% IV SOLN
20.0000 mg | Freq: Once | INTRAVENOUS | Status: AC
  Administered 2013-07-26: 20 mg via INTRAVENOUS
  Filled 2013-07-26: qty 50

## 2013-07-26 MED ORDER — HYDROCODONE-ACETAMINOPHEN 5-325 MG PO TABS
1.0000 | ORAL_TABLET | Freq: Once | ORAL | Status: AC
  Administered 2013-07-26: 1 via ORAL
  Filled 2013-07-26: qty 1

## 2013-07-26 MED ORDER — METHYLPREDNISOLONE SODIUM SUCC 125 MG IJ SOLR
125.0000 mg | Freq: Once | INTRAMUSCULAR | Status: AC
  Administered 2013-07-26: 125 mg via INTRAVENOUS
  Filled 2013-07-26: qty 2

## 2013-07-26 NOTE — ED Provider Notes (Signed)
CSN: [id number]     Arrival date & time 07/21/13  1815 History   First MD Initiated Contact with Patient 07/21/13 1821     Chief Complaint  Patient presents with  . Dental Pain   (Consider location/radiation/quality/duration/timing/severity/associated sxs/prior Treatment) HPI Comments: Eugene Hicks is a 22 y.o. Male presents with a 1 day history of right lower jaw pain which started this afternoon.   The patient has a history of chronic dental pain and severe dental decay of most of his teeth.  He is scheduled to have dental extractions later this month, although he was scheduled for this last month but for unclear reasons he had to reschedule with Dr. Retta Mac.  There has been no fevers,  Chills, nausea or vomiting, also no complaint of difficulty swallowing,  Although chewing makes pain worse.  The patient has tried tylenol without relief of symptoms.         The history is provided by the patient and a parent.    Past Medical History  Diagnosis Date  . ADHD (attention deficit hyperactivity disorder)   . Back pain   . Shoulder pain    History reviewed. No pertinent past surgical history. Family History  Problem Relation Age of Onset  . Stroke Father   . Seizures Mother   . Seizures Brother    History  Substance Use Topics  . Smoking status: Never Smoker   . Smokeless tobacco: Never Used  . Alcohol Use: No    Review of Systems  Constitutional: Negative for fever.  HENT: Positive for dental problem. Negative for facial swelling and sore throat.   Respiratory: Negative for shortness of breath.   Musculoskeletal: Negative for neck pain and neck stiffness.    Allergies  Codeine; Lactose intolerance (gi); Percocet; and Penicillins  Home Medications   Current Outpatient Rx  Name  Route  Sig  Dispense  Refill  . alprazolam (XANAX) 2 MG tablet   Oral   Take 2 mg by mouth 4 (four) times daily as needed. For anxiety         . fluticasone (FLONASE) 50 MCG/ACT nasal  spray   Nasal   Place 2 sprays into the nose daily.           . hydrOXYzine (ATARAX/VISTARIL) 25 MG tablet   Oral   Take 2 tablets (50 mg total) by mouth at bedtime as needed for itching.   12 tablet   0   . ibuprofen (ADVIL,MOTRIN) 600 MG tablet   Oral   Take 1 tablet (600 mg total) by mouth every 6 (six) hours as needed for pain.   20 tablet   0   . lithium carbonate 300 MG capsule   Oral   Take 300-600 mg by mouth 2 (two) times daily. Take one capsule (300mg ) every day in the morning, and take two capsules (600mg ) every day at bedtime         . propranolol (INNOPRAN XL) 80 MG 24 hr capsule   Oral   Take 80 mg by mouth daily.          . QUEtiapine (SEROQUEL) 300 MG tablet   Oral   Take 300 mg by mouth at bedtime.          . ranitidine (ZANTAC) 150 MG capsule   Oral   Take 1 capsule (150 mg total) by mouth daily.   15 capsule   0   . HYDROcodone-acetaminophen (NORCO/VICODIN) 5-325 MG per tablet   Oral  Take 1 tablet by mouth every 4 (four) hours as needed for pain.   10 tablet   0   . naproxen (NAPROSYN) 500 MG tablet   Oral   Take 1 tablet (500 mg total) by mouth 2 (two) times daily.   30 tablet   0   . traMADol (ULTRAM) 50 MG tablet   Oral   Take 1 tablet (50 mg total) by mouth every 6 (six) hours as needed for pain.   15 tablet   0    BP 94/38  Pulse 46  Temp(Src) 97.9 F (36.6 C) (Oral)  Resp 18  Ht 5\' 6"  (1.676 m)  Wt 138 lb (62.596 kg)  BMI 22.28 kg/m2  SpO2 100% Physical Exam  Constitutional: He is oriented to person, place, and time. He appears well-developed and well-nourished. No distress.  HENT:  Head: Normocephalic and atraumatic.  Right Ear: Tympanic membrane and external ear normal.  Left Ear: Tympanic membrane and external ear normal.  Mouth/Throat: Oropharynx is clear and moist and mucous membranes are normal. No oral lesions. No trismus in the jaw. Abnormal dentition. No dental abscesses.  Generalized poor dentition, no  gingival edema.  Teeth are worn/decayed away to the gingival.  No abscess or edema appreciated.  Eyes: Conjunctivae are normal.  Neck: Normal range of motion. Neck supple.  Cardiovascular: Normal rate and normal heart sounds.   Pulmonary/Chest: Effort normal.  Abdominal: He exhibits no distension.  Musculoskeletal: Normal range of motion.  Lymphadenopathy:    He has no cervical adenopathy.  Neurological: He is alert and oriented to person, place, and time.  Skin: Skin is warm and dry. No erythema.  Psychiatric: He has a normal mood and affect.    ED Course  Procedures (including critical care time) Labs Review Labs Reviewed - No data to display Imaging Review No results found.  MDM   1. Chronic dental pain    Pt has just completed a course of clindamycin within the past week,  No sign of abscess today.  He was prescribed naproxen and tramadol.  Encouraged f/u with Dr. Retta Mac as planned.  The patient appears reasonably screened and/or stabilized for discharge and I doubt any other medical condition or other [facility] requiring further screening, evaluation, or treatment in the ED at this time prior to discharge.     Burgess Amor, PA-C 07/26/13 2000

## 2013-07-26 NOTE — ED Notes (Signed)
Pt presents via EMS secondary to 4 bee stings on left hand, minimal swelling noted at base of fingers and top of hand. Also has 1 sting at the elbow.  No edema or swelling noted at that site. Pt states is SOB, No distress or increased WOB noted at this time. BBS clear and equal. No rash noted. Ice pack applied.

## 2013-07-26 NOTE — ED Notes (Signed)
Pt received from EMS on left hand x 3. No swelling, SOB noted. BBS clear and equal.

## 2013-07-26 NOTE — ED Provider Notes (Signed)
Medical screening examination/treatment/procedure(s) were performed by non-physician practitioner and as supervising physician I was immediately available for consultation/collaboration.   Koa Palla L Georgi Tuel, MD 07/26/13 1529 

## 2013-07-26 NOTE — ED Provider Notes (Signed)
CSN: [id number]     Arrival date & time 07/26/13  1319 History   First MD Initiated Contact with Patient 07/26/13 1332     Chief Complaint  Patient presents with  . Otalgia  . Ear Injury   (Consider location/radiation/quality/duration/timing/severity/associated sxs/prior Treatment) Patient is a 22 y.o. male presenting with ear pain. The history is provided by the patient.  Otalgia Location:  Right Behind ear:  No abnormality Quality:  Throbbing Severity:  Moderate Onset quality:  Sudden Timing:  Constant Progression:  Worsening Chronicity:  New Context comment:  Pt put a Q-tip too far into the right ear. Relieved by:  Nothing Worsened by:  Nothing tried Ineffective treatments:  None tried Associated symptoms: no abdominal pain, no cough and no neck pain   Risk factors: no recent travel and no prior ear surgery     Past Medical History  Diagnosis Date  . ADHD (attention deficit hyperactivity disorder)   . Back pain   . Shoulder pain    History reviewed. No pertinent past surgical history. Family History  Problem Relation Age of Onset  . Stroke Father   . Seizures Mother   . Seizures Brother    History  Substance Use Topics  . Smoking status: Never Smoker   . Smokeless tobacco: Never Used  . Alcohol Use: No    Review of Systems  Constitutional: Negative for activity change.       All ROS Neg except as noted in HPI  HENT: Positive for dental problem and ear pain. Negative for nosebleeds.   Eyes: Negative for photophobia and discharge.  Respiratory: Negative for cough, shortness of breath and wheezing.   Cardiovascular: Negative for chest pain and palpitations.  Gastrointestinal: Negative for abdominal pain and blood in stool.  Genitourinary: Negative for dysuria, frequency and hematuria.  Musculoskeletal: Positive for back pain. Negative for arthralgias and neck pain.  Skin: Negative.   Neurological: Negative for dizziness, seizures and speech difficulty.   Psychiatric/Behavioral: Negative for hallucinations and confusion.    Allergies  Codeine; Lactose intolerance (gi); Percocet; and Penicillins  Home Medications   Current Outpatient Rx  Name  Route  Sig  Dispense  Refill  . alprazolam (XANAX) 2 MG tablet   Oral   Take 2 mg by mouth 4 (four) times daily as needed. For anxiety         . fluticasone (FLONASE) 50 MCG/ACT nasal spray   Nasal   Place 2 sprays into the nose daily.           . hydrOXYzine (ATARAX/VISTARIL) 25 MG tablet   Oral   Take 2 tablets (50 mg total) by mouth at bedtime as needed for itching.   12 tablet   0   . ibuprofen (ADVIL,MOTRIN) 600 MG tablet   Oral   Take 1 tablet (600 mg total) by mouth every 6 (six) hours as needed for pain.   20 tablet   0   . lithium carbonate 300 MG capsule   Oral   Take 300-600 mg by mouth 2 (two) times daily. Take one capsule (300mg ) every day in the morning, and take two capsules (600mg ) every day at bedtime         . naproxen (NAPROSYN) 500 MG tablet   Oral   Take 1 tablet (500 mg total) by mouth 2 (two) times daily.   30 tablet   0   . propranolol (INNOPRAN XL) 80 MG 24 hr capsule   Oral   Take 80  mg by mouth daily.          . QUEtiapine (SEROQUEL) 300 MG tablet   Oral   Take 300 mg by mouth at bedtime.          . ranitidine (ZANTAC) 150 MG capsule   Oral   Take 1 capsule (150 mg total) by mouth daily.   15 capsule   0   . traMADol (ULTRAM) 50 MG tablet   Oral   Take 1 tablet (50 mg total) by mouth every 6 (six) hours as needed for pain.   15 tablet   0   . HYDROcodone-acetaminophen (NORCO/VICODIN) 5-325 MG per tablet   Oral   Take 1 tablet by mouth every 4 (four) hours as needed for pain.   10 tablet   0    BP 114/51  Pulse 58  Temp(Src) 97.8 F (36.6 C) (Oral)  Resp 16  Ht 5\' 6"  (1.676 m)  Wt 140 lb (63.504 kg)  BMI 22.61 kg/m2  SpO2 99% Physical Exam  Nursing note and vitals reviewed. Constitutional: He is oriented to person,  place, and time. He appears well-developed and well-nourished.  Non-toxic appearance.  HENT:  Head: Normocephalic.  Right Ear: Tympanic membrane and external ear normal.  Left Ear: Tympanic membrane and external ear normal.  The TM on the right is intact. There is increase cerumen present, but there appears to be a scratch on the floor of the EAC on the right. No active bleeding. Multiple dental caries. No visible abscess noted.  Eyes: EOM and lids are normal. Pupils are equal, round, and reactive to light.  Neck: Normal range of motion. Neck supple. Carotid bruit is not present.  Cardiovascular: Normal rate, regular rhythm, normal heart sounds, intact distal pulses and normal pulses.   Pulmonary/Chest: Breath sounds normal. No respiratory distress.  Abdominal: Soft. Bowel sounds are normal. There is no tenderness. There is no guarding.  Musculoskeletal: Normal range of motion.  Lymphadenopathy:       Head (right side): No submandibular adenopathy present.       Head (left side): No submandibular adenopathy present.    He has no cervical adenopathy.  Neurological: He is alert and oriented to person, place, and time. He has normal strength. No cranial nerve deficit or sensory deficit.  Skin: Skin is warm and dry.  Psychiatric: He has a normal mood and affect. His speech is normal.    ED Course  Procedures (including critical care time) Labs Review Labs Reviewed - No data to display Imaging Review No results found.  MDM   1. Ear canal abrasion, unspecified laterality, initial encounter    *I have reviewed nursing notes, vital signs, and all appropriate lab and imaging results for this patient.**  Pt and father advised of findings. Pt has a small scratch of the right EAC. Pt advised on the danger of using Q-tips. Pt also has dental caries. Family made aware that his could also contribute to ear pain, and to see a dentist as soon as possible. Pt treated with auralgan, cortisporin, and  10 tabs of norco.  Kathie Dike, PA-C 07/26/13 1355

## 2013-07-26 NOTE — ED Notes (Signed)
Pt hurt rt ear with Q-tip, states he pushed Q-tip too far.

## 2013-07-26 NOTE — ED Provider Notes (Signed)
CSN: [id number]     Arrival date & time 07/26/13  1843 History   First MD Initiated Contact with Patient 07/26/13 1850     Chief Complaint  Patient presents with  . Insect Bite   (Consider location/radiation/quality/duration/timing/severity/associated sxs/prior Treatment) HPI.... several bee stings on the left upper extremity a brief time ago.   Complains of swelling, redness, pain; no respiratory distress or airway closure.   Has history of Hymenoptera allergies.  Severity is mild to moderate. Nothing makes symptoms better or worse. Patient has put ice on the wound.  Past Medical History  Diagnosis Date  . ADHD (attention deficit hyperactivity disorder)   . Back pain   . Shoulder pain    History reviewed. No pertinent past surgical history. Family History  Problem Relation Age of Onset  . Stroke Father   . Seizures Mother   . Seizures Brother    History  Substance Use Topics  . Smoking status: Never Smoker   . Smokeless tobacco: Never Used  . Alcohol Use: No    Review of Systems  All other systems reviewed and are negative.    Allergies  Codeine; Lactose intolerance (gi); Percocet; and Penicillins  Home Medications   Current Outpatient Rx  Name  Route  Sig  Dispense  Refill  . alprazolam (XANAX) 2 MG tablet   Oral   Take 2 mg by mouth 4 (four) times daily as needed. For anxiety         . fluticasone (FLONASE) 50 MCG/ACT nasal spray   Nasal   Place 2 sprays into the nose daily.           Marland Kitchen HYDROcodone-acetaminophen (NORCO/VICODIN) 5-325 MG per tablet   Oral   Take 1 tablet by mouth every 4 (four) hours as needed for pain.   10 tablet   0   . hydrOXYzine (ATARAX/VISTARIL) 25 MG tablet   Oral   Take 2 tablets (50 mg total) by mouth at bedtime as needed for itching.   12 tablet   0   . ibuprofen (ADVIL,MOTRIN) 600 MG tablet   Oral   Take 1 tablet (600 mg total) by mouth every 6 (six) hours as needed for pain.   20 tablet   0   . lithium carbonate  300 MG capsule   Oral   Take 300-600 mg by mouth 2 (two) times daily. Take one capsule (300mg ) every day in the morning, and take two capsules (600mg ) every day at bedtime         . naproxen (NAPROSYN) 500 MG tablet   Oral   Take 1 tablet (500 mg total) by mouth 2 (two) times daily.   30 tablet   0   . propranolol (INNOPRAN XL) 80 MG 24 hr capsule   Oral   Take 80 mg by mouth daily.          . QUEtiapine (SEROQUEL) 300 MG tablet   Oral   Take 300 mg by mouth at bedtime.          . ranitidine (ZANTAC) 150 MG capsule   Oral   Take 1 capsule (150 mg total) by mouth daily.   15 capsule   0   . traMADol (ULTRAM) 50 MG tablet   Oral   Take 1 tablet (50 mg total) by mouth every 6 (six) hours as needed for pain.   15 tablet   0   . EPINEPHrine (EPI-PEN) 0.3 mg/0.3 mL SOAJ injection   Intramuscular  Inject 0.3 mLs (0.3 mg total) into the muscle once.   1 Device   2    BP 109/63  Pulse 81  Temp(Src) 98.1 F (36.7 C) (Oral)  Resp 18  SpO2 100% Physical Exam  Nursing note and vitals reviewed. Constitutional: He is oriented to person, place, and time. He appears well-developed and well-nourished.  HENT:  Head: Normocephalic and atraumatic.  Eyes: Conjunctivae and EOM are normal. Pupils are equal, round, and reactive to light.  Neck: Normal range of motion. Neck supple.  Cardiovascular: Normal rate, regular rhythm and normal heart sounds.   Pulmonary/Chest: Effort normal and breath sounds normal.  Abdominal: Soft. Bowel sounds are normal.  Musculoskeletal: Normal range of motion.  Neurological: He is alert and oriented to person, place, and time.  Skin:  Several satellite lesions on left LE:  Mac-pap erythematous, edematous  Psychiatric: He has a normal mood and affect.    ED Course  Procedures (including critical care time) Labs Review Labs Reviewed - No data to display Imaging Review No results found.  MDM   1. Hymenoptera reaction, initial encounter     Patient feels better after IV fluids, IV site Medrol, Benadryl, Pepcid. Discharge home with EpiPen    Donnetta Hutching, MD 07/26/13 2022

## 2013-07-27 ENCOUNTER — Encounter (HOSPITAL_COMMUNITY): Payer: Self-pay | Admitting: Emergency Medicine

## 2013-07-27 ENCOUNTER — Emergency Department (HOSPITAL_COMMUNITY): Payer: Medicaid Other

## 2013-07-27 ENCOUNTER — Emergency Department (HOSPITAL_COMMUNITY)
Admission: EM | Admit: 2013-07-27 | Discharge: 2013-07-27 | Disposition: A | Discharge status: Home / Self Care | Payer: Medicaid Other | Attending: Emergency Medicine | Admitting: Emergency Medicine

## 2013-07-27 DIAGNOSIS — Y929 Unspecified place or not applicable: Secondary | ICD-10-CM | POA: Insufficient documentation

## 2013-07-27 DIAGNOSIS — S9031XA Contusion of right foot, initial encounter: Secondary | ICD-10-CM

## 2013-07-27 DIAGNOSIS — IMO0002 Reserved for concepts with insufficient information to code with codable children: Secondary | ICD-10-CM | POA: Insufficient documentation

## 2013-07-27 DIAGNOSIS — Y9389 Activity, other specified: Secondary | ICD-10-CM | POA: Insufficient documentation

## 2013-07-27 DIAGNOSIS — Z79899 Other long term (current) drug therapy: Secondary | ICD-10-CM | POA: Insufficient documentation

## 2013-07-27 DIAGNOSIS — W208XXA Other cause of strike by thrown, projected or falling object, initial encounter: Secondary | ICD-10-CM | POA: Insufficient documentation

## 2013-07-27 DIAGNOSIS — Z880 Allergy status to penicillin: Secondary | ICD-10-CM | POA: Insufficient documentation

## 2013-07-27 DIAGNOSIS — Z8659 Personal history of other mental and behavioral disorders: Secondary | ICD-10-CM | POA: Insufficient documentation

## 2013-07-27 DIAGNOSIS — S9030XA Contusion of unspecified foot, initial encounter: Secondary | ICD-10-CM | POA: Insufficient documentation

## 2013-07-27 MED ORDER — IBUPROFEN 800 MG PO TABS
800.0000 mg | ORAL_TABLET | Freq: Once | ORAL | Status: AC
  Administered 2013-07-27: 800 mg via ORAL
  Filled 2013-07-27: qty 1

## 2013-07-27 NOTE — ED Notes (Signed)
Rt foot pain, dropped oak table on his foot.

## 2013-07-27 NOTE — ED Provider Notes (Signed)
CSN: [id number]     Arrival date & time 07/27/13  1601 History  This chart was scribed for Eugene Aus, PA, working with Laray Anger, DO, by [facility] ED Scribe. This patient was seen in room APFT22/APFT22 and the patient's care was started at 4:16 PM.    Chief Complaint  Patient presents with  . Foot Pain    Patient is a 22 y.o. male presenting with lower extremity pain. The history is provided by the patient. No language interpreter was used.  Foot Pain This is a new problem. The current episode started less than 1 hour ago. The problem occurs constantly. The problem has not changed since onset.Pertinent negatives include no chest pain, no abdominal pain, no headaches and no shortness of breath. The symptoms are aggravated by walking. Nothing relieves the symptoms. He has tried nothing for the symptoms. The treatment provided no relief.  Foot Pain This is a new problem. The current episode started less than 1 hour ago. The problem occurs constantly. The problem has not changed since onset.Associated symptoms include numbness. Pertinent negatives include no abdominal pain, arthralgias, chest pain, chills, coughing, fatigue, fever, headaches, myalgias, nausea, neck pain, rash, sore throat, vomiting or weakness. The symptoms are aggravated by walking. He has tried nothing for the symptoms. The treatment provided no relief.    HPI Comments: Eugene Hicks is a 22 y.o. male who presents to the Emergency Department complaining of constant, moderate dorsal right foot pain onset suddenly about 45 minutes ago when pt states he dropped an thick, sold oak table on his right  foot. Pt states he was wearing thin tennis shoes, when the accident occurred. He reports associated parasthesias and numbness in the toes of his right foot. He denies pain or injury in his right lower or upper leg. Pt states that he has not taken any medications for pain, and he has not applied ice to the area. He denies  any other pain or symptoms onset at the time of the accident today, and states that he did not fall. He denies any prior injury to the right foot.  PCP- Dr. Evelene Croon   Past Medical History  Diagnosis Date  . ADHD (attention deficit hyperactivity disorder)   . Back pain   . Shoulder pain    History reviewed. No pertinent past surgical history. Family History  Problem Relation Age of Onset  . Stroke Father   . Seizures Mother   . Seizures Brother    History  Substance Use Topics  . Smoking status: Never Smoker   . Smokeless tobacco: Never Used  . Alcohol Use: No    Review of Systems  Constitutional: Negative for fever, chills and fatigue.  HENT: Negative for sore throat and trouble swallowing.   Respiratory: Negative for cough, shortness of breath and wheezing.   Cardiovascular: Negative for chest pain and palpitations.  Gastrointestinal: Negative for nausea, vomiting, abdominal pain and blood in stool.  Genitourinary: Negative for dysuria, hematuria and flank pain.  Musculoskeletal: Negative for arthralgias, back pain, myalgias, neck pain and neck stiffness.       Dorsal right foot pain.  Skin: Positive for wound (Abrasion to dorsal right foot). Negative for rash.  Neurological: Positive for numbness. Negative for dizziness, syncope, weakness and headaches.       Paresthesias to right foot.  Hematological: Does not bruise/bleed easily.    Allergies  Codeine; Lactose intolerance (gi); Percocet; and Penicillins  Home Medications   Current Outpatient Rx  Name  Route  Sig  Dispense  Refill  . alprazolam (XANAX) 2 MG tablet   Oral   Take 2 mg by mouth 4 (four) times daily as needed. For anxiety         . EPINEPHrine (EPI-PEN) 0.3 mg/0.3 mL SOAJ injection   Intramuscular   Inject 0.3 mLs (0.3 mg total) into the muscle once.   1 Device   2   . fluticasone (FLONASE) 50 MCG/ACT nasal spray   Nasal   Place 2 sprays into the nose daily.           Marland Kitchen  HYDROcodone-acetaminophen (NORCO/VICODIN) 5-325 MG per tablet   Oral   Take 1 tablet by mouth every 4 (four) hours as needed for pain.   10 tablet   0   . hydrOXYzine (ATARAX/VISTARIL) 25 MG tablet   Oral   Take 2 tablets (50 mg total) by mouth at bedtime as needed for itching.   12 tablet   0   . ibuprofen (ADVIL,MOTRIN) 600 MG tablet   Oral   Take 1 tablet (600 mg total) by mouth every 6 (six) hours as needed for pain.   20 tablet   0   . lithium carbonate 300 MG capsule   Oral   Take 300-600 mg by mouth 2 (two) times daily. Take one capsule (300mg ) every day in the morning, and take two capsules (600mg ) every day at bedtime         . naproxen (NAPROSYN) 500 MG tablet   Oral   Take 1 tablet (500 mg total) by mouth 2 (two) times daily.   30 tablet   0   . propranolol (INNOPRAN XL) 80 MG 24 hr capsule   Oral   Take 80 mg by mouth daily.          . QUEtiapine (SEROQUEL) 300 MG tablet   Oral   Take 300 mg by mouth at bedtime.          . ranitidine (ZANTAC) 150 MG capsule   Oral   Take 1 capsule (150 mg total) by mouth daily.   15 capsule   0   . traMADol (ULTRAM) 50 MG tablet   Oral   Take 1 tablet (50 mg total) by mouth every 6 (six) hours as needed for pain.   15 tablet   0    Triage Vitals: BP 105/45  Pulse 74  Temp(Src) 97.3 F (36.3 C) (Oral)  Resp 18  Ht 5\' 6"  (1.676 m)  Wt 138 lb (62.596 kg)  BMI 22.28 kg/m2  SpO2 100%  Physical Exam  Constitutional: He is oriented to person, place, and time. He appears well-developed and well-nourished.  HENT:  Head: Normocephalic and atraumatic.  Cardiovascular: Normal rate, regular rhythm, normal heart sounds and intact distal pulses.   Pulmonary/Chest: Breath sounds normal. No respiratory distress.  Musculoskeletal: He exhibits tenderness. He exhibits no edema.  Localized tenderness to palpation dorsal right foot. Slight abrasion present. DP pulse brisk. Cap refill less than 3 seconds. No proximal  tenderness. No edema.  No bony deformity  Neurological: He is alert and oriented to person, place, and time. He exhibits normal muscle tone. Coordination normal.  Skin: Skin is warm and dry.    ED Course  Procedures (including critical care time)  DIAGNOSTIC STUDIES: Oxygen Saturation is 100% on RA, normal by my interpretation.    COORDINATION OF CARE: 4:21 PM- Discussed plan to obtain an X-ray of pt's right foot. Will also  order Motrin for pain. Pt advised of plan for treatment and pt agrees.  5:06 PM- Discussed normal radiology findings with pt, indicating no fractures. Post-op shoe applied by nursing. Pt states that he feels ready for discharge.   Medications  ibuprofen (ADVIL,MOTRIN) tablet 800 mg (800 mg Oral Given 07/27/13 1655)   Labs Review Labs Reviewed - No data to display Imaging Review Dg Foot Complete Right  07/27/2013   *RADIOLOGY REPORT*  Clinical Data: Foot injury, pain  RIGHT FOOT COMPLETE - 3+ VIEW  Comparison: None.  Findings: Normal alignment without fracture.  Preserved joint spaces.  No significant arthropathy or soft tissue abnormality.  IMPRESSION: No acute finding   Original Report Authenticated By: Judie Petit. Miles Costain, M.D.      MDM   1. Contusion, foot, right, initial encounter    Pt is ambulatory with a steady gait.  Paraesthesias to the right foot are likely related to the mechanism of injury and should resolve.  Pt agrees to f/u with orthopedics if needed.     I personally performed the services described in this documentation, which was scribed in my presence. The recorded information has been reviewed and is accurate.    Mouhamadou Gittleman L. Latavion Halls, PA-C 07/29/13 1332

## 2013-07-27 NOTE — ED Notes (Signed)
Pt presents to ED secondary to a rt foot injury sustained after dropping an oak table while attempting to move it. No swelling or deformity noted.

## 2013-07-28 NOTE — ED Provider Notes (Signed)
Medical screening examination/treatment/procedure(s) were performed by non-physician practitioner and as supervising physician I was immediately available for consultation/collaboration.  Donnetta Hutching, MD 07/28/13 2221

## 2013-07-29 NOTE — ED Provider Notes (Signed)
Medical screening examination/treatment/procedure(s) were performed by non-physician practitioner and as supervising physician I was immediately available for consultation/collaboration.   Laray Anger, DO 07/29/13 1734

## 2013-08-11 ENCOUNTER — Emergency Department (HOSPITAL_COMMUNITY)
Admission: EM | Admit: 2013-08-11 | Discharge: 2013-08-11 | Disposition: A | Discharge status: Home / Self Care | Payer: Medicaid Other | Attending: Emergency Medicine | Admitting: Emergency Medicine

## 2013-08-11 ENCOUNTER — Encounter (HOSPITAL_COMMUNITY): Payer: Self-pay | Admitting: Emergency Medicine

## 2013-08-11 DIAGNOSIS — R197 Diarrhea, unspecified: Secondary | ICD-10-CM

## 2013-08-11 DIAGNOSIS — R110 Nausea: Secondary | ICD-10-CM | POA: Insufficient documentation

## 2013-08-11 DIAGNOSIS — M2550 Pain in unspecified joint: Secondary | ICD-10-CM | POA: Insufficient documentation

## 2013-08-11 DIAGNOSIS — Z79899 Other long term (current) drug therapy: Secondary | ICD-10-CM | POA: Insufficient documentation

## 2013-08-11 DIAGNOSIS — M549 Dorsalgia, unspecified: Secondary | ICD-10-CM | POA: Insufficient documentation

## 2013-08-11 DIAGNOSIS — IMO0002 Reserved for concepts with insufficient information to code with codable children: Secondary | ICD-10-CM | POA: Insufficient documentation

## 2013-08-11 DIAGNOSIS — F909 Attention-deficit hyperactivity disorder, unspecified type: Secondary | ICD-10-CM | POA: Insufficient documentation

## 2013-08-11 MED ORDER — PROMETHAZINE HCL 25 MG PO TABS: 25.0000 mg | ORAL_TABLET | Freq: Four times a day (QID) | ORAL | Status: DC | PRN

## 2013-08-11 MED ORDER — PROMETHAZINE HCL 12.5 MG PO TABS: 12.5000 mg | ORAL_TABLET | Freq: Once | ORAL | Status: DC

## 2013-08-11 MED ORDER — PB-HYOSCY-ATROPINE-SCOPOLAMINE 16.2 MG/5ML PO ELIX
5.0000 mL | ORAL_SOLUTION | Freq: Once | ORAL | Status: AC
  Administered 2013-08-11: 16.2 mg via ORAL
  Filled 2013-08-11: qty 1

## 2013-08-11 NOTE — ED Notes (Signed)
Pt c/o diarrhea several times since waking this am and nausea.  Denies vomiting.

## 2013-08-11 NOTE — Discharge Instructions (Signed)
Diarrhea Diarrhea is frequent loose and watery bowel movements. It can cause you to feel weak and dehydrated. Dehydration can cause you to become tired and thirsty, have a dry mouth, and have decreased urination that often is dark yellow. Diarrhea is a sign of another problem, most often an infection that will not last long. In most cases, diarrhea typically lasts 2 3 days. However, it can last longer if it is a sign of something more serious. It is important to treat your diarrhea as directed by your caregive to lessen or prevent future episodes of diarrhea. CAUSES  Some common causes include:  Gastrointestinal infections caused by viruses, bacteria, or parasites.  Food poisoning or food allergies.  Certain medicines, such as antibiotics, chemotherapy, and laxatives.  Artificial sweeteners and fructose.  Digestive disorders. HOME CARE INSTRUCTIONS  Ensure adequate fluid intake (hydration): have 1 cup (8 oz) of fluid for each diarrhea episode. Avoid fluids that contain simple sugars or sports drinks, fruit juices, whole milk products, and sodas. Your urine should be clear or pale yellow if you are drinking enough fluids. Hydrate with an oral rehydration solution that you can purchase at pharmacies, retail stores, and online. You can prepare an oral rehydration solution at home by mixing the following ingredients together:    tsp table salt.   tsp baking soda.   tsp salt substitute containing potassium chloride.  1  tablespoons sugar.  1 L (34 oz) of water.  Certain foods and beverages may increase the speed at which food moves through the gastrointestinal (GI) tract. These foods and beverages should be avoided and include:  Caffeinated and alcoholic beverages.  High-fiber foods, such as raw fruits and vegetables, nuts, seeds, and whole grain breads and cereals.  Foods and beverages sweetened with sugar alcohols, such as xylitol, sorbitol, and mannitol.  Some foods may be well  tolerated and may help thicken stool including:  Starchy foods, such as rice, toast, pasta, low-sugar cereal, oatmeal, grits, baked potatoes, crackers, and bagels.  Bananas.  Applesauce.  Add probiotic-rich foods to help increase healthy bacteria in the GI tract, such as yogurt and fermented milk products.  Wash your hands well after each diarrhea episode.  Only take over-the-counter or prescription medicines as directed by your caregiver.  Take a warm bath to relieve any burning or pain from frequent diarrhea episodes. SEEK IMMEDIATE MEDICAL CARE IF:   You are unable to keep fluids down.  You have persistent vomiting.  You have blood in your stool, or your stools are black and tarry.  You do not urinate in 6 8 hours, or there is only a small amount of very dark urine.  You have abdominal pain that increases or localizes.  You have weakness, dizziness, confusion, or lightheadedness.  You have a severe headache.  Your diarrhea gets worse or does not get better.  You have a fever or persistent symptoms for more than 2 3 days.  You have a fever and your symptoms suddenly get worse. MAKE SURE YOU:   Understand these instructions.  Will watch your condition.  Will get help right away if you are not doing well or get worse. Document Released: 09/25/2002 Document Revised: 09/21/2012 Document Reviewed: 06/12/2012 [employer] Patient Information 2014 [employer].  

## 2013-08-13 NOTE — ED Provider Notes (Signed)
CSN: [id number]     Arrival date & time 08/11/13  0913 History   First MD Initiated Contact with Patient 08/11/13 0932     Chief Complaint  Patient presents with  . Diarrhea   (Consider location/radiation/quality/duration/timing/severity/associated sxs/prior Treatment) Patient is a 22 y.o. male presenting with diarrhea. The history is provided by the patient.  Diarrhea Quality:  Semi-solid Severity:  Moderate Onset quality:  Gradual Duration:  3 hours Timing:  Intermittent Progression:  Unchanged Relieved by:  Nothing Worsened by:  Nothing tried Ineffective treatments:  None tried Associated symptoms: arthralgias   Associated symptoms: no abdominal pain, no fever, no myalgias and no vomiting   Associated symptoms comment:  Nausea Risk factors: sick contacts   Risk factors: no recent antibiotic use and no travel to endemic areas     Past Medical History  Diagnosis Date  . ADHD (attention deficit hyperactivity disorder)   . Back pain   . Shoulder pain    History reviewed. No pertinent past surgical history. Family History  Problem Relation Age of Onset  . Stroke Father   . Seizures Mother   . Seizures Brother    History  Substance Use Topics  . Smoking status: Never Smoker   . Smokeless tobacco: Never Used  . Alcohol Use: No    Review of Systems  Constitutional: Negative for fever and activity change.       All ROS Neg except as noted in HPI  HENT: Negative for nosebleeds.   Eyes: Negative for photophobia and discharge.  Respiratory: Negative for cough, shortness of breath and wheezing.   Cardiovascular: Negative for chest pain and palpitations.  Gastrointestinal: Positive for nausea and diarrhea. Negative for vomiting, abdominal pain and blood in stool.  Genitourinary: Negative for dysuria, frequency and hematuria.  Musculoskeletal: Positive for arthralgias and back pain. Negative for myalgias and neck pain.  Skin: Negative.   Neurological: Negative for  dizziness, seizures and speech difficulty.  Psychiatric/Behavioral: Negative for hallucinations and confusion.    Allergies  Codeine; Lactose intolerance (gi); Percocet; and Penicillins  Home Medications   Current Outpatient Rx  Name  Route  Sig  Dispense  Refill  . alprazolam (XANAX) 2 MG tablet   Oral   Take 2 mg by mouth 4 (four) times daily as needed. For anxiety         . fluticasone (FLONASE) 50 MCG/ACT nasal spray   Nasal   Place 2 sprays into the nose daily.           . hydrOXYzine (ATARAX/VISTARIL) 25 MG tablet   Oral   Take 2 tablets (50 mg total) by mouth at bedtime as needed for itching.   12 tablet   0   . ibuprofen (ADVIL,MOTRIN) 600 MG tablet   Oral   Take 1 tablet (600 mg total) by mouth every 6 (six) hours as needed for pain.   20 tablet   0   . lithium carbonate 300 MG capsule   Oral   Take 300-600 mg by mouth 2 (two) times daily. Take one capsule (300mg ) every day in the morning, and take two capsules (600mg ) every day at bedtime         . propranolol (INNOPRAN XL) 80 MG 24 hr capsule   Oral   Take 80 mg by mouth daily.          . QUEtiapine (SEROQUEL) 300 MG tablet   Oral   Take 300 mg by mouth at bedtime.          Marland Kitchen  ranitidine (ZANTAC) 150 MG capsule   Oral   Take 1 capsule (150 mg total) by mouth daily.   15 capsule   0   . EPINEPHrine (EPI-PEN) 0.3 mg/0.3 mL SOAJ injection   Intramuscular   Inject 0.3 mLs (0.3 mg total) into the muscle once.   1 Device   2   . promethazine (PHENERGAN) 25 MG tablet   Oral   Take 1 tablet (25 mg total) by mouth every 6 (six) hours as needed (Nausea, Vomiting, Diarrhea).   12 tablet   0    BP 108/69  Pulse 92  Temp(Src) 97.5 F (36.4 C) (Oral)  Resp 18  Ht 5\' 8"  (1.727 m)  Wt 130 lb (58.968 kg)  BMI 19.77 kg/m2  SpO2 98% Physical Exam  Nursing note and vitals reviewed. Constitutional: He is oriented to person, place, and time. He appears well-developed and well-nourished.   Non-toxic appearance.  HENT:  Head: Normocephalic.  Right Ear: Tympanic membrane and external ear normal.  Left Ear: Tympanic membrane and external ear normal.  Eyes: EOM and lids are normal. Pupils are equal, round, and reactive to light.  Neck: Normal range of motion. Neck supple. Carotid bruit is not present.  Cardiovascular: Normal rate, regular rhythm, normal heart sounds, intact distal pulses and normal pulses.   Pulmonary/Chest: Breath sounds normal. No respiratory distress.  Abdominal: Soft. Bowel sounds are normal. There is no tenderness. There is no rebound and no guarding.  Musculoskeletal: Normal range of motion.  Lymphadenopathy:       Head (right side): No submandibular adenopathy present.       Head (left side): No submandibular adenopathy present.    He has no cervical adenopathy.  Neurological: He is alert and oriented to person, place, and time. He has normal strength. No cranial nerve deficit or sensory deficit.  Skin: Skin is warm and dry.  Psychiatric: He has a normal mood and affect. His speech is normal.    ED Course  Procedures (including critical care time) Labs Review Labs Reviewed - No data to display Imaging Review No results found.  EKG Interpretation   None       MDM   1. Diarrhea    *I have reviewed nursing notes, vital signs, and all appropriate lab and imaging results for this patient.**  Vital signs are well within normal limits. Patient is ambulatory without problem. Mucous membranes are moist. Patient treated in the emergency department with.a towel. Prescription for promethazine every 6 hours given for nausea. Patient advised to increase fluids. He will return to the emergency department if not improving.  Kathie Dike, PA-C 08/13/13 [phone]

## 2013-08-16 NOTE — ED Provider Notes (Signed)
Medical screening examination/treatment/procedure(s) were performed by non-physician practitioner and as supervising physician I was immediately available for consultation/collaboration.  Norah Devin L Rilie Glanz, MD 08/16/13 1627 

## 2013-08-20 ENCOUNTER — Encounter (HOSPITAL_COMMUNITY): Payer: Self-pay | Admitting: Emergency Medicine

## 2013-08-20 ENCOUNTER — Emergency Department (HOSPITAL_COMMUNITY): Payer: Medicaid Other

## 2013-08-20 ENCOUNTER — Emergency Department (HOSPITAL_COMMUNITY)
Admission: EM | Admit: 2013-08-20 | Discharge: 2013-08-20 | Disposition: A | Discharge status: Home / Self Care | Payer: Medicaid Other | Attending: Emergency Medicine | Admitting: Emergency Medicine

## 2013-08-20 DIAGNOSIS — S20212A Contusion of left front wall of thorax, initial encounter: Secondary | ICD-10-CM

## 2013-08-20 DIAGNOSIS — H9201 Otalgia, right ear: Secondary | ICD-10-CM

## 2013-08-20 DIAGNOSIS — Z880 Allergy status to penicillin: Secondary | ICD-10-CM | POA: Insufficient documentation

## 2013-08-20 DIAGNOSIS — Z8659 Personal history of other mental and behavioral disorders: Secondary | ICD-10-CM | POA: Insufficient documentation

## 2013-08-20 DIAGNOSIS — H9209 Otalgia, unspecified ear: Secondary | ICD-10-CM | POA: Insufficient documentation

## 2013-08-20 DIAGNOSIS — Z79899 Other long term (current) drug therapy: Secondary | ICD-10-CM | POA: Insufficient documentation

## 2013-08-20 DIAGNOSIS — W108XXA Fall (on) (from) other stairs and steps, initial encounter: Secondary | ICD-10-CM | POA: Insufficient documentation

## 2013-08-20 DIAGNOSIS — Z8739 Personal history of other diseases of the musculoskeletal system and connective tissue: Secondary | ICD-10-CM | POA: Insufficient documentation

## 2013-08-20 DIAGNOSIS — H6120 Impacted cerumen, unspecified ear: Secondary | ICD-10-CM | POA: Insufficient documentation

## 2013-08-20 DIAGNOSIS — S20219A Contusion of unspecified front wall of thorax, initial encounter: Secondary | ICD-10-CM | POA: Insufficient documentation

## 2013-08-20 DIAGNOSIS — Y939 Activity, unspecified: Secondary | ICD-10-CM | POA: Insufficient documentation

## 2013-08-20 DIAGNOSIS — Y929 Unspecified place or not applicable: Secondary | ICD-10-CM | POA: Insufficient documentation

## 2013-08-20 MED ORDER — HYDROCODONE-ACETAMINOPHEN 5-325 MG PO TABS
1.0000 | ORAL_TABLET | Freq: Once | ORAL | Status: AC
  Administered 2013-08-20: 1 via ORAL
  Filled 2013-08-20: qty 1

## 2013-08-20 MED ORDER — ANTIPYRINE-BENZOCAINE 5.4-1.4 % OT SOLN
3.0000 [drp] | Freq: Once | OTIC | Status: AC
  Administered 2013-08-20: 4 [drp] via OTIC
  Filled 2013-08-20: qty 10

## 2013-08-20 MED ORDER — IBUPROFEN 800 MG PO TABS: 800.0000 mg | ORAL_TABLET | Freq: Three times a day (TID) | ORAL | Status: DC

## 2013-08-20 NOTE — ED Notes (Signed)
Pt was taking out the trash this morning, slipped on steps, causing pt to fall, hitting left side rib area against the concrete steps, denies any LOC, hitting his head. C/o pain to left rib cage area. Denies any sob, has bilateral breath sounds in triage,

## 2013-08-20 NOTE — ED Notes (Addendum)
Pt going to Xray.  Reports fell down 3 concrete steps today and c/o pain to left ribs.    No bruising noted at this time.  No respiratory distress.

## 2013-08-21 ENCOUNTER — Emergency Department (HOSPITAL_COMMUNITY)
Admission: EM | Admit: 2013-08-21 | Discharge: 2013-08-21 | Disposition: A | Discharge status: Home / Self Care | Payer: Medicaid Other | Attending: Emergency Medicine | Admitting: Emergency Medicine

## 2013-08-21 ENCOUNTER — Encounter (HOSPITAL_COMMUNITY): Payer: Self-pay | Admitting: Emergency Medicine

## 2013-08-21 DIAGNOSIS — G8911 Acute pain due to trauma: Secondary | ICD-10-CM | POA: Insufficient documentation

## 2013-08-21 DIAGNOSIS — S20212D Contusion of left front wall of thorax, subsequent encounter: Secondary | ICD-10-CM

## 2013-08-21 DIAGNOSIS — Z79899 Other long term (current) drug therapy: Secondary | ICD-10-CM | POA: Insufficient documentation

## 2013-08-21 DIAGNOSIS — Z880 Allergy status to penicillin: Secondary | ICD-10-CM | POA: Insufficient documentation

## 2013-08-21 DIAGNOSIS — IMO0002 Reserved for concepts with insufficient information to code with codable children: Secondary | ICD-10-CM | POA: Insufficient documentation

## 2013-08-21 DIAGNOSIS — M2540 Effusion, unspecified joint: Secondary | ICD-10-CM | POA: Insufficient documentation

## 2013-08-21 DIAGNOSIS — Z8659 Personal history of other mental and behavioral disorders: Secondary | ICD-10-CM | POA: Insufficient documentation

## 2013-08-21 DIAGNOSIS — R079 Chest pain, unspecified: Secondary | ICD-10-CM | POA: Insufficient documentation

## 2013-08-21 MED ORDER — HYDROCODONE-ACETAMINOPHEN 5-325 MG PO TABS: ORAL_TABLET | ORAL | Status: DC

## 2013-08-21 MED ORDER — IBUPROFEN 800 MG PO TABS: 800.0000 mg | ORAL_TABLET | Freq: Three times a day (TID) | ORAL | Status: DC

## 2013-08-21 NOTE — ED Provider Notes (Signed)
CSN: [id number]     Arrival date & time 08/21/13  1426 History   First MD Initiated Contact with Patient 08/21/13 1552     Chief Complaint  Patient presents with  . Fall   (Consider location/radiation/quality/duration/timing/severity/associated sxs/prior Treatment) Patient is a 22 y.o. male presenting with fall. The history is provided by the patient and a parent.  Fall This is a new problem. The current episode started yesterday. The problem occurs constantly. The problem has been unchanged. Associated symptoms include chest pain and joint swelling. Pertinent negatives include no abdominal pain, arthralgias, chills, congestion, coughing, diaphoresis, fever, headaches, nausea, neck pain, numbness, sore throat, vertigo, visual change, vomiting or weakness. Associated symptoms comments: Left lateral rib pain. Exacerbated by: movement and palpation. He has tried nothing for the symptoms. The treatment provided no relief.   Patient was seen here yesterday for same.  Father states that he lost the prescription for Ibuprofen that he was given yesterday and patient did not have anything for pain last evening.  States he has worsening pain with movement and palpation of the lateral ribs.  He denies shortness of breath, abdominal pain, fever, cough or back pain  Past Medical History  Diagnosis Date  . ADHD (attention deficit hyperactivity disorder)   . Back pain   . Shoulder pain    History reviewed. No pertinent past surgical history. Family History  Problem Relation Age of Onset  . Stroke Father   . Seizures Mother   . Seizures Brother    History  Substance Use Topics  . Smoking status: Never Smoker   . Smokeless tobacco: Never Used  . Alcohol Use: No    Review of Systems  Constitutional: Negative for fever, chills and diaphoresis.  HENT: Negative for congestion and sore throat.   Respiratory: Negative for cough, chest tightness, shortness of breath, wheezing and stridor.     Cardiovascular: Positive for chest pain. Negative for leg swelling.       Rib pain  Gastrointestinal: Negative for nausea, vomiting, abdominal pain and abdominal distention.  Genitourinary: Negative for dysuria, hematuria, flank pain and difficulty urinating.  Musculoskeletal: Positive for joint swelling. Negative for arthralgias and neck pain.  Skin: Negative for color change and wound.  Neurological: Negative for dizziness, vertigo, weakness, numbness and headaches.  All other systems reviewed and are negative.    Allergies  Codeine; Lactose intolerance (gi); Percocet; and Penicillins  Home Medications   Current Outpatient Rx  Name  Route  Sig  Dispense  Refill  . alprazolam (XANAX) 2 MG tablet   Oral   Take 2 mg by mouth 4 (four) times daily as needed. For anxiety         . fluticasone (FLONASE) 50 MCG/ACT nasal spray   Nasal   Place 2 sprays into the nose daily.           . hydrOXYzine (ATARAX/VISTARIL) 25 MG tablet   Oral   Take 2 tablets (50 mg total) by mouth at bedtime as needed for itching.   12 tablet   0   . ibuprofen (ADVIL,MOTRIN) 800 MG tablet   Oral   Take 1 tablet (800 mg total) by mouth 3 (three) times daily. Take with food   15 tablet   0   . lithium carbonate 300 MG capsule   Oral   Take 300-600 mg by mouth 2 (two) times daily. Take one capsule (300mg ) every day in the morning, and take two capsules (600mg ) every day at bedtime         .  promethazine (PHENERGAN) 25 MG tablet   Oral   Take 1 tablet (25 mg total) by mouth every 6 (six) hours as needed (Nausea, Vomiting, Diarrhea).   12 tablet   0   . propranolol (INNOPRAN XL) 80 MG 24 hr capsule   Oral   Take 80 mg by mouth daily.          . QUEtiapine (SEROQUEL) 300 MG tablet   Oral   Take 300 mg by mouth at bedtime.          . ranitidine (ZANTAC) 150 MG capsule   Oral   Take 1 capsule (150 mg total) by mouth daily.   15 capsule   0   . EPINEPHrine (EPI-PEN) 0.3 mg/0.3 mL  SOAJ injection   Intramuscular   Inject 0.3 mLs (0.3 mg total) into the muscle once.   1 Device   2    BP 113/58  Pulse 63  Temp(Src) 97.9 F (36.6 C) (Oral)  Resp 20  Ht 5\' 6"  (1.676 m)  Wt 140 lb (63.504 kg)  BMI 22.61 kg/m2  SpO2 100% Physical Exam  Nursing note and vitals reviewed. Constitutional: He appears well-developed and well-nourished. No distress.  HENT:  Head: Normocephalic and atraumatic.  Cardiovascular: Normal rate, regular rhythm, normal heart sounds and intact distal pulses.   No murmur heard. Pulmonary/Chest: Effort normal and breath sounds normal. Not tachypneic. No respiratory distress. He has no decreased breath sounds. He has no wheezes. He has no rales. He exhibits tenderness and bony tenderness. He exhibits no laceration, no crepitus, no edema, no deformity, no swelling and no retraction.    Localized tenderness to palpation of the left lower lateral ribs. No edema, bruising, abrasion, or crepitus  Abdominal: Soft. He exhibits no distension and no mass. There is no tenderness. There is no rebound and no guarding.  Musculoskeletal: Normal range of motion.  Neurological: He is alert. He exhibits normal muscle tone. Coordination normal.  Skin: Skin is warm and dry.    ED Course  Procedures (including critical care time) Labs Review Labs Reviewed - No data to display Imaging Review Dg Ribs Unilateral W/chest Left  08/20/2013   CLINICAL DATA:  Initial encounter for left rib injury when the patient fell onto concrete steps.  EXAM: LEFT RIBS AND CHEST - 3+ VIEW  COMPARISON:  No prior rib imaging. Chest x-rays 06/13/2013 dating back to 06/04/2012.  FINDINGS: No acute fractures identified involving the ribs. No intrinsic osseous abnormality. Well preserved bone mineral density.  Cardiomediastinal silhouette unremarkable and unchanged. Lungs clear. Bronchovascular markings normal. Pulmonary vascularity normal. No visible pleural effusions. No pneumothorax.   IMPRESSION: 1. No left rib fractures identified. 2. No acute cardiopulmonary disease.   Electronically Signed   By: Hulan Saas M.D.   On: 08/20/2013 11:11    EKG Interpretation   None       MDM   Previous ED chart reviewed.    Patient seen by me on previous visit as well.  No bruising, edema, crepitus or bony abnormalities of the left chest wall. Localized tenderness at the lower lateral left ribs.  No guarding, abdomen is soft and non-tender. Patient ambulates with a steady gait.  Vital signs are stable no hypoxia, tachypnea or tachycardia. I have again reviewed chest x-ray results  with the patient and advised the patient and his father that he will need to followup with his primary care physician for recheck. No concerning symptoms for pneumothorax at this time.  Patient ambulates  w/o difficulty.  Appears stable for discharge.   Ry Moody L. Trisha Mangle, PA-C 08/21/13 1641

## 2013-08-21 NOTE — ED Notes (Signed)
Pt's father in room and speaks for pt, father denies getting prescription for ibuprofen filled yesterday, pt was seen for same yesterday here, pt's father wants pt to get a shot for pain and pain meds for home

## 2013-08-21 NOTE — ED Notes (Signed)
Pt fell yesterday, here yesterday for eval but "didnt give him nothing" per father. Pt holding left rib area. Nad.

## 2013-08-21 NOTE — ED Provider Notes (Signed)
CSN: [id number]     Arrival date & time 08/20/13  8295 History   First MD Initiated Contact with Patient 08/20/13 1117     Chief Complaint  Patient presents with  . Rib Injury   (Consider location/radiation/quality/duration/timing/severity/associated sxs/prior Treatment) HPI Comments: Eugene Hicks is a 22 y.o. male who presents to the Emergency Department complaining of pain to his left lateral ribs after he slipped and fell on some steps, striking his ribs on the steps.  Father denies LOC.  Patient denies head injury, neck pain, abdominal pain, or shortness of breath.  States pain is worse with deep breathing and certain movements.  Has not tried any therapies prior to ED arrival   Patient also c/o pain to right ear for several days.    The history is provided by the patient and a parent.    Past Medical History  Diagnosis Date  . ADHD (attention deficit hyperactivity disorder)   . Back pain   . Shoulder pain    History reviewed. No pertinent past surgical history. Family History  Problem Relation Age of Onset  . Stroke Father   . Seizures Mother   . Seizures Brother    History  Substance Use Topics  . Smoking status: Never Smoker   . Smokeless tobacco: Never Used  . Alcohol Use: No    Review of Systems  Constitutional: Negative for fever and chills.  Respiratory: Negative for cough, chest tightness and shortness of breath.   Cardiovascular: Positive for chest pain.  Gastrointestinal: Negative for nausea, vomiting and abdominal pain.  Genitourinary: Negative for dysuria and difficulty urinating.  Musculoskeletal: Positive for arthralgias and joint swelling. Negative for gait problem and neck pain.  Skin: Negative for color change and wound.  Neurological: Negative for dizziness, syncope, speech difficulty and headaches.  All other systems reviewed and are negative.    Allergies  Codeine; Lactose intolerance (gi); Percocet; and Penicillins  Home Medications    Current Outpatient Rx  Name  Route  Sig  Dispense  Refill  . alprazolam (XANAX) 2 MG tablet   Oral   Take 2 mg by mouth 4 (four) times daily as needed. For anxiety         . EPINEPHrine (EPI-PEN) 0.3 mg/0.3 mL SOAJ injection   Intramuscular   Inject 0.3 mLs (0.3 mg total) into the muscle once.   1 Device   2   . fluticasone (FLONASE) 50 MCG/ACT nasal spray   Nasal   Place 2 sprays into the nose daily.           Marland Kitchen HYDROcodone-acetaminophen (NORCO/VICODIN) 5-325 MG per tablet      Take one tab po q 4-6 hrs prn pain   8 tablet   0   . hydrOXYzine (ATARAX/VISTARIL) 25 MG tablet   Oral   Take 2 tablets (50 mg total) by mouth at bedtime as needed for itching.   12 tablet   0   . ibuprofen (ADVIL,MOTRIN) 800 MG tablet   Oral   Take 1 tablet (800 mg total) by mouth 3 (three) times daily.   21 tablet   0   . lithium carbonate 300 MG capsule   Oral   Take 300-600 mg by mouth 2 (two) times daily. Take one capsule (300mg ) every day in the morning, and take two capsules (600mg ) every day at bedtime         . promethazine (PHENERGAN) 25 MG tablet   Oral   Take 1 tablet (25  mg total) by mouth every 6 (six) hours as needed (Nausea, Vomiting, Diarrhea).   12 tablet   0   . propranolol (INNOPRAN XL) 80 MG 24 hr capsule   Oral   Take 80 mg by mouth daily.          . QUEtiapine (SEROQUEL) 300 MG tablet   Oral   Take 300 mg by mouth at bedtime.          . ranitidine (ZANTAC) 150 MG capsule   Oral   Take 1 capsule (150 mg total) by mouth daily.   15 capsule   0    BP 120/82  Pulse 92  Temp(Src) 97.8 F (36.6 C) (Oral)  Resp 18  SpO2 95% Physical Exam  Nursing note and vitals reviewed. Constitutional: He is oriented to person, place, and time. He appears well-developed and well-nourished. No distress.  HENT:  Head: Normocephalic and atraumatic.  Right Ear: Tympanic membrane and ear canal normal.  Left Ear: Tympanic membrane and ear canal normal.   Mouth/Throat: Uvula is midline, oropharynx is clear and moist and mucous membranes are normal. No oropharyngeal exudate.  Moderate amt of cerumen to the bilateral ears.  TM are visualized and appear nml.    Eyes: EOM are normal. Pupils are equal, round, and reactive to light.  Neck: Normal range of motion, full passive range of motion without pain and phonation normal. Neck supple.  Cardiovascular: Normal rate, regular rhythm, normal heart sounds and intact distal pulses.   No murmur heard. Pulmonary/Chest: Effort normal and breath sounds normal. No stridor. No respiratory distress. He has no decreased breath sounds. He has no wheezes. He has no rales. Chest wall is not dull to percussion. He exhibits bony tenderness. He exhibits no mass, no tenderness, no laceration, no crepitus, no edema, no deformity, no swelling and no retraction.    Localized ttp of the lower left lateral ribs, no crepitus, guarding , bruising or edema.    Abdominal: Soft. He exhibits no distension and no mass. There is no tenderness. There is no rebound and no guarding.  Musculoskeletal: Normal range of motion. He exhibits no edema.  Lymphadenopathy:    He has no cervical adenopathy.  Neurological: He is alert and oriented to person, place, and time. He exhibits normal muscle tone. Coordination normal.  Skin: Skin is warm and dry.    ED Course  Procedures (including critical care time) Labs Review Labs Reviewed - No data to display Imaging Review Dg Ribs Unilateral W/chest Left  08/20/2013   CLINICAL DATA:  Initial encounter for left rib injury when the patient fell onto concrete steps.  EXAM: LEFT RIBS AND CHEST - 3+ VIEW  COMPARISON:  No prior rib imaging. Chest x-rays 06/13/2013 dating back to 06/04/2012.  FINDINGS: No acute fractures identified involving the ribs. No intrinsic osseous abnormality. Well preserved bone mineral density.  Cardiomediastinal silhouette unremarkable and unchanged. Lungs clear.  Bronchovascular markings normal. Pulmonary vascularity normal. No visible pleural effusions. No pneumothorax.  IMPRESSION: 1. No left rib fractures identified. 2. No acute cardiopulmonary disease.   Electronically Signed   By: Hulan Saas M.D.   On: 08/20/2013 11:11    EKG Interpretation   None       MDM   1. Rib contusion, left, initial encounter   2. Otalgia of right ear     Patient and father advised of x-ray findings.  Lung sounds are CTA bilaterally.  No guarding or crepitus on exam.  Auralgan otic dispensed  at father's request.  Agrees to symptomatic treatment with ibuprofen, also advised to take deep breath and cough frequently.  Father agrees to care plan and verbalized understanding.  Pt ambulatory and appears stable for d/c.   Caydan Mctavish L. Trisha Mangle, PA-C 08/21/13 2113

## 2013-08-21 NOTE — ED Provider Notes (Signed)
Medical screening examination/treatment/procedure(s) were performed by non-physician practitioner and as supervising physician I was immediately available for consultation/collaboration.  EKG Interpretation   None         Benny Lennert, MD 08/21/13 2310

## 2013-08-24 ENCOUNTER — Encounter (HOSPITAL_COMMUNITY): Payer: Self-pay | Admitting: Emergency Medicine

## 2013-08-24 ENCOUNTER — Emergency Department (HOSPITAL_COMMUNITY)
Admission: EM | Admit: 2013-08-24 | Discharge: 2013-08-24 | Disposition: A | Discharge status: Home / Self Care | Payer: Medicaid Other | Attending: Emergency Medicine | Admitting: Emergency Medicine

## 2013-08-24 DIAGNOSIS — G8911 Acute pain due to trauma: Secondary | ICD-10-CM | POA: Insufficient documentation

## 2013-08-24 DIAGNOSIS — Z87828 Personal history of other (healed) physical injury and trauma: Secondary | ICD-10-CM | POA: Insufficient documentation

## 2013-08-24 DIAGNOSIS — R109 Unspecified abdominal pain: Secondary | ICD-10-CM | POA: Insufficient documentation

## 2013-08-24 DIAGNOSIS — R079 Chest pain, unspecified: Secondary | ICD-10-CM | POA: Insufficient documentation

## 2013-08-24 DIAGNOSIS — Z79899 Other long term (current) drug therapy: Secondary | ICD-10-CM | POA: Insufficient documentation

## 2013-08-24 DIAGNOSIS — M549 Dorsalgia, unspecified: Secondary | ICD-10-CM | POA: Insufficient documentation

## 2013-08-24 DIAGNOSIS — Z8659 Personal history of other mental and behavioral disorders: Secondary | ICD-10-CM | POA: Insufficient documentation

## 2013-08-24 DIAGNOSIS — Z880 Allergy status to penicillin: Secondary | ICD-10-CM | POA: Insufficient documentation

## 2013-08-24 DIAGNOSIS — R05 Cough: Secondary | ICD-10-CM

## 2013-08-24 DIAGNOSIS — R0781 Pleurodynia: Secondary | ICD-10-CM

## 2013-08-24 DIAGNOSIS — R059 Cough, unspecified: Secondary | ICD-10-CM | POA: Insufficient documentation

## 2013-08-24 DIAGNOSIS — IMO0002 Reserved for concepts with insufficient information to code with codable children: Secondary | ICD-10-CM | POA: Insufficient documentation

## 2013-08-24 MED ORDER — CIPROFLOXACIN HCL 250 MG PO TABS
500.0000 mg | ORAL_TABLET | Freq: Once | ORAL | Status: AC
  Administered 2013-08-24: 500 mg via ORAL
  Filled 2013-08-24: qty 2

## 2013-08-24 NOTE — ED Notes (Signed)
Burning in chest, cough , being tx for bronchitis, with Z pack.

## 2013-08-24 NOTE — ED Provider Notes (Signed)
CSN: [id number]     Arrival date & time 08/24/13  1414 History   First MD Initiated Contact with Patient 08/24/13 1456     Chief Complaint  Patient presents with  . Cough   (Consider location/radiation/quality/duration/timing/severity/associated sxs/prior Treatment) HPI Comments: The patient was evaluated on November 2 with complaint of injury to the ribs, x-rays were obtained and found to be negative, patient was treated and released from the hospital. The patient returned to the emergency department on November 3 with continued problems, he was evaluated and referred to his primary physician. The patient's father states that he took him to the primary physician and he was placed on a Zithromax Z-Pak in addition to the medications that were ordered here in the emergency department, but he feels that the symptoms are not going away. The father brought the patient back to the emergency department for additional evaluation.  Patient is a 22 y.o. male presenting with cough. The history is provided by the patient and a parent.  Cough Associated symptoms: no chest pain, no eye discharge, no shortness of breath and no wheezing     Past Medical History  Diagnosis Date  . ADHD (attention deficit hyperactivity disorder)   . Back pain   . Shoulder pain    History reviewed. No pertinent past surgical history. Family History  Problem Relation Age of Onset  . Stroke Father   . Seizures Mother   . Seizures Brother    History  Substance Use Topics  . Smoking status: Never Smoker   . Smokeless tobacco: Never Used  . Alcohol Use: No    Review of Systems  Constitutional: Negative for activity change.       All ROS Neg except as noted in HPI  HENT: Negative for nosebleeds.   Eyes: Negative for photophobia and discharge.  Respiratory: Positive for cough. Negative for shortness of breath and wheezing.   Cardiovascular: Negative for chest pain and palpitations.  Gastrointestinal: Negative for  abdominal pain and blood in stool.  Genitourinary: Positive for flank pain. Negative for dysuria, frequency and hematuria.  Musculoskeletal: Positive for back pain. Negative for arthralgias and neck pain.  Skin: Negative.   Neurological: Negative for dizziness, seizures and speech difficulty.  Psychiatric/Behavioral: Negative for hallucinations and confusion.    Allergies  Codeine; Lactose intolerance (gi); Percocet; and Penicillins  Home Medications   Current Outpatient Rx  Name  Route  Sig  Dispense  Refill  . alprazolam (XANAX) 2 MG tablet   Oral   Take 2 mg by mouth 4 (four) times daily as needed. For anxiety         . fluticasone (FLONASE) 50 MCG/ACT nasal spray   Nasal   Place 2 sprays into the nose daily.           Marland Kitchen lithium carbonate 300 MG capsule   Oral   Take 300-600 mg by mouth 2 (two) times daily. Take one capsule (300mg ) every day in the morning, and take two capsules (600mg ) every day at bedtime         . promethazine (PHENERGAN) 25 MG tablet   Oral   Take 1 tablet (25 mg total) by mouth every 6 (six) hours as needed (Nausea, Vomiting, Diarrhea).   12 tablet   0   . propranolol (INNOPRAN XL) 80 MG 24 hr capsule   Oral   Take 80 mg by mouth daily.          . QUEtiapine (SEROQUEL) 300 MG tablet  Oral   Take 300 mg by mouth every evening.          . ranitidine (ZANTAC) 150 MG capsule   Oral   Take 1 capsule (150 mg total) by mouth daily.   15 capsule   0   . EPINEPHrine (EPI-PEN) 0.3 mg/0.3 mL SOAJ injection   Intramuscular   Inject 0.3 mLs (0.3 mg total) into the muscle once.   1 Device   2   . HYDROcodone-acetaminophen (NORCO/VICODIN) 5-325 MG per tablet      Take one tab po q 4-6 hrs prn pain   8 tablet   0   . hydrOXYzine (ATARAX/VISTARIL) 25 MG tablet   Oral   Take 2 tablets (50 mg total) by mouth at bedtime as needed for itching.   12 tablet   0   . ibuprofen (ADVIL,MOTRIN) 800 MG tablet   Oral   Take 1 tablet (800 mg  total) by mouth 3 (three) times daily.   21 tablet   0    BP 114/61  Pulse 70  Temp(Src) 97.8 F (36.6 C) (Oral)  Resp 18  Ht 5\' 6"  (1.676 m)  Wt 138 lb (62.596 kg)  BMI 22.28 kg/m2 Physical Exam  Nursing note and vitals reviewed. Constitutional: He is oriented to person, place, and time. He appears well-developed and well-nourished.  Non-toxic appearance.  HENT:  Head: Normocephalic.  Right Ear: Tympanic membrane and external ear normal.  Left Ear: Tympanic membrane and external ear normal.  Nasal congestion present.  Eyes: EOM and lids are normal. Pupils are equal, round, and reactive to light.  Neck: Normal range of motion. Neck supple. Carotid bruit is not present.  Cardiovascular: Normal rate, regular rhythm, normal heart sounds, intact distal pulses and normal pulses.   Pulmonary/Chest: Breath sounds normal. No respiratory distress.  Course breath sounds present. Patient speaks in complete sentences. No palpable deformity of the ribs, right or left. No focal findings.  Abdominal: Soft. Bowel sounds are normal. There is no tenderness. There is no guarding.  Musculoskeletal: Normal range of motion.  Lymphadenopathy:       Head (right side): No submandibular adenopathy present.       Head (left side): No submandibular adenopathy present.    He has no cervical adenopathy.  Neurological: He is alert and oriented to person, place, and time. He has normal strength. No cranial nerve deficit or sensory deficit.  Skin: Skin is warm and dry.  Psychiatric: He has a normal mood and affect. His speech is normal.    ED Course  Procedures (including critical care time) Labs Review Labs Reviewed - No data to display Imaging Review No results found.  EKG Interpretation   None       MDM   1. Cough   2. Rib pain    *I have reviewed nursing notes, vital signs, and all appropriate lab and imaging results for this patient.**  I have reviewed the x-rays from patient's previous  visits. I reviewed the emergency department records involving previous visits. Patient is ambulatory without problem. Speaks in complete sentences. Vital signs are within normal limits.  Patient's father states that he would like for the patient to receive the" apothecary cough syrup". This is a custom-made syrup at the pharmacy. Patient states that he has had similar problem in members of the family have had similar symptoms in this medication seemed to help.  Patient advised to finish his Zithromax Z-Pak. Increase fluids. Wash hands frequently. Continue his  anti-inflammatory medication, prescription for apothecary cough syrup is given to the patient.  Kathie Dike, PA-C 08/24/13 1807

## 2013-08-24 NOTE — ED Notes (Signed)
No answer

## 2013-08-25 NOTE — ED Provider Notes (Signed)
Medical screening examination/treatment/procedure(s) were performed by non-physician practitioner and as supervising physician I was immediately available for consultation/collaboration.  EKG Interpretation   None        Shelda Jakes, MD 08/25/13 [phone]

## 2013-08-27 NOTE — ED Provider Notes (Signed)
History/physical exam/procedure(s) were performed by non-physician practitioner and as supervising physician I was immediately available for consultation/collaboration. I have reviewed all notes and am in agreement with care and plan.   Hilario Quarry, MD 08/27/13 [phone]

## 2013-08-28 ENCOUNTER — Emergency Department (HOSPITAL_COMMUNITY)
Admission: EM | Admit: 2013-08-28 | Discharge: 2013-08-28 | Disposition: A | Discharge status: Home / Self Care | Payer: Medicaid Other | Attending: Emergency Medicine | Admitting: Emergency Medicine

## 2013-08-28 ENCOUNTER — Encounter (HOSPITAL_COMMUNITY): Payer: Self-pay | Admitting: Emergency Medicine

## 2013-08-28 ENCOUNTER — Emergency Department (HOSPITAL_COMMUNITY): Payer: Medicaid Other

## 2013-08-28 DIAGNOSIS — Z8739 Personal history of other diseases of the musculoskeletal system and connective tissue: Secondary | ICD-10-CM | POA: Insufficient documentation

## 2013-08-28 DIAGNOSIS — Z8659 Personal history of other mental and behavioral disorders: Secondary | ICD-10-CM | POA: Insufficient documentation

## 2013-08-28 DIAGNOSIS — Y939 Activity, unspecified: Secondary | ICD-10-CM | POA: Insufficient documentation

## 2013-08-28 DIAGNOSIS — S6000XA Contusion of unspecified finger without damage to nail, initial encounter: Secondary | ICD-10-CM

## 2013-08-28 DIAGNOSIS — Z880 Allergy status to penicillin: Secondary | ICD-10-CM | POA: Insufficient documentation

## 2013-08-28 DIAGNOSIS — Z791 Long term (current) use of non-steroidal anti-inflammatories (NSAID): Secondary | ICD-10-CM | POA: Insufficient documentation

## 2013-08-28 DIAGNOSIS — Y929 Unspecified place or not applicable: Secondary | ICD-10-CM | POA: Insufficient documentation

## 2013-08-28 DIAGNOSIS — Z79899 Other long term (current) drug therapy: Secondary | ICD-10-CM | POA: Insufficient documentation

## 2013-08-28 DIAGNOSIS — IMO0002 Reserved for concepts with insufficient information to code with codable children: Secondary | ICD-10-CM | POA: Insufficient documentation

## 2013-08-28 MED ORDER — HYDROCODONE-ACETAMINOPHEN 5-325 MG PO TABS
1.0000 | ORAL_TABLET | Freq: Once | ORAL | Status: AC
  Administered 2013-08-28: 1 via ORAL
  Filled 2013-08-28: qty 1

## 2013-08-28 NOTE — ED Notes (Signed)
LMF injury , closed in wood stove

## 2013-08-28 NOTE — ED Notes (Signed)
Attempted to call to exan room, no answer

## 2013-08-29 NOTE — ED Provider Notes (Signed)
CSN: [id number]     Arrival date & time 08/28/13  1751 History   First MD Initiated Contact with Patient 08/28/13 1837     Chief Complaint  Patient presents with  . Finger Injury   (Consider location/radiation/quality/duration/timing/severity/associated sxs/prior Treatment) Patient is a 22 y.o. male presenting with hand pain. The history is provided by the patient and a relative.  Hand Pain This is a new problem. The current episode started today. The problem occurs constantly. The problem has been unchanged. Associated symptoms include arthralgias. Pertinent negatives include no chills, fever, headaches, joint swelling, neck pain, numbness, vomiting or weakness. The symptoms are aggravated by bending (movement and palpation). The treatment provided no relief.   Patient c/o pain to the left middle finger that occurred from a direct blow.  Pain is worse with movement and improves with rest.  Has not tried any therapies at home prior to ED arrival   Past Medical History  Diagnosis Date  . ADHD (attention deficit hyperactivity disorder)   . Back pain   . Shoulder pain    History reviewed. No pertinent past surgical history. Family History  Problem Relation Age of Onset  . Stroke Father   . Seizures Mother   . Seizures Brother    History  Substance Use Topics  . Smoking status: Never Smoker   . Smokeless tobacco: Never Used  . Alcohol Use: No    Review of Systems  Constitutional: Negative for fever and chills.  Gastrointestinal: Negative for vomiting.  Genitourinary: Negative for dysuria and difficulty urinating.  Musculoskeletal: Positive for arthralgias. Negative for joint swelling and neck pain.  Skin: Negative for color change and wound.  Neurological: Negative for weakness, numbness and headaches.  All other systems reviewed and are negative.    Allergies  Codeine; Lactose intolerance (gi); Percocet; and Penicillins  Home Medications   Current Outpatient Rx  Name   Route  Sig  Dispense  Refill  . alprazolam (XANAX) 2 MG tablet   Oral   Take 2 mg by mouth 4 (four) times daily as needed. For anxiety         . fluticasone (FLONASE) 50 MCG/ACT nasal spray   Nasal   Place 2 sprays into the nose daily.           Marland Kitchen HYDROcodone-acetaminophen (NORCO/VICODIN) 5-325 MG per tablet      Take one tab po q 4-6 hrs prn pain   8 tablet   0   . ibuprofen (ADVIL,MOTRIN) 800 MG tablet   Oral   Take 1 tablet (800 mg total) by mouth 3 (three) times daily.   21 tablet   0   . lithium carbonate 300 MG capsule   Oral   Take 300-600 mg by mouth 2 (two) times daily. Take one capsule (300mg ) every day in the morning, and take two capsules (600mg ) every day at bedtime         . promethazine (PHENERGAN) 25 MG tablet   Oral   Take 1 tablet (25 mg total) by mouth every 6 (six) hours as needed (Nausea, Vomiting, Diarrhea).   12 tablet   0   . propranolol (INNOPRAN XL) 80 MG 24 hr capsule   Oral   Take 80 mg by mouth daily.          . QUEtiapine (SEROQUEL) 300 MG tablet   Oral   Take 300 mg by mouth every evening.          . ranitidine (ZANTAC) 150  MG capsule   Oral   Take 1 capsule (150 mg total) by mouth daily.   15 capsule   0   . EPINEPHrine (EPI-PEN) 0.3 mg/0.3 mL SOAJ injection   Intramuscular   Inject 0.3 mLs (0.3 mg total) into the muscle once.   1 Device   2   . hydrOXYzine (ATARAX/VISTARIL) 25 MG tablet   Oral   Take 2 tablets (50 mg total) by mouth at bedtime as needed for itching.   12 tablet   0    BP 99/61  Pulse 56  Temp(Src) 97.9 F (36.6 C) (Oral)  Resp 20  SpO2 100% Physical Exam  Nursing note and vitals reviewed. Constitutional: He is oriented to person, place, and time. He appears well-developed and well-nourished. No distress.  HENT:  Head: Normocephalic and atraumatic.  Musculoskeletal: He exhibits tenderness. He exhibits no edema.       Left hand: He exhibits decreased range of motion, tenderness and bony  tenderness. He exhibits normal two-point discrimination, normal capillary refill, no deformity, no laceration and no swelling. Normal sensation noted. Normal strength noted.       Hands: ttp of the left middle finger.  No edema, abrasions or bony deformity.  Nail is intact and w/o subungual hematoma.  Radial pulse brisk.  Distal sensation intact.  Left wrist is NT  Neurological: He is alert and oriented to person, place, and time. He exhibits normal muscle tone. Coordination normal.  Skin: Skin is warm and dry.    ED Course  Procedures (including critical care time) Labs Review Labs Reviewed - No data to display Imaging Review Dg Finger Middle Left  08/28/2013   CLINICAL DATA:  Trauma.  EXAM: LEFT MIDDLE FINGER 2+V  COMPARISON:  None.  FINDINGS: No fracture or dislocation.  IMPRESSION: No fracture or dislocation.   Electronically Signed   By: Bridgett Larsson M.D.   On: 08/28/2013 18:17    EKG Interpretation   None       MDM   1. Finger contusion, initial encounter     Pt has hx of frequent ED visits for various injuries.    Normal appearing left middle finger.  finger was splinted, pain improved.  Remains NV intact.  Pt agrees to elevate, ice and close f/u with orthopedics.  Appears stable for discharge.  Fae Blossom L. Kali Ambler, PA-C 08/29/13 0105

## 2013-08-31 NOTE — ED Provider Notes (Signed)
Medical screening examination/treatment/procedure(s) were performed by non-physician practitioner and as supervising physician I was immediately available for consultation/collaboration.  EKG Interpretation   None       Devoria Albe, MD, Armando Gang   Ward Givens, MD 08/31/13 1447

## 2013-09-14 ENCOUNTER — Encounter (HOSPITAL_COMMUNITY): Payer: Self-pay | Admitting: Emergency Medicine

## 2013-09-14 ENCOUNTER — Emergency Department (HOSPITAL_COMMUNITY)
Admission: EM | Admit: 2013-09-14 | Discharge: 2013-09-14 | Disposition: A | Discharge status: Home / Self Care | Payer: Medicaid Other | Attending: Emergency Medicine | Admitting: Emergency Medicine

## 2013-09-14 DIAGNOSIS — Z79899 Other long term (current) drug therapy: Secondary | ICD-10-CM | POA: Insufficient documentation

## 2013-09-14 DIAGNOSIS — Z792 Long term (current) use of antibiotics: Secondary | ICD-10-CM | POA: Insufficient documentation

## 2013-09-14 DIAGNOSIS — Z791 Long term (current) use of non-steroidal anti-inflammatories (NSAID): Secondary | ICD-10-CM | POA: Insufficient documentation

## 2013-09-14 DIAGNOSIS — Z880 Allergy status to penicillin: Secondary | ICD-10-CM | POA: Insufficient documentation

## 2013-09-14 DIAGNOSIS — IMO0002 Reserved for concepts with insufficient information to code with codable children: Secondary | ICD-10-CM | POA: Insufficient documentation

## 2013-09-14 DIAGNOSIS — F909 Attention-deficit hyperactivity disorder, unspecified type: Secondary | ICD-10-CM | POA: Insufficient documentation

## 2013-09-14 DIAGNOSIS — H9209 Otalgia, unspecified ear: Secondary | ICD-10-CM | POA: Insufficient documentation

## 2013-09-14 DIAGNOSIS — D759 Disease of blood and blood-forming organs, unspecified: Secondary | ICD-10-CM | POA: Insufficient documentation

## 2013-09-14 DIAGNOSIS — H9201 Otalgia, right ear: Secondary | ICD-10-CM

## 2013-09-14 MED ORDER — CLINDAMYCIN HCL 150 MG PO CAPS
150.0000 mg | ORAL_CAPSULE | Freq: Four times a day (QID) | ORAL | Status: DC
Start: 2013-09-14 — End: 2013-10-12

## 2013-09-14 NOTE — ED Notes (Addendum)
Patient walked to and from the scales without difficulty.  Brother of patient asking if patient can have medication stronger than tylenol - advised no by MD

## 2013-09-14 NOTE — ED Notes (Signed)
States he put a Q-tip into right ear too deep and injured his ear drum   - was trying to get the wax out of ear

## 2013-09-14 NOTE — ED Provider Notes (Signed)
CSN: [id number]     Arrival date & time 09/14/13  1740 History   First MD Initiated Contact with Patient 09/14/13 1747     Chief Complaint  Patient presents with  . Otalgia    Patient is a 22 y.o. male presenting with ear pain. The history is provided by the patient and a relative.  Otalgia Location:  Right Quality:  Aching Severity:  Mild Onset quality:  Gradual Timing:  Constant Progression:  Worsening Chronicity:  New Associated symptoms: hearing loss   Associated symptoms: no fever     Past Medical History  Diagnosis Date  . ADHD (attention deficit hyperactivity disorder)   . Back pain   . Shoulder pain    History reviewed. No pertinent past surgical history. Family History  Problem Relation Age of Onset  . Stroke Father   . Seizures Mother   . Seizures Brother    History  Substance Use Topics  . Smoking status: Never Smoker   . Smokeless tobacco: Never Used  . Alcohol Use: No    Review of Systems  Constitutional: Negative for fever.  HENT: Positive for ear pain and hearing loss.     Allergies  Codeine; Lactose intolerance (gi); Percocet; and Penicillins  Home Medications   Current Outpatient Rx  Name  Route  Sig  Dispense  Refill  . alprazolam (XANAX) 2 MG tablet   Oral   Take 2 mg by mouth 4 (four) times daily as needed. For anxiety         . fluticasone (FLONASE) 50 MCG/ACT nasal spray   Nasal   Place 2 sprays into the nose daily.           Marland Kitchen lithium carbonate 300 MG capsule   Oral   Take 300-600 mg by mouth 2 (two) times daily. Take one capsule (300mg ) every day in the morning, and take two capsules (600mg ) every day at bedtime         . propranolol (INNOPRAN XL) 80 MG 24 hr capsule   Oral   Take 80 mg by mouth daily.          . QUEtiapine (SEROQUEL) 300 MG tablet   Oral   Take 300 mg by mouth every evening.          . clindamycin (CLEOCIN) 150 MG capsule   Oral   Take 1 capsule (150 mg total) by mouth every 6 (six) hours.   28 capsule   0   . EPINEPHrine (EPI-PEN) 0.3 mg/0.3 mL SOAJ injection   Intramuscular   Inject 0.3 mLs (0.3 mg total) into the muscle once.   1 Device   2   . HYDROcodone-acetaminophen (NORCO/VICODIN) 5-325 MG per tablet      Take one tab po q 4-6 hrs prn pain   8 tablet   0   . hydrOXYzine (ATARAX/VISTARIL) 25 MG tablet   Oral   Take 2 tablets (50 mg total) by mouth at bedtime as needed for itching.   12 tablet   0   . ibuprofen (ADVIL,MOTRIN) 800 MG tablet   Oral   Take 1 tablet (800 mg total) by mouth 3 (three) times daily.   21 tablet   0   . promethazine (PHENERGAN) 25 MG tablet   Oral   Take 1 tablet (25 mg total) by mouth every 6 (six) hours as needed (Nausea, Vomiting, Diarrhea).   12 tablet   0   . ranitidine (ZANTAC) 150 MG capsule   Oral  Take 1 capsule (150 mg total) by mouth daily.   15 capsule   0    Ht 5\' 9"  (1.753 m)  Wt 138 lb (62.596 kg)  BMI 20.37 kg/m2 Physical Exam CONSTITUTIONAL: Well developed/well nourished HEAD AND FACE: Normocephalic/atraumatic EYES: EOMI/PERRL ENMT: Mucous membranes moist. Left TM intact.  Right TM obscured by cerumen, but ?small perforation.  No active bleeding.  No foreign body noted.  Ears symmetric.  No signs of trauma NECK: supple no meningeal signs CV: S1/S2 noted, no murmurs/rubs/gallops noted LUNGS: Lungs are clear to auscultation bilaterally, no apparent distress ABDOMEN: soft  NEURO: Pt is awake/alert, moves all extremitiesx4, gait without ataxia EXTREMITIES:full ROM SKIN: warm, color normal  ED Course  Procedures (including critical care time) Labs Review Labs Reviewed - No data to display Imaging Review No results found.  EKG Interpretation   None       MDM   1. Otalgia, right    Nursing notes including past medical history and social history reviewed and considered in documentation  Pt will be given oral abx and advised to avoid placing objects in ear.  Also advised to avoid water in  his ear  Pt is in no distress  Brother at bedside asks for something more than tylenol.  He reports his pain was so bad "he had to be carried"  Pt ambulated without difficulty in the ER   I advised need for f/u with ENT   Joya Gaskins, MD 09/14/13 [phone]

## 2013-09-19 ENCOUNTER — Emergency Department (HOSPITAL_COMMUNITY)
Admission: EM | Admit: 2013-09-19 | Discharge: 2013-09-19 | Disposition: A | Discharge status: Home / Self Care | Payer: Medicaid Other | Attending: Emergency Medicine | Admitting: Emergency Medicine

## 2013-09-19 ENCOUNTER — Encounter (HOSPITAL_COMMUNITY): Payer: Self-pay | Admitting: Emergency Medicine

## 2013-09-19 ENCOUNTER — Emergency Department (HOSPITAL_COMMUNITY): Payer: Medicaid Other

## 2013-09-19 DIAGNOSIS — Z79899 Other long term (current) drug therapy: Secondary | ICD-10-CM | POA: Insufficient documentation

## 2013-09-19 DIAGNOSIS — IMO0002 Reserved for concepts with insufficient information to code with codable children: Secondary | ICD-10-CM | POA: Insufficient documentation

## 2013-09-19 DIAGNOSIS — W1809XA Striking against other object with subsequent fall, initial encounter: Secondary | ICD-10-CM | POA: Insufficient documentation

## 2013-09-19 DIAGNOSIS — Z880 Allergy status to penicillin: Secondary | ICD-10-CM | POA: Insufficient documentation

## 2013-09-19 DIAGNOSIS — W1789XA Other fall from one level to another, initial encounter: Secondary | ICD-10-CM | POA: Insufficient documentation

## 2013-09-19 DIAGNOSIS — W19XXXA Unspecified fall, initial encounter: Secondary | ICD-10-CM

## 2013-09-19 DIAGNOSIS — Z792 Long term (current) use of antibiotics: Secondary | ICD-10-CM | POA: Insufficient documentation

## 2013-09-19 DIAGNOSIS — Y939 Activity, unspecified: Secondary | ICD-10-CM | POA: Insufficient documentation

## 2013-09-19 DIAGNOSIS — Y929 Unspecified place or not applicable: Secondary | ICD-10-CM | POA: Insufficient documentation

## 2013-09-19 DIAGNOSIS — S0993XA Unspecified injury of face, initial encounter: Secondary | ICD-10-CM | POA: Insufficient documentation

## 2013-09-19 DIAGNOSIS — Z8659 Personal history of other mental and behavioral disorders: Secondary | ICD-10-CM | POA: Insufficient documentation

## 2013-09-19 LAB — CBC WITH DIFFERENTIAL/PLATELET
Basophils Absolute: 0 10*3/uL (ref 0.0–0.1)
Basophils Relative: 0 % (ref 0–1)
Eosinophils Absolute: 0.8 10*3/uL — ABNORMAL HIGH (ref 0.0–0.7)
HCT: 37.9 % — ABNORMAL LOW (ref 39.0–52.0)
Hemoglobin: 12.2 g/dL — ABNORMAL LOW (ref 13.0–17.0)
Lymphocytes Relative: 14 % (ref 12–46)
MCH: 20.5 pg — ABNORMAL LOW (ref 26.0–34.0)
MCHC: 32.2 g/dL (ref 30.0–36.0)
Monocytes Absolute: 0.7 10*3/uL (ref 0.1–1.0)
Neutro Abs: 10.1 10*3/uL — ABNORMAL HIGH (ref 1.7–7.7)
Neutrophils Relative %: 75 % (ref 43–77)
Platelets: ADEQUATE 10*3/uL (ref 150–400)
RDW: 14.8 % (ref 11.5–15.5)

## 2013-09-19 LAB — BASIC METABOLIC PANEL
CO2: 27 mEq/L (ref 19–32)
Calcium: 10.8 mg/dL — ABNORMAL HIGH (ref 8.4–10.5)
Creatinine, Ser: 0.91 mg/dL (ref 0.50–1.35)
Glucose, Bld: 102 mg/dL — ABNORMAL HIGH (ref 70–99)
Potassium: 3.7 mEq/L (ref 3.5–5.1)
Sodium: 140 mEq/L (ref 135–145)

## 2013-09-19 MED ORDER — IOHEXOL 300 MG/ML  SOLN
100.0000 mL | Freq: Once | INTRAMUSCULAR | Status: AC | PRN
  Administered 2013-09-19: 100 mL via INTRAVENOUS

## 2013-09-19 MED ORDER — HYDROCODONE-ACETAMINOPHEN 5-325 MG PO TABS
1.0000 | ORAL_TABLET | Freq: Once | ORAL | Status: AC
  Administered 2013-09-19: 1 via ORAL
  Filled 2013-09-19: qty 1

## 2013-09-19 MED ORDER — HYDROCODONE-ACETAMINOPHEN 5-325 MG PO TABS: 1.0000 | ORAL_TABLET | Freq: Four times a day (QID) | ORAL | Status: DC | PRN

## 2013-09-19 NOTE — ED Notes (Signed)
In to discharge patient,  Patient is ambulatory and does not appear to be in any acute distress, skin warm and dry, color wnl, vital signs stable.  Patients brother asking for patient to have something for pain tonight prior to discharge.  MD advised

## 2013-09-19 NOTE — ED Notes (Signed)
Reports fell out of deer stand approx 13 ft today around 1700.  Reports landed on back.  States hit head on ground and lost consciousness.  C/o lower back pain and posterior upper arm pain.  No obvious bruising/abrasions/deformities noted to head or back.  Pt alert and oriented x 4 at this time.  c-collar applied in triage.

## 2013-09-19 NOTE — ED Provider Notes (Signed)
CSN: [id number]     Arrival date & time 09/19/13  1759 History   First MD Initiated Contact with Patient 09/19/13 1957     Chief Complaint  Patient presents with  . Fall   (Consider location/radiation/quality/duration/timing/severity/associated sxs/prior Treatment) Patient is a 22 y.o. male presenting with fall. The history is provided by the patient (the pt complains of falling 13 feet out of a tree on his back.  on the ground).  Fall This is a new problem. The current episode started 12 to 24 hours ago. The problem occurs constantly. The problem has not changed since onset.Pertinent negatives include no chest pain, no abdominal pain and no headaches. Nothing aggravates the symptoms. Nothing relieves the symptoms.    Past Medical History  Diagnosis Date  . ADHD (attention deficit hyperactivity disorder)   . Back pain   . Shoulder pain    History reviewed. No pertinent past surgical history. Family History  Problem Relation Age of Onset  . Stroke Father   . Seizures Mother   . Seizures Brother    History  Substance Use Topics  . Smoking status: Never Smoker   . Smokeless tobacco: Never Used  . Alcohol Use: No    Review of Systems  Constitutional: Negative for appetite change and fatigue.  HENT: Negative for congestion, ear discharge and sinus pressure.   Eyes: Negative for discharge.  Respiratory: Negative for cough.   Cardiovascular: Negative for chest pain.  Gastrointestinal: Negative for abdominal pain and diarrhea.  Genitourinary: Negative for frequency and hematuria.  Musculoskeletal: Positive for back pain and neck pain.  Skin: Negative for rash.  Neurological: Negative for seizures and headaches.  Psychiatric/Behavioral: Negative for hallucinations.    Allergies  Codeine; Lactose intolerance (gi); Percocet; and Penicillins  Home Medications   Current Outpatient Rx  Name  Route  Sig  Dispense  Refill  . alprazolam (XANAX) 2 MG tablet   Oral   Take 2 mg by  mouth 4 (four) times daily as needed. For anxiety         . clindamycin (CLEOCIN) 150 MG capsule   Oral   Take 1 capsule (150 mg total) by mouth every 6 (six) hours.   28 capsule   0   . fluticasone (FLONASE) 50 MCG/ACT nasal spray   Nasal   Place 2 sprays into the nose daily.           . hydrOXYzine (ATARAX/VISTARIL) 25 MG tablet   Oral   Take 2 tablets (50 mg total) by mouth at bedtime as needed for itching.   12 tablet   0   . lithium carbonate 300 MG capsule   Oral   Take 300-600 mg by mouth 2 (two) times daily. Take one capsule (300mg ) every day in the morning, and take two capsules (600mg ) every day at bedtime         . promethazine (PHENERGAN) 25 MG tablet   Oral   Take 1 tablet (25 mg total) by mouth every 6 (six) hours as needed (Nausea, Vomiting, Diarrhea).   12 tablet   0   . propranolol (INNOPRAN XL) 80 MG 24 hr capsule   Oral   Take 80 mg by mouth daily.          . QUEtiapine (SEROQUEL) 300 MG tablet   Oral   Take 300 mg by mouth every evening.          . ranitidine (ZANTAC) 150 MG capsule   Oral   Take  1 capsule (150 mg total) by mouth daily.   15 capsule   0   . EPINEPHrine (EPI-PEN) 0.3 mg/0.3 mL SOAJ injection   Intramuscular   Inject 0.3 mLs (0.3 mg total) into the muscle once.   1 Device   2   . HYDROcodone-acetaminophen (NORCO/VICODIN) 5-325 MG per tablet   Oral   Take 1 tablet by mouth every 6 (six) hours as needed for moderate pain.   12 tablet   0   . ibuprofen (ADVIL,MOTRIN) 800 MG tablet   Oral   Take 1 tablet (800 mg total) by mouth 3 (three) times daily.   21 tablet   0    BP 112/54  Pulse 68  Temp(Src) 98.7 F (37.1 C) (Oral)  Resp 16  Ht 5\' 8"  (1.727 m)  Wt 127 lb (57.607 kg)  BMI 19.31 kg/m2  SpO2 100% Physical Exam  Constitutional: He is oriented to person, place, and time. He appears well-developed.  HENT:  Head: Normocephalic.  Eyes: Conjunctivae and EOM are normal. No scleral icterus.  Neck: Neck  supple. No thyromegaly present.  Mild tenderness post.  neck  Cardiovascular: Normal rate and regular rhythm.  Exam reveals no gallop and no friction rub.   No murmur heard. Pulmonary/Chest: No stridor. He has no wheezes. He has no rales. He exhibits no tenderness.  Abdominal: He exhibits no distension. There is no tenderness. There is no rebound.  Musculoskeletal: He exhibits no edema.  Mild tenderness lumbar spine  Lymphadenopathy:    He has no cervical adenopathy.  Neurological: He is oriented to person, place, and time. He exhibits normal muscle tone. Coordination normal.  Skin: No rash noted. No erythema.  Psychiatric: He has a normal mood and affect. His behavior is normal.    ED Course  Procedures (including critical care time) Labs Review Labs Reviewed  CBC WITH DIFFERENTIAL - Abnormal; Notable for the following:    WBC 13.5 (*)    RBC 5.95 (*)    Hemoglobin 12.2 (*)    HCT 37.9 (*)    MCV 63.7 (*)    MCH 20.5 (*)    Eosinophils Relative 6 (*)    Neutro Abs 10.1 (*)    Eosinophils Absolute 0.8 (*)    All other components within normal limits  BASIC METABOLIC PANEL - Abnormal; Notable for the following:    Glucose, Bld 102 (*)    Calcium 10.8 (*)    All other components within normal limits   Imaging Review Ct Head Wo Contrast  09/19/2013   CLINICAL DATA:  Fall from tree stand with loss of consciousness and neck pain.  EXAM: CT HEAD WITHOUT CONTRAST  CT CERVICAL SPINE WITHOUT CONTRAST  TECHNIQUE: Multidetector CT imaging of the head and cervical spine was performed following the standard protocol without intravenous contrast. Multiplanar CT image reconstructions of the cervical spine were also generated.  COMPARISON:  CT of the head on 04/30/2012.  FINDINGS: CT HEAD FINDINGS  The brain demonstrates no evidence of hemorrhage, infarction, edema, mass effect, extra-axial fluid collection, hydrocephalus or mass lesion. The skull is unremarkable. Sinus disease identified with  opacification of bilateral ethmoid air cells left frontal air cells. Mucosal thickening is also identified in the sphenoid air cells.  CT CERVICAL SPINE FINDINGS  The cervical spine shows normal alignment. There is no evidence of acute fracture or subluxation. No soft tissue swelling or hematoma is identified. There are no significant degenerative changes. No bony or soft tissue lesions are seen.  The visualized airway is normally patent.  IMPRESSION: No acute injury identified. Sinus disease identified on the head CT.   Electronically Signed   By: Irish Lack M.D.   On: 09/19/2013 21:01   Ct Chest W Contrast  09/19/2013   CLINICAL DATA:  Larey Seat from tree stand. Landed on back appear loss consciousness. Neck pain.  EXAM: CT CHEST, ABDOMEN, AND PELVIS WITH CONTRAST  TECHNIQUE: Multidetector CT imaging of the chest, abdomen and pelvis was performed following the standard protocol during bolus administration of intravenous contrast.  CONTRAST:  OMNIPAQUE IOHEXOL 300 MG/ML  SOLN  COMPARISON:  CT of the thoracic spine on 10/25/2011  FINDINGS: CT CHEST FINDINGS  The heart and mediastinum are normal in appearance. Lungs are clear without consolidations, pleural effusions. No pneumothorax. No evidence for acute fracture.  CT ABDOMEN AND PELVIS FINDINGS  No focal abnormality identified within the liver, spleen, pancreas, adrenal glands, or kidneys. The gallbladder is present.  The stomach, small bowel loops, and colon have a normal appearance. The appendix is well seen and has a normal appearance. There is minimal stranding in the region of the right pericolic gutter without specific bowel abnormality. Additionally, there is a small amount of free pelvic fluid. In the setting of trauma and a male patient, this raises the question of small bowel injury.  No evidence for aortic aneurysm. No retroperitoneal or mesenteric adenopathy. Visualized osseous structures have a normal appearance.  IMPRESSION: 1. No evidence  for acute abnormality of the chest. 2. No evidence for solid organ injury. 3. Small amount of free pelvic fluid and right pericolic stranding raises a question of bowel injury.   Electronically Signed   By: Rosalie Gums M.D.   On: 09/19/2013 21:13   Ct Cervical Spine Wo Contrast  09/19/2013   CLINICAL DATA:  Fall from tree stand with loss of consciousness and neck pain.  EXAM: CT HEAD WITHOUT CONTRAST  CT CERVICAL SPINE WITHOUT CONTRAST  TECHNIQUE: Multidetector CT imaging of the head and cervical spine was performed following the standard protocol without intravenous contrast. Multiplanar CT image reconstructions of the cervical spine were also generated.  COMPARISON:  CT of the head on 04/30/2012.  FINDINGS: CT HEAD FINDINGS  The brain demonstrates no evidence of hemorrhage, infarction, edema, mass effect, extra-axial fluid collection, hydrocephalus or mass lesion. The skull is unremarkable. Sinus disease identified with opacification of bilateral ethmoid air cells left frontal air cells. Mucosal thickening is also identified in the sphenoid air cells.  CT CERVICAL SPINE FINDINGS  The cervical spine shows normal alignment. There is no evidence of acute fracture or subluxation. No soft tissue swelling or hematoma is identified. There are no significant degenerative changes. No bony or soft tissue lesions are seen. The visualized airway is normally patent.  IMPRESSION: No acute injury identified. Sinus disease identified on the head CT.   Electronically Signed   By: Irish Lack M.D.   On: 09/19/2013 21:01   Ct Abdomen Pelvis W Contrast  09/19/2013   CLINICAL DATA:  Larey Seat from tree stand. Landed on back appear loss consciousness. Neck pain.  EXAM: CT CHEST, ABDOMEN, AND PELVIS WITH CONTRAST  TECHNIQUE: Multidetector CT imaging of the chest, abdomen and pelvis was performed following the standard protocol during bolus administration of intravenous contrast.  CONTRAST:  OMNIPAQUE IOHEXOL 300 MG/ML  SOLN   COMPARISON:  CT of the thoracic spine on 10/25/2011  FINDINGS: CT CHEST FINDINGS  The heart and mediastinum are normal in appearance. Lungs  are clear without consolidations, pleural effusions. No pneumothorax. No evidence for acute fracture.  CT ABDOMEN AND PELVIS FINDINGS  No focal abnormality identified within the liver, spleen, pancreas, adrenal glands, or kidneys. The gallbladder is present.  The stomach, small bowel loops, and colon have a normal appearance. The appendix is well seen and has a normal appearance. There is minimal stranding in the region of the right pericolic gutter without specific bowel abnormality. Additionally, there is a small amount of free pelvic fluid. In the setting of trauma and a male patient, this raises the question of small bowel injury.  No evidence for aortic aneurysm. No retroperitoneal or mesenteric adenopathy. Visualized osseous structures have a normal appearance.  IMPRESSION: 1. No evidence for acute abnormality of the chest. 2. No evidence for solid organ injury. 3. Small amount of free pelvic fluid and right pericolic stranding raises a question of bowel injury.   Electronically Signed   By: Rosalie Gums M.D.   On: 09/19/2013 21:13    EKG Interpretation   None       MDM  Dr Lovell Sheehan called and will follow up in 2 days 1. Fall, initial encounter        Benny Lennert, MD 09/19/13 2134

## 2013-09-19 NOTE — ED Notes (Signed)
Dr Estell Harpin to room to talk to patient and brother

## 2013-09-23 ENCOUNTER — Emergency Department (HOSPITAL_COMMUNITY)
Admission: EM | Admit: 2013-09-23 | Discharge: 2013-09-23 | Disposition: A | Discharge status: Home / Self Care | Payer: Medicaid Other | Attending: Emergency Medicine | Admitting: Emergency Medicine

## 2013-09-23 ENCOUNTER — Encounter (HOSPITAL_COMMUNITY): Payer: Self-pay | Admitting: Emergency Medicine

## 2013-09-23 DIAGNOSIS — R1012 Left upper quadrant pain: Secondary | ICD-10-CM | POA: Insufficient documentation

## 2013-09-23 DIAGNOSIS — R1011 Right upper quadrant pain: Secondary | ICD-10-CM | POA: Insufficient documentation

## 2013-09-23 DIAGNOSIS — Z880 Allergy status to penicillin: Secondary | ICD-10-CM | POA: Insufficient documentation

## 2013-09-23 DIAGNOSIS — IMO0002 Reserved for concepts with insufficient information to code with codable children: Secondary | ICD-10-CM | POA: Insufficient documentation

## 2013-09-23 DIAGNOSIS — G8911 Acute pain due to trauma: Secondary | ICD-10-CM | POA: Insufficient documentation

## 2013-09-23 DIAGNOSIS — R109 Unspecified abdominal pain: Secondary | ICD-10-CM

## 2013-09-23 DIAGNOSIS — Z79899 Other long term (current) drug therapy: Secondary | ICD-10-CM | POA: Insufficient documentation

## 2013-09-23 DIAGNOSIS — Z8659 Personal history of other mental and behavioral disorders: Secondary | ICD-10-CM | POA: Insufficient documentation

## 2013-09-23 LAB — CBC WITH DIFFERENTIAL/PLATELET
Basophils Absolute: 0 10*3/uL (ref 0.0–0.1)
Basophils Relative: 0 % (ref 0–1)
Eosinophils Absolute: 0.6 10*3/uL (ref 0.0–0.7)
Eosinophils Relative: 7 % — ABNORMAL HIGH (ref 0–5)
HCT: 35.2 % — ABNORMAL LOW (ref 39.0–52.0)
Lymphocytes Relative: 20 % (ref 12–46)
MCH: 20.5 pg — ABNORMAL LOW (ref 26.0–34.0)
MCHC: 31.8 g/dL (ref 30.0–36.0)
MCV: 64.5 fL — ABNORMAL LOW (ref 78.0–100.0)
Monocytes Absolute: 0.4 10*3/uL (ref 0.1–1.0)
Monocytes Relative: 5 % (ref 3–12)
Platelets: 195 10*3/uL (ref 150–400)
RBC: 5.46 MIL/uL (ref 4.22–5.81)
RDW: 14.9 % (ref 11.5–15.5)

## 2013-09-23 LAB — COMPREHENSIVE METABOLIC PANEL
ALT: 16 U/L (ref 0–53)
AST: 18 U/L (ref 0–37)
CO2: 29 mEq/L (ref 19–32)
Calcium: 9.9 mg/dL (ref 8.4–10.5)
Creatinine, Ser: 0.9 mg/dL (ref 0.50–1.35)
GFR calc Af Amer: >90 mL/min (ref >90)
Glucose, Bld: 102 mg/dL — ABNORMAL HIGH (ref 70–99)
Sodium: 142 mEq/L (ref 135–145)
Total Protein: 7.4 g/dL (ref 6.0–8.3)

## 2013-09-23 MED ORDER — NAPROXEN 250 MG PO TABS
500.0000 mg | ORAL_TABLET | Freq: Once | ORAL | Status: AC
  Administered 2013-09-23: 500 mg via ORAL
  Filled 2013-09-23: qty 2

## 2013-09-23 MED ORDER — FAMOTIDINE 20 MG PO TABS: 20.0000 mg | ORAL_TABLET | Freq: Two times a day (BID) | ORAL | Status: DC

## 2013-09-23 NOTE — ED Notes (Signed)
Pt c/o abd pain that is described as "air" with being lightheaded that started this evening,

## 2013-09-23 NOTE — ED Provider Notes (Signed)
CSN: [id number]     Arrival date & time 09/23/13  1750 History  This chart was scribed for Eugene Roller, MD by Quintella Reichert, ED scribe.  This patient was seen in room APA12/APA12 and the patient's care was started at 6:10 PM.   Chief Complaint  Patient presents with  . Abdominal Pain    The history is provided by the patient. No language interpreter was used.    HPI Comments: Eugene Hicks is a 22 y.o. male who presents to the Emergency Department complaining of sudden-onset supraumbilical abdominal pain that began one hour.  Pt states that 4 days ago he was climbing a deer stand and fell 15 feet onto his stomach.  He was seen by his PCP on that day and CT abdomen showed no internal injuries but a small amount of free fluid in the pelvis.  He was otherwise doing well and had no significant abdominal pain until one hour ago when he was at the [facility] and suddenly developed a burning pain in his supraumbilical abdomen.  Pain has persisted since then and has not improved.  Pt denies difficulty walking, headache, or leg pain.  He does not drink or smoke.  He denies illicit drug use.    PCP: Evelene Croon, MD   Past Medical History  Diagnosis Date  . ADHD (attention deficit hyperactivity disorder)   . Back pain   . Shoulder pain     History reviewed. No pertinent past surgical history.   Family History  Problem Relation Age of Onset  . Stroke Father   . Seizures Mother   . Seizures Brother     History  Substance Use Topics  . Smoking status: Never Smoker   . Smokeless tobacco: Never Used  . Alcohol Use: No     Review of Systems  All other systems reviewed and are negative.     Allergies  Codeine; Lactose intolerance (gi); Percocet; and Penicillins  Home Medications   Current Outpatient Rx  Name  Route  Sig  Dispense  Refill  . alprazolam (XANAX) 2 MG tablet   Oral   Take 2 mg by mouth 4 (four) times daily as needed. For anxiety         .  clindamycin (CLEOCIN) 150 MG capsule   Oral   Take 1 capsule (150 mg total) by mouth every 6 (six) hours.   28 capsule   0   . fluticasone (FLONASE) 50 MCG/ACT nasal spray   Nasal   Place 2 sprays into the nose daily.           Marland Kitchen HYDROcodone-acetaminophen (NORCO/VICODIN) 5-325 MG per tablet   Oral   Take 1 tablet by mouth every 6 (six) hours as needed for moderate pain.   12 tablet   0   . hydrOXYzine (ATARAX/VISTARIL) 25 MG tablet   Oral   Take 2 tablets (50 mg total) by mouth at bedtime as needed for itching.   12 tablet   0   . ibuprofen (ADVIL,MOTRIN) 800 MG tablet   Oral   Take 1 tablet (800 mg total) by mouth 3 (three) times daily.   21 tablet   0   . lithium carbonate 300 MG capsule   Oral   Take 300-600 mg by mouth 2 (two) times daily. Take one capsule (300mg ) every day in the morning, and take two capsules (600mg ) every day at bedtime         . propranolol (INNOPRAN XL)  80 MG 24 hr capsule   Oral   Take 80 mg by mouth daily.          . QUEtiapine (SEROQUEL) 300 MG tablet   Oral   Take 300 mg by mouth every evening.          . ranitidine (ZANTAC) 150 MG capsule   Oral   Take 1 capsule (150 mg total) by mouth daily.   15 capsule   0   . EPINEPHrine (EPI-PEN) 0.3 mg/0.3 mL SOAJ injection   Intramuscular   Inject 0.3 mLs (0.3 mg total) into the muscle once.   1 Device   2   . famotidine (PEPCID) 20 MG tablet   Oral   Take 1 tablet (20 mg total) by mouth 2 (two) times daily.   30 tablet   0    BP 126/72  Pulse 82  Temp(Src) 97.3 F (36.3 C) (Oral)  Resp 16  SpO2 98%  Physical Exam  Nursing note and vitals reviewed. Constitutional: He is oriented to person, place, and time. He appears well-developed and well-nourished. No distress.  HENT:  Head: Normocephalic and atraumatic.  Mouth/Throat: Oropharynx is clear and moist.  Eyes: Conjunctivae are normal. Pupils are equal, round, and reactive to light. No scleral icterus.  Neck: Neck  supple.  Cardiovascular: Normal rate, regular rhythm, normal heart sounds and intact distal pulses.   No murmur heard. Pulmonary/Chest: Effort normal and breath sounds normal. No stridor. No respiratory distress. He has no wheezes. He has no rales.  Abdominal: Soft. He exhibits no distension. There is tenderness (supraumbilical, RUQ and LUQ). There is no guarding.  No tenderness to RLQ No peritoneal signs  Musculoskeletal: Normal range of motion. He exhibits no edema.  Neurological: He is alert and oriented to person, place, and time.  Skin: Skin is warm and dry. No rash noted.  Psychiatric: He has a normal mood and affect. His behavior is normal.    ED Course  Procedures (including critical care time)  DIAGNOSTIC STUDIES: Oxygen Saturation is 98% on room air, normal by my interpretation.    COORDINATION OF CARE: 6:16 PM-Discussed treatment plan which includes labs and Naprosyn with pt at bedside and pt agreed to plan.    Labs Review Labs Reviewed  CBC WITH DIFFERENTIAL - Abnormal; Notable for the following:    Hemoglobin 11.2 (*)    HCT 35.2 (*)    MCV 64.5 (*)    MCH 20.5 (*)    Eosinophils Relative 7 (*)    All other components within normal limits  COMPREHENSIVE METABOLIC PANEL - Abnormal; Notable for the following:    Glucose, Bld 102 (*)    All other components within normal limits  LIPASE, BLOOD    Imaging Review Ct Abdomen Pelvis W Contrast  09/24/2013   CLINICAL DATA:  Abdominal pain post fall five days ago  EXAM: CT ABDOMEN AND PELVIS WITH CONTRAST  TECHNIQUE: Multidetector CT imaging of the abdomen and pelvis was performed using the standard protocol following bolus administration of intravenous contrast.  CONTRAST:  OMNIPAQUE IOHEXOL 300 MG/ML SOLN, 50mL OMNIPAQUE IOHEXOL 300 MG/ML SOLN  COMPARISON:  09/19/2013  FINDINGS: Sagittal images of the spine shows no acute fractures. No destructive bony lesions are noted. There is small atelectasis or infiltrate in  left lower lobe posteriorly.  In his hands liver is unremarkable. No evidence of liver laceration. No calcified gallstones are noted within gallbladder. Mild splenomegaly again noted. The spleen measures about 13 cm in  length. The pancreas and adrenal glands are unremarkable. There is no evidence of perisplenic fluid. No splenic laceration.  Kidneys are symmetrical in size and enhancement. No hydronephrosis or hydroureter. No aortic aneurysm.  Moderate stool noted in distal colon. The urinary bladder is unremarkable. No evidence of urinary bladder injury.  No small bowel obstruction.  No ascites or free air.  No adenopathy.  There is no pericecal inflammation. The terminal ileum is unremarkable. Normal appendix partially visualized axial image 40. Mild congested vessels are noted in left inguinal scrotal canal.  IMPRESSION: 1. No acute visceral injury within abdomen or pelvis. Small area of focal atelectasis or infiltrate in left lower lobe posteriorly. 2. Mild splenomegaly with spleen measuring 13 cm in length. No splenic laceration. 3. No hydronephrosis or hydroureter. Mild congested vessels in left inguinal scrotal canal. 4. Normal appendix.   Electronically Signed   By: Natasha Mead M.D.   On: 09/24/2013 18:11     EKG Interpretation   None       MDM   1. Abdominal pain    Labs normal, pt without any peritoneal signs, VS normal, labs normal, stable for d/c.  Doubt pathologic process.  Meds given in ED:  Medications  naproxen (NAPROSYN) tablet 500 mg (500 mg Oral Given 09/23/13 1822)    Discharge Medication List as of 09/23/2013  7:43 PM    START taking these medications   Details  famotidine (PEPCID) 20 MG tablet Take 1 tablet (20 mg total) by mouth 2 (two) times daily., Starting 09/23/2013, Until Discontinued, Print          I personally performed the services described in this documentation, which was scribed in my presence. The recorded information has been reviewed and is  accurate.      Eugene Roller, MD 09/26/13 [phone]

## 2013-09-24 ENCOUNTER — Emergency Department (HOSPITAL_COMMUNITY): Payer: Medicaid Other

## 2013-09-24 ENCOUNTER — Encounter (HOSPITAL_COMMUNITY): Payer: Self-pay | Admitting: Emergency Medicine

## 2013-09-24 ENCOUNTER — Emergency Department (HOSPITAL_COMMUNITY)
Admission: EM | Admit: 2013-09-24 | Discharge: 2013-09-24 | Disposition: A | Discharge status: Home / Self Care | Payer: Medicaid Other | Attending: Emergency Medicine | Admitting: Emergency Medicine

## 2013-09-24 DIAGNOSIS — R109 Unspecified abdominal pain: Secondary | ICD-10-CM | POA: Insufficient documentation

## 2013-09-24 DIAGNOSIS — Z79899 Other long term (current) drug therapy: Secondary | ICD-10-CM | POA: Insufficient documentation

## 2013-09-24 DIAGNOSIS — Z8659 Personal history of other mental and behavioral disorders: Secondary | ICD-10-CM | POA: Insufficient documentation

## 2013-09-24 LAB — COMPREHENSIVE METABOLIC PANEL
AST: 19 U/L (ref 0–37)
Albumin: 4.5 g/dL (ref 3.5–5.2)
BUN: 6 mg/dL (ref 6–23)
Calcium: 10 mg/dL (ref 8.4–10.5)
Chloride: 105 mEq/L (ref 96–112)
Creatinine, Ser: 0.88 mg/dL (ref 0.50–1.35)
GFR calc Af Amer: >90 mL/min (ref >90)
GFR calc non Af Amer: >90 mL/min (ref >90)
Glucose, Bld: 99 mg/dL (ref 70–99)
Potassium: 3.8 mEq/L (ref 3.5–5.1)
Total Bilirubin: 0.7 mg/dL (ref 0.3–1.2)

## 2013-09-24 LAB — CBC WITH DIFFERENTIAL/PLATELET
Eosinophils Absolute: 0.6 10*3/uL (ref 0.0–0.7)
Lymphs Abs: 1.4 10*3/uL (ref 0.7–4.0)
MCH: 20.4 pg — ABNORMAL LOW (ref 26.0–34.0)
MCHC: 31.5 g/dL (ref 30.0–36.0)
Monocytes Absolute: 0.5 10*3/uL (ref 0.1–1.0)
Monocytes Relative: 5 % (ref 3–12)
Neutrophils Relative %: 74 % (ref 43–77)
Platelets: 168 10*3/uL (ref 150–400)
RDW: 14.8 % (ref 11.5–15.5)

## 2013-09-24 LAB — LIPASE, BLOOD: Lipase: 16 U/L (ref 11–59)

## 2013-09-24 MED ORDER — MORPHINE SULFATE 4 MG/ML IJ SOLN
4.0000 mg | INTRAMUSCULAR | Status: DC | PRN
  Administered 2013-09-24: 4 mg via INTRAVENOUS
  Filled 2013-09-24: qty 1

## 2013-09-24 MED ORDER — IOHEXOL 300 MG/ML  SOLN
50.0000 mL | Freq: Once | INTRAMUSCULAR | Status: AC | PRN
  Administered 2013-09-24: 50 mL via ORAL

## 2013-09-24 MED ORDER — IOHEXOL 300 MG/ML  SOLN
100.0000 mL | Freq: Once | INTRAMUSCULAR | Status: AC | PRN
  Administered 2013-09-24: 100 mL via INTRAVENOUS

## 2013-09-24 MED ORDER — SODIUM CHLORIDE 0.9 % IJ SOLN
INTRAMUSCULAR | Status: AC
  Administered 2013-09-24: 19:00:00
  Filled 2013-09-24: qty 250

## 2013-09-24 MED ORDER — ONDANSETRON HCL 4 MG/2ML IJ SOLN
4.0000 mg | Freq: Once | INTRAMUSCULAR | Status: AC
  Administered 2013-09-24: 4 mg via INTRAVENOUS
  Filled 2013-09-24: qty 2

## 2013-09-24 NOTE — ED Notes (Addendum)
Pt c/o abd pain, father at bedside upset with being in er, states "I keep getting accused of wanting pain medication", asked pt and father if pt had followed up with Dr. Lovell Sheehan, father states that pt can not be seen by Dr. York Ram until September 28, 2013 but he is not sure why he would have to call his wife to find out. Pt had told triage nurse that his abd pain started while he was jogging today, when I asked pt when the pain started, father states " pt was at home watching tv". Asked pt about the difference in stories and pt states " I did jog earlier" pt denies any n/v, alert able to answer all questions himself but father tends to answer questions for pt.

## 2013-09-24 NOTE — ED Notes (Signed)
Father wanting to know when the doctor was coming to check his son ( pt has been in room 16 min ). States this is the third visit here and nothing has been done for him. States all they gave him last night was pepsid

## 2013-09-24 NOTE — ED Notes (Signed)
Father states that he has spoken with his wife who had called Dr. Lovell Sheehan office and was told that his office was "booked" and would not be able to see pt until September 28, 2013.

## 2013-09-24 NOTE — ED Notes (Signed)
Pt has finished drinking both cups of contrast, ct notified,

## 2013-09-24 NOTE — ED Notes (Signed)
Dr James at bedside,  

## 2013-09-24 NOTE — ED Notes (Signed)
Pt states "I was jogging down the street today and a pain hit me (to center abdomen) like a blow." Pt told EMS  that he fell from a tree stand 4 days ago. Pt did not tell me this until I questioned him about it.

## 2013-09-24 NOTE — ED Provider Notes (Signed)
CSN: [id number]     Arrival date & time 09/24/13  1502 History  This chart was scribed for Roney Marion, MD by Joaquin Music, ED Scribe. This patient was seen in room APA01/APA01 and the patient's care was started at  3:49 PM  Chief Complaint  Patient presents with  . Abdominal Pain   The history is provided by the patient and a parent. No language interpreter was used.   HPI Comments: Eugene Hicks is a 22 y.o. male who presents to the Emergency Department complaining of ongoing worsening supraumbilical pain that began 4 days ago. Pt states he was jogging 3 hours ago and began having excruciating abd pain.Pt states he is feeling like he has had  "a blow of pain to the center of his supraumbilical region".  Pt denies having pain prior to when he started jogging. Father states pt had an accident 4 days ago that involved him falling out of a tree. He states pt was seen at [facility] and was prescribed pepsid. Father states pt had labs and X-Rays done. He states they found fluid around his abd. Father states pt has been taking Vicodin throughout the week with some relief. Father states pt was crying PTA due to excruciating pain. Pt states he has has normal BM and bladder content. Pt denies emesis and fever.  Past Medical History  Diagnosis Date  . ADHD (attention deficit hyperactivity disorder)   . Back pain   . Shoulder pain    History reviewed. No pertinent past surgical history. Family History  Problem Relation Age of Onset  . Stroke Father   . Seizures Mother   . Seizures Brother    History  Substance Use Topics  . Smoking status: Never Smoker   . Smokeless tobacco: Never Used  . Alcohol Use: No    Review of Systems  Constitutional: Negative for diaphoresis and appetite change.  HENT: Negative for mouth sores and trouble swallowing.   Eyes: Negative for visual disturbance.  Respiratory: Negative for cough, chest tightness and wheezing.   Gastrointestinal:  Positive for abdominal pain. Negative for vomiting, diarrhea, blood in stool, abdominal distention and rectal pain.  Endocrine: Negative for polydipsia, polyphagia and polyuria.  Genitourinary: Negative for frequency.  Musculoskeletal: Negative for gait problem.  Skin: Negative for color change, pallor and rash.  Neurological: Negative for dizziness, syncope, light-headedness and headaches.  Hematological: Does not bruise/bleed easily.  Psychiatric/Behavioral: Negative for behavioral problems and confusion.    Allergies  Codeine; Lactose intolerance (gi); Percocet; and Penicillins  Home Medications   Current Outpatient Rx  Name  Route  Sig  Dispense  Refill  . alprazolam (XANAX) 2 MG tablet   Oral   Take 2 mg by mouth 4 (four) times daily as needed. For anxiety         . clindamycin (CLEOCIN) 150 MG capsule   Oral   Take 1 capsule (150 mg total) by mouth every 6 (six) hours.   28 capsule   0   . famotidine (PEPCID) 20 MG tablet   Oral   Take 1 tablet (20 mg total) by mouth 2 (two) times daily.   30 tablet   0   . fluticasone (FLONASE) 50 MCG/ACT nasal spray   Nasal   Place 2 sprays into the nose daily.           Marland Kitchen HYDROcodone-acetaminophen (NORCO/VICODIN) 5-325 MG per tablet   Oral   Take 1 tablet by mouth every 6 (six)  hours as needed for moderate pain.   12 tablet   0   . hydrOXYzine (ATARAX/VISTARIL) 25 MG tablet   Oral   Take 2 tablets (50 mg total) by mouth at bedtime as needed for itching.   12 tablet   0   . ibuprofen (ADVIL,MOTRIN) 800 MG tablet   Oral   Take 1 tablet (800 mg total) by mouth 3 (three) times daily.   21 tablet   0   . lithium carbonate 300 MG capsule   Oral   Take 300-600 mg by mouth 2 (two) times daily. Take one capsule (300mg ) every day in the morning, and take two capsules (600mg ) every day at bedtime         . propranolol (INNOPRAN XL) 80 MG 24 hr capsule   Oral   Take 80 mg by mouth daily.          . QUEtiapine  (SEROQUEL) 300 MG tablet   Oral   Take 300 mg by mouth every evening.          . ranitidine (ZANTAC) 150 MG capsule   Oral   Take 1 capsule (150 mg total) by mouth daily.   15 capsule   0   . EPINEPHrine (EPI-PEN) 0.3 mg/0.3 mL SOAJ injection   Intramuscular   Inject 0.3 mLs (0.3 mg total) into the muscle once.   1 Device   2    Triage Vitals:BP 112/59  Pulse 72  Temp(Src) 98 F (36.7 C) (Oral)  Resp 19  Ht 5\' 8"  (1.727 m)  Wt 126 lb (57.153 kg)  BMI 19.16 kg/m2  SpO2 100%  Physical Exam  Nursing note and vitals reviewed. Constitutional: He is oriented to person, place, and time. He appears well-developed and well-nourished.  HENT:  Head: Normocephalic.  Right Ear: External ear normal.  Left Ear: External ear normal.  Nose: Nose normal.  Mouth/Throat: Oropharynx is clear and moist.  Eyes: EOM are normal. Pupils are equal, round, and reactive to light. Right eye exhibits no discharge. Left eye exhibits no discharge.  Neck: Normal range of motion. Neck supple. No tracheal deviation present.  No nuchal rigidity no meningeal signs  Cardiovascular: Normal rate, regular rhythm and normal heart sounds.   Pulmonary/Chest: Effort normal and breath sounds normal. No stridor. No respiratory distress. He has no wheezes. He has no rales.  Abdominal: Soft. Bowel sounds are normal. He exhibits no distension and no mass. There is no tenderness. There is no rebound and no guarding.  Tender to upper and lower abdomen  Musculoskeletal: Normal range of motion. He exhibits no edema and no tenderness.  Neurological: He is alert and oriented to person, place, and time. He has normal reflexes. No cranial nerve deficit. Coordination normal.  Skin: Skin is warm. No rash noted. He is not diaphoretic. No erythema. No pallor.  No pettechia no purpura  Psychiatric:  Anxious,simple answers, seemingly intellect   ED Course  Procedures  DIAGNOSTIC STUDIES: Oxygen Saturation is 100% on RA,  normal by my interpretation.    COORDINATION OF CARE: 3:55 PM-Discussed treatment plan which includes CT ABD, CBC, and IV fluids and medications.  Pt agreed to plan.   7:01 PM-Informed pt about radiology and lab findings.   Labs Review Labs Reviewed  CBC WITH DIFFERENTIAL - Abnormal; Notable for the following:    Hemoglobin 10.4 (*)    HCT 33.0 (*)    MCV 64.6 (*)    MCH 20.4 (*)    Eosinophils  Relative 6 (*)    All other components within normal limits  COMPREHENSIVE METABOLIC PANEL  LIPASE, BLOOD   Imaging Review Ct Abdomen Pelvis W Contrast  09/24/2013   CLINICAL DATA:  Abdominal pain post fall five days ago  EXAM: CT ABDOMEN AND PELVIS WITH CONTRAST  TECHNIQUE: Multidetector CT imaging of the abdomen and pelvis was performed using the standard protocol following bolus administration of intravenous contrast.  CONTRAST:  OMNIPAQUE IOHEXOL 300 MG/ML SOLN, 50mL OMNIPAQUE IOHEXOL 300 MG/ML SOLN  COMPARISON:  09/19/2013  FINDINGS: Sagittal images of the spine shows no acute fractures. No destructive bony lesions are noted. There is small atelectasis or infiltrate in left lower lobe posteriorly.  In his hands liver is unremarkable. No evidence of liver laceration. No calcified gallstones are noted within gallbladder. Mild splenomegaly again noted. The spleen measures about 13 cm in length. The pancreas and adrenal glands are unremarkable. There is no evidence of perisplenic fluid. No splenic laceration.  Kidneys are symmetrical in size and enhancement. No hydronephrosis or hydroureter. No aortic aneurysm.  Moderate stool noted in distal colon. The urinary bladder is unremarkable. No evidence of urinary bladder injury.  No small bowel obstruction.  No ascites or free air.  No adenopathy.  There is no pericecal inflammation. The terminal ileum is unremarkable. Normal appendix partially visualized axial image 40. Mild congested vessels are noted in left inguinal scrotal canal.  IMPRESSION: 1.  No acute visceral injury within abdomen or pelvis. Small area of focal atelectasis or infiltrate in left lower lobe posteriorly. 2. Mild splenomegaly with spleen measuring 13 cm in length. No splenic laceration. 3. No hydronephrosis or hydroureter. Mild congested vessels in left inguinal scrotal canal. 4. Normal appendix.   Electronically Signed   By: Natasha Mead M.D.   On: 09/24/2013 18:11    EKG Interpretation   None       MDM   1. Abdominal pain    CT scan is normal. Labs reassuring. I think is simply stop his treatment and medications. No apparent injuries from his fall.  I personally performed the services described in this documentation, which was scribed in my presence. The recorded information has been reviewed and is accurate.    Roney Marion, MD 09/24/13 [phone]

## 2013-10-02 ENCOUNTER — Emergency Department (HOSPITAL_COMMUNITY)
Admission: EM | Admit: 2013-10-02 | Discharge: 2013-10-02 | Disposition: A | Discharge status: Home / Self Care | Payer: Medicaid Other | Attending: Emergency Medicine | Admitting: Emergency Medicine

## 2013-10-02 ENCOUNTER — Encounter (HOSPITAL_COMMUNITY): Payer: Self-pay | Admitting: Emergency Medicine

## 2013-10-02 DIAGNOSIS — Z8739 Personal history of other diseases of the musculoskeletal system and connective tissue: Secondary | ICD-10-CM | POA: Insufficient documentation

## 2013-10-02 DIAGNOSIS — G8929 Other chronic pain: Secondary | ICD-10-CM | POA: Insufficient documentation

## 2013-10-02 DIAGNOSIS — Z880 Allergy status to penicillin: Secondary | ICD-10-CM | POA: Insufficient documentation

## 2013-10-02 DIAGNOSIS — Z792 Long term (current) use of antibiotics: Secondary | ICD-10-CM | POA: Insufficient documentation

## 2013-10-02 DIAGNOSIS — K029 Dental caries, unspecified: Secondary | ICD-10-CM | POA: Insufficient documentation

## 2013-10-02 DIAGNOSIS — IMO0002 Reserved for concepts with insufficient information to code with codable children: Secondary | ICD-10-CM | POA: Insufficient documentation

## 2013-10-02 DIAGNOSIS — K089 Disorder of teeth and supporting structures, unspecified: Secondary | ICD-10-CM | POA: Insufficient documentation

## 2013-10-02 DIAGNOSIS — Z79899 Other long term (current) drug therapy: Secondary | ICD-10-CM | POA: Insufficient documentation

## 2013-10-02 MED ORDER — CLINDAMYCIN HCL 150 MG PO CAPS: 150.0000 mg | ORAL_CAPSULE | Freq: Four times a day (QID) | ORAL | Status: DC

## 2013-10-02 MED ORDER — CLINDAMYCIN HCL 150 MG PO CAPS
300.0000 mg | ORAL_CAPSULE | Freq: Once | ORAL | Status: AC
  Administered 2013-10-02: 300 mg via ORAL
  Filled 2013-10-02: qty 2

## 2013-10-02 MED ORDER — HYDROCODONE-ACETAMINOPHEN 5-325 MG PO TABS
1.0000 | ORAL_TABLET | Freq: Once | ORAL | Status: AC
  Administered 2013-10-02: 1 via ORAL
  Filled 2013-10-02: qty 1

## 2013-10-02 NOTE — ED Notes (Signed)
Pt with dental pain since this afternoon, pt will not answer questions, father speaks up for pt

## 2013-10-02 NOTE — ED Provider Notes (Signed)
CSN: [id number]     Arrival date & time 10/02/13  1929 History   First MD Initiated Contact with Patient 10/02/13 1948     Chief Complaint  Patient presents with  . Dental Pain   (Consider location/radiation/quality/duration/timing/severity/associated sxs/prior Treatment) Patient is a 22 y.o. male presenting with tooth pain. The history is provided by the patient and a parent.  Dental Pain Location:  Lower Lower teeth location: entire lower dentition. Quality:  Sharp and throbbing Severity:  Moderate Onset quality:  Gradual Duration:  4 hours Timing:  Constant Progression:  Worsening Chronicity:  Chronic Context: dental caries and poor dentition   Context: not malocclusion and not trauma   Relieved by:  Nothing Worsened by:  Cold food/drink and hot food/drink Associated symptoms: no congestion, no facial pain, no facial swelling, no fever, no gum swelling, no headaches, no neck pain, no neck swelling, no oral bleeding, no oral lesions and no trismus   Risk factors: lack of dental care and periodontal disease   Risk factors: no diabetes and no smoking     Past Medical History  Diagnosis Date  . ADHD (attention deficit hyperactivity disorder)   . Back pain   . Shoulder pain    History reviewed. No pertinent past surgical history. Family History  Problem Relation Age of Onset  . Stroke Father   . Seizures Mother   . Seizures Brother    History  Substance Use Topics  . Smoking status: Never Smoker   . Smokeless tobacco: Never Used  . Alcohol Use: No    Review of Systems  Constitutional: Negative for fever and appetite change.  HENT: Positive for dental problem. Negative for congestion, facial swelling, mouth sores, sore throat and trouble swallowing.   Eyes: Negative for pain and visual disturbance.  Musculoskeletal: Negative for neck pain and neck stiffness.  Neurological: Negative for dizziness, facial asymmetry and headaches.  Hematological: Negative for  adenopathy.  All other systems reviewed and are negative.    Allergies  Codeine; Lactose intolerance (gi); Percocet; and Penicillins  Home Medications   Current Outpatient Rx  Name  Route  Sig  Dispense  Refill  . alprazolam (XANAX) 2 MG tablet   Oral   Take 2 mg by mouth 4 (four) times daily as needed. For anxiety         . clindamycin (CLEOCIN) 150 MG capsule   Oral   Take 1 capsule (150 mg total) by mouth every 6 (six) hours.   28 capsule   0   . clindamycin (CLEOCIN) 150 MG capsule   Oral   Take 1 capsule (150 mg total) by mouth 4 (four) times daily.   28 capsule   0   . EPINEPHrine (EPI-PEN) 0.3 mg/0.3 mL SOAJ injection   Intramuscular   Inject 0.3 mLs (0.3 mg total) into the muscle once.   1 Device   2   . famotidine (PEPCID) 20 MG tablet   Oral   Take 1 tablet (20 mg total) by mouth 2 (two) times daily.   30 tablet   0   . fluticasone (FLONASE) 50 MCG/ACT nasal spray   Nasal   Place 2 sprays into the nose daily.           Marland Kitchen HYDROcodone-acetaminophen (NORCO/VICODIN) 5-325 MG per tablet   Oral   Take 1 tablet by mouth every 6 (six) hours as needed for moderate pain.   12 tablet   0   . hydrOXYzine (ATARAX/VISTARIL) 25 MG tablet  Oral   Take 2 tablets (50 mg total) by mouth at bedtime as needed for itching.   12 tablet   0   . ibuprofen (ADVIL,MOTRIN) 800 MG tablet   Oral   Take 1 tablet (800 mg total) by mouth 3 (three) times daily.   21 tablet   0   . lithium carbonate 300 MG capsule   Oral   Take 300-600 mg by mouth 2 (two) times daily. Take one capsule (300mg ) every day in the morning, and take two capsules (600mg ) every day at bedtime         . propranolol (INNOPRAN XL) 80 MG 24 hr capsule   Oral   Take 80 mg by mouth daily.          . QUEtiapine (SEROQUEL) 300 MG tablet   Oral   Take 300 mg by mouth every evening.          . ranitidine (ZANTAC) 150 MG capsule   Oral   Take 1 capsule (150 mg total) by mouth daily.   15  capsule   0    BP 119/67  Pulse 68  Temp(Src) 97.5 F (36.4 C) (Oral)  Resp 24  Ht 5\' 6"  (1.676 m)  Wt 140 lb (63.504 kg)  BMI 22.61 kg/m2  SpO2 98% Physical Exam  Nursing note and vitals reviewed. Constitutional: He is oriented to person, place, and time. He appears well-developed and well-nourished. No distress.  HENT:  Head: Normocephalic and atraumatic.  Right Ear: Tympanic membrane and ear canal normal.  Left Ear: Tympanic membrane and ear canal normal.  Mouth/Throat: Uvula is midline, oropharynx is clear and moist and mucous membranes are normal. No trismus in the jaw. Dental caries present. No dental abscesses or uvula swelling.  Widespread dental decay and periodontal dz, all lower teeth have exposed dentin.   No facial swelling, obvious dental abscess, trismus, or sublingual abnml.    Neck: Normal range of motion. Neck supple.  Cardiovascular: Normal rate, regular rhythm, normal heart sounds and intact distal pulses.   No murmur heard. Pulmonary/Chest: Effort normal and breath sounds normal. No respiratory distress.  Musculoskeletal: Normal range of motion.  Lymphadenopathy:    He has no cervical adenopathy.  Neurological: He is alert and oriented to person, place, and time. He exhibits normal muscle tone. Coordination normal.  Skin: Skin is warm and dry.    ED Course  Procedures (including critical care time) Labs Review Labs Reviewed - No data to display Imaging Review No results found.  EKG Interpretation   None       MDM   1. Chronic dental pain     Multiple ed visits for same. No concerning sx's for abscess or Ludwig's angina.   Father states he has appt with a dentist for January.  Advised to take ibuprofen for his pain and I will prescribe clindamycin, patient and father verbalized understanding that narcotic prescription is not indicated at this time.  Patient agrees to care plan.      Raylynn Hersh L. Trisha Mangle, PA-C 10/03/13 0116

## 2013-10-04 NOTE — ED Provider Notes (Signed)
Medical screening examination/treatment/procedure(s) were performed by non-physician practitioner and as supervising physician I was immediately available for consultation/collaboration.  EKG Interpretation   None         Mahima Hottle L Shrita Thien, MD 10/04/13 2315 

## 2013-10-12 ENCOUNTER — Emergency Department (HOSPITAL_COMMUNITY)
Admission: EM | Admit: 2013-10-12 | Discharge: 2013-10-12 | Disposition: A | Discharge status: Home / Self Care | Payer: Medicaid Other | Attending: Emergency Medicine | Admitting: Emergency Medicine

## 2013-10-12 ENCOUNTER — Encounter (HOSPITAL_COMMUNITY): Payer: Self-pay | Admitting: Emergency Medicine

## 2013-10-12 DIAGNOSIS — Z87828 Personal history of other (healed) physical injury and trauma: Secondary | ICD-10-CM | POA: Insufficient documentation

## 2013-10-12 DIAGNOSIS — Z8659 Personal history of other mental and behavioral disorders: Secondary | ICD-10-CM | POA: Insufficient documentation

## 2013-10-12 DIAGNOSIS — Z880 Allergy status to penicillin: Secondary | ICD-10-CM | POA: Insufficient documentation

## 2013-10-12 DIAGNOSIS — M545 Low back pain, unspecified: Secondary | ICD-10-CM | POA: Insufficient documentation

## 2013-10-12 DIAGNOSIS — Z79899 Other long term (current) drug therapy: Secondary | ICD-10-CM | POA: Insufficient documentation

## 2013-10-12 DIAGNOSIS — IMO0002 Reserved for concepts with insufficient information to code with codable children: Secondary | ICD-10-CM | POA: Insufficient documentation

## 2013-10-12 DIAGNOSIS — R52 Pain, unspecified: Secondary | ICD-10-CM | POA: Insufficient documentation

## 2013-10-12 DIAGNOSIS — G8929 Other chronic pain: Secondary | ICD-10-CM

## 2013-10-12 DIAGNOSIS — G8921 Chronic pain due to trauma: Secondary | ICD-10-CM | POA: Insufficient documentation

## 2013-10-12 MED ORDER — HYDROMORPHONE HCL PF 1 MG/ML IJ SOLN
1.0000 mg | Freq: Once | INTRAMUSCULAR | Status: AC
  Administered 2013-10-12: 1 mg via INTRAMUSCULAR
  Filled 2013-10-12: qty 1

## 2013-10-12 NOTE — ED Notes (Signed)
Low back pain for 5 years. Pain worse x3 days

## 2013-10-12 NOTE — ED Notes (Signed)
Pt c/o lower back pain x3 days. Pt has hx of chronic back pain x5 years. Pt denies new injury.
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
## 2013-10-18 ENCOUNTER — Emergency Department (HOSPITAL_COMMUNITY)
Admission: EM | Admit: 2013-10-18 | Discharge: 2013-10-18 | Disposition: A | Discharge status: Home / Self Care | Payer: Medicaid Other | Attending: Emergency Medicine | Admitting: Emergency Medicine

## 2013-10-18 ENCOUNTER — Encounter (HOSPITAL_COMMUNITY): Payer: Self-pay | Admitting: Emergency Medicine

## 2013-10-18 DIAGNOSIS — Z8739 Personal history of other diseases of the musculoskeletal system and connective tissue: Secondary | ICD-10-CM | POA: Insufficient documentation

## 2013-10-18 DIAGNOSIS — H9190 Unspecified hearing loss, unspecified ear: Secondary | ICD-10-CM | POA: Insufficient documentation

## 2013-10-18 DIAGNOSIS — Z79899 Other long term (current) drug therapy: Secondary | ICD-10-CM | POA: Insufficient documentation

## 2013-10-18 DIAGNOSIS — H6123 Impacted cerumen, bilateral: Secondary | ICD-10-CM

## 2013-10-18 DIAGNOSIS — IMO0002 Reserved for concepts with insufficient information to code with codable children: Secondary | ICD-10-CM | POA: Insufficient documentation

## 2013-10-18 DIAGNOSIS — Z880 Allergy status to penicillin: Secondary | ICD-10-CM | POA: Insufficient documentation

## 2013-10-18 DIAGNOSIS — H6120 Impacted cerumen, unspecified ear: Secondary | ICD-10-CM | POA: Insufficient documentation

## 2013-10-18 DIAGNOSIS — F909 Attention-deficit hyperactivity disorder, unspecified type: Secondary | ICD-10-CM | POA: Insufficient documentation

## 2013-10-18 MED ORDER — HYDROGEN PEROXIDE 3 % EX SOLN
CUTANEOUS | Status: AC
  Filled 2013-10-18: qty 473

## 2013-10-18 MED ORDER — IBUPROFEN 400 MG PO TABS
600.0000 mg | ORAL_TABLET | Freq: Once | ORAL | Status: AC
Start: 2013-10-18 — End: 2013-10-18
  Administered 2013-10-18: 600 mg via ORAL
  Filled 2013-10-18: qty 2
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
## 2013-10-18 NOTE — ED Notes (Signed)
bil ear pain and decreased hearing , "He needs his ears flushed out"

## 2013-10-20 ENCOUNTER — Emergency Department (HOSPITAL_COMMUNITY)
Admission: EM | Admit: 2013-10-20 | Discharge: 2013-10-20 | Disposition: A | Discharge status: Home / Self Care | Payer: Medicaid Other | Attending: Emergency Medicine | Admitting: Emergency Medicine

## 2013-10-20 ENCOUNTER — Encounter (HOSPITAL_COMMUNITY): Payer: Self-pay | Admitting: Emergency Medicine

## 2013-10-20 DIAGNOSIS — G8929 Other chronic pain: Secondary | ICD-10-CM | POA: Insufficient documentation

## 2013-10-20 DIAGNOSIS — K0889 Other specified disorders of teeth and supporting structures: Secondary | ICD-10-CM

## 2013-10-20 DIAGNOSIS — K089 Disorder of teeth and supporting structures, unspecified: Secondary | ICD-10-CM | POA: Insufficient documentation

## 2013-10-20 DIAGNOSIS — M549 Dorsalgia, unspecified: Secondary | ICD-10-CM | POA: Insufficient documentation

## 2013-10-20 DIAGNOSIS — Z79899 Other long term (current) drug therapy: Secondary | ICD-10-CM | POA: Insufficient documentation

## 2013-10-20 DIAGNOSIS — Z8659 Personal history of other mental and behavioral disorders: Secondary | ICD-10-CM | POA: Insufficient documentation

## 2013-10-20 DIAGNOSIS — IMO0002 Reserved for concepts with insufficient information to code with codable children: Secondary | ICD-10-CM | POA: Insufficient documentation

## 2013-10-20 DIAGNOSIS — Z880 Allergy status to penicillin: Secondary | ICD-10-CM | POA: Insufficient documentation

## 2013-10-20 MED ORDER — CLINDAMYCIN HCL 150 MG PO CAPS
300.0000 mg | ORAL_CAPSULE | Freq: Once | ORAL | Status: AC
  Administered 2013-10-20: 300 mg via ORAL
  Filled 2013-10-20: qty 2

## 2013-10-20 MED ORDER — CLINDAMYCIN HCL 150 MG PO CAPS: 300.0000 mg | ORAL_CAPSULE | Freq: Three times a day (TID) | ORAL | Status: DC

## 2013-10-20 NOTE — Discharge Instructions (Signed)

## 2013-10-20 NOTE — ED Notes (Signed)
Pt c/o dental abscess x 2 days

## 2013-10-20 NOTE — ED Provider Notes (Signed)
CSN: [id number]     Arrival date & time 10/20/13  1451 History   First MD Initiated Contact with Patient 10/20/13 1536     Chief Complaint  Patient presents with  . Dental Pain   (Consider location/radiation/quality/duration/timing/severity/associated sxs/prior Treatment) HPI Comments: Patient presents to the emergency department with chief complaint of dental pain. He has chronic back pain, and chronic poor dentition. He does not have a dentist. He states that the pain is worsened with chewing. He states that the pain also worsened approximately 2 days ago. He denies any fevers or chills. States that nothing makes his symptoms better or worse. Denies any difficulty swallowing.  The history is provided by the patient. No language interpreter was used.    Past Medical History  Diagnosis Date  . ADHD (attention deficit hyperactivity disorder)   . Back pain   . Shoulder pain    History reviewed. No pertinent past surgical history. Family History  Problem Relation Age of Onset  . Stroke Father   . Seizures Mother   . Seizures Brother    History  Substance Use Topics  . Smoking status: Never Smoker   . Smokeless tobacco: Never Used  . Alcohol Use: No    Review of Systems  All other systems reviewed and are negative.    Allergies  Codeine; Lactose intolerance (gi); Percocet; and Penicillins  Home Medications   Current Outpatient Rx  Name  Route  Sig  Dispense  Refill  . alprazolam (XANAX) 2 MG tablet   Oral   Take 2 mg by mouth 4 (four) times daily as needed. For anxiety         . EPINEPHrine (EPI-PEN) 0.3 mg/0.3 mL SOAJ injection   Intramuscular   Inject 0.3 mLs (0.3 mg total) into the muscle once.   1 Device   2   . famotidine (PEPCID) 20 MG tablet   Oral   Take 1 tablet (20 mg total) by mouth 2 (two) times daily.   30 tablet   0   . fluticasone (FLONASE) 50 MCG/ACT nasal spray   Nasal   Place 2 sprays into the nose daily.           . hydrOXYzine  (ATARAX/VISTARIL) 25 MG tablet   Oral   Take 2 tablets (50 mg total) by mouth at bedtime as needed for itching.   12 tablet   0   . lithium carbonate 300 MG capsule   Oral   Take 300-600 mg by mouth 2 (two) times daily. Take one capsule (300mg ) every day in the morning, and take two capsules (600mg ) every day at bedtime         . propranolol (INNOPRAN XL) 80 MG 24 hr capsule   Oral   Take 80 mg by mouth daily.          . QUEtiapine (SEROQUEL) 300 MG tablet   Oral   Take 300 mg by mouth every evening.          . ranitidine (ZANTAC) 150 MG capsule   Oral   Take 1 capsule (150 mg total) by mouth daily.   15 capsule   0    BP 108/62  Pulse 88  Temp(Src) 98.5 F (36.9 C)  Resp 18  Ht 5\' 4"  (1.626 m)  Wt 132 lb (59.875 kg)  BMI 22.65 kg/m2  SpO2 100% Physical Exam  Nursing note and vitals reviewed. Constitutional: He is oriented to person, place, and time. He appears well-developed and  well-nourished.  HENT:  Head: Normocephalic and atraumatic.  Mouth/Throat:    Poor dentition throughout.  Affected tooth as diagrammed.  No signs of peritonsillar or tonsillar abscess.  No signs of gingival abscess. Oropharynx is clear and without exudates.  Uvula is midline.  Airway is intact. No signs of Ludwig's angina with palpation of oral and sublingual mucosa.   Eyes: Conjunctivae and EOM are normal.  Neck: Normal range of motion.  Cardiovascular: Normal rate.   Pulmonary/Chest: Effort normal.  Abdominal: He exhibits no distension.  Musculoskeletal: Normal range of motion.  Neurological: He is alert and oriented to person, place, and time.  Skin: Skin is dry.  Psychiatric: He has a normal mood and affect. His behavior is normal. Judgment and thought content normal.    ED Course  Procedures (including critical care time) Labs Review Labs Reviewed - No data to display Imaging Review No results found.  EKG Interpretation   None       MDM   1. Pain, dental      Patient with toothache.  No gross abscess.  Exam unconcerning for Ludwig's angina or spread of infection.  Will treat with clindamycin and tylenol/ibuprofen.  Urged patient to follow-up with dentist.    Patient tells me that he has an allergy to clindamycin, yet he has taken it multiple times in the past year.  He also tells me that he does not have an allergy to penicillin, which is on his allergy list.  I am going to give the patient clindamycin and will observe him for fifteen or twenty minutes.  Patient tolerated the clindamycin without complication.  Will discharge to home with this and recommend dental follow-up.    Roxy Horseman, PA-C 10/23/13 [phone]

## 2013-10-20 NOTE — ED Notes (Signed)
Swelling rt lower jaw, for 4 days.  Has mult dental caries.  Alert,

## 2013-10-27 NOTE — ED Provider Notes (Signed)
Medical screening examination/treatment/procedure(s) were performed by non-physician practitioner and as supervising physician I was immediately available for consultation/collaboration.  EKG Interpretation   None        Donnetta HutchingBrian Eulanda Dorion, MD 10/27/13 1544

## 2013-10-31 ENCOUNTER — Encounter (HOSPITAL_COMMUNITY): Payer: Self-pay | Admitting: Emergency Medicine

## 2013-10-31 ENCOUNTER — Emergency Department (HOSPITAL_COMMUNITY): Payer: Medicaid Other

## 2013-10-31 ENCOUNTER — Emergency Department (HOSPITAL_COMMUNITY)
Admission: EM | Admit: 2013-10-31 | Discharge: 2013-10-31 | Disposition: A | Discharge status: Home / Self Care | Payer: Medicaid Other | Attending: Emergency Medicine | Admitting: Emergency Medicine

## 2013-10-31 DIAGNOSIS — Z8659 Personal history of other mental and behavioral disorders: Secondary | ICD-10-CM | POA: Insufficient documentation

## 2013-10-31 DIAGNOSIS — Z880 Allergy status to penicillin: Secondary | ICD-10-CM | POA: Insufficient documentation

## 2013-10-31 DIAGNOSIS — Z79899 Other long term (current) drug therapy: Secondary | ICD-10-CM | POA: Insufficient documentation

## 2013-10-31 DIAGNOSIS — IMO0002 Reserved for concepts with insufficient information to code with codable children: Secondary | ICD-10-CM | POA: Insufficient documentation

## 2013-10-31 DIAGNOSIS — R109 Unspecified abdominal pain: Secondary | ICD-10-CM | POA: Insufficient documentation

## 2013-10-31 DIAGNOSIS — Z792 Long term (current) use of antibiotics: Secondary | ICD-10-CM | POA: Insufficient documentation

## 2013-10-31 DIAGNOSIS — Z8739 Personal history of other diseases of the musculoskeletal system and connective tissue: Secondary | ICD-10-CM | POA: Insufficient documentation

## 2013-10-31 LAB — COMPREHENSIVE METABOLIC PANEL
ALT: 21 U/L (ref 0–53)
AST: 20 U/L (ref 0–37)
Albumin: 4.7 g/dL (ref 3.5–5.2)
Alkaline Phosphatase: 94 U/L (ref 39–117)
BUN: 6 mg/dL (ref 6–23)
CALCIUM: 10.4 mg/dL (ref 8.4–10.5)
CO2: 29 mEq/L (ref 19–32)
Chloride: 102 mEq/L (ref 96–112)
Creatinine, Ser: 0.81 mg/dL (ref 0.50–1.35)
GFR calc Af Amer: >90 mL/min (ref >90)
GFR calc non Af Amer: >90 mL/min (ref >90)
GLUCOSE: 94 mg/dL (ref 70–99)
Potassium: 4.4 mEq/L (ref 3.7–5.3)
SODIUM: 142 meq/L (ref 137–147)
TOTAL PROTEIN: 8.1 g/dL (ref 6.0–8.3)
Total Bilirubin: 0.7 mg/dL (ref 0.3–1.2)

## 2013-10-31 LAB — CBC WITH DIFFERENTIAL/PLATELET
Basophils Absolute: 0 10*3/uL (ref 0.0–0.1)
Basophils Relative: 0 % (ref 0–1)
EOS ABS: 0.7 10*3/uL (ref 0.0–0.7)
Eosinophils Relative: 6 % — ABNORMAL HIGH (ref 0–5)
HCT: 34.5 % — ABNORMAL LOW (ref 39.0–52.0)
HEMOGLOBIN: 11.3 g/dL — AB (ref 13.0–17.0)
Lymphocytes Relative: 18 % (ref 12–46)
Lymphs Abs: 2.1 10*3/uL (ref 0.7–4.0)
MCH: 21 pg — AB (ref 26.0–34.0)
MCHC: 32.8 g/dL (ref 30.0–36.0)
MCV: 64.1 fL — ABNORMAL LOW (ref 78.0–100.0)
MONO ABS: 0.6 10*3/uL (ref 0.1–1.0)
Monocytes Relative: 5 % (ref 3–12)
NEUTROS ABS: 8 10*3/uL — AB (ref 1.7–7.7)
NEUTROS PCT: 71 % (ref 43–77)
Platelets: 236 10*3/uL (ref 150–400)
RBC: 5.38 MIL/uL (ref 4.22–5.81)
RDW: 15 % (ref 11.5–15.5)
WBC: 11.4 10*3/uL — ABNORMAL HIGH (ref 4.0–10.5)

## 2013-10-31 LAB — URINALYSIS, ROUTINE W REFLEX MICROSCOPIC
Bilirubin Urine: NEGATIVE
Glucose, UA: NEGATIVE mg/dL
Hgb urine dipstick: NEGATIVE
Ketones, ur: NEGATIVE mg/dL
Leukocytes, UA: NEGATIVE
NITRITE: NEGATIVE
Protein, ur: NEGATIVE mg/dL
Specific Gravity, Urine: 1.015 (ref 1.005–1.030)
UROBILINOGEN UA: 0.2 mg/dL (ref 0.0–1.0)
pH: 6.5 (ref 5.0–8.0)

## 2013-10-31 LAB — LIPASE, BLOOD: LIPASE: 20 U/L (ref 11–59)

## 2013-10-31 MED ORDER — FAMOTIDINE 20 MG PO TABS
20.0000 mg | ORAL_TABLET | Freq: Once | ORAL | Status: AC
  Administered 2013-10-31: 20 mg via ORAL
  Filled 2013-10-31: qty 1

## 2013-10-31 MED ORDER — FAMOTIDINE 20 MG PO TABS: 20.0000 mg | ORAL_TABLET | Freq: Two times a day (BID) | ORAL | Status: DC

## 2013-10-31 MED ORDER — GI COCKTAIL ~~LOC~~
30.0000 mL | Freq: Once | ORAL | Status: AC
  Administered 2013-10-31: 30 mL via ORAL
  Filled 2013-10-31: qty 30

## 2013-10-31 NOTE — ED Provider Notes (Signed)
CSN: [id number]     Arrival date & time 10/31/13  1433 History  This chart was scribed for Ward Givens, MD by Bennett Scrape, ED Scribe. This patient was seen in room [facility]12/[facility]12 and the patient's care was started at 5:26 PM.   Chief Complaint  Patient presents with  . Abdominal Pain   The history is provided by the patient. No language interpreter was used.    HPI Comments: Eugene Hicks is a 23 y.o. male who presents to the Emergency Department complaining of persistent mid abdominal pain while watching TV today. The episode started around 1:30 PM and he describes the pain as sharp and twisting. He reports that he has taken Tylenol without improvement. He denies any nausea, emesis, diarrhea. He denies any changes in bowel movements or changes in diet. He states that he has been experiencing similar pain since a deer stand collapsed on him one month ago. He had a negative CT of his abdomen at the time. He denies any drug or alcohol use. He denies smoking.    PCP Dr Glenis Smoker  Past Medical History  Diagnosis Date  . ADHD (attention deficit hyperactivity disorder)   . Back pain   . Shoulder pain    History reviewed. No pertinent past surgical history. Family History  Problem Relation Age of Onset  . Stroke Father   . Seizures Mother   . Seizures Brother    History  Substance Use Topics  . Smoking status: Never Smoker   . Smokeless tobacco: Never Used  . Alcohol Use: No  disability for ADHD.  Lives with parents  Review of Systems  Gastrointestinal: Positive for abdominal pain. Negative for nausea, vomiting and diarrhea.  All other systems reviewed and are negative.    Allergies  Codeine; Lactose intolerance (gi); Percocet; and Penicillins  Home Medications   Current Outpatient Rx  Name  Route  Sig  Dispense  Refill  . alprazolam (XANAX) 2 MG tablet   Oral   Take 2 mg by mouth 4 (four) times daily as needed. For anxiety         . clindamycin (CLEOCIN) 150 MG  capsule   Oral   Take 2 capsules (300 mg total) by mouth 3 (three) times daily. May dispense as 150mg  capsules   60 capsule   0   . EPINEPHrine (EPI-PEN) 0.3 mg/0.3 mL SOAJ injection   Intramuscular   Inject 0.3 mLs (0.3 mg total) into the muscle once.   1 Device   2   . famotidine (PEPCID) 20 MG tablet   Oral   Take 1 tablet (20 mg total) by mouth 2 (two) times daily.   30 tablet   0   . fluticasone (FLONASE) 50 MCG/ACT nasal spray   Nasal   Place 2 sprays into the nose daily.           . hydrOXYzine (ATARAX/VISTARIL) 25 MG tablet   Oral   Take 2 tablets (50 mg total) by mouth at bedtime as needed for itching.   12 tablet   0   . lithium carbonate 300 MG capsule   Oral   Take 300-600 mg by mouth 2 (two) times daily. Take one capsule (300mg ) every day in the morning, and take two capsules (600mg ) every day at bedtime         . propranolol (INNOPRAN XL) 80 MG 24 hr capsule   Oral   Take 80 mg by mouth daily.          Marland Kitchen  QUEtiapine (SEROQUEL) 300 MG tablet   Oral   Take 300 mg by mouth every evening.          . ranitidine (ZANTAC) 150 MG capsule   Oral   Take 1 capsule (150 mg total) by mouth daily.   15 capsule   0    Triage vitals: BP 104/47  Pulse 60  Temp(Src) 98.2 F (36.8 C) (Oral)  Resp 18  Ht 5\' 6"  (1.676 m)  Wt 128 lb (58.06 kg)  BMI 20.67 kg/m2  SpO2 100%  Vital signs normal    Physical Exam  Nursing note and vitals reviewed. Constitutional: He is oriented to person, place, and time. He appears well-developed and well-nourished.  Non-toxic appearance. He does not appear ill. No distress.  HENT:  Head: Normocephalic and atraumatic.  Right Ear: External ear normal.  Left Ear: External ear normal.  Nose: Nose normal. No mucosal edema or rhinorrhea.  Mouth/Throat: Oropharynx is clear and moist and mucous membranes are normal. No dental abscesses or uvula swelling.  Eyes: Conjunctivae and EOM are normal. Pupils are equal, round, and  reactive to light.  Neck: Normal range of motion and full passive range of motion without pain. Neck supple.  Cardiovascular: Normal rate, regular rhythm and normal heart sounds.  Exam reveals no gallop and no friction rub.   No murmur heard. Pulmonary/Chest: Effort normal and breath sounds normal. No respiratory distress. He has no wheezes. He has no rhonchi. He has no rales. He exhibits no tenderness and no crepitus.  Abdominal: Soft. Normal appearance and bowel sounds are normal. He exhibits no distension. There is no tenderness. There is no rebound and no guarding.  No reaction to palpation   Musculoskeletal: Normal range of motion. He exhibits no edema and no tenderness.  Moves all extremities well.   Neurological: He is alert and oriented to person, place, and time. He has normal strength. No cranial nerve deficit.  Skin: Skin is warm, dry and intact. No rash noted. No erythema. No pallor.  Diffuse blackheads noted on face  Psychiatric: He has a normal mood and affect. His speech is normal and behavior is normal. His mood appears not anxious.    ED Course  Procedures (including critical care time)  DIAGNOSTIC STUDIES: Oxygen Saturation is 100% on RA, normal by my interpretation.    COORDINATION OF CARE: 5:33 PM-During exam, although the pt denied diarrhea, a woman called who claimed that the pt was having diarrhea. Discussed treatment plan which includes  (abdominal xrays, CBC panel, CMP, UA) with pt at bedside and pt agreed to plan.   Patient is frequent ED visitor brought in by his family members. He has been seen multiple times for dental problems, back pain, abdominal pain, father states "he has unfortunate things happen to him".  CT of abdomen on Dec 7th, 2014 that showed no acute findings.   6:58 PM-Informed pt of benign radiology and lab work results. Pt dressed and standing in the room in NAD.  States he is feeling better.  Discussed discharge plan which includes OTC  medications for pain with pt and pt agreed to plan. Pt's father requested pain medication which I declined. Also advised pt to follow up as needed and pt agreed. Addressed symptoms to return for with pt.   Results for orders placed during the hospital encounter of 10/31/13  CBC WITH DIFFERENTIAL      Result Value Range   WBC 11.4 (*) 4.0 - 10.5 K/uL   RBC  5.38  4.22 - 5.81 MIL/uL   Hemoglobin 11.3 (*) 13.0 - 17.0 g/dL   HCT 16.1 (*) 09.6 - 04.5 %   MCV 64.1 (*) 78.0 - 100.0 fL   MCH 21.0 (*) 26.0 - 34.0 pg   MCHC 32.8  30.0 - 36.0 g/dL   RDW 40.9  81.1 - 91.4 %   Platelets 236  150 - 400 K/uL   Neutrophils Relative % 71  43 - 77 %   Lymphocytes Relative 18  12 - 46 %   Monocytes Relative 5  3 - 12 %   Eosinophils Relative 6 (*) 0 - 5 %   Basophils Relative 0  0 - 1 %   Neutro Abs 8.0 (*) 1.7 - 7.7 K/uL   Lymphs Abs 2.1  0.7 - 4.0 K/uL   Monocytes Absolute 0.6  0.1 - 1.0 K/uL   Eosinophils Absolute 0.7  0.0 - 0.7 K/uL   Basophils Absolute 0.0  0.0 - 0.1 K/uL   RBC Morphology POLYCHROMASIA PRESENT    COMPREHENSIVE METABOLIC PANEL      Result Value Range   Sodium 142  137 - 147 mEq/L   Potassium 4.4  3.7 - 5.3 mEq/L   Chloride 102  96 - 112 mEq/L   CO2 29  19 - 32 mEq/L   Glucose, Bld 94  70 - 99 mg/dL   BUN 6  6 - 23 mg/dL   Creatinine, Ser 7.82  0.50 - 1.35 mg/dL   Calcium 95.6  8.4 - 21.3 mg/dL   Total Protein 8.1  6.0 - 8.3 g/dL   Albumin 4.7  3.5 - 5.2 g/dL   AST 20  0 - 37 U/L   ALT 21  0 - 53 U/L   Alkaline Phosphatase 94  39 - 117 U/L   Total Bilirubin 0.7  0.3 - 1.2 mg/dL   GFR calc non Af Amer >90  >90 mL/min   GFR calc Af Amer >90  >90 mL/min  LIPASE, BLOOD      Result Value Range   Lipase 20  11 - 59 U/L  URINALYSIS, ROUTINE W REFLEX MICROSCOPIC      Result Value Range   Color, Urine YELLOW  YELLOW   APPearance CLEAR  CLEAR   Specific Gravity, Urine 1.015  1.005 - 1.030   pH 6.5  5.0 - 8.0   Glucose, UA NEGATIVE  NEGATIVE mg/dL   Hgb urine dipstick  NEGATIVE  NEGATIVE   Bilirubin Urine NEGATIVE  NEGATIVE   Ketones, ur NEGATIVE  NEGATIVE mg/dL   Protein, ur NEGATIVE  NEGATIVE mg/dL   Urobilinogen, UA 0.2  0.0 - 1.0 mg/dL   Nitrite NEGATIVE  NEGATIVE   Leukocytes, UA NEGATIVE  NEGATIVE   Laboratory interpretation all normal except leukocytosis   Dg Abd Acute W/chest  10/31/2013   CLINICAL DATA:  Abdominal pain.  EXAM: ACUTE ABDOMEN SERIES (ABDOMEN 2 VIEW & CHEST 1 VIEW)  COMPARISON:  CT chest 09/19/2013 and plain film of the chest 08/20/2013.  FINDINGS: Lungs are clear. Heart size is normal. No pneumothorax or pleural fluid.  IMPRESSION: Negative chest.   Electronically Signed   By: Drusilla Kanner M.D.   On: 10/31/2013 18:14     EKG Interpretation   None       MDM   1. Abdominal pain     New Prescriptions   FAMOTIDINE (PEPCID) 20 MG TABLET    Take 1 tablet (20 mg total) by mouth 2 (two) times  daily.    Devoria Albe, MD, FACEP    I personally performed the services described in this documentation, which was scribed in my presence. The recorded information has been reviewed and considered.  Devoria Albe, MD, [facility]   Ward Givens, MD 10/31/13 [phone]

## 2013-10-31 NOTE — ED Notes (Signed)
Mid abd pain starting about 1300 today .  Denies n/v/d.

## 2013-10-31 NOTE — ED Notes (Signed)
Patient presents today with a chief complaint of generalized soreness to anterior chest since falling off of a tree stand one week ago.  Patient ambulatory, skin intact, no deformity or bruising noted. Chest wall symmetric, no respiratory distress.

## 2013-10-31 NOTE — Discharge Instructions (Signed)
Take the pepcid twice a day for your abdominal pain. Recheck if you get worse.

## 2013-11-03 ENCOUNTER — Emergency Department (HOSPITAL_COMMUNITY)
Admission: EM | Admit: 2013-11-03 | Discharge: 2013-11-03 | Disposition: A | Discharge status: Home / Self Care | Payer: Medicaid Other | Attending: Emergency Medicine | Admitting: Emergency Medicine

## 2013-11-03 ENCOUNTER — Emergency Department (HOSPITAL_COMMUNITY): Payer: Medicaid Other

## 2013-11-03 ENCOUNTER — Encounter (HOSPITAL_COMMUNITY): Payer: Self-pay | Admitting: Emergency Medicine

## 2013-11-03 DIAGNOSIS — W208XXA Other cause of strike by thrown, projected or falling object, initial encounter: Secondary | ICD-10-CM | POA: Insufficient documentation

## 2013-11-03 DIAGNOSIS — Z880 Allergy status to penicillin: Secondary | ICD-10-CM | POA: Insufficient documentation

## 2013-11-03 DIAGNOSIS — Z792 Long term (current) use of antibiotics: Secondary | ICD-10-CM | POA: Insufficient documentation

## 2013-11-03 DIAGNOSIS — IMO0002 Reserved for concepts with insufficient information to code with codable children: Secondary | ICD-10-CM | POA: Insufficient documentation

## 2013-11-03 DIAGNOSIS — Y939 Activity, unspecified: Secondary | ICD-10-CM | POA: Insufficient documentation

## 2013-11-03 DIAGNOSIS — S9031XA Contusion of right foot, initial encounter: Secondary | ICD-10-CM

## 2013-11-03 DIAGNOSIS — S9030XA Contusion of unspecified foot, initial encounter: Secondary | ICD-10-CM | POA: Insufficient documentation

## 2013-11-03 DIAGNOSIS — Z79899 Other long term (current) drug therapy: Secondary | ICD-10-CM | POA: Insufficient documentation

## 2013-11-03 DIAGNOSIS — Y929 Unspecified place or not applicable: Secondary | ICD-10-CM | POA: Insufficient documentation

## 2013-11-03 DIAGNOSIS — Z8659 Personal history of other mental and behavioral disorders: Secondary | ICD-10-CM | POA: Insufficient documentation

## 2013-11-03 MED ORDER — NAPROXEN 500 MG PO TABS: 500.0000 mg | ORAL_TABLET | Freq: Two times a day (BID) | ORAL | Status: DC

## 2013-11-03 MED ORDER — IBUPROFEN 800 MG PO TABS
800.0000 mg | ORAL_TABLET | Freq: Once | ORAL | Status: AC
  Administered 2013-11-03: 800 mg via ORAL
  Filled 2013-11-03: qty 1

## 2013-11-03 NOTE — ED Notes (Signed)
Pt reports dropping a 65 pound weight on his right foot today.

## 2013-11-03 NOTE — Discharge Instructions (Signed)
Foot Contusion   A foot contusion is a deep bruise to the foot. Contusions happen when an injury causes bleeding under the skin. Signs of bruising include pain, puffiness (swelling), and discolored skin. The contusion may turn blue, purple, or yellow.  HOME CARE  · Put ice on the injured area.  · Put ice in a plastic bag.  · Place a towel between your skin and the bag.  · Leave the ice on for 15-20 minutes, 03-04 times a day.  · Only take medicines as told by your doctor.  · Use an elastic wrap only as told. You may remove the wrap for sleeping, showering, and bathing. Take the wrap off if you lose feeling (numb) in your toes, or they turn blue or cold. Put the wrap on more loosely.  · Keep the foot raised (elevated) with pillows.  · If your foot hurts, avoid standing or walking.  · When your doctor says it is okay to use your foot, start using it slowly. If you have pain, lessen how much you use your foot.  · See your doctor as told.  GET HELP RIGHT AWAY IF:   · You have more redness, puffiness, or pain in your foot.  · Your puffiness or pain does not get better with medicine.  · You lose feeling in your foot, or you cannot move your toes.  · Your foot turns cold or blue.  · You have pain when you move your toes.  · Your foot feels warm.  · Your contusion does not get better in 2 days.  MAKE SURE YOU:   · Understand these instructions.  · Will watch this condition.  · Will get help right away if you or your child is not doing well or gets worse.  Document Released: 07/14/2008 Document Revised: 04/05/2012 Document Reviewed: 09/08/2011  [employer]® Patient Information ©2014 [employer].
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
## 2013-11-04 NOTE — ED Provider Notes (Signed)
Medical screening examination/treatment/procedure(s) were performed by non-physician practitioner and as supervising physician I was immediately available for consultation/collaboration.  EKG Interpretation   None         Tarra Pence, MD 11/04/13 2312 
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
## 2013-11-20 ENCOUNTER — Emergency Department (HOSPITAL_COMMUNITY): Payer: Medicaid Other

## 2013-11-20 ENCOUNTER — Encounter (HOSPITAL_COMMUNITY): Payer: Self-pay | Admitting: Emergency Medicine

## 2013-11-20 ENCOUNTER — Emergency Department (HOSPITAL_COMMUNITY)
Admission: EM | Admit: 2013-11-20 | Discharge: 2013-11-20 | Disposition: A | Discharge status: Home / Self Care | Payer: Medicaid Other | Attending: Emergency Medicine | Admitting: Emergency Medicine

## 2013-11-20 DIAGNOSIS — M2550 Pain in unspecified joint: Secondary | ICD-10-CM

## 2013-11-20 DIAGNOSIS — IMO0002 Reserved for concepts with insufficient information to code with codable children: Secondary | ICD-10-CM | POA: Insufficient documentation

## 2013-11-20 DIAGNOSIS — G8911 Acute pain due to trauma: Secondary | ICD-10-CM | POA: Insufficient documentation

## 2013-11-20 DIAGNOSIS — M79609 Pain in unspecified limb: Secondary | ICD-10-CM | POA: Insufficient documentation

## 2013-11-20 DIAGNOSIS — Z8659 Personal history of other mental and behavioral disorders: Secondary | ICD-10-CM | POA: Insufficient documentation

## 2013-11-20 DIAGNOSIS — Z880 Allergy status to penicillin: Secondary | ICD-10-CM | POA: Insufficient documentation

## 2013-11-20 DIAGNOSIS — M2559 Pain in other specified joint: Secondary | ICD-10-CM | POA: Insufficient documentation

## 2013-11-20 DIAGNOSIS — Z79899 Other long term (current) drug therapy: Secondary | ICD-10-CM | POA: Insufficient documentation

## 2013-11-20 DIAGNOSIS — M549 Dorsalgia, unspecified: Secondary | ICD-10-CM | POA: Insufficient documentation

## 2013-11-20 DIAGNOSIS — S9030XA Contusion of unspecified foot, initial encounter: Secondary | ICD-10-CM

## 2013-11-20 MED ORDER — ACETAMINOPHEN 325 MG PO TABS
650.0000 mg | ORAL_TABLET | Freq: Once | ORAL | Status: AC
  Administered 2013-11-20: 650 mg via ORAL
  Filled 2013-11-20: qty 2

## 2013-11-20 MED ORDER — DIAZEPAM 5 MG PO TABS
5.0000 mg | ORAL_TABLET | Freq: Once | ORAL | Status: AC
  Administered 2013-11-20: 5 mg via ORAL
  Filled 2013-11-20: qty 1

## 2013-11-20 MED ORDER — KETOROLAC TROMETHAMINE 10 MG PO TABS
10.0000 mg | ORAL_TABLET | Freq: Once | ORAL | Status: AC
Start: 2013-11-20 — End: 2013-11-20
  Administered 2013-11-20: 10 mg via ORAL
  Filled 2013-11-20: qty 1

## 2013-11-20 MED ORDER — DICLOFENAC SODIUM 75 MG PO TBEC: 75.0000 mg | DELAYED_RELEASE_TABLET | Freq: Two times a day (BID) | ORAL | Status: DC
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
## 2013-11-20 NOTE — ED Provider Notes (Signed)
Medical screening examination/treatment/procedure(s) were performed by non-physician practitioner and as supervising physician I was immediately available for consultation/collaboration.  EKG Interpretation   None         Dwayna Kentner L Manvir Prabhu, MD 11/20/13 2235 

## 2013-11-20 NOTE — Discharge Instructions (Signed)
Repeat xrays are negative for fracture or dislocation.  Foot Contusion A foot contusion is a deep bruise to the foot. Contusions are the result of an injury that caused bleeding under the skin. The contusion may turn blue, purple, or yellow. Minor injuries will give you a painless contusion, but more severe contusions may stay painful and swollen for a few weeks. CAUSES  A foot contusion comes from a direct blow to that area, such as a heavy object falling on the foot. SYMPTOMS   Swelling of the foot.  Discoloration of the foot.  Tenderness or soreness of the foot. DIAGNOSIS  You will have a physical exam and will be asked about your history. You may need an X-ray of your foot to look for a broken bone (fracture).  TREATMENT  An elastic wrap may be recommended to support your foot. Resting, elevating, and applying cold compresses to your foot are often the best treatments for a foot contusion. Over-the-counter medicines may also be recommended for pain control. HOME CARE INSTRUCTIONS   Put ice on the injured area.  Put ice in a plastic bag.  Place a towel between your skin and the bag.  Leave the ice on for 15-20 minutes, 03-04 times a day.  Only take over-the-counter or prescription medicines for pain, discomfort, or fever as directed by your caregiver.  If told, use an elastic wrap as directed. This can help reduce swelling. You may remove the wrap for sleeping, showering, and bathing. If your toes become numb, cold, or blue, take the wrap off and reapply it more loosely.  Elevate your foot with pillows to reduce swelling.  Try to avoid standing or walking while the foot is painful. Do not resume use until instructed by your caregiver. Then, begin use gradually. If pain develops, decrease use. Gradually increase activities that do not cause discomfort until you have normal use of your foot.  See your caregiver as directed. It is very important to keep all follow-up appointments in  order to avoid any lasting problems with your foot, including long-term (chronic) pain. SEEK IMMEDIATE MEDICAL CARE IF:   You have increased redness, swelling, or pain in your foot.  Your swelling or pain is not relieved with medicines.  You have loss of feeling in your foot or are unable to move your toes.  Your foot turns cold or blue.  You have pain when you move your toes.  Your foot becomes warm to the touch.  Your contusion does not improve in 2 days. MAKE SURE YOU:   Understand these instructions.  Will watch your condition.  Will get help right away if you are not doing well or get worse. Document Released: 07/27/2006 Document Revised: 04/05/2012 Document Reviewed: 09/08/2011 [facility] Patient Information 2014 [place]ExitCare, [place]. Please stay off of your foot as much as possible. Keep the right foot elevated above your waist. Please see Dr Hilda LiasKeeling for orthopedic evaluation and management of your foot.
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
## 2013-11-21 ENCOUNTER — Emergency Department (HOSPITAL_COMMUNITY)
Admission: EM | Admit: 2013-11-21 | Discharge: 2013-11-21 | Disposition: A | Discharge status: Home / Self Care | Payer: Medicaid Other | Attending: Emergency Medicine | Admitting: Emergency Medicine

## 2013-11-21 ENCOUNTER — Other Ambulatory Visit: Payer: Self-pay

## 2013-11-21 ENCOUNTER — Encounter (HOSPITAL_COMMUNITY): Payer: Self-pay | Admitting: Emergency Medicine

## 2013-11-21 ENCOUNTER — Emergency Department (HOSPITAL_COMMUNITY): Payer: Medicaid Other

## 2013-11-21 DIAGNOSIS — F319 Bipolar disorder, unspecified: Secondary | ICD-10-CM

## 2013-11-21 DIAGNOSIS — IMO0002 Reserved for concepts with insufficient information to code with codable children: Secondary | ICD-10-CM | POA: Insufficient documentation

## 2013-11-21 DIAGNOSIS — R079 Chest pain, unspecified: Secondary | ICD-10-CM | POA: Insufficient documentation

## 2013-11-21 DIAGNOSIS — Z880 Allergy status to penicillin: Secondary | ICD-10-CM | POA: Insufficient documentation

## 2013-11-21 DIAGNOSIS — R109 Unspecified abdominal pain: Secondary | ICD-10-CM

## 2013-11-21 DIAGNOSIS — R55 Syncope and collapse: Secondary | ICD-10-CM | POA: Insufficient documentation

## 2013-11-21 DIAGNOSIS — Z791 Long term (current) use of non-steroidal anti-inflammatories (NSAID): Secondary | ICD-10-CM | POA: Insufficient documentation

## 2013-11-21 DIAGNOSIS — I498 Other specified cardiac arrhythmias: Secondary | ICD-10-CM | POA: Insufficient documentation

## 2013-11-21 DIAGNOSIS — R001 Bradycardia, unspecified: Secondary | ICD-10-CM

## 2013-11-21 DIAGNOSIS — F909 Attention-deficit hyperactivity disorder, unspecified type: Secondary | ICD-10-CM | POA: Insufficient documentation

## 2013-11-21 DIAGNOSIS — Z79899 Other long term (current) drug therapy: Secondary | ICD-10-CM | POA: Insufficient documentation

## 2013-11-21 DIAGNOSIS — R5381 Other malaise: Secondary | ICD-10-CM | POA: Insufficient documentation

## 2013-11-21 DIAGNOSIS — R5383 Other fatigue: Secondary | ICD-10-CM

## 2013-11-21 LAB — CBC WITH DIFFERENTIAL/PLATELET
Basophils Absolute: 0 10*3/uL (ref 0.0–0.1)
Basophils Relative: 0 % (ref 0–1)
EOS ABS: 0.7 10*3/uL (ref 0.0–0.7)
EOS PCT: 6 % — AB (ref 0–5)
HCT: 32 % — ABNORMAL LOW (ref 39.0–52.0)
Hemoglobin: 10.3 g/dL — ABNORMAL LOW (ref 13.0–17.0)
LYMPHS ABS: 1.7 10*3/uL (ref 0.7–4.0)
Lymphocytes Relative: 15 % (ref 12–46)
MCH: 20.4 pg — ABNORMAL LOW (ref 26.0–34.0)
MCHC: 32.2 g/dL (ref 30.0–36.0)
MCV: 63.5 fL — ABNORMAL LOW (ref 78.0–100.0)
Monocytes Absolute: 0.7 10*3/uL (ref 0.1–1.0)
Monocytes Relative: 6 % (ref 3–12)
NEUTROS PCT: 73 % (ref 43–77)
Neutro Abs: 8 10*3/uL — ABNORMAL HIGH (ref 1.7–7.7)
PLATELETS: 229 10*3/uL (ref 150–400)
RBC: 5.04 MIL/uL (ref 4.22–5.81)
RDW: 15.3 % (ref 11.5–15.5)
WBC: 11.1 10*3/uL — ABNORMAL HIGH (ref 4.0–10.5)

## 2013-11-21 LAB — COMPREHENSIVE METABOLIC PANEL
ALBUMIN: 4.4 g/dL (ref 3.5–5.2)
ALT: 13 U/L (ref 0–53)
AST: 17 U/L (ref 0–37)
Alkaline Phosphatase: 74 U/L (ref 39–117)
BUN: 9 mg/dL (ref 6–23)
CO2: 26 mEq/L (ref 19–32)
CREATININE: 0.97 mg/dL (ref 0.50–1.35)
Calcium: 9.7 mg/dL (ref 8.4–10.5)
Chloride: 104 mEq/L (ref 96–112)
GFR calc Af Amer: >90 mL/min (ref >90)
Glucose, Bld: 82 mg/dL (ref 70–99)
Potassium: 4.5 mEq/L (ref 3.7–5.3)
SODIUM: 141 meq/L (ref 137–147)
TOTAL PROTEIN: 7.1 g/dL (ref 6.0–8.3)
Total Bilirubin: 1.1 mg/dL (ref 0.3–1.2)

## 2013-11-21 LAB — URINALYSIS, ROUTINE W REFLEX MICROSCOPIC
Bilirubin Urine: NEGATIVE
GLUCOSE, UA: NEGATIVE mg/dL
Hgb urine dipstick: NEGATIVE
KETONES UR: NEGATIVE mg/dL
LEUKOCYTES UA: NEGATIVE
NITRITE: NEGATIVE
PH: 7 (ref 5.0–8.0)
Protein, ur: NEGATIVE mg/dL
Urobilinogen, UA: 0.2 mg/dL (ref 0.0–1.0)

## 2013-11-21 LAB — TROPONIN I: Troponin I: <0.3 ng/mL (ref <0.30)

## 2013-11-21 LAB — LITHIUM LEVEL: LITHIUM LVL: 1 meq/L (ref 0.80–1.40)

## 2013-11-21 LAB — LACTIC ACID, PLASMA: LACTIC ACID, VENOUS: 0.9 mmol/L (ref 0.5–2.2)

## 2013-11-21 LAB — LIPASE, BLOOD: Lipase: 15 U/L (ref 11–59)

## 2013-11-21 MED ORDER — SODIUM CHLORIDE 0.9 % IV BOLUS (SEPSIS)
1000.0000 mL | Freq: Once | INTRAVENOUS | Status: AC
  Administered 2013-11-21: 1000 mL via INTRAVENOUS

## 2013-11-21 MED ORDER — IOHEXOL 300 MG/ML  SOLN
50.0000 mL | Freq: Once | INTRAMUSCULAR | Status: AC | PRN
  Administered 2013-11-21: 50 mL via ORAL

## 2013-11-21 MED ORDER — IOHEXOL 300 MG/ML  SOLN
100.0000 mL | Freq: Once | INTRAMUSCULAR | Status: AC | PRN
  Administered 2013-11-21: 100 mL via INTRAVENOUS

## 2013-11-21 NOTE — ED Provider Notes (Signed)
CSN: [id number]     Arrival date & time 11/21/13  1624 History  This chart was scribed for Eugene Crease, MD by Ardelia Mems, ED Scribe. This patient was seen in room APA14/[facility]14 and the patient's care was started at 5:20 PM.   Chief Complaint  Patient presents with  . Dizziness  . Fatigue    The history is provided by the patient. No language interpreter was used.    HPI Comments: Eugene Hicks is a 23 y.o. male accompanied by father to the Emergency Department complaining of dizziness onset while sweeping earlier today. He reports that he also had associated generalized abdominal pain onset at the time of his dizziness. He states that this dizziness caused him to fall, but that he did not sustain any injuries form the fall. He states that symptoms have improved since onset but have not fully resolved. He denies chest pain or any other symptoms occurring before or after his dizziness/abdominal pain/fall today. Father reports that pt took his prescribed medications as per usual this morning- including an ADHD medication, Lithium carbonate, Propanolol and his morning dose of Xanax. Pt also takes Seroquel in the afternoon but hadn't had this yet today.     Past Medical History  Diagnosis Date  . ADHD (attention deficit hyperactivity disorder)   . Back pain   . Shoulder pain    History reviewed. No pertinent past surgical history. Family History  Problem Relation Age of Onset  . Stroke Father   . Seizures Mother   . Seizures Brother    History  Substance Use Topics  . Smoking status: Never Smoker   . Smokeless tobacco: Never Used  . Alcohol Use: No    Review of Systems  Cardiovascular: Negative for chest pain.  Gastrointestinal: Positive for abdominal pain.  Neurological: Positive for dizziness.  All other systems reviewed and are negative.   Allergies  Codeine; Lactose intolerance (gi); Percocet; and Penicillins  Home Medications   Current Outpatient Rx  Name   Route  Sig  Dispense  Refill  . alprazolam (XANAX) 2 MG tablet   Oral   Take 2 mg by mouth 4 (four) times daily as needed. For anxiety         . diclofenac (VOLTAREN) 75 MG EC tablet   Oral   Take 1 tablet (75 mg total) by mouth 2 (two) times daily.   12 tablet   0   . fluticasone (FLONASE) 50 MCG/ACT nasal spray   Nasal   Place 2 sprays into the nose daily.           . hydrOXYzine (ATARAX/VISTARIL) 25 MG tablet   Oral   Take 2 tablets (50 mg total) by mouth at bedtime as needed for itching.   12 tablet   0   . lithium carbonate 300 MG capsule   Oral   Take 300-600 mg by mouth 2 (two) times daily. Take one capsule (300mg ) every day in the morning, and take two capsules (600mg ) every day at bedtime         . propranolol (INNOPRAN XL) 80 MG 24 hr capsule   Oral   Take 80 mg by mouth daily.          . QUEtiapine (SEROQUEL) 300 MG tablet   Oral   Take 300 mg by mouth every evening.          . ranitidine (ZANTAC) 150 MG capsule   Oral   Take 1 capsule (150 mg  total) by mouth daily.   15 capsule   0   . EPINEPHrine (EPI-PEN) 0.3 mg/0.3 mL SOAJ injection   Intramuscular   Inject 0.3 mLs (0.3 mg total) into the muscle once.   1 Device   2    Triage Vitals: BP 100/50  Pulse 42  Temp(Src) 97.8 F (36.6 C) (Oral)  Ht 5\' 8"  (1.727 m)  Wt 129 lb (58.514 kg)  BMI 19.62 kg/m2  SpO2 100%  Physical Exam  Constitutional: He is oriented to person, place, and time. He appears well-developed and well-nourished. No distress.  HENT:  Head: Normocephalic and atraumatic.  Right Ear: Hearing normal.  Left Ear: Hearing normal.  Nose: Nose normal.  Mouth/Throat: Oropharynx is clear and moist and mucous membranes are normal.  Eyes: Conjunctivae and EOM are normal. Pupils are equal, round, and reactive to light.  Neck: Normal range of motion. Neck supple.  Cardiovascular: Regular rhythm, S1 normal and S2 normal.  Exam reveals no gallop and no friction rub.   No murmur  heard. Bradycardic.  Pulmonary/Chest: Effort normal and breath sounds normal. No respiratory distress. He exhibits no tenderness.  Abdominal: Soft. Normal appearance and bowel sounds are normal. There is no hepatosplenomegaly. There is tenderness. There is no rebound, no guarding, no tenderness at McBurney's point and negative Murphy's sign. No hernia.  Mild tenderness, mostly on the right side of the abdomen.  Musculoskeletal: Normal range of motion.  Neurological: He is alert and oriented to person, place, and time. He has normal strength. No cranial nerve deficit or sensory deficit. Coordination normal. GCS eye subscore is 4. GCS verbal subscore is 5. GCS motor subscore is 6.  Skin: Skin is warm, dry and intact. No rash noted. No cyanosis.  Psychiatric: He has a normal mood and affect. His speech is normal and behavior is normal. Thought content normal.    ED Course  Procedures (including critical care time)  DIAGNOSTIC STUDIES: Oxygen Saturation is 100% on RA, normal by my interpretation.    COORDINATION OF CARE: 5:23 PM- Will order IV fluids. Discussed plan to obtain diagnostic lab work. Pt and father advised of plan for treatment and pt and father agree.  Medications  sodium chloride 0.9 % bolus 1,000 mL (1,000 mLs Intravenous New Bag/Given 11/21/13 1757)   Labs Review Labs Reviewed  COMPREHENSIVE METABOLIC PANEL  LIPASE, BLOOD  LACTIC ACID, PLASMA  LITHIUM LEVEL  CBC WITH DIFFERENTIAL  URINALYSIS, ROUTINE W REFLEX MICROSCOPIC  TROPONIN I   Imaging Review Dg Foot Complete Right  11/20/2013   CLINICAL DATA:  Continued pain since a crush injury 3 weeks ago.  EXAM: RIGHT FOOT COMPLETE - 3+ VIEW  COMPARISON:  11/03/2013.  FINDINGS: The bones soft tissues continues to have normal appearances. No fracture or dislocation seen.  IMPRESSION: Normal examination, unchanged.   Electronically Signed   By: Gordan Payment M.D.   On: 11/20/2013 18:59    EKG Interpretation   None       EKG#1  Date: 11/21/2013  Rate: 42  Rhythm: sinus bradycardia and sinus arrhythmia  QRS Axis: normal  Intervals: normal  ST/T Wave abnormalities: normal  Conduction Disutrbances:none  Narrative Interpretation:   Old EKG Reviewed: none available  EKG#2  Date: 11/21/2013  Rate: 51  Rhythm: sinus bradycardia and sinus arrhythmia  QRS Axis: normal  Intervals: normal  ST/T Wave abnormalities: normal  Conduction Disutrbances:none  Narrative Interpretation:   Old EKG Reviewed: unchanged from initial EKG  MDM  Diagnosis: 1. Near-syncope 2.  Bradycardia 3. Abd Pain  Patient presented with dizziness, feeling like he might pass out. Symptoms began while he was sleeping. He says he started to have abdominal pain around the same time. His examination revealed bradycardia. Review of his labs reveals that he is on propanolol, likely the cause of the bradycardia.   Labwork was unremarkable. This included lactic acid, lithium level. While he was here he started to complain of some pain in his chest. An EKG was performed on arrival because of the near-syncope. He was repeatedly when he had chest pain and was unchanged. A troponin was also performed. Patient has no cardiac risk factors.  Patient improved while here. He was administered IV fluids, no other interventions. His abdominal pain has resolved. Repeat examination reveals that he is no longer dizzy and his abdominal exam is nontender. A CAT scan was performed and did show a small amount of fluid in the pelvis which is abnormal. Etiology is unclear. As his pain is resolved and he appears well, he will be discharged. Follow was told to come to the ER if he has any further abdominal pain. He should hold his propanolol today and tomorrow. Followup with primary doctor.   I personally performed the services described in this documentation, which was scribed in my presence. The recorded information has been reviewed and is accurate.    Eugene Creasehristopher  J. Pollina, MD 11/21/13 2111

## 2013-11-21 NOTE — ED Notes (Signed)
Patient states he feels dehydrated. Patient reports feeling dizzy with generalized weakness. Denies any headaches, nausea, vomiting, or fevers.

## 2013-11-21 NOTE — Discharge Instructions (Signed)
Do not take propranolol for the next 2 days. Followup with your primary doctor in one or 2 days. Return to the ER for any increasing abdominal pain.  Abdominal Pain, Adult Many things can cause abdominal pain. Usually, abdominal pain is not caused by a disease and will improve without treatment. It can often be observed and treated at home. Your health care provider will do a physical exam and possibly order blood tests and X-rays to help determine the seriousness of your pain. However, in many cases, more time must pass before a clear cause of the pain can be found. Before that point, your health care provider may not know if you need more testing or further treatment. HOME CARE INSTRUCTIONS  Monitor your abdominal pain for any changes. The following actions may help to alleviate any discomfort you are experiencing:  Only take over-the-counter or prescription medicines as directed by your health care provider.  Do not take laxatives unless directed to do so by your health care provider.  Try a clear liquid diet (broth, tea, or water) as directed by your health care provider. Slowly move to a bland diet as tolerated. SEEK MEDICAL CARE IF:  You have unexplained abdominal pain.  You have abdominal pain associated with nausea or diarrhea.  You have pain when you urinate or have a bowel movement.  You experience abdominal pain that wakes you in the night.  You have abdominal pain that is worsened or improved by eating food.  You have abdominal pain that is worsened with eating fatty foods. SEEK IMMEDIATE MEDICAL CARE IF:   Your pain does not go away within 2 hours.  You have a fever.  You keep throwing up (vomiting).  Your pain is felt only in portions of the abdomen, such as the right side or the left lower portion of the abdomen.  You pass bloody or black tarry stools. MAKE SURE YOU:  Understand these instructions.   Will watch your condition.   Will get help right away if  you are not doing well or get worse.  Document Released: 07/15/2005 Document Revised: 07/26/2013 Document Reviewed: 06/14/2013 [facility] Patient Information 2014 [place], [place].   Bradycardia Bradycardia is a term for a heart rate (pulse) that, in adults, is slower than 60 beats per minute. A normal rate is 60 to 100 beats per minute. A heart rate below 60 beats per minute may be normal for some adults with healthy hearts. If the rate is too slow, the heart may have trouble pumping the volume of blood the body needs. If the heart rate gets too low, blood flow to the brain may be decreased and may make you feel lightheaded, dizzy, or faint. The heart has a natural pacemaker in the top of the heart called the SA node (sinoatrial or sinus node). This pacemaker sends out regular electrical signals to the muscle of the heart, telling the heart muscle when to beat (contract). The electrical signal travels from the upper parts of the heart (atria) through the AV node (atrioventricular node), to the lower chambers of the heart (ventricles). The ventricles squeeze, pumping the blood from your heart to your lungs and to the rest of your body. CAUSES   Problem with the heart's electrical system.  Problem with the heart's natural pacemaker.  Heart disease, damage, or infection.  Medications.  Problems with minerals and salts (electrolytes). SYMPTOMS   Fainting (syncope).  Fatigue and weakness.  Shortness of breath (dyspnea).  Chest pain (angina).  Drowsiness.  Confusion. DIAGNOSIS   An electrocardiogram (ECG) can help your caregiver determine the type of slow heart rate you have.  If the cause is not seen on an ECG, you may need to wear a heart monitor that records your heart rhythm for several hours or days.  Blood tests. TREATMENT   Electrolyte supplements.  Medications.  Withholding medication which is causing a slow heart rate.  Pacemaker placement. SEEK IMMEDIATE MEDICAL  CARE IF:   You feel lightheaded or faint.  You develop an irregular heart rate.  You feel chest pain or have trouble breathing. MAKE SURE YOU:   Understand these instructions.  Will watch your condition.  Will get help right away if you are not doing well or get worse. Document Released: 06/27/2002 Document Revised: 12/28/2011 Document Reviewed: 05/23/2008 [facility] Patient Information 2014 [place]

## 2013-11-25 ENCOUNTER — Emergency Department (HOSPITAL_COMMUNITY)
Admission: EM | Admit: 2013-11-25 | Discharge: 2013-11-25 | Disposition: A | Discharge status: Home / Self Care | Payer: Medicaid Other | Attending: Emergency Medicine | Admitting: Emergency Medicine

## 2013-11-25 ENCOUNTER — Encounter (HOSPITAL_COMMUNITY): Payer: Self-pay | Admitting: Emergency Medicine

## 2013-11-25 DIAGNOSIS — IMO0002 Reserved for concepts with insufficient information to code with codable children: Secondary | ICD-10-CM | POA: Insufficient documentation

## 2013-11-25 DIAGNOSIS — Z9889 Other specified postprocedural states: Secondary | ICD-10-CM | POA: Insufficient documentation

## 2013-11-25 DIAGNOSIS — Z791 Long term (current) use of non-steroidal anti-inflammatories (NSAID): Secondary | ICD-10-CM | POA: Insufficient documentation

## 2013-11-25 DIAGNOSIS — Z8659 Personal history of other mental and behavioral disorders: Secondary | ICD-10-CM | POA: Insufficient documentation

## 2013-11-25 DIAGNOSIS — R109 Unspecified abdominal pain: Secondary | ICD-10-CM

## 2013-11-25 DIAGNOSIS — R1033 Periumbilical pain: Secondary | ICD-10-CM | POA: Insufficient documentation

## 2013-11-25 DIAGNOSIS — Z79899 Other long term (current) drug therapy: Secondary | ICD-10-CM | POA: Insufficient documentation

## 2013-11-25 DIAGNOSIS — G8911 Acute pain due to trauma: Secondary | ICD-10-CM | POA: Insufficient documentation

## 2013-11-25 DIAGNOSIS — Z880 Allergy status to penicillin: Secondary | ICD-10-CM | POA: Insufficient documentation

## 2013-11-25 LAB — CBC WITH DIFFERENTIAL/PLATELET
Basophils Absolute: 0 10*3/uL (ref 0.0–0.1)
Basophils Relative: 1 % (ref 0–1)
Eosinophils Absolute: 0.5 10*3/uL (ref 0.0–0.7)
Eosinophils Relative: 7 % — ABNORMAL HIGH (ref 0–5)
HEMATOCRIT: 32.1 % — AB (ref 39.0–52.0)
HEMOGLOBIN: 10.4 g/dL — AB (ref 13.0–17.0)
Lymphocytes Relative: 23 % (ref 12–46)
Lymphs Abs: 1.7 10*3/uL (ref 0.7–4.0)
MCH: 20.5 pg — ABNORMAL LOW (ref 26.0–34.0)
MCHC: 32.4 g/dL (ref 30.0–36.0)
MCV: 63.3 fL — ABNORMAL LOW (ref 78.0–100.0)
Monocytes Absolute: 0.4 10*3/uL (ref 0.1–1.0)
Monocytes Relative: 5 % (ref 3–12)
NEUTROS ABS: 4.7 10*3/uL (ref 1.7–7.7)
NEUTROS PCT: 65 % (ref 43–77)
Platelets: 187 10*3/uL (ref 150–400)
RBC: 5.07 MIL/uL (ref 4.22–5.81)
RDW: 15.1 % (ref 11.5–15.5)
WBC: 7.4 10*3/uL (ref 4.0–10.5)

## 2013-11-25 LAB — COMPREHENSIVE METABOLIC PANEL
ALBUMIN: 4.4 g/dL (ref 3.5–5.2)
ALT: 17 U/L (ref 0–53)
AST: 19 U/L (ref 0–37)
Alkaline Phosphatase: 87 U/L (ref 39–117)
BUN: 6 mg/dL (ref 6–23)
CO2: 28 mEq/L (ref 19–32)
Calcium: 9.6 mg/dL (ref 8.4–10.5)
Chloride: 103 mEq/L (ref 96–112)
Creatinine, Ser: 0.81 mg/dL (ref 0.50–1.35)
GFR calc non Af Amer: >90 mL/min (ref >90)
Glucose, Bld: 110 mg/dL — ABNORMAL HIGH (ref 70–99)
Potassium: 4.2 mEq/L (ref 3.7–5.3)
SODIUM: 141 meq/L (ref 137–147)
TOTAL PROTEIN: 7.3 g/dL (ref 6.0–8.3)
Total Bilirubin: 0.5 mg/dL (ref 0.3–1.2)

## 2013-11-25 LAB — LIPASE, BLOOD: LIPASE: 21 U/L (ref 11–59)

## 2013-11-25 MED ORDER — DICYCLOMINE HCL 10 MG PO CAPS
10.0000 mg | ORAL_CAPSULE | Freq: Once | ORAL | Status: AC
  Administered 2013-11-25: 10 mg via ORAL
  Filled 2013-11-25: qty 1

## 2013-11-25 MED ORDER — DICYCLOMINE HCL 10 MG PO CAPS
10.0000 mg | ORAL_CAPSULE | Freq: Once | ORAL | Status: AC
Start: 2013-11-25 — End: 2013-11-25
  Administered 2013-11-25: 10 mg via ORAL
  Filled 2013-11-25: qty 1

## 2013-11-25 MED ORDER — DICYCLOMINE HCL 10 MG PO CAPS
10.0000 mg | ORAL_CAPSULE | Freq: Three times a day (TID) | ORAL | Status: DC | PRN
Start: 2013-11-25 — End: 2014-04-08

## 2013-11-25 NOTE — Discharge Instructions (Signed)
Abdominal Pain, Adult °Many things can cause abdominal pain. Usually, abdominal pain is not caused by a disease and will improve without treatment. It can often be observed and treated at home. Your health care provider will do a physical exam and possibly order blood tests and X-rays to help determine the seriousness of your pain. However, in many cases, more time must pass before a clear cause of the pain can be found. Before that point, your health care provider may not know if you need more testing or further treatment. °HOME CARE INSTRUCTIONS  °Monitor your abdominal pain for any changes. The following actions may help to alleviate any discomfort you are experiencing: °· Only take over-the-counter or prescription medicines as directed by your health care provider. °· Do not take laxatives unless directed to do so by your health care provider. °· Try a clear liquid diet (broth, tea, or water) as directed by your health care provider. Slowly move to a bland diet as tolerated. °SEEK MEDICAL CARE IF: °· You have unexplained abdominal pain. °· You have abdominal pain associated with nausea or diarrhea. °· You have pain when you urinate or have a bowel movement. °· You experience abdominal pain that wakes you in the night. °· You have abdominal pain that is worsened or improved by eating food. °· You have abdominal pain that is worsened with eating fatty foods. °SEEK IMMEDIATE MEDICAL CARE IF:  °· Your pain does not go away within 2 hours. °· You have a fever. °· You keep throwing up (vomiting). °· Your pain is felt only in portions of the abdomen, such as the right side or the left lower portion of the abdomen. °· You pass bloody or black tarry stools. °MAKE SURE YOU: °· Understand these instructions.   °· Will watch your condition.   °· Will get help right away if you are not doing well or get worse.   °Document Released: 07/15/2005 Document Revised: 07/26/2013 Document Reviewed: 06/14/2013 °[employer]® Patient  Information ©2014 [employer]. ° °

## 2013-11-25 NOTE — ED Notes (Signed)
Patient complaining of lower abdominal pain since December. Denies nausea, vomiting, and diarrhea.

## 2013-11-28 NOTE — ED Provider Notes (Signed)
Medical screening examination/treatment/procedure(s) were performed by non-physician practitioner and as supervising physician I was immediately available for consultation/collaboration.  EKG Interpretation   None         Mohmed Farver W. Jostin Rue, MD 11/28/13 1319 

## 2013-11-28 NOTE — ED Provider Notes (Signed)
CSN: [id number]     Arrival date & time 11/25/13  2048 History   First MD Initiated Contact with Patient 11/25/13 2111     Chief Complaint  Patient presents with  . Abdominal Pain     (Consider location/radiation/quality/duration/timing/severity/associated sxs/prior Treatment) HPI Comments: Eugene Hicks is a 23 y.o. Male presenting for evaluation of abdominal pain which has been intermittently present for the past 2 months.  Father at the bedside states it started after he fell out of a treestand, landing on hard ground from a height of about 10 feet.  He describes stabbing pain in the periumbilical area which waxes and wanes.  He had found no alleviators or stressors and denies nausea, vomiting, diarrhea, fevers or weakness. He was seen for this same complaint 4 days ago during which time his lab tests and abdominal/pelvic Ct were unrevealing for the source of pain.      The history is provided by the patient.    Past Medical History  Diagnosis Date  . ADHD (attention deficit hyperactivity disorder)   . Back pain   . Shoulder pain    Past Surgical History  Procedure Laterality Date  . Upper gi endoscopy     Family History  Problem Relation Age of Onset  . Stroke Father   . Seizures Mother   . Seizures Brother    History  Substance Use Topics  . Smoking status: Never Smoker   . Smokeless tobacco: Never Used  . Alcohol Use: No    Review of Systems  Constitutional: Negative for fever.  HENT: Negative for congestion and sore throat.   Eyes: Negative.   Respiratory: Negative for chest tightness and shortness of breath.   Cardiovascular: Negative for chest pain.  Gastrointestinal: Positive for abdominal pain. Negative for nausea.  Genitourinary: Negative.   Musculoskeletal: Negative for arthralgias, joint swelling and neck pain.  Skin: Negative.  Negative for rash and wound.  Neurological: Negative for dizziness, weakness, light-headedness, numbness and headaches.   Psychiatric/Behavioral: Negative.       Allergies  Codeine; Lactose intolerance (gi); Percocet; and Penicillins  Home Medications   Current Outpatient Rx  Name  Route  Sig  Dispense  Refill  . alprazolam (XANAX) 2 MG tablet   Oral   Take 2 mg by mouth 4 (four) times daily as needed. For anxiety         . diclofenac (VOLTAREN) 75 MG EC tablet   Oral   Take 1 tablet (75 mg total) by mouth 2 (two) times daily.   12 tablet   0   . fluticasone (FLONASE) 50 MCG/ACT nasal spray   Nasal   Place 2 sprays into the nose daily.           . hydrOXYzine (ATARAX/VISTARIL) 25 MG tablet   Oral   Take 2 tablets (50 mg total) by mouth at bedtime as needed for itching.   12 tablet   0   . lithium carbonate 300 MG capsule   Oral   Take 300-600 mg by mouth 2 (two) times daily. Take one capsule (300mg ) every day in the morning, and take two capsules (600mg ) every day at bedtime         . propranolol (INNOPRAN XL) 80 MG 24 hr capsule   Oral   Take 80 mg by mouth daily.          . QUEtiapine (SEROQUEL) 300 MG tablet   Oral   Take 300 mg by mouth every evening.          Marland Kitchen  ranitidine (ZANTAC) 150 MG capsule   Oral   Take 1 capsule (150 mg total) by mouth daily.   15 capsule   0   . dicyclomine (BENTYL) 10 MG capsule   Oral   Take 1-2 capsules (10-20 mg total) by mouth 3 (three) times daily as needed for spasms.   30 capsule   0   . EPINEPHrine (EPI-PEN) 0.3 mg/0.3 mL SOAJ injection   Intramuscular   Inject 0.3 mLs (0.3 mg total) into the muscle once.   1 Device   2    BP 103/62  Pulse 75  Temp(Src) 98 F (36.7 C) (Oral)  Resp 16  Ht 5\' 9"  (1.753 m)  Wt 139 lb (63.05 kg)  BMI 20.52 kg/m2  SpO2 99% Physical Exam  Nursing note and vitals reviewed. Constitutional: He appears well-developed and well-nourished.  HENT:  Head: Normocephalic and atraumatic.  Eyes: Conjunctivae are normal.  Neck: Normal range of motion.  Cardiovascular: Normal rate, regular  rhythm, normal heart sounds and intact distal pulses.   Pulmonary/Chest: Effort normal and breath sounds normal. He has no wheezes.  Abdominal: Soft. Bowel sounds are normal. He exhibits no distension and no mass. There is no hepatosplenomegaly. There is tenderness in the periumbilical area. There is no rigidity, no rebound and no guarding.  Musculoskeletal: Normal range of motion.  Neurological: He is alert.  Skin: Skin is warm and dry.  Psychiatric: He has a normal mood and affect.    ED Course  Procedures (including critical care time) Labs Review Labs Reviewed  COMPREHENSIVE METABOLIC PANEL - Abnormal; Notable for the following:    Glucose, Bld 110 (*)    All other components within normal limits  CBC WITH DIFFERENTIAL - Abnormal; Notable for the following:    Hemoglobin 10.4 (*)    HCT 32.1 (*)    MCV 63.3 (*)    MCH 20.5 (*)    Eosinophils Relative 7 (*)    All other components within normal limits  LIPASE, BLOOD   Imaging Review No results found.  EKG Interpretation   None       MDM   Final diagnoses:  Abdominal pain    Labs repeated tonight and stable,  Ct from 4 days ago reviewed.  Pt was given bentyl while awaiting test results with complete resolution of pain.  Suspect this could be colic/IBS.  He was prescribed additional bentyl, encouraged f/u with pcp prn.  No evidence for this being sequelae of trauma which was discussed with father as this was his biggest concern.    The patient appears reasonably screened and/or stabilized for discharge and I doubt any other medical condition or other [facility] requiring further screening, evaluation, or treatment in the ED at this time prior to discharge.     Burgess AmorJulie Jaylanni Eltringham, PA-C 11/28/13 1310

## 2013-11-29 ENCOUNTER — Encounter (HOSPITAL_COMMUNITY): Payer: Self-pay | Admitting: Emergency Medicine

## 2013-11-29 ENCOUNTER — Emergency Department (HOSPITAL_COMMUNITY): Payer: Medicaid Other

## 2013-11-29 ENCOUNTER — Emergency Department (HOSPITAL_COMMUNITY)
Admission: EM | Admit: 2013-11-29 | Discharge: 2013-11-29 | Disposition: A | Discharge status: Home / Self Care | Payer: Medicaid Other | Attending: Emergency Medicine | Admitting: Emergency Medicine

## 2013-11-29 DIAGNOSIS — Z880 Allergy status to penicillin: Secondary | ICD-10-CM | POA: Insufficient documentation

## 2013-11-29 DIAGNOSIS — Y92410 Unspecified street and highway as the place of occurrence of the external cause: Secondary | ICD-10-CM | POA: Insufficient documentation

## 2013-11-29 DIAGNOSIS — Z791 Long term (current) use of non-steroidal anti-inflammatories (NSAID): Secondary | ICD-10-CM | POA: Insufficient documentation

## 2013-11-29 DIAGNOSIS — IMO0002 Reserved for concepts with insufficient information to code with codable children: Secondary | ICD-10-CM | POA: Insufficient documentation

## 2013-11-29 DIAGNOSIS — Y9389 Activity, other specified: Secondary | ICD-10-CM | POA: Insufficient documentation

## 2013-11-29 DIAGNOSIS — Z8739 Personal history of other diseases of the musculoskeletal system and connective tissue: Secondary | ICD-10-CM | POA: Insufficient documentation

## 2013-11-29 DIAGNOSIS — S60519A Abrasion of unspecified hand, initial encounter: Secondary | ICD-10-CM

## 2013-11-29 DIAGNOSIS — Z8659 Personal history of other mental and behavioral disorders: Secondary | ICD-10-CM | POA: Insufficient documentation

## 2013-11-29 DIAGNOSIS — Z79899 Other long term (current) drug therapy: Secondary | ICD-10-CM | POA: Insufficient documentation

## 2013-11-29 MED ORDER — BACITRACIN-NEOMYCIN-POLYMYXIN 400-5-5000 EX OINT
TOPICAL_OINTMENT | Freq: Once | CUTANEOUS | Status: AC
  Administered 2013-11-29: 20:00:00 via TOPICAL
  Filled 2013-11-29: qty 1

## 2013-11-29 MED ORDER — KETOROLAC TROMETHAMINE 60 MG/2ML IM SOLN
60.0000 mg | Freq: Once | INTRAMUSCULAR | Status: AC
  Administered 2013-11-29: 60 mg via INTRAMUSCULAR
  Filled 2013-11-29: qty 2
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
## 2013-11-29 NOTE — ED Provider Notes (Signed)
Medical screening examination/treatment/procedure(s) were performed by non-physician practitioner and as supervising physician I was immediately available for consultation/collaboration.  EKG Interpretation   None        Rhiannan Kievit R. Brucha Ahlquist, MD 11/29/13 2343 
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
## 2013-11-29 NOTE — ED Notes (Signed)
Cleaned hands up with surclens and applied neosporin.

## 2013-11-29 NOTE — Discharge Instructions (Signed)
Abrasion °An abrasion is a cut or scrape of the skin. Abrasions do not extend through all layers of the skin and most heal within 10 days. It is important to care for your abrasion properly to prevent infection. °CAUSES  °Most abrasions are caused by falling on, or gliding across, the ground or other surface. When your skin rubs on something, the outer and inner layer of skin rubs off, causing an abrasion. °DIAGNOSIS  °Your caregiver will be able to diagnose an abrasion during a physical exam.  °TREATMENT  °Your treatment depends on how large and deep the abrasion is. Generally, your abrasion will be cleaned with water and a mild soap to remove any dirt or debris. An antibiotic ointment may be put over the abrasion to prevent an infection. A bandage (dressing) may be wrapped around the abrasion to keep it from getting dirty.  °You may need a tetanus shot if: °· You cannot remember when you had your last tetanus shot. °· You have never had a tetanus shot. °· The injury broke your skin. °If you get a tetanus shot, your arm may swell, get red, and feel warm to the touch. This is common and not a problem. If you need a tetanus shot and you choose not to have one, there is a rare chance of getting tetanus. Sickness from tetanus can be serious.  °HOME CARE INSTRUCTIONS  °· If a dressing was applied, change it at least once a day or as directed by your caregiver. If the bandage sticks, soak it off with warm water.   °· Wash the area with water and a mild soap to remove all the ointment 2 times a day. Rinse off the soap and pat the area dry with a clean towel.   °· Reapply any ointment as directed by your caregiver. This will help prevent infection and keep the bandage from sticking. Use gauze over the wound and under the dressing to help keep the bandage from sticking.   °· Change your dressing right away if it becomes wet or dirty.   °· Only take over-the-counter or prescription medicines for pain, discomfort, or fever as  directed by your caregiver.   °· Follow up with your caregiver within 24 48 hours for a wound check, or as directed. If you were not given a wound-check appointment, look closely at your abrasion for redness, swelling, or pus. These are signs of infection. °SEEK IMMEDIATE MEDICAL CARE IF:  °· You have increasing pain in the wound.   °· You have redness, swelling, or tenderness around the wound.   °· You have pus coming from the wound.   °· You have a fever or persistent symptoms for more than 2 3 days. °· You have a fever and your symptoms suddenly get worse. °· You have a bad smell coming from the wound or dressing.   °MAKE SURE YOU:  °· Understand these instructions. °· Will watch your condition. °· Will get help right away if you are not doing well or get worse. °Document Released: 07/15/2005 Document Revised: 09/21/2012 Document Reviewed: 09/08/2011 °[employer]® Patient Information ©2014 [employer]. ° °

## 2013-12-02 ENCOUNTER — Emergency Department (HOSPITAL_COMMUNITY)
Admission: EM | Admit: 2013-12-02 | Discharge: 2013-12-02 | Disposition: A | Discharge status: Home / Self Care | Payer: Medicaid Other | Attending: Emergency Medicine | Admitting: Emergency Medicine

## 2013-12-02 ENCOUNTER — Encounter (HOSPITAL_COMMUNITY): Payer: Self-pay | Admitting: Emergency Medicine

## 2013-12-02 DIAGNOSIS — Z791 Long term (current) use of non-steroidal anti-inflammatories (NSAID): Secondary | ICD-10-CM | POA: Insufficient documentation

## 2013-12-02 DIAGNOSIS — Z8659 Personal history of other mental and behavioral disorders: Secondary | ICD-10-CM | POA: Insufficient documentation

## 2013-12-02 DIAGNOSIS — IMO0002 Reserved for concepts with insufficient information to code with codable children: Secondary | ICD-10-CM | POA: Insufficient documentation

## 2013-12-02 DIAGNOSIS — Z79899 Other long term (current) drug therapy: Secondary | ICD-10-CM | POA: Insufficient documentation

## 2013-12-02 DIAGNOSIS — K644 Residual hemorrhoidal skin tags: Secondary | ICD-10-CM

## 2013-12-02 DIAGNOSIS — Z880 Allergy status to penicillin: Secondary | ICD-10-CM | POA: Insufficient documentation

## 2013-12-02 DIAGNOSIS — K625 Hemorrhage of anus and rectum: Secondary | ICD-10-CM

## 2013-12-02 NOTE — ED Provider Notes (Signed)
TIME SEEN: 6:08 PM  CHIEF COMPLAINT:   HPI: HPI Comments: Eugene Hicks is a 23 y.o. male who presents to the Emergency Department complaining of hematochezia, characterized as bright-red bloood, which occurred twice today and which the pt visualized in the toilet.  The pt's father states the pt was performing heavy lifting today prior to the episodes. The pt also reports rectal pain worse with BM. He denies diarrhea, hematemesis, or black stool. He also denies anal intercourse or rectal foreign bodies.  The pt reports a prior episode of hematochezia several years ago but he can not recall why.  States he does not think he has had a colonoscopy.  No on anticoagulation.  He is allergic to codeine; he has hives as a reaction.   ROS: See HPI Constitutional: no fever  Eyes: no drainage  ENT: no runny nose   Cardiovascular:  no chest pain  Resp: no SOB  GI: no vomiting, no hematemesis,  GU: positive rectal pain, positive hematochezia, no dysuria, no diarrhea Integumentary: no rash  Allergy: no hives  Musculoskeletal: no leg swelling  Neurological: no slurred speech ROS otherwise negative  PAST MEDICAL HISTORY/PAST SURGICAL HISTORY:  Past Medical History  Diagnosis Date  . ADHD (attention deficit hyperactivity disorder)   . Back pain   . Shoulder pain     MEDICATIONS:  Prior to Admission medications   Medication Sig Start Date End Date Taking? Authorizing Provider  alprazolam Prudy Feeler) 2 MG tablet Take 2 mg by mouth 4 (four) times daily as needed. For anxiety   Yes Historical Provider, MD  diclofenac (VOLTAREN) 75 MG EC tablet Take 1 tablet (75 mg total) by mouth 2 (two) times daily. 11/20/13  Yes Kathie Dike, PA-C  dicyclomine (BENTYL) 10 MG capsule Take 1-2 capsules (10-20 mg total) by mouth 3 (three) times daily as needed for spasms. 11/25/13  Yes Raynelle Fanning Idol, PA-C  fluticasone (FLONASE) 50 MCG/ACT nasal spray Place 2 sprays into the nose daily.     Yes Historical Provider, MD   hydrOXYzine (ATARAX/VISTARIL) 25 MG tablet Take 2 tablets (50 mg total) by mouth at bedtime as needed for itching. 06/17/13  Yes Sunnie Nielsen, MD  lithium carbonate 300 MG capsule Take 300-600 mg by mouth 2 (two) times daily. Take one capsule (300mg ) every day in the morning, and take two capsules (600mg ) every day at bedtime   Yes Historical Provider, MD  propranolol (INNOPRAN XL) 80 MG 24 hr capsule Take 80 mg by mouth daily.    Yes Historical Provider, MD  QUEtiapine (SEROQUEL) 300 MG tablet Take 300 mg by mouth every evening.    Yes Historical Provider, MD  ranitidine (ZANTAC) 150 MG capsule Take 1 capsule (150 mg total) by mouth daily. 06/13/13  Yes Benny Lennert, MD  EPINEPHrine (EPI-PEN) 0.3 mg/0.3 mL SOAJ injection Inject 0.3 mLs (0.3 mg total) into the muscle once. 07/26/13   Donnetta Hutching, MD    ALLERGIES:  Allergies  Allergen Reactions  . Codeine Hives  . Lactose Intolerance (Gi) Other (See Comments)    Upset Stomach  . Percocet [Oxycodone-Acetaminophen] Other (See Comments)    Rash and itching  . Penicillins Rash    SOCIAL HISTORY:  History  Substance Use Topics  . Smoking status: Never Smoker   . Smokeless tobacco: Never Used  . Alcohol Use: No    FAMILY HISTORY: Family History  Problem Relation Age of Onset  . Stroke Father   . Seizures Mother   . Seizures  Brother     EXAM: BP 108/66  Pulse 66  Temp(Src) 97.9 F (36.6 C) (Oral)  Resp 16  Ht 5\' 6"  (1.676 m)  Wt 130 lb (58.968 kg)  BMI 20.99 kg/m2  SpO2 99% CONSTITUTIONAL: Alert and oriented and responds appropriately to questions. Well-appearing; well-nourished HEAD: Normocephalic EYES: Conjunctivae clear, PERRL; no conjuctival pallor ENT: normal nose; no rhinorrhea; moist mucous membranes; pharynx without lesions noted NECK: Supple, no meningismus, no LAD  CARD: RRR; S1 and S2 appreciated; no murmurs, no clicks, no rubs, no gallops RESP: Normal chest excursion without splinting or tachypnea; breath sounds  clear and equal bilaterally; no wheezes, no rhonchi, no rales,  ABD/GI: Normal bowel sounds; non-distended; soft, non-tender, no rebound, no guarding BACK:  The back appears normal and is non-tender to palpation, there is no CVA tenderness EXT: Normal ROM in all joints; non-tender to palpation; no edema; normal capillary refill; no cyanosis    SKIN: Normal color for age and race; warm RECTAL: Normal tone, 1 small external nonthrombosed hemorrhoid,  no melena, no gross blood, guaiac negative NEURO: Moves all extremities equally PSYCH: The patient's mood and manner are appropriate. Grooming and personal hygiene are appropriate.  MEDICAL DECISION MAKING:   6:18 PM-  Patient here with external hemorrhoid. He has no blood or melena on his rectal exam currently. His abdominal exam is benign. He is hemodynamically stable. I feel he is safe to be discharged home. Do not feel he needs further workup at this time. Discussed with pt best practices for treating hemorrhoid - using preparation H, Tucks pads. Encouraged pt to drink plenty of fluids, eat food high in fiber.  Pt verbalized understanding and is comfortable with plan.   I personally performed the services described in this documentation, which was scribed in my presence. The recorded information has been reviewed and is accurate.     Layla MawKristen N Yordan Martindale, DO 12/02/13 1824

## 2013-12-02 NOTE — ED Notes (Signed)
Pt states he had bright red blood in his stool x 2 at 1330 and at 1620.

## 2013-12-02 NOTE — Discharge Instructions (Signed)
Bloody Stools Bloody stools often mean that there is a problem in the digestive tract. Your caregiver may use the term "melena" to describe black, tarry, and bad smelling stools or "hematochezia" to describe red or maroon-colored stools. Blood seen in the stool can be caused by bleeding anywhere along the intestinal tract.  A black stool usually means that blood is coming from the upper part of the gastrointestinal tract (esophagus, stomach, or small bowel). Passing maroon-colored stools or bright red blood usually means that blood is coming from lower down in the large bowel or the rectum. However, sometimes massive bleeding in the stomach or small intestine can cause bright red bloody stools.  Consuming black licorice, lead, iron pills, medicines containing bismuth subsalicylate, or blueberries can also cause black stools. Your caregiver can test black stools to see if blood is present. It is important that the cause of the bleeding be found. Treatment can then be started, and the problem can be corrected. Rectal bleeding may not be serious, but you should not assume everything is okay until you know the cause.It is very important to follow up with your caregiver or a specialist in gastrointestinal problems. CAUSES  Blood in the stools can come from various underlying causes.Often, the cause is not found during your first visit. Testing is often needed to discover the cause of bleeding in the gastrointestinal tract. Causes range from simple to serious or even life-threatening.Possible causes include:  Hemorrhoids.These are veins that are full of blood (engorged) in the rectum. They cause pain, inflammation, and may bleed.  Anal fissures.These are areas of painful tearing which may bleed. They are often caused by passing hard stool.  Diverticulosis.These are pouches that form on the colon over time, with age, and may bleed significantly.  Diverticulitis.This is inflammation in areas with  diverticulosis. It can cause pain, fever, and bloody stools, although bleeding is rare.  Proctitis and colitis. These are inflamed areas of the rectum or colon. They may cause pain, fever, and bloody stools.  Polyps and cancer. Colon cancer is a leading cause of preventable cancer death.It often starts out as precancerous polyps that can be removed during a colonoscopy, preventing progression into cancer. Sometimes, polyps and cancer may cause rectal bleeding.  Gastritis and ulcers.Bleeding from the upper gastrointestinal tract (near the stomach) may travel through the intestines and produce black, sometimes tarry, often bad smelling stools. In certain cases, if the bleeding is fast enough, the stools may not be black, but red and the condition may be life-threatening. SYMPTOMS  You may have stools that are bright red and bloody, that are normal color with blood on them, or that are dark black and tarry. In some cases, you may only have blood in the toilet bowl. Any of these cases need medical care. You may also have:  Pain at the anus or anywhere in the rectum.  Lightheadedness or feeling faint.  Extreme weakness.  Nausea or vomiting.  Fever. DIAGNOSIS Your caregiver may use the following methods to find the cause of your bleeding:  Taking a medical history. Age is important. Older people tend to develop polyps and cancer more often. If there is anal pain and a hard, large stool associated with bleeding, a tear of the anus may be the cause. If blood drips into the toilet after a bowel movement, bleeding hemorrhoids may be the problem. The color and frequency of the bleeding are additional considerations. In most cases, the medical history provides clues, but seldom the final  answer.  A visual and finger (digital) exam. Your caregiver will inspect the anal area, looking for tears and hemorrhoids. A finger exam can provide information when there is tenderness or a growth inside. In men, the  prostate is also examined.  Endoscopy. Several types of small, long scopes (endoscopes) are used to view the colon.  In the office, your caregiver may use a rigid, or more commonly, a flexible viewing sigmoidoscope. This exam is called flexible sigmoidoscopy. It is performed in 5 to 10 minutes.  A more thorough exam is accomplished with a colonoscope. It allows your caregiver to view the entire 5 to 6 foot long colon. Medicine to help you relax (sedative) is usually given for this exam. Frequently, a bleeding lesion may be present beyond the reach of the sigmoidoscope. So, a colonoscopy may be the best exam to start with. Both exams are usually done on an outpatient basis. This means the patient does not stay overnight in the hospital or surgery center.  An upper endoscopy may be needed to examine your stomach. Sedation is used and a flexible endoscope is put in your mouth, down to your stomach.  A barium enema X-ray. This is an X-ray exam. It uses liquid barium inserted by enema into the rectum. This test alone may not identify an actual bleeding point. X-rays highlight abnormal shadows, such as those made by lumps (tumors), diverticuli, or colitis. TREATMENT  Treatment depends on the cause of your bleeding.   For bleeding from the stomach or colon, the caregiver doing your endoscopy or colonoscopy may be able to stop the bleeding as part of the procedure.  Inflammation or infection of the colon can be treated with medicines.  Many rectal problems can be treated with creams, suppositories, or warm baths.  Surgery is sometimes needed.  Blood transfusions are sometimes needed if you have lost a lot of blood.  For any bleeding problem, let your caregiver know if you take aspirin or other blood thinners regularly. HOME CARE INSTRUCTIONS   Take any medicines exactly as prescribed.  Keep your stools soft by eating a diet high in fiber. Prunes (1 to 3 a day) work well for many people.  Drink  enough water and fluids to keep your urine clear or pale yellow.  Take sitz baths if advised. A sitz bath is when you sit in a bathtub with warm water for 10 to 15 minutes to soak, soothe, and cleanse the rectal area.  If enemas or suppositories are advised, be sure you know how to use them. Tell your caregiver if you have problems with this.  Monitor your bowel movements to look for signs of improvement or worsening. SEEK MEDICAL CARE IF:   You do not improve in the time expected.  Your condition worsens after initial improvement.  You develop any new symptoms. SEEK IMMEDIATE MEDICAL CARE IF:   You develop severe or prolonged rectal bleeding.  You vomit blood.  You feel weak or faint.  You have a fever. MAKE SURE YOU:  Understand these instructions.  Will watch your condition.  Will get help right away if you are not doing well or get worse. Document Released: 09/25/2002 Document Revised: 12/28/2011 Document Reviewed: 02/20/2011 [facility] Patient Information 2014 [place], [place].  Fiber Content in Foods Drinking plenty of fluids and consuming foods high in fiber can help with constipation. See the list below for the fiber content of some common foods. Starches and Grains / Dietary Fiber (g)  Cheerios, 1 cup /  3 g  Kellogg's Corn Flakes, 1 cup / 0.7 g  Rice Krispies, 1  cup / 0.3 g  Quaker Oat Life Cereal,  cup / 2.1 g  Oatmeal, instant (cooked),  cup / 2 g  Kellogg's Frosted Mini Wheats, 1 cup / 5.1 g  Rice, brown, long-grain (cooked), 1 cup / 3.5 g  Rice, white, long-grain (cooked), 1 cup / 0.6 g  Macaroni, cooked, enriched, 1 cup / 2.5 g Legumes / Dietary Fiber (g)  Beans, baked, canned, plain or vegetarian,  cup / 5.2 g  Beans, kidney, canned,  cup / 6.8 g  Beans, pinto, dried (cooked),  cup / 7.7 g  Beans, pinto, canned,  cup / 5.5 g Breads and Crackers / Dietary Fiber (g)  Graham crackers, plain or honey, 2 squares / 0.7 g  Saltine  crackers, 3 squares / 0.3 g  Pretzels, plain, salted, 10 pieces / 1.8 g  Bread, whole-wheat, 1 slice / 1.9 g  Bread, white, 1 slice / 0.7 g  Bread, raisin, 1 slice / 1.2 g  Bagel, plain, 3 oz / 2 g  Tortilla, flour, 1 oz / 0.9 g  Tortilla, corn, 1 small / 1.5 g  Bun, hamburger or hotdog, 1 small / 0.9 g Fruits / Dietary Fiber (g)  Apple, raw with skin, 1 medium / 4.4 g  Applesauce, sweetened,  cup / 1.5 g  Banana,  medium / 1.5 g  Grapes, 10 grapes / 0.4 g  Orange, 1 small / 2.3 g  Raisin, 1.5 oz / 1.6 g  Melon, 1 cup / 1.4 g Vegetables / Dietary Fiber (g)  Green beans, canned,  cup / 1.3 g  Carrots (cooked),  cup / 2.3 g  Broccoli (cooked),  cup / 2.8 g  Peas, frozen (cooked),  cup / 4.4 g  Potatoes, mashed,  cup / 1.6 g  Lettuce, 1 cup / 0.5 g  Corn, canned,  cup / 1.6 g  Tomato,  cup / 1.1 g Document Released: 02/21/2007 Document Revised: 12/28/2011 Document Reviewed: 04/18/2007 [employer] Patient Information 2014 [place].  Hemorrhoids Hemorrhoids are swollen veins around the rectum or anus. There are two types of hemorrhoids:   Internal hemorrhoids. These occur in the veins just inside the rectum. They may poke through to the outside and become irritated and painful.  External hemorrhoids. These occur in the veins outside the anus and can be felt as a painful swelling or hard lump near the anus. CAUSES  Pregnancy.   Obesity.   Constipation or diarrhea.   Straining to have a bowel movement.   Sitting for long periods on the toilet.  Heavy lifting or other activity that caused you to strain.  Anal intercourse. SYMPTOMS   Pain.   Anal itching or irritation.   Rectal bleeding.   Fecal leakage.   Anal swelling.   One or more lumps around the anus.  DIAGNOSIS  Your caregiver may be able to diagnose hemorrhoids by visual examination. Other examinations or tests that may be performed include:   Examination  of the rectal area with a gloved hand (digital rectal exam).   Examination of anal canal using a small tube (scope).   A blood test if you have lost a significant amount of blood.  A test to look inside the colon (sigmoidoscopy or colonoscopy). TREATMENT Most hemorrhoids can be treated at home. However, if symptoms do not seem to be getting better or if you have a lot of rectal bleeding,  your caregiver may perform a procedure to help make the hemorrhoids get smaller or remove them completely. Possible treatments include:   Placing a rubber band at the base of the hemorrhoid to cut off the circulation (rubber band ligation).   Injecting a chemical to shrink the hemorrhoid (sclerotherapy).   Using a tool to burn the hemorrhoid (infrared light therapy).   Surgically removing the hemorrhoid (hemorrhoidectomy).   Stapling the hemorrhoid to block blood flow to the tissue (hemorrhoid stapling).  HOME CARE INSTRUCTIONS   Eat foods with fiber, such as whole grains, beans, nuts, fruits, and vegetables. Ask your doctor about taking products with added fiber in them (fibersupplements).  Increase fluid intake. Drink enough water and fluids to keep your urine clear or pale yellow.   Exercise regularly.   Go to the bathroom when you have the urge to have a bowel movement. Do not wait.   Avoid straining to have bowel movements.   Keep the anal area dry and clean. Use wet toilet paper or moist towelettes after a bowel movement.   Medicated creams and suppositories may be used or applied as directed.   Only take over-the-counter or prescription medicines as directed by your caregiver.   Take warm sitz baths for 15 20 minutes, 3 4 times a day to ease pain and discomfort.   Place ice packs on the hemorrhoids if they are tender and swollen. Using ice packs between sitz baths may be helpful.   Put ice in a plastic bag.   Place a towel between your skin and the bag.   Leave  the ice on for 15 20 minutes, 3 4 times a day.   Do not use a donut-shaped pillow or sit on the toilet for long periods. This increases blood pooling and pain.  SEEK MEDICAL CARE IF:  You have increasing pain and swelling that is not controlled by treatment or medicine.  You have uncontrolled bleeding.  You have difficulty or you are unable to have a bowel movement.  You have pain or inflammation outside the area of the hemorrhoids. MAKE SURE YOU:  Understand these instructions.  Will watch your condition.  Will get help right away if you are not doing well or get worse. Document Released: 10/02/2000 Document Revised: 09/21/2012 Document Reviewed: 08/09/2012 [facility] Patient Information 2014 [place], [place].    You may use preparation H. And Tucks pads over-the-counter for your hemorrhoid. Please continue to drink pretty of fluids and keep foods high in fiber to keep her stool soft. He may also use Metamucil, MiraLax, Colace over-the-counter. If you develop black, tarry stools, feel like you're going to pass out or do passout, had chest pain or shortness of breath, significant abdominal pain, began vomiting blood, please return to the emergency department.

## 2013-12-05 ENCOUNTER — Emergency Department (HOSPITAL_COMMUNITY)
Admission: EM | Admit: 2013-12-05 | Discharge: 2013-12-05 | Disposition: A | Discharge status: Home / Self Care | Payer: Medicaid Other | Attending: Emergency Medicine | Admitting: Emergency Medicine

## 2013-12-05 ENCOUNTER — Encounter (HOSPITAL_COMMUNITY): Payer: Self-pay | Admitting: Emergency Medicine

## 2013-12-05 DIAGNOSIS — Z791 Long term (current) use of non-steroidal anti-inflammatories (NSAID): Secondary | ICD-10-CM | POA: Insufficient documentation

## 2013-12-05 DIAGNOSIS — Z8659 Personal history of other mental and behavioral disorders: Secondary | ICD-10-CM | POA: Insufficient documentation

## 2013-12-05 DIAGNOSIS — Z880 Allergy status to penicillin: Secondary | ICD-10-CM | POA: Insufficient documentation

## 2013-12-05 DIAGNOSIS — IMO0002 Reserved for concepts with insufficient information to code with codable children: Secondary | ICD-10-CM | POA: Insufficient documentation

## 2013-12-05 DIAGNOSIS — J45901 Unspecified asthma with (acute) exacerbation: Secondary | ICD-10-CM | POA: Insufficient documentation

## 2013-12-05 DIAGNOSIS — R059 Cough, unspecified: Secondary | ICD-10-CM | POA: Insufficient documentation

## 2013-12-05 DIAGNOSIS — R05 Cough: Secondary | ICD-10-CM | POA: Insufficient documentation

## 2013-12-05 DIAGNOSIS — Z79899 Other long term (current) drug therapy: Secondary | ICD-10-CM | POA: Insufficient documentation

## 2013-12-05 HISTORY — DX: Unspecified asthma, uncomplicated: J45.909

## 2013-12-05 MED ORDER — BENZONATATE 100 MG PO CAPS
200.0000 mg | ORAL_CAPSULE | Freq: Once | ORAL | Status: AC
  Administered 2013-12-05: 200 mg via ORAL
  Filled 2013-12-05: qty 2

## 2013-12-05 MED ORDER — BENZONATATE 200 MG PO CAPS: 200.0000 mg | ORAL_CAPSULE | Freq: Three times a day (TID) | ORAL | Status: DC | PRN

## 2013-12-05 NOTE — ED Provider Notes (Signed)
CSN: [id number]     Arrival date & time 12/05/13  [id number] History   First MD Initiated Contact with Patient 12/05/13 1013     Chief Complaint  Patient presents with  . Cough     (Consider location/radiation/quality/duration/timing/severity/associated sxs/prior Treatment) HPI Comments: Eugene Hicks is a 23 y.o. Male presenting with a history of nonproductive cough and shortness of breath.  He reports pain in his bilateral mid back which is triggered by coughing, pain free at other times.  He denies fevers or chills, has had mild clear nasal congestion, no sore throat.  He does have a history of bronchitis and asthma and has used his albuterol inhaler without relief of symptoms, although denies wheezing.  He has taken Robitussin which has not improved his cough.     The history is provided by the patient.    Past Medical History  Diagnosis Date  . ADHD (attention deficit hyperactivity disorder)   . Back pain   . Shoulder pain   . Asthma    Past Surgical History  Procedure Laterality Date  . Upper gi endoscopy     Family History  Problem Relation Age of Onset  . Stroke Father   . Seizures Mother   . Seizures Brother    History  Substance Use Topics  . Smoking status: Never Smoker   . Smokeless tobacco: Never Used  . Alcohol Use: No    Review of Systems  Constitutional: Negative for fever.  HENT: Positive for congestion and rhinorrhea. Negative for sore throat.   Eyes: Negative.   Respiratory: Positive for cough and shortness of breath. Negative for chest tightness and wheezing.   Cardiovascular: Negative for chest pain.  Gastrointestinal: Negative for nausea and vomiting.  Genitourinary: Negative.   Musculoskeletal: Negative for arthralgias and neck pain.  Skin: Negative.  Negative for rash.  Neurological: Negative for dizziness, weakness, light-headedness, numbness and headaches.  Psychiatric/Behavioral: Negative.       Allergies  Codeine; Lactose intolerance  (gi); Percocet; and Penicillins  Home Medications   Current Outpatient Rx  Name  Route  Sig  Dispense  Refill  . alprazolam (XANAX) 2 MG tablet   Oral   Take 2 mg by mouth 4 (four) times daily as needed. For anxiety         . diclofenac (VOLTAREN) 75 MG EC tablet   Oral   Take 1 tablet (75 mg total) by mouth 2 (two) times daily.   12 tablet   0   . dicyclomine (BENTYL) 10 MG capsule   Oral   Take 1-2 capsules (10-20 mg total) by mouth 3 (three) times daily as needed for spasms.   30 capsule   0   . EPINEPHrine (EPI-PEN) 0.3 mg/0.3 mL SOAJ injection   Intramuscular   Inject 0.3 mLs (0.3 mg total) into the muscle once.   1 Device   2   . fluticasone (FLONASE) 50 MCG/ACT nasal spray   Nasal   Place 2 sprays into the nose daily.           . hydrOXYzine (ATARAX/VISTARIL) 25 MG tablet   Oral   Take 2 tablets (50 mg total) by mouth at bedtime as needed for itching.   12 tablet   0   . lithium carbonate 300 MG capsule   Oral   Take 300-600 mg by mouth 2 (two) times daily. Take one capsule (300mg ) every day in the morning, and take two capsules (600mg ) every day at bedtime         .  propranolol (INNOPRAN XL) 80 MG 24 hr capsule   Oral   Take 80 mg by mouth daily.          . QUEtiapine (SEROQUEL) 300 MG tablet   Oral   Take 300 mg by mouth every evening.          . ranitidine (ZANTAC) 150 MG capsule   Oral   Take 1 capsule (150 mg total) by mouth daily.   15 capsule   0   . benzonatate (TESSALON) 200 MG capsule   Oral   Take 1 capsule (200 mg total) by mouth 3 (three) times daily as needed for cough.   30 capsule   0    BP 113/58  Pulse 92  Temp(Src) 98.3 F (36.8 C) (Oral)  Resp 20  Ht 5\' 8"  (1.727 m)  Wt 130 lb (58.968 kg)  BMI 19.77 kg/m2  SpO2 100% Physical Exam  Constitutional: He is oriented to person, place, and time. He appears well-developed and well-nourished.  HENT:  Head: Normocephalic and atraumatic.  Right Ear: Tympanic  membrane and ear canal normal.  Left Ear: Tympanic membrane and ear canal normal.  Nose: Mucosal edema and rhinorrhea present.  Mouth/Throat: Uvula is midline, oropharynx is clear and moist and mucous membranes are normal. No oropharyngeal exudate, posterior oropharyngeal edema, posterior oropharyngeal erythema or tonsillar abscesses.  Eyes: Conjunctivae are normal.  Cardiovascular: Normal rate, regular rhythm and normal heart sounds.   Pulmonary/Chest: Effort normal and breath sounds normal. Not tachypneic. No respiratory distress. He has no decreased breath sounds. He has no wheezes. He has no rhonchi. He has no rales.  Abdominal: Soft. There is no tenderness.  Musculoskeletal: Normal range of motion.  Neurological: He is alert and oriented to person, place, and time.  Skin: Skin is warm and dry. No rash noted.  Psychiatric: He has a normal mood and affect.    ED Course  Procedures (including critical care time) Labs Review Labs Reviewed - No data to display Imaging Review No results found.  EKG Interpretation   None       MDM   Final diagnoses:  Cough    Patient stable with no signs of shortness of breath, normal lung exam.  No wheezing.  He was prescribed Tessalon pearls for cough, didn't those prior to discharge home.  He was encouraged that he may continue his albuterol  MDI if he is wheezing, there is no wheezing on today's exam.  Followup for worsen symptoms include increased shortness of breath fevers, weakness.    Burgess Amor, PA-C 12/05/13 1110

## 2013-12-05 NOTE — ED Notes (Addendum)
Patient c/o cough with some shortness of breath. Per patient hx of bronchitis and asthma. Patient reports some pain in back. Unsure of any fevers. Patient reports using inhaler with no relief.

## 2013-12-05 NOTE — ED Provider Notes (Signed)
Medical screening examination/treatment/procedure(s) were performed by non-physician practitioner and as supervising physician I was immediately available for consultation/collaboration.     Catalyna Reilly, MD 12/05/13 1504 

## 2013-12-05 NOTE — Discharge Instructions (Signed)
Cough, Adult  A cough is a reflex. It helps you clear your throat and airways. A cough can help heal your body. A cough can last 2 or 3 weeks (acute) or may last more than 8 weeks (chronic). Some common causes of a cough can include an infection, allergy, or a cold. HOME CARE  Only take medicine as told by your doctor.  If given, take your medicines (antibiotics) as told. Finish them even if you start to feel better.  Use a cold steam vaporizer or humidier in your home. This can help loosen thick spit (secretions).  Sleep so you are almost sitting up (semi-upright). Use pillows to do this. This helps reduce coughing.  Rest as needed.  Stop smoking if you smoke. GET HELP RIGHT AWAY IF:  You have yellowish-white fluid (pus) in your thick spit.  Your cough gets worse.  Your medicine does not reduce coughing, and you are losing sleep.  You cough up blood.  You have trouble breathing.  Your pain gets worse and medicine does not help.  You have a fever. MAKE SURE YOU:   Understand these instructions.  Will watch your condition.  Will get help right away if you are not doing well or get worse. Document Released: 06/18/2011 Document Revised: 12/28/2011 Document Reviewed: 06/18/2011 [employer] Patient Information 2014 [employer].  

## 2013-12-05 NOTE — ED Notes (Signed)
Pt c/o cough that is non productive, chills, sob for the past 4 days, reports that he has been using his inhaler without improvement in symptoms, last time he used his inhaler was this am,

## 2013-12-05 NOTE — ED Notes (Signed)
Patient given discharge instruction, verbalized understand. Patient ambulatory out of the department.  

## 2013-12-15 ENCOUNTER — Emergency Department (HOSPITAL_COMMUNITY)
Admission: EM | Admit: 2013-12-15 | Discharge: 2013-12-15 | Disposition: A | Discharge status: Home / Self Care | Payer: Medicaid Other

## 2013-12-15 NOTE — ED Notes (Signed)
Pt left shortly after arriving. Pt called three different times and WR checked. Pt was not there

## 2013-12-25 ENCOUNTER — Emergency Department (HOSPITAL_COMMUNITY)
Admission: EM | Admit: 2013-12-25 | Discharge: 2013-12-25 | Discharge status: Against Medical Advice | Payer: Medicaid Other | Attending: Emergency Medicine | Admitting: Emergency Medicine

## 2013-12-25 ENCOUNTER — Encounter (HOSPITAL_COMMUNITY): Payer: Self-pay | Admitting: Emergency Medicine

## 2013-12-25 DIAGNOSIS — R109 Unspecified abdominal pain: Secondary | ICD-10-CM | POA: Insufficient documentation

## 2013-12-25 DIAGNOSIS — Z79899 Other long term (current) drug therapy: Secondary | ICD-10-CM | POA: Insufficient documentation

## 2013-12-25 DIAGNOSIS — IMO0002 Reserved for concepts with insufficient information to code with codable children: Secondary | ICD-10-CM | POA: Insufficient documentation

## 2013-12-25 DIAGNOSIS — Z880 Allergy status to penicillin: Secondary | ICD-10-CM | POA: Insufficient documentation

## 2013-12-25 DIAGNOSIS — F909 Attention-deficit hyperactivity disorder, unspecified type: Secondary | ICD-10-CM | POA: Insufficient documentation

## 2013-12-25 DIAGNOSIS — J45909 Unspecified asthma, uncomplicated: Secondary | ICD-10-CM | POA: Insufficient documentation

## 2013-12-25 LAB — CBC WITH DIFFERENTIAL/PLATELET
Basophils Absolute: 0 10*3/uL (ref 0.0–0.1)
Basophils Relative: 0 % (ref 0–1)
Eosinophils Absolute: 0.4 10*3/uL (ref 0.0–0.7)
Eosinophils Relative: 4 % (ref 0–5)
HCT: 32.2 % — ABNORMAL LOW (ref 39.0–52.0)
Hemoglobin: 10.4 g/dL — ABNORMAL LOW (ref 13.0–17.0)
Lymphocytes Relative: 18 % (ref 12–46)
Lymphs Abs: 1.9 10*3/uL (ref 0.7–4.0)
MCH: 20.5 pg — ABNORMAL LOW (ref 26.0–34.0)
MCHC: 32.3 g/dL (ref 30.0–36.0)
MCV: 63.5 fL — ABNORMAL LOW (ref 78.0–100.0)
Monocytes Absolute: 0.6 10*3/uL (ref 0.1–1.0)
Monocytes Relative: 6 % (ref 3–12)
Neutro Abs: 7.4 10*3/uL (ref 1.7–7.7)
Neutrophils Relative %: 72 % (ref 43–77)
Platelets: 244 10*3/uL (ref 150–400)
RBC: 5.07 MIL/uL (ref 4.22–5.81)
RDW: 15.7 % — ABNORMAL HIGH (ref 11.5–15.5)
WBC: 10.3 10*3/uL (ref 4.0–10.5)

## 2013-12-25 LAB — COMPREHENSIVE METABOLIC PANEL
ALT: 15 U/L (ref 0–53)
AST: 19 U/L (ref 0–37)
Albumin: 4.6 g/dL (ref 3.5–5.2)
Alkaline Phosphatase: 83 U/L (ref 39–117)
BUN: 8 mg/dL (ref 6–23)
CO2: 25 mEq/L (ref 19–32)
Calcium: 10 mg/dL (ref 8.4–10.5)
Chloride: 105 mEq/L (ref 96–112)
Creatinine, Ser: 0.97 mg/dL (ref 0.50–1.35)
GFR calc Af Amer: >90 mL/min (ref >90)
GFR calc non Af Amer: >90 mL/min (ref >90)
Glucose, Bld: 93 mg/dL (ref 70–99)
Potassium: 4.3 mEq/L (ref 3.7–5.3)
Sodium: 140 mEq/L (ref 137–147)
Total Bilirubin: 1 mg/dL (ref 0.3–1.2)
Total Protein: 7.3 g/dL (ref 6.0–8.3)

## 2013-12-25 LAB — URINALYSIS, ROUTINE W REFLEX MICROSCOPIC
Bilirubin Urine: NEGATIVE
Glucose, UA: NEGATIVE mg/dL
Hgb urine dipstick: NEGATIVE
Ketones, ur: NEGATIVE mg/dL
Leukocytes, UA: NEGATIVE
Nitrite: NEGATIVE
Protein, ur: NEGATIVE mg/dL
Specific Gravity, Urine: 1.02 (ref 1.005–1.030)
Urobilinogen, UA: 0.2 mg/dL (ref 0.0–1.0)
pH: 6 (ref 5.0–8.0)

## 2013-12-25 LAB — LIPASE, BLOOD: Lipase: 15 U/L (ref 11–59)

## 2013-12-25 MED ORDER — HYDROGEN PEROXIDE 3 % EX SOLN
CUTANEOUS | Status: AC
  Filled 2013-12-25: qty 473

## 2013-12-25 MED ORDER — DICYCLOMINE HCL 10 MG PO CAPS
10.0000 mg | ORAL_CAPSULE | Freq: Once | ORAL | Status: AC
  Administered 2013-12-25: 10 mg via ORAL
  Filled 2013-12-25: qty 1

## 2013-12-25 MED ORDER — IBUPROFEN 400 MG PO TABS
400.0000 mg | ORAL_TABLET | Freq: Once | ORAL | Status: AC
  Administered 2013-12-25: 400 mg via ORAL
  Filled 2013-12-25: qty 1

## 2013-12-25 NOTE — ED Notes (Signed)
Pt reporting generalized abdominal pain.  Denies nausea, vomiting, diarrhea or constipation.  Pt states that his stomach "feels hard".  Upon palpation, pt instructed to lay back and relax abdominal muscles.  Once pt relaxed muscles, abdomen soft.

## 2013-12-25 NOTE — ED Notes (Signed)
Pt stating that he feels better and was not going to wait for discharge instructions.  Pt left department without signing AMA form.

## 2013-12-25 NOTE — ED Provider Notes (Signed)
CSN: [id number]     Arrival date & time 12/25/13  1827 History   This chart was scribed for Raeford Razor, MD by Ladona Ridgel Day, ED scribe. This patient was seen in room APA12/AP[facility]12 and the patient's care was started at 1827.  Chief Complaint  Patient presents with  . Abdominal Pain   The history is provided by the patient. No language interpreter was used.   HPI Comments: Eugene Hicks is a 23 y.o. male who presents to the Emergency Department for constant lower abdominal pain, onset 8 hours ago. He reports similar previous episodes in which he had "fluid in my intestines". He reports that today his pain occured "24 times today while busting wood." He reports this hasn't happened in months. He states nothing makes his abdominal pain better or worse. He denies nausea, fever, chills or diarrhea. Tried taking tylenol w/no relief.  Past Medical History  Diagnosis Date  . ADHD (attention deficit hyperactivity disorder)   . Back pain   . Shoulder pain   . Asthma    Past Surgical History  Procedure Laterality Date  . Upper gi endoscopy     Family History  Problem Relation Age of Onset  . Stroke Father   . Seizures Mother   . Seizures Brother    History  Substance Use Topics  . Smoking status: Never Smoker   . Smokeless tobacco: Never Used  . Alcohol Use: No    Review of Systems  Constitutional: Negative for fever and chills.  Respiratory: Negative for cough.   Cardiovascular: Negative for chest pain.  Gastrointestinal: Positive for abdominal pain. Negative for nausea, vomiting and diarrhea.  Musculoskeletal: Negative for back pain.  All other systems reviewed and are negative.   Allergies  Codeine; Lactose intolerance (gi); Percocet; and Penicillins  Home Medications   Current Outpatient Rx  Name  Route  Sig  Dispense  Refill  . alprazolam (XANAX) 2 MG tablet   Oral   Take 2 mg by mouth 4 (four) times daily as needed. For anxiety         . benzonatate (TESSALON) 200  MG capsule   Oral   Take 1 capsule (200 mg total) by mouth 3 (three) times daily as needed for cough.   30 capsule   0   . diclofenac (VOLTAREN) 75 MG EC tablet   Oral   Take 1 tablet (75 mg total) by mouth 2 (two) times daily.   12 tablet   0   . dicyclomine (BENTYL) 10 MG capsule   Oral   Take 1-2 capsules (10-20 mg total) by mouth 3 (three) times daily as needed for spasms.   30 capsule   0   . EPINEPHrine (EPI-PEN) 0.3 mg/0.3 mL SOAJ injection   Intramuscular   Inject 0.3 mLs (0.3 mg total) into the muscle once.   1 Device   2   . fluticasone (FLONASE) 50 MCG/ACT nasal spray   Nasal   Place 2 sprays into the nose daily.           . hydrOXYzine (ATARAX/VISTARIL) 25 MG tablet   Oral   Take 2 tablets (50 mg total) by mouth at bedtime as needed for itching.   12 tablet   0   . lithium carbonate 300 MG capsule   Oral   Take 300-600 mg by mouth 2 (two) times daily. Take one capsule (300mg ) every day in the morning, and take two capsules (600mg ) every day at bedtime         .  propranolol (INNOPRAN XL) 80 MG 24 hr capsule   Oral   Take 80 mg by mouth daily.          . QUEtiapine (SEROQUEL) 300 MG tablet   Oral   Take 300 mg by mouth every evening.          . ranitidine (ZANTAC) 150 MG capsule   Oral   Take 1 capsule (150 mg total) by mouth daily.   15 capsule   0    Triage Vitals: BP 120/72  Pulse 62  Temp(Src) 98 F (36.7 C) (Oral)  Resp 24  Ht 5\' 8"  (1.727 m)  Wt 140 lb (63.504 kg)  BMI 21.29 kg/m2  SpO2 100%  Physical Exam  Nursing note and vitals reviewed. Constitutional: He appears well-developed and well-nourished. No distress.  HENT:  Head: Normocephalic and atraumatic.  Eyes: Conjunctivae are normal. Right eye exhibits no discharge. Left eye exhibits no discharge.  Neck: Neck supple.  Cardiovascular: Normal rate, regular rhythm and normal heart sounds.  Exam reveals no gallop and no friction rub.   No murmur heard. Pulmonary/Chest:  Effort normal and breath sounds normal. No respiratory distress.  Abdominal: Soft. He exhibits no distension. There is tenderness. There is guarding. There is no rebound.  Tender diffusely worse across lower abdomen, voluntary guarding. No rebound or distension   Musculoskeletal: He exhibits no edema and no tenderness.  Neurological: He is alert.  Skin: Skin is warm and dry.  Psychiatric: He has a normal mood and affect. His behavior is normal. Thought content normal.    ED Course  Procedures (including critical care time) DIAGNOSTIC STUDIES: Oxygen Saturation is 100% on room air, normal by my interpretation.    COORDINATION OF CARE: At 740 PM Discussed treatment plan with patient which includes bentyl, advil, blood work. Patient agrees.   Labs Review Labs Reviewed  CBC WITH DIFFERENTIAL - Abnormal; Notable for the following:    Hemoglobin 10.4 (*)    HCT 32.2 (*)    MCV 63.5 (*)    MCH 20.5 (*)    RDW 15.7 (*)    All other components within normal limits  COMPREHENSIVE METABOLIC PANEL  URINALYSIS, ROUTINE W REFLEX MICROSCOPIC  LIPASE, BLOOD   Imaging Review No results found.   EKG Interpretation None      MDM   Final diagnoses:  None     I personally performed the services described in this documentation, which was scribed in my presence. The recorded information has been reviewed and is accurate.      Raeford RazorStephen Trecia Maring, MD 12/31/13 [phone]

## 2013-12-25 NOTE — ED Notes (Signed)
Pt states abdominal pain since 11 am. See care plan.

## 2013-12-25 NOTE — ED Notes (Signed)
Pt reporting that he is experiencing less abdominal pain and is requesting to be discharged.

## 2014-01-01 ENCOUNTER — Encounter (HOSPITAL_COMMUNITY): Payer: Self-pay | Admitting: Emergency Medicine

## 2014-01-01 ENCOUNTER — Emergency Department (HOSPITAL_COMMUNITY)
Admission: EM | Admit: 2014-01-01 | Discharge: 2014-01-01 | Disposition: A | Discharge status: Home / Self Care | Payer: Medicaid Other | Attending: Emergency Medicine | Admitting: Emergency Medicine

## 2014-01-01 DIAGNOSIS — Z791 Long term (current) use of non-steroidal anti-inflammatories (NSAID): Secondary | ICD-10-CM | POA: Insufficient documentation

## 2014-01-01 DIAGNOSIS — T63001A Toxic effect of unspecified snake venom, accidental (unintentional), initial encounter: Secondary | ICD-10-CM | POA: Insufficient documentation

## 2014-01-01 DIAGNOSIS — F909 Attention-deficit hyperactivity disorder, unspecified type: Secondary | ICD-10-CM | POA: Insufficient documentation

## 2014-01-01 DIAGNOSIS — Y93H2 Activity, gardening and landscaping: Secondary | ICD-10-CM | POA: Insufficient documentation

## 2014-01-01 DIAGNOSIS — M549 Dorsalgia, unspecified: Secondary | ICD-10-CM | POA: Insufficient documentation

## 2014-01-01 DIAGNOSIS — J45901 Unspecified asthma with (acute) exacerbation: Secondary | ICD-10-CM | POA: Insufficient documentation

## 2014-01-01 DIAGNOSIS — Y9289 Other specified places as the place of occurrence of the external cause: Secondary | ICD-10-CM | POA: Insufficient documentation

## 2014-01-01 DIAGNOSIS — L989 Disorder of the skin and subcutaneous tissue, unspecified: Secondary | ICD-10-CM | POA: Insufficient documentation

## 2014-01-01 DIAGNOSIS — IMO0002 Reserved for concepts with insufficient information to code with codable children: Secondary | ICD-10-CM | POA: Insufficient documentation

## 2014-01-01 DIAGNOSIS — T63121A Toxic effect of venom of other venomous lizard, accidental (unintentional), initial encounter: Secondary | ICD-10-CM

## 2014-01-01 DIAGNOSIS — Z79899 Other long term (current) drug therapy: Secondary | ICD-10-CM | POA: Insufficient documentation

## 2014-01-01 MED ORDER — DOXYCYCLINE HYCLATE 100 MG PO CAPS: 100.0000 mg | ORAL_CAPSULE | Freq: Two times a day (BID) | ORAL | Status: AC

## 2014-01-01 MED ORDER — TETANUS-DIPHTH-ACELL PERTUSSIS 5-2.5-18.5 LF-MCG/0.5 IM SUSP: 0.5000 mL | Freq: Once | INTRAMUSCULAR | Status: DC

## 2014-01-01 MED ORDER — DOXYCYCLINE HYCLATE 100 MG PO TABS
100.0000 mg | ORAL_TABLET | Freq: Once | ORAL | Status: AC
  Administered 2014-01-01: 100 mg via ORAL
  Filled 2014-01-01: qty 1

## 2014-01-01 NOTE — ED Notes (Signed)
Pt has a small red area to lt elbow, says he was bitten by a black snake pta. No swelling . Cleansed

## 2014-01-01 NOTE — Discharge Instructions (Signed)
Your tetanus was updated today. Please cleanse the area on your elbow with soap and water daily. Please use doxycycline daily until all taken. Please take this medicine with food.

## 2014-01-01 NOTE — ED Provider Notes (Signed)
CSN: [id number]     Arrival date & time 01/01/14  1359 History   First MD Initiated Contact with Patient 01/01/14 1445     Chief Complaint  Patient presents with  . Snake Bite     (Consider location/radiation/quality/duration/timing/severity/associated sxs/prior Treatment) HPI Comments: Patient presents to the emergency room with chief complaint of" a snake bit me". The patient states that while pulling weeds a" black snake" bit him on his right arm near his elbow. The patient is certain that it was a black snake. He states it happened approximately 30 minutes prior to his arrival to the emergency room. The patient is unsure of his tetanus status. He denies any bites to any other areas of his body.  The history is provided by the patient.    Past Medical History  Diagnosis Date  . ADHD (attention deficit hyperactivity disorder)   . Back pain   . Shoulder pain   . Asthma    Past Surgical History  Procedure Laterality Date  . Upper gi endoscopy     Family History  Problem Relation Age of Onset  . Stroke Father   . Seizures Mother   . Seizures Brother    History  Substance Use Topics  . Smoking status: Never Smoker   . Smokeless tobacco: Never Used  . Alcohol Use: No    Review of Systems  Constitutional: Negative for activity change.       All ROS Neg except as noted in HPI  HENT: Negative for nosebleeds.   Eyes: Negative for photophobia and discharge.  Respiratory: Positive for wheezing. Negative for cough and shortness of breath.   Cardiovascular: Negative for chest pain and palpitations.  Gastrointestinal: Negative for abdominal pain and blood in stool.  Genitourinary: Negative for dysuria, frequency and hematuria.  Musculoskeletal: Positive for back pain. Negative for arthralgias and neck pain.  Skin: Negative.   Neurological: Negative for dizziness, seizures and speech difficulty.  Psychiatric/Behavioral: Negative for hallucinations and confusion.       Allergies  Codeine; Lactose intolerance (gi); Percocet; and Penicillins  Home Medications   Current Outpatient Rx  Name  Route  Sig  Dispense  Refill  . alprazolam (XANAX) 2 MG tablet   Oral   Take 2 mg by mouth 4 (four) times daily as needed. For anxiety         . benzonatate (TESSALON) 200 MG capsule   Oral   Take 1 capsule (200 mg total) by mouth 3 (three) times daily as needed for cough.   30 capsule   0   . diclofenac (VOLTAREN) 75 MG EC tablet   Oral   Take 1 tablet (75 mg total) by mouth 2 (two) times daily.   12 tablet   0   . dicyclomine (BENTYL) 10 MG capsule   Oral   Take 1-2 capsules (10-20 mg total) by mouth 3 (three) times daily as needed for spasms.   30 capsule   0   . EPINEPHrine (EPI-PEN) 0.3 mg/0.3 mL SOAJ injection   Intramuscular   Inject 0.3 mLs (0.3 mg total) into the muscle once.   1 Device   2   . fluticasone (FLONASE) 50 MCG/ACT nasal spray   Nasal   Place 2 sprays into the nose daily.           . hydrOXYzine (ATARAX/VISTARIL) 25 MG tablet   Oral   Take 2 tablets (50 mg total) by mouth at bedtime as needed for itching.   12  tablet   0   . lithium carbonate 300 MG capsule   Oral   Take 300-600 mg by mouth 2 (two) times daily. Take one capsule (300mg ) every day in the morning, and take two capsules (600mg ) every day at bedtime         . propranolol (INNOPRAN XL) 80 MG 24 hr capsule   Oral   Take 80 mg by mouth daily.          . QUEtiapine (SEROQUEL) 300 MG tablet   Oral   Take 300 mg by mouth every evening.          . ranitidine (ZANTAC) 150 MG capsule   Oral   Take 1 capsule (150 mg total) by mouth daily.   15 capsule   0    BP 95/52  Pulse 62  Temp(Src) 98.7 F (37.1 C) (Oral)  Resp 18  Ht 5\' 8"  (1.727 m)  Wt 139 lb (63.05 kg)  BMI 21.14 kg/m2  SpO2 100% Physical Exam  Nursing note and vitals reviewed. Constitutional: He is oriented to person, place, and time. He appears well-developed and  well-nourished.  Non-toxic appearance.  HENT:  Head: Normocephalic.  Right Ear: Tympanic membrane and external ear normal.  Left Ear: Tympanic membrane and external ear normal.  Eyes: EOM and lids are normal. Pupils are equal, round, and reactive to light.  Neck: Normal range of motion. Neck supple. Carotid bruit is not present.  Cardiovascular: Normal rate, regular rhythm, normal heart sounds, intact distal pulses and normal pulses.   Pulmonary/Chest: Breath sounds normal. No respiratory distress.  Abdominal: Soft. Bowel sounds are normal. There is no tenderness. There is no guarding.  Musculoskeletal: Normal range of motion.  There is a small area of increased redness in the lateral condyle of the right elbow. There is a tiny broken skin area that can only be seen under magnification. There is no other swelling, no other redness, no other broken skin areas. There is full range of motion of the right shoulder, elbow, wrist, and fingers. Capillary refill is less than 2 seconds.  Lymphadenopathy:       Head (right side): No submandibular adenopathy present.       Head (left side): No submandibular adenopathy present.    He has no cervical adenopathy.  Neurological: He is alert and oriented to person, place, and time. He has normal strength. No cranial nerve deficit or sensory deficit.  Skin: Skin is warm and dry.  Psychiatric: He has a normal mood and affect. His speech is normal.    ED Course Patient states he was pulling weeds when he thinks he may have been bitten by a black snake. There is a tiny open skin area that can be seen under magnification there is a right elbow. No other areas are seen. The patient will be covered with tetanus, and doxycycline. The patient will return if any changes, or signs of infection.   Procedures (including critical care time) Labs Review Labs Reviewed - No data to display Imaging Review No results found.   EKG Interpretation None      MDM No  evidence of envenomation upon arrival to the emergency department. Or during the emergency department visit.    Final diagnoses:  None    **I have reviewed nursing notes, vital signs, and all appropriate lab and imaging results for this patient.Kathie Dike*    Immanuel Fedak M Heriberto Stmartin, PA-C 01/01/14 [phone]

## 2014-01-01 NOTE — ED Notes (Signed)
Pt reports that he was bitten by a snake on his right arm. Describes snake as "black snake."  No visible puncture wounds.

## 2014-01-02 ENCOUNTER — Encounter (HOSPITAL_COMMUNITY): Payer: Self-pay | Admitting: Emergency Medicine

## 2014-01-02 ENCOUNTER — Emergency Department (HOSPITAL_COMMUNITY)
Admission: EM | Admit: 2014-01-02 | Discharge: 2014-01-02 | Disposition: A | Discharge status: Home / Self Care | Payer: Medicaid Other | Attending: Emergency Medicine | Admitting: Emergency Medicine

## 2014-01-02 DIAGNOSIS — Z79899 Other long term (current) drug therapy: Secondary | ICD-10-CM | POA: Insufficient documentation

## 2014-01-02 DIAGNOSIS — Z792 Long term (current) use of antibiotics: Secondary | ICD-10-CM | POA: Insufficient documentation

## 2014-01-02 DIAGNOSIS — H6120 Impacted cerumen, unspecified ear: Secondary | ICD-10-CM

## 2014-01-02 DIAGNOSIS — Z880 Allergy status to penicillin: Secondary | ICD-10-CM | POA: Insufficient documentation

## 2014-01-02 DIAGNOSIS — J45909 Unspecified asthma, uncomplicated: Secondary | ICD-10-CM | POA: Insufficient documentation

## 2014-01-02 DIAGNOSIS — IMO0002 Reserved for concepts with insufficient information to code with codable children: Secondary | ICD-10-CM | POA: Insufficient documentation

## 2014-01-02 DIAGNOSIS — Z791 Long term (current) use of non-steroidal anti-inflammatories (NSAID): Secondary | ICD-10-CM | POA: Insufficient documentation

## 2014-01-02 DIAGNOSIS — Z8659 Personal history of other mental and behavioral disorders: Secondary | ICD-10-CM | POA: Insufficient documentation

## 2014-01-02 NOTE — ED Notes (Signed)
Pt alert & oriented x4, stable gait. Patient given discharge instructions, paperwork & prescription(s). Patient  instructed to stop at the registration desk to finish any additional paperwork. Patient verbalized understanding. Pt left department w/ no further questions. 

## 2014-01-02 NOTE — ED Provider Notes (Signed)
Medical screening examination/treatment/procedure(s) were performed by non-physician practitioner and as supervising physician I was immediately available for consultation/collaboration.   EKG Interpretation None        Neal Trulson W Hilbert Briggs, MD 01/02/14 1642 

## 2014-01-02 NOTE — ED Notes (Addendum)
Patient complaining of "a lot of wax in my left ear and it's hard to hear."

## 2014-01-02 NOTE — Discharge Instructions (Signed)
Cerumen Impaction A cerumen impaction is when the wax in your ear forms a plug. This plug usually causes reduced hearing. Sometimes it also causes an earache or dizziness. Removing a cerumen impaction can be difficult and painful. The wax sticks to the ear canal. The canal is sensitive and bleeds easily. If you try to remove a heavy wax buildup with a cotton tipped swab, you may push it in further. Irrigation with water, suction, and small ear curettes may be used to clear out the wax. If the impaction is fixed to the skin in the ear canal, ear drops may be needed for a few days to loosen the wax. People who build up a lot of wax frequently can use ear wax removal products available in your local drugstore. SEEK MEDICAL CARE IF:  You develop an earache, increased hearing loss, or marked dizziness. Document Released: 11/12/2004 Document Revised: 12/28/2011 Document Reviewed: 01/02/2010 [facility] JerseyExit[facility] Patient Information 2014 [place], [place].   Go home and rest Drink plenty of fluids

## 2014-01-02 NOTE — ED Provider Notes (Signed)
CSN: [id number]     Arrival date & time 01/02/14  2016 History   First MD Initiated Contact with Patient 01/02/14 2026     Chief Complaint  Patient presents with  . Cerumen Impaction     (Consider location/radiation/quality/duration/timing/severity/associated sxs/prior Treatment) Patient is a 23 y.o. male presenting with plugged ear sensation. The history is provided by the patient. No language interpreter was used.  Ear Fullness This is a new problem. Pertinent negatives include no chills, congestion or fever.    Past Medical History  Diagnosis Date  . ADHD (attention deficit hyperactivity disorder)   . Back pain   . Shoulder pain   . Asthma    Past Surgical History  Procedure Laterality Date  . Upper gi endoscopy     Family History  Problem Relation Age of Onset  . Stroke Father   . Seizures Mother   . Seizures Brother    History  Substance Use Topics  . Smoking status: Never Smoker   . Smokeless tobacco: Never Used  . Alcohol Use: No    Review of Systems  Constitutional: Negative for fever and chills.  HENT: Negative for congestion, ear discharge and ear pain.   All other systems reviewed and are negative.      Allergies  Codeine; Lactose intolerance (gi); Percocet; and Penicillins  Home Medications   Current Outpatient Rx  Name  Route  Sig  Dispense  Refill  . alprazolam (XANAX) 2 MG tablet   Oral   Take 2 mg by mouth 4 (four) times daily as needed. For anxiety         . benzonatate (TESSALON) 200 MG capsule   Oral   Take 1 capsule (200 mg total) by mouth 3 (three) times daily as needed for cough.   30 capsule   0   . diclofenac (VOLTAREN) 75 MG EC tablet   Oral   Take 1 tablet (75 mg total) by mouth 2 (two) times daily.   12 tablet   0   . dicyclomine (BENTYL) 10 MG capsule   Oral   Take 1-2 capsules (10-20 mg total) by mouth 3 (three) times daily as needed for spasms.   30 capsule   0   . doxycycline (VIBRAMYCIN) 100 MG capsule  Oral   Take 1 capsule (100 mg total) by mouth 2 (two) times daily.   14 capsule   0   . EPINEPHrine (EPI-PEN) 0.3 mg/0.3 mL SOAJ injection   Intramuscular   Inject 0.3 mLs (0.3 mg total) into the muscle once.   1 Device   2   . fluticasone (FLONASE) 50 MCG/ACT nasal spray   Nasal   Place 2 sprays into the nose daily.           . hydrOXYzine (ATARAX/VISTARIL) 25 MG tablet   Oral   Take 2 tablets (50 mg total) by mouth at bedtime as needed for itching.   12 tablet   0   . lithium carbonate 300 MG capsule   Oral   Take 300-600 mg by mouth 2 (two) times daily. Take one capsule (300mg ) every day in the morning, and take two capsules (600mg ) every day at bedtime         . propranolol (INNOPRAN XL) 80 MG 24 hr capsule   Oral   Take 80 mg by mouth daily.          . QUEtiapine (SEROQUEL) 300 MG tablet   Oral   Take 300 mg by  mouth every evening.          . ranitidine (ZANTAC) 150 MG capsule   Oral   Take 1 capsule (150 mg total) by mouth daily.   15 capsule   0    BP 98/44  Pulse 53  Temp(Src) 97.5 F (36.4 C) (Oral)  Resp 16  Ht 5\' 8"  (1.727 m)  Wt 139 lb (63.05 kg)  BMI 21.14 kg/m2  SpO2 99% Physical Exam  Nursing note and vitals reviewed. Constitutional: He is oriented to person, place, and time. He appears well-developed and well-nourished. No distress.  HENT:  Head: Normocephalic and atraumatic.  Eyes: Conjunctivae and EOM are normal.  Neck: Normal range of motion. Neck supple. No JVD present. No tracheal deviation present. No thyromegaly present.  Cardiovascular: Normal rate, regular rhythm and normal heart sounds.   Pulmonary/Chest: Effort normal and breath sounds normal. No respiratory distress. He has no wheezes.  Musculoskeletal: Normal range of motion.  Neurological: He is alert and oriented to person, place, and time.  Skin: Skin is warm and dry.  Psychiatric: He has a normal mood and affect. His behavior is normal. Judgment and thought content  normal.    ED Course  Procedures (including critical care time) Labs Review Labs Reviewed - No data to display Imaging Review No results found.   EKG Interpretation None      MDM   Final diagnoses:  Cerumen impaction   Cerumen impaction, bilaterally. No fever or illness. Ears irrigated bilaterally and pt reports that he is feeling much better.      Irish EldersKelly Quinlee Sciarra, NP 01/10/14 2152

## 2014-01-13 NOTE — ED Provider Notes (Signed)
Medical screening examination/treatment/procedure(s) were performed by non-physician practitioner and as supervising physician I was immediately available for consultation/collaboration.   EKG Interpretation None       Eugene HutchingBrian Rhianne Soman, MD 01/13/14 [phone]

## 2014-01-14 ENCOUNTER — Emergency Department (HOSPITAL_COMMUNITY)
Admission: EM | Admit: 2014-01-14 | Discharge: 2014-01-14 | Disposition: A | Discharge status: Home / Self Care | Payer: Medicaid Other | Attending: Emergency Medicine | Admitting: Emergency Medicine

## 2014-01-14 ENCOUNTER — Encounter (HOSPITAL_COMMUNITY): Payer: Self-pay | Admitting: Emergency Medicine

## 2014-01-14 DIAGNOSIS — K047 Periapical abscess without sinus: Secondary | ICD-10-CM | POA: Insufficient documentation

## 2014-01-14 DIAGNOSIS — Z8659 Personal history of other mental and behavioral disorders: Secondary | ICD-10-CM | POA: Insufficient documentation

## 2014-01-14 DIAGNOSIS — IMO0002 Reserved for concepts with insufficient information to code with codable children: Secondary | ICD-10-CM | POA: Insufficient documentation

## 2014-01-14 DIAGNOSIS — Z79899 Other long term (current) drug therapy: Secondary | ICD-10-CM | POA: Insufficient documentation

## 2014-01-14 DIAGNOSIS — J45909 Unspecified asthma, uncomplicated: Secondary | ICD-10-CM | POA: Insufficient documentation

## 2014-01-14 DIAGNOSIS — Z791 Long term (current) use of non-steroidal anti-inflammatories (NSAID): Secondary | ICD-10-CM | POA: Insufficient documentation

## 2014-01-14 DIAGNOSIS — Z880 Allergy status to penicillin: Secondary | ICD-10-CM | POA: Insufficient documentation

## 2014-01-14 MED ORDER — TRAMADOL HCL 50 MG PO TABS: 50.0000 mg | ORAL_TABLET | Freq: Four times a day (QID) | ORAL | Status: DC | PRN

## 2014-01-14 MED ORDER — CLINDAMYCIN HCL 150 MG PO CAPS: 150.0000 mg | ORAL_CAPSULE | Freq: Four times a day (QID) | ORAL | Status: DC

## 2014-01-14 MED ORDER — TRAMADOL HCL 50 MG PO TABS: 50.0000 mg | ORAL_TABLET | Freq: Once | ORAL | Status: DC

## 2014-01-14 NOTE — ED Notes (Signed)
PT C/O DENTAL PAIN TO THE TEETH IN THE LOWER PART OF HIS MOUTH.

## 2014-01-14 NOTE — Discharge Instructions (Signed)
°  Dental Abscess °A dental abscess is a collection of infected fluid (pus) from a bacterial infection in the inner part of the tooth (pulp). It usually occurs at the end of the tooth's root.  °CAUSES  °· Severe tooth decay. °· Trauma to the tooth that allows bacteria to enter into the pulp, such as a broken or chipped tooth. °SYMPTOMS  °· Severe pain in and around the infected tooth. °· Swelling and redness around the abscessed tooth or in the mouth or face. °· Tenderness. °· Pus drainage. °· Bad breath. °· Bitter taste in the mouth. °· Difficulty swallowing. °· Difficulty opening the mouth. °· Nausea. °· Vomiting. °· Chills. °· Swollen neck glands. °DIAGNOSIS  °· A medical and dental history will be taken. °· An examination will be performed by tapping on the abscessed tooth. °· X-rays may be taken of the tooth to identify the abscess. °TREATMENT °The goal of treatment is to eliminate the infection. You may be prescribed antibiotic medicine to stop the infection from spreading. A root canal may be performed to save the tooth. If the tooth cannot be saved, it may be pulled (extracted) and the abscess may be drained.  °HOME CARE INSTRUCTIONS °· Only take over-the-counter or prescription medicines for pain, fever, or discomfort as directed by your caregiver. °· Rinse your mouth (gargle) often with salt water (¼ tsp salt in 8 oz [250 ml] of warm water) to relieve pain or swelling. °· Do not drive after taking pain medicine (narcotics). °· Do not apply heat to the outside of your face. °· Return to your dentist for further treatment as directed. °SEEK MEDICAL CARE IF: °· Your pain is not helped by medicine. °· Your pain is getting worse instead of better. °SEEK IMMEDIATE MEDICAL CARE IF: °· You have a fever or persistent symptoms for more than 2 3 days. °· You have a fever and your symptoms suddenly get worse. °· You have chills or a very bad headache. °· You have problems breathing or swallowing. °· You have trouble  opening your mouth. °· You have swelling in the neck or around the eye. °Document Released: 10/05/2005 Document Revised: 06/29/2012 Document Reviewed: 01/13/2011 °Exit[employer]® Patient Information ©2014 [employer]. ° ° °

## 2014-01-14 NOTE — ED Provider Notes (Signed)
CSN: [id number]     Arrival date & time 01/14/14  2016 History   First MD Initiated Contact with Patient 01/14/14 2101     Chief Complaint  Patient presents with  . Dental Pain     (Consider location/radiation/quality/duration/timing/severity/associated sxs/prior Treatment) HPI Comments: Eugene Hicks is a 23 y.o. Male with frequent ed visits presenting with right lower dental pain and gingival swelling which worsened yesterday.  He has a history of severe dental decay and is undergoing incremental dental extractions by Dr. Retta MacMohorn.  He has taken 3 leftover clindamycin since yesterday (left over from a previous dental infection) and has run out of this medicine.  He has taken no medicines for pain.  He denies fevers or chills, nausea or vomiting.  There has been no drainage from the infection site.      The history is provided by the patient.    Past Medical History  Diagnosis Date  . ADHD (attention deficit hyperactivity disorder)   . Back pain   . Shoulder pain   . Asthma    Past Surgical History  Procedure Laterality Date  . Upper gi endoscopy     Family History  Problem Relation Age of Onset  . Stroke Father   . Seizures Mother   . Seizures Brother    History  Substance Use Topics  . Smoking status: Never Smoker   . Smokeless tobacco: Never Used  . Alcohol Use: No    Review of Systems  Constitutional: Negative for fever.  HENT: Positive for dental problem. Negative for facial swelling and sore throat.   Respiratory: Negative for shortness of breath.   Musculoskeletal: Negative for neck pain and neck stiffness.      Allergies  Codeine; Lactose intolerance (gi); Percocet; and Penicillins  Home Medications   Current Outpatient Rx  Name  Route  Sig  Dispense  Refill  . alprazolam (XANAX) 2 MG tablet   Oral   Take 2 mg by mouth 4 (four) times daily as needed. For anxiety         . benzonatate (TESSALON) 200 MG capsule   Oral   Take 1 capsule (200 mg  total) by mouth 3 (three) times daily as needed for cough.   30 capsule   0   . clindamycin (CLEOCIN) 150 MG capsule   Oral   Take 1 capsule (150 mg total) by mouth every 6 (six) hours.   28 capsule   0   . diclofenac (VOLTAREN) 75 MG EC tablet   Oral   Take 1 tablet (75 mg total) by mouth 2 (two) times daily.   12 tablet   0   . dicyclomine (BENTYL) 10 MG capsule   Oral   Take 1-2 capsules (10-20 mg total) by mouth 3 (three) times daily as needed for spasms.   30 capsule   0   . EPINEPHrine (EPI-PEN) 0.3 mg/0.3 mL SOAJ injection   Intramuscular   Inject 0.3 mLs (0.3 mg total) into the muscle once.   1 Device   2   . fluticasone (FLONASE) 50 MCG/ACT nasal spray   Nasal   Place 2 sprays into the nose daily.           . hydrOXYzine (ATARAX/VISTARIL) 25 MG tablet   Oral   Take 2 tablets (50 mg total) by mouth at bedtime as needed for itching.   12 tablet   0   . lithium carbonate 300 MG capsule   Oral  Take 300-600 mg by mouth 2 (two) times daily. Take one capsule (300mg ) every day in the morning, and take two capsules (600mg ) every day at bedtime         . propranolol (INNOPRAN XL) 80 MG 24 hr capsule   Oral   Take 80 mg by mouth daily.          . QUEtiapine (SEROQUEL) 300 MG tablet   Oral   Take 300 mg by mouth every evening.          . ranitidine (ZANTAC) 150 MG capsule   Oral   Take 1 capsule (150 mg total) by mouth daily.   15 capsule   0   . traMADol (ULTRAM) 50 MG tablet   Oral   Take 1 tablet (50 mg total) by mouth every 6 (six) hours as needed.   15 tablet   0    BP 107/49  Pulse 68  Temp(Src) 97.9 F (36.6 C) (Oral)  Resp 24  Ht 5\' 8"  (1.727 m)  Wt 140 lb (63.504 kg)  BMI 21.29 kg/m2  SpO2 99% Physical Exam  Constitutional: He is oriented to person, place, and time. He appears well-developed and well-nourished. No distress.  HENT:  Head: Normocephalic and atraumatic.  Right Ear: Tympanic membrane and external ear normal.   Left Ear: Tympanic membrane and external ear normal.  Mouth/Throat: Oropharynx is clear and moist and mucous membranes are normal. No oral lesions. No trismus in the jaw. Dental abscesses present.  Poor dentition with teeth worn and/or fractured to the gingiva.  Edema and ttp right lower gingiva.  No fluctuance or drainage.  Eyes: Conjunctivae are normal.  Neck: Normal range of motion. Neck supple.  Cardiovascular: Normal rate and normal heart sounds.   Pulmonary/Chest: Effort normal.  Abdominal: He exhibits no distension.  Musculoskeletal: Normal range of motion.  Lymphadenopathy:       Head (right side): Submandibular adenopathy present.    He has no cervical adenopathy.  Neurological: He is alert and oriented to person, place, and time.  Skin: Skin is warm and dry. No erythema.  Psychiatric: He has a normal mood and affect.    ED Course  Dental Date/Time: 01/14/2014 9:25 PM Performed by: Burgess Amor Authorized by: Burgess Amor Risks and benefits: risks, benefits and alternatives were discussed Consent given by: patient Patient identity confirmed: verbally with patient Local anesthesia used: yes Anesthesia: nerve block Local anesthetic: bupivacaine 0.5% without epinephrine Anesthetic total: 1 ml Patient tolerance: Patient tolerated the procedure well with no immediate complications. Comments: Complete pain resolution obtained   (including critical care time) Labs Review Labs Reviewed - No data to display Imaging Review No results found.   EKG Interpretation None      MDM   Final diagnoses:  Dental abscess   Pt was prescribed additional clindamycin, instructed to complete entire course.  Prescribed tramadol for pain.  Infection site not amenable to I & D, no fluctuance or pus pocket appreciated.  Encouraged f/u with Dr. Aline August as planned.    Burgess Amor, PA-C 01/17/14 1425  Medical screening examination/treatment/procedure(s) were performed by non-physician  practitioner and as supervising physician I was immediately available for consultation/collaboration.   EKG Interpretation None       Donnetta Hutching, MD 01/18/14 [phone]

## 2014-02-13 ENCOUNTER — Emergency Department (HOSPITAL_COMMUNITY)
Admission: EM | Admit: 2014-02-13 | Discharge: 2014-02-13 | Disposition: A | Discharge status: Home / Self Care | Payer: Medicaid Other | Attending: Emergency Medicine | Admitting: Emergency Medicine

## 2014-02-13 ENCOUNTER — Encounter (HOSPITAL_COMMUNITY): Payer: Self-pay | Admitting: Emergency Medicine

## 2014-02-13 DIAGNOSIS — Z79899 Other long term (current) drug therapy: Secondary | ICD-10-CM | POA: Insufficient documentation

## 2014-02-13 DIAGNOSIS — Z791 Long term (current) use of non-steroidal anti-inflammatories (NSAID): Secondary | ICD-10-CM | POA: Insufficient documentation

## 2014-02-13 DIAGNOSIS — IMO0002 Reserved for concepts with insufficient information to code with codable children: Secondary | ICD-10-CM | POA: Insufficient documentation

## 2014-02-13 DIAGNOSIS — J45909 Unspecified asthma, uncomplicated: Secondary | ICD-10-CM | POA: Insufficient documentation

## 2014-02-13 DIAGNOSIS — Z880 Allergy status to penicillin: Secondary | ICD-10-CM | POA: Insufficient documentation

## 2014-02-13 DIAGNOSIS — Z8659 Personal history of other mental and behavioral disorders: Secondary | ICD-10-CM | POA: Insufficient documentation

## 2014-02-13 DIAGNOSIS — Y929 Unspecified place or not applicable: Secondary | ICD-10-CM | POA: Insufficient documentation

## 2014-02-13 DIAGNOSIS — T63461A Toxic effect of venom of wasps, accidental (unintentional), initial encounter: Secondary | ICD-10-CM

## 2014-02-13 DIAGNOSIS — Y939 Activity, unspecified: Secondary | ICD-10-CM | POA: Insufficient documentation

## 2014-02-13 DIAGNOSIS — T6391XA Toxic effect of contact with unspecified venomous animal, accidental (unintentional), initial encounter: Secondary | ICD-10-CM | POA: Insufficient documentation

## 2014-02-13 MED ORDER — PREDNISONE 20 MG PO TABS: ORAL_TABLET | ORAL | Status: DC

## 2014-02-13 MED ORDER — DEXAMETHASONE SODIUM PHOSPHATE 10 MG/ML IJ SOLN
10.0000 mg | Freq: Once | INTRAMUSCULAR | Status: AC
  Administered 2014-02-13: 10 mg via INTRAMUSCULAR
  Filled 2014-02-13: qty 1

## 2014-02-13 MED ORDER — DIPHENHYDRAMINE HCL 25 MG PO CAPS
25.0000 mg | ORAL_CAPSULE | Freq: Once | ORAL | Status: AC
  Administered 2014-02-13: 25 mg via ORAL
  Filled 2014-02-13: qty 1

## 2014-02-13 MED ORDER — FAMOTIDINE 20 MG PO TABS
20.0000 mg | ORAL_TABLET | Freq: Once | ORAL | Status: AC
  Administered 2014-02-13: 20 mg via ORAL
  Filled 2014-02-13: qty 1

## 2014-02-13 NOTE — ED Provider Notes (Signed)
CSN: [id number]     Arrival date & time 02/13/14  1848 History   First MD Initiated Contact with Patient 02/13/14 1944     Chief Complaint  Patient presents with  . Insect Bite     (Consider location/radiation/quality/duration/timing/severity/associated sxs/prior Treatment) HPI Comments: Eugene Hicks is a 23 y.o. Male presenting with yellow jacket sting x2 occurring one hour before arrival.  He has one sting on his right lower abdomen and the other on his mid back.  He describes constant pain at the site of his stings but denies shortness of breath, cough or wheezing. Additionally he denies facial, lip, tongue or throat swelling.  He has taken one Benadryl capsule prior to arrival.  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      The history is provided by the patient.    Past Medical History  Diagnosis Date  . ADHD (attention deficit hyperactivity disorder)   . Back pain   . Shoulder pain   . Asthma    Past Surgical History  Procedure Laterality Date  . Upper gi endoscopy     Family History  Problem Relation Age of Onset  . Stroke Father   . Seizures Mother   . Seizures Brother    History  Substance Use Topics  . Smoking status: Never Smoker   . Smokeless tobacco: Never Used  . Alcohol Use: No    Review of Systems  Constitutional: Negative for fever and chills.  HENT: Negative for facial swelling, trouble swallowing and voice change.   Respiratory: Negative for chest tightness, shortness of breath, wheezing and stridor.   Skin: Positive for wound.  Neurological: Negative for numbness.      Allergies  Codeine; Lactose intolerance (gi); Percocet; and Penicillins  Home Medications   Prior to Admission medications   Medication Sig Start Date End Date Taking? Authorizing Provider  alprazolam Prudy Feeler(XANAX) 2 MG tablet Take 2 mg by mouth 4 (four) times daily as needed. For anxiety    Historical Provider, MD  benzonatate (TESSALON) 200 MG capsule Take 1  capsule (200 mg total) by mouth 3 (three) times daily as needed for cough. 12/05/13   Burgess AmorJulie Aniza Shor, PA-C  clindamycin (CLEOCIN) 150 MG capsule Take 1 capsule (150 mg total) by mouth every 6 (six) hours. 01/14/14   Burgess AmorJulie Emilyn Ruble, PA-C  diclofenac (VOLTAREN) 75 MG EC tablet Take 1 tablet (75 mg total) by mouth 2 (two) times daily. 11/20/13   Kathie DikeHobson M Bryant, PA-C  dicyclomine (BENTYL) 10 MG capsule Take 1-2 capsules (10-20 mg total) by mouth 3 (three) times daily as needed for spasms. 11/25/13   Burgess AmorJulie Hendrix Yurkovich, PA-C  EPINEPHrine (EPI-PEN) 0.3 mg/0.3 mL SOAJ injection Inject 0.3 mLs (0.3 mg total) into the muscle once. 07/26/13   Donnetta HutchingBrian Cook, MD  fluticasone (FLONASE) 50 MCG/ACT nasal spray Place 2 sprays into the nose daily.      Historical Provider, MD  hydrOXYzine (ATARAX/VISTARIL) 25 MG tablet Take 2 tablets (50 mg total) by mouth at bedtime as needed for itching. 06/17/13   Sunnie NielsenBrian Opitz, MD  lithium carbonate 300 MG capsule Take 300-600 mg by mouth 2 (two) times daily. Take one capsule (300mg ) every day in the morning, and take two capsules (600mg ) every day at bedtime    Historical Provider, MD  propranolol (INNOPRAN XL) 80 MG 24 hr capsule Take 80 mg by mouth daily.     Historical Provider, MD  QUEtiapine (SEROQUEL) 300 MG tablet Take 300 mg by mouth  every evening.     Historical Provider, MD  ranitidine (ZANTAC) 150 MG capsule Take 1 capsule (150 mg total) by mouth daily. 06/13/13   Benny LennertJoseph L Zammit, MD  traMADol (ULTRAM) 50 MG tablet Take 1 tablet (50 mg total) by mouth every 6 (six) hours as needed. 01/14/14   Burgess AmorJulie Timiya Howells, PA-C   BP 95/53  Pulse 55  Temp(Src) 98.6 F (37 C) (Oral)  Ht 5\' 8"  (1.727 m)  Wt 137 lb (62.143 kg)  BMI 20.84 kg/m2  SpO2 100% Physical Exam  Nursing note and vitals reviewed. Constitutional: He appears well-developed and well-nourished.  HENT:  Head: Normocephalic and atraumatic.  Eyes: Conjunctivae are normal.  Neck: Normal range of motion.  Cardiovascular: Normal rate, regular  rhythm, normal heart sounds and intact distal pulses.   Pulmonary/Chest: Effort normal and breath sounds normal. No stridor. No respiratory distress. He has no wheezes.  Abdominal: Soft. Bowel sounds are normal.  Musculoskeletal: Normal range of motion.  Neurological: He is alert.  Skin: Skin is warm and dry.  Small point of erythema right lower abdomen surrounded by green magic marker.  No edema, induration or red streaking.  Unable to visualize site of bite on his back.  Psychiatric: He has a normal mood and affect.    ED Course  Procedures (including critical care time) Labs Review Labs Reviewed - No data to display  Imaging Review No results found.   EKG Interpretation None      MDM   Final diagnoses:  None    Patient was given Benadryl 25 mg prior to arrival.  He he had no respiratory symptoms or exam findings suggesting anaphylaxis at this time.  He was given additional Benadryl 25 mg, Pepcid 20 mg and Decadron 10 mg IM.  He'll be observed for any signs of defervescence.  Pt stable during ed with no progression of sx.  He had no swelling, no sob, wheezing or stridor when reexamined almost 2 hours into his ed visit,  3 hours before the yellow jacket exposure.  He was prescribed prednisone and encouraged to continue his home benadryl prn every 6 hours for the next day.    The patient appears reasonably screened and/or stabilized for discharge and I doubt any other medical condition or other [facility] requiring further screening, evaluation, or treatment in the ED at this time prior to discharge.     Burgess AmorJulie Azelyn Batie, PA-C 02/14/14 [phone]

## 2014-02-13 NOTE — ED Notes (Signed)
Pt came in via EMS after 2 bee  Stings. 1 to rt abd and 1  To back

## 2014-02-13 NOTE — Discharge Instructions (Signed)
Insect Bite Mosquitoes, flies, fleas, bedbugs, and many other insects can bite. Insect bites are different from insect stings. A sting is when venom is injected into the skin. Some insect bites can transmit infectious diseases. SYMPTOMS  Insect bites usually turn red, swell, and itch for 2 to 4 days. They often go away on their own. TREATMENT  Your caregiver may prescribe antibiotic medicines if a bacterial infection develops in the bite. HOME CARE INSTRUCTIONS  Do not scratch the bite area.  Keep the bite area clean and dry. Wash the bite area thoroughly with soap and water.  Put ice or cool compresses on the bite area.  Put ice in a plastic bag.  Place a towel between your skin and the bag.  Leave the ice on for 20 minutes, 4 times a day for the first 2 to 3 days, or as directed.  You may apply a baking soda paste, cortisone cream, or calamine lotion to the bite area as directed by your caregiver. This can help reduce itching and swelling.  Only take over-the-counter or prescription medicines as directed by your caregiver.  If you are given antibiotics, take them as directed. Finish them even if you start to feel better. You may need a tetanus shot if:  You cannot remember when you had your last tetanus shot.  You have never had a tetanus shot.  The injury broke your skin. If you get a tetanus shot, your arm may swell, get red, and feel warm to the touch. This is common and not a problem. If you need a tetanus shot and you choose not to have one, there is a rare chance of getting tetanus. Sickness from tetanus can be serious. SEEK IMMEDIATE MEDICAL CARE IF:   You have increased pain, redness, or swelling in the bite area.  You see a red line on the skin coming from the bite.  You have a fever.  You have joint pain.  You have a headache or neck pain.  You have unusual weakness.  You have a rash.  You have chest pain or shortness of breath.  You have abdominal pain,  nausea, or vomiting.  You feel unusually tired or sleepy. MAKE SURE YOU:   Understand these instructions.  Will watch your condition.  Will get help right away if you are not doing well or get worse. Document Released: 11/12/2004 Document Revised: 12/28/2011 Document Reviewed: 05/06/2011 [facility] Patient Information 2014 [place], [place].   Take your next dose of prednisone tomorrow evening.  You may continue taking Benadryl 25 mg every 6 hours if you are itching or have any other symptoms associated with this yellow jacket sting.  Return here for any worsened symptoms.

## 2014-02-16 NOTE — ED Provider Notes (Signed)
Medical screening examination/treatment/procedure(s) were performed by non-physician practitioner and as supervising physician I was immediately available for consultation/collaboration.    Kuron Docken R Dontrail Blackwell, MD 02/16/14 1547 

## 2014-03-06 ENCOUNTER — Encounter (HOSPITAL_COMMUNITY): Payer: Self-pay | Admitting: Emergency Medicine

## 2014-03-06 ENCOUNTER — Emergency Department (HOSPITAL_COMMUNITY)
Admission: EM | Admit: 2014-03-06 | Discharge: 2014-03-06 | Disposition: A | Discharge status: Home / Self Care | Payer: Medicaid Other | Attending: Emergency Medicine | Admitting: Emergency Medicine

## 2014-03-06 DIAGNOSIS — J45909 Unspecified asthma, uncomplicated: Secondary | ICD-10-CM | POA: Insufficient documentation

## 2014-03-06 DIAGNOSIS — R109 Unspecified abdominal pain: Secondary | ICD-10-CM

## 2014-03-06 DIAGNOSIS — Z9889 Other specified postprocedural states: Secondary | ICD-10-CM | POA: Insufficient documentation

## 2014-03-06 DIAGNOSIS — Z880 Allergy status to penicillin: Secondary | ICD-10-CM | POA: Insufficient documentation

## 2014-03-06 DIAGNOSIS — Z792 Long term (current) use of antibiotics: Secondary | ICD-10-CM | POA: Insufficient documentation

## 2014-03-06 DIAGNOSIS — IMO0002 Reserved for concepts with insufficient information to code with codable children: Secondary | ICD-10-CM | POA: Insufficient documentation

## 2014-03-06 DIAGNOSIS — Z7982 Long term (current) use of aspirin: Secondary | ICD-10-CM | POA: Insufficient documentation

## 2014-03-06 DIAGNOSIS — R1084 Generalized abdominal pain: Secondary | ICD-10-CM | POA: Insufficient documentation

## 2014-03-06 DIAGNOSIS — F909 Attention-deficit hyperactivity disorder, unspecified type: Secondary | ICD-10-CM | POA: Insufficient documentation

## 2014-03-06 DIAGNOSIS — R110 Nausea: Secondary | ICD-10-CM | POA: Insufficient documentation

## 2014-03-06 DIAGNOSIS — R6883 Chills (without fever): Secondary | ICD-10-CM | POA: Insufficient documentation

## 2014-03-06 DIAGNOSIS — Z79899 Other long term (current) drug therapy: Secondary | ICD-10-CM | POA: Insufficient documentation

## 2014-03-06 LAB — URINALYSIS, ROUTINE W REFLEX MICROSCOPIC
BILIRUBIN URINE: NEGATIVE
Glucose, UA: NEGATIVE mg/dL
HGB URINE DIPSTICK: NEGATIVE
Ketones, ur: NEGATIVE mg/dL
Leukocytes, UA: NEGATIVE
Nitrite: NEGATIVE
PROTEIN: NEGATIVE mg/dL
SPECIFIC GRAVITY, URINE: 1.01 (ref 1.005–1.030)
Urobilinogen, UA: 0.2 mg/dL (ref 0.0–1.0)
pH: 6 (ref 5.0–8.0)

## 2014-03-06 MED ORDER — FAMOTIDINE 20 MG PO TABS
20.0000 mg | ORAL_TABLET | Freq: Once | ORAL | Status: AC
  Administered 2014-03-06: 20 mg via ORAL
  Filled 2014-03-06: qty 1

## 2014-03-06 MED ORDER — ONDANSETRON 4 MG PO TBDP
4.0000 mg | ORAL_TABLET | Freq: Once | ORAL | Status: AC
  Administered 2014-03-06: 4 mg via ORAL
  Filled 2014-03-06: qty 1

## 2014-03-06 MED ORDER — ONDANSETRON HCL 4 MG PO TABS: 4.0000 mg | ORAL_TABLET | Freq: Four times a day (QID) | ORAL | Status: DC

## 2014-03-06 MED ORDER — FAMOTIDINE 20 MG PO TABS: 20.0000 mg | ORAL_TABLET | Freq: Two times a day (BID) | ORAL | Status: DC

## 2014-03-06 NOTE — Discharge Instructions (Signed)
Stay on clear liquids for the next few hours and then advance to a BRAT diet. Take the medications as directed. Follow up with your doctor.   Abdominal Pain, Adult Many things can cause abdominal pain. Usually, abdominal pain is not caused by a disease and will improve without treatment. It can often be observed and treated at home. Your health care provider will do a physical exam and possibly order blood tests and X-rays to help determine the seriousness of your pain. However, in many cases, more time must pass before a clear cause of the pain can be found. Before that point, your health care provider may not know if you need more testing or further treatment. HOME CARE INSTRUCTIONS  Monitor your abdominal pain for any changes. The following actions may help to alleviate any discomfort you are experiencing:  Only take over-the-counter or prescription medicines as directed by your health care provider.  Do not take laxatives unless directed to do so by your health care provider.  Try a clear liquid diet (broth, tea, or water) as directed by your health care provider. Slowly move to a bland diet as tolerated. SEEK MEDICAL CARE IF:  You have unexplained abdominal pain.  You have abdominal pain associated with nausea or diarrhea.  You have pain when you urinate or have a bowel movement.  You experience abdominal pain that wakes you in the night.  You have abdominal pain that is worsened or improved by eating food.  You have abdominal pain that is worsened with eating fatty foods. SEEK IMMEDIATE MEDICAL CARE IF:   Your pain does not go away within 2 hours.  You have a fever.  You keep throwing up (vomiting).  Your pain is felt only in portions of the abdomen, such as the right side or the left lower portion of the abdomen.  You pass bloody or black tarry stools. MAKE SURE YOU:  Understand these instructions.   Will watch your condition.   Will get help right away if you are  not doing well or get worse.  Document Released: 07/15/2005 Document Revised: 07/26/2013 Document Reviewed: 06/14/2013 [facility] Patient Information 2014 [place], [place].

## 2014-03-06 NOTE — ED Provider Notes (Signed)
Pt and father describe berry with multiple small dark berries clustered together.  Not individual berries. Not red like hollyberries.  Not red stemmed like nightshade. He is not having neurological symptoms, tremmor, or vision changes.  He had some GI upset that resolved with Zofran.  States that he ate  "2, 3, or4".    OK for DC.  Pt several hours post ingestion.  RTER with any additional symptoms.  Rolland PorterMark Jaymond Waage, MD 03/06/14 [phone]

## 2014-03-06 NOTE — ED Notes (Signed)
Pt ate a handful of unknown berries that pt got from the woods, pt unable to state what kind of berries, pt c/o abd pain, denies N/v

## 2014-03-06 NOTE — ED Provider Notes (Signed)
CSN: [id number]     Arrival date & time 03/06/14  1538 History   First MD Initiated Contact with Patient 03/06/14 1606     Chief Complaint  Patient presents with  . Abdominal Pain     (Consider location/radiation/quality/duration/timing/severity/associated sxs/prior Treatment) Patient is a 23 y.o. male presenting with abdominal pain. The history is provided by the patient and a parent.  Abdominal Pain Pain location:  Generalized Pain quality: aching   Pain radiates to:  Does not radiate Pain severity:  Moderate Onset quality:  Gradual Duration:  2 hours Timing:  Constant Progression:  Unchanged Chronicity:  New Context: eating   Relieved by:  None tried Worsened by:  Nothing tried Ineffective treatments:  None tried Associated symptoms: chills and nausea   Associated symptoms: no chest pain, no constipation, no cough, no diarrhea, no dysuria, no fever and no vomiting    Eugene Hicks is a 23 y.o. male who presents to the ED with abdominal pain. He was in the woods today and ate berries. Not sure what kind and now has pain and chills. This was approximately 2 hours prior to arrival to the ED.  Past Medical History  Diagnosis Date  . ADHD (attention deficit hyperactivity disorder)   . Back pain   . Shoulder pain   . Asthma    Past Surgical History  Procedure Laterality Date  . Upper gi endoscopy     Family History  Problem Relation Age of Onset  . Stroke Father   . Seizures Mother   . Seizures Brother    History  Substance Use Topics  . Smoking status: Never Smoker   . Smokeless tobacco: Never Used  . Alcohol Use: No    Review of Systems  Constitutional: Positive for chills. Negative for fever.  HENT: Negative.   Eyes: Negative for visual disturbance.  Respiratory: Negative for cough.   Cardiovascular: Negative for chest pain.  Gastrointestinal: Positive for nausea and abdominal pain. Negative for vomiting, diarrhea and constipation.  Genitourinary:  Negative for dysuria and urgency.  Musculoskeletal: Negative for back pain.  Skin: Negative for rash.  Psychiatric/Behavioral: Negative for confusion.      Allergies  Codeine; Lactose intolerance (gi); Percocet; and Penicillins  Home Medications   Prior to Admission medications   Medication Sig Start Date End Date Taking? Authorizing Provider  alprazolam Prudy Feeler(XANAX) 2 MG tablet Take 2 mg by mouth 4 (four) times daily as needed. For anxiety    Historical Provider, MD  benzonatate (TESSALON) 200 MG capsule Take 1 capsule (200 mg total) by mouth 3 (three) times daily as needed for cough. 12/05/13   Burgess AmorJulie Idol, PA-C  clindamycin (CLEOCIN) 150 MG capsule Take 1 capsule (150 mg total) by mouth every 6 (six) hours. 01/14/14   Burgess AmorJulie Idol, PA-C  diclofenac (VOLTAREN) 75 MG EC tablet Take 1 tablet (75 mg total) by mouth 2 (two) times daily. 11/20/13   Kathie DikeHobson M Bryant, PA-C  dicyclomine (BENTYL) 10 MG capsule Take 1-2 capsules (10-20 mg total) by mouth 3 (three) times daily as needed for spasms. 11/25/13   Burgess AmorJulie Idol, PA-C  EPINEPHrine (EPI-PEN) 0.3 mg/0.3 mL SOAJ injection Inject 0.3 mLs (0.3 mg total) into the muscle once. 07/26/13   Donnetta HutchingBrian Cook, MD  fluticasone (FLONASE) 50 MCG/ACT nasal spray Place 2 sprays into the nose daily.      Historical Provider, MD  hydrOXYzine (ATARAX/VISTARIL) 25 MG tablet Take 2 tablets (50 mg total) by mouth at bedtime as needed for itching. 06/17/13  Sunnie NielsenBrian Opitz, MD  lithium carbonate 300 MG capsule Take 300-600 mg by mouth 2 (two) times daily. Take one capsule (300mg ) every day in the morning, and take two capsules (600mg ) every day at bedtime    Historical Provider, MD  predniSONE (DELTASONE) 20 MG tablet 3 tabs by mouth daily for 3 days 02/13/14   Burgess AmorJulie Idol, PA-C  propranolol (INNOPRAN XL) 80 MG 24 hr capsule Take 80 mg by mouth daily.     Historical Provider, MD  QUEtiapine (SEROQUEL) 300 MG tablet Take 300 mg by mouth every evening.     Historical Provider, MD  ranitidine  (ZANTAC) 150 MG capsule Take 1 capsule (150 mg total) by mouth daily. 06/13/13   Benny LennertJoseph L Zammit, MD  traMADol (ULTRAM) 50 MG tablet Take 1 tablet (50 mg total) by mouth every 6 (six) hours as needed. 01/14/14   Burgess AmorJulie Idol, PA-C   BP 109/65  Pulse 61  Temp(Src) 97.5 F (36.4 C) (Oral)  Resp 20  Wt 137 lb (62.143 kg)  SpO2 100% Physical Exam  Nursing note and vitals reviewed. Constitutional: He is oriented to person, place, and time. He appears well-developed and well-nourished.  HENT:  Head: Normocephalic and atraumatic.  Right Ear: Tympanic membrane normal.  Left Ear: Tympanic membrane normal.  Nose: Nose normal.  Mouth/Throat: Uvula is midline, oropharynx is clear and moist and mucous membranes are normal.  Eyes: Conjunctivae and EOM are normal.  Neck: Normal range of motion. Neck supple.  Cardiovascular: Normal rate and regular rhythm.   Pulmonary/Chest: Effort normal and breath sounds normal.  Abdominal: Soft. Bowel sounds are increased. There is generalized tenderness. There is no rebound, no guarding and no CVA tenderness.  Musculoskeletal: Normal range of motion.  Neurological: He is alert and oriented to person, place, and time. No cranial nerve deficit.  Skin: Skin is warm and dry.  Psychiatric: He has a normal mood and affect. His behavior is normal.    ED Course  Procedures (including critical care time) Results for orders placed during the hospital encounter of 03/06/14 (from the past 24 hour(s))  URINALYSIS, ROUTINE W REFLEX MICROSCOPIC     Status: None   Collection Time    03/06/14  4:24 PM      Result Value Ref Range   Color, Urine YELLOW  YELLOW   APPearance CLEAR  CLEAR   Specific Gravity, Urine 1.010  1.005 - 1.030   pH 6.0  5.0 - 8.0   Glucose, UA NEGATIVE  NEGATIVE mg/dL   Hgb urine dipstick NEGATIVE  NEGATIVE   Bilirubin Urine NEGATIVE  NEGATIVE   Ketones, ur NEGATIVE  NEGATIVE mg/dL   Protein, ur NEGATIVE  NEGATIVE mg/dL   Urobilinogen, UA 0.2  0.0 -  1.0 mg/dL   Nitrite NEGATIVE  NEGATIVE   Leukocytes, UA NEGATIVE  NEGATIVE    Dr. Fayrene FearingJames in to examine the patient.  MDM  23 y.o. male with abdominal pain after eating berries in the woods. No fever, no vomiting or diarrhea. Stable for discharge without nausea at this time. Will continue treatment with Zofran and Pepcid he will return if symptoms worsen.    [address]Hope East PecosM Ne[place], [place]NP 03/08/14 [phone]

## 2014-03-10 NOTE — ED Provider Notes (Signed)
Medical screening examination/treatment/procedure(s) were performed by non-physician practitioner and as supervising physician I was immediately available for consultation/collaboration.   EKG Interpretation None        Rolland Porter, MD 03/10/14 1007

## 2014-04-08 ENCOUNTER — Encounter (HOSPITAL_COMMUNITY): Payer: Self-pay | Admitting: Emergency Medicine

## 2014-04-08 ENCOUNTER — Emergency Department (HOSPITAL_COMMUNITY)
Admission: EM | Admit: 2014-04-08 | Discharge: 2014-04-08 | Disposition: A | Discharge status: Home / Self Care | Payer: Medicaid Other | Attending: Emergency Medicine | Admitting: Emergency Medicine

## 2014-04-08 DIAGNOSIS — IMO0002 Reserved for concepts with insufficient information to code with codable children: Secondary | ICD-10-CM | POA: Insufficient documentation

## 2014-04-08 DIAGNOSIS — Z79899 Other long term (current) drug therapy: Secondary | ICD-10-CM | POA: Insufficient documentation

## 2014-04-08 DIAGNOSIS — H9201 Otalgia, right ear: Secondary | ICD-10-CM

## 2014-04-08 DIAGNOSIS — Z792 Long term (current) use of antibiotics: Secondary | ICD-10-CM | POA: Insufficient documentation

## 2014-04-08 DIAGNOSIS — J45909 Unspecified asthma, uncomplicated: Secondary | ICD-10-CM | POA: Insufficient documentation

## 2014-04-08 DIAGNOSIS — R509 Fever, unspecified: Secondary | ICD-10-CM | POA: Insufficient documentation

## 2014-04-08 DIAGNOSIS — F909 Attention-deficit hyperactivity disorder, unspecified type: Secondary | ICD-10-CM | POA: Insufficient documentation

## 2014-04-08 DIAGNOSIS — Z880 Allergy status to penicillin: Secondary | ICD-10-CM | POA: Insufficient documentation

## 2014-04-08 DIAGNOSIS — H9209 Otalgia, unspecified ear: Secondary | ICD-10-CM | POA: Insufficient documentation

## 2014-04-08 MED ORDER — IBUPROFEN 800 MG PO TABS: 800.0000 mg | ORAL_TABLET | Freq: Three times a day (TID) | ORAL | Status: DC

## 2014-04-08 MED ORDER — PSEUDOEPHEDRINE HCL 60 MG PO TABS
60.0000 mg | ORAL_TABLET | Freq: Once | ORAL | Status: AC
  Administered 2014-04-08: 60 mg via ORAL
  Filled 2014-04-08: qty 1

## 2014-04-08 MED ORDER — DEXAMETHASONE 4 MG PO TABS: ORAL_TABLET | ORAL | Status: DC

## 2014-04-08 MED ORDER — IBUPROFEN 800 MG PO TABS
800.0000 mg | ORAL_TABLET | Freq: Once | ORAL | Status: AC
  Administered 2014-04-08: 800 mg via ORAL
  Filled 2014-04-08: qty 1

## 2014-04-08 MED ORDER — PREDNISONE 50 MG PO TABS
60.0000 mg | ORAL_TABLET | Freq: Once | ORAL | Status: AC
  Administered 2014-04-08: 60 mg via ORAL
  Filled 2014-04-08 (×2): qty 1

## 2014-04-08 NOTE — ED Notes (Signed)
Pt reports ear pain x2 days. Pt denies any fevers or any known injury. nad noted

## 2014-04-08 NOTE — ED Notes (Signed)
Pt with c/o pain to right ear, ear wax noted to right ear

## 2014-04-08 NOTE — ED Provider Notes (Signed)
Medical screening examination/treatment/procedure(s) were performed by non-physician practitioner and as supervising physician I was immediately available for consultation/collaboration.   EKG Interpretation None        Joseph L Zammit, MD 04/08/14 2054 

## 2014-04-08 NOTE — ED Provider Notes (Signed)
CSN: [id number]     Arrival date & time 04/08/14  1846 History   None    Chief Complaint  Patient presents with  . Otalgia   Patient is a 23 y.o. male presenting with ear pain. The history is provided by the patient. No language interpreter was used.  Otalgia Location:  Right Quality:  Aching Severity:  Mild Duration:  3 days Chronicity:  New Context: not direct blow   Associated symptoms: fever and hearing loss   Associated symptoms: no congestion, no cough, no ear discharge, no rhinorrhea and no sore throat    This chart was scribed for non-physician practitioner working with Benny LennertJoseph L Zammit, MD, by Andrew Auaven Small, ED Scribe. This patient was seen in room APFT24/APFT24 and the patient's care was started at 6:56 PM.  Eugene Hicks is a 23 y.o. male who presents to the Emergency Department complaining of right otalgia x 3 days with associated loss of hearing to right ear and subjective fever last night. Pt denies known injury. Pt reports cerumen impaction to right ears without drainage. He also denies sore throat, rhinorrhea and sinus pressure.    Past Medical History  Diagnosis Date  . ADHD (attention deficit hyperactivity disorder)   . Back pain   . Shoulder pain   . Asthma    Past Surgical History  Procedure Laterality Date  . Upper gi endoscopy     Family History  Problem Relation Age of Onset  . Stroke Father   . Seizures Mother   . Seizures Brother    History  Substance Use Topics  . Smoking status: Never Smoker   . Smokeless tobacco: Never Used  . Alcohol Use: No    Review of Systems  Constitutional: Positive for fever.  HENT: Positive for ear pain and hearing loss. Negative for congestion, ear discharge, rhinorrhea, sinus pressure and sore throat.   Respiratory: Negative for cough.   All other systems reviewed and are negative.   Allergies  Codeine; Lactose intolerance (gi); Percocet; and Penicillins  Home Medications   Prior to Admission medications    Medication Sig Start Date End Date Taking? Authorizing Eugene Hicks  alprazolam Prudy Feeler(XANAX) 2 MG tablet Take 2 mg by mouth 4 (four) times daily as needed. For anxiety    Historical Kamsiyochukwu Buist, MD  benzonatate (TESSALON) 200 MG capsule Take 1 capsule (200 mg total) by mouth 3 (three) times daily as needed for cough. 12/05/13   Burgess AmorJulie Idol, PA-C  clindamycin (CLEOCIN) 150 MG capsule Take 1 capsule (150 mg total) by mouth every 6 (six) hours. 01/14/14   Burgess AmorJulie Idol, PA-C  diclofenac (VOLTAREN) 75 MG EC tablet Take 1 tablet (75 mg total) by mouth 2 (two) times daily. 11/20/13   Kathie DikeHobson M Bryant, PA-C  dicyclomine (BENTYL) 10 MG capsule Take 1-2 capsules (10-20 mg total) by mouth 3 (three) times daily as needed for spasms. 11/25/13   Burgess AmorJulie Idol, PA-C  EPINEPHrine (EPI-PEN) 0.3 mg/0.3 mL SOAJ injection Inject 0.3 mLs (0.3 mg total) into the muscle once. 07/26/13   Donnetta HutchingBrian Cook, MD  famotidine (PEPCID) 20 MG tablet Take 1 tablet (20 mg total) by mouth 2 (two) times daily. 03/06/14   Hope Orlene OchM Neese, NP  fluticasone (FLONASE) 50 MCG/ACT nasal spray Place 2 sprays into the nose daily.      Historical Darneshia Demary, MD  hydrOXYzine (ATARAX/VISTARIL) 25 MG tablet Take 2 tablets (50 mg total) by mouth at bedtime as needed for itching. 06/17/13   Sunnie NielsenBrian Opitz, MD  lithium carbonate  300 MG capsule Take 300-600 mg by mouth 2 (two) times daily. Take one capsule (300mg ) every day in the morning, and take two capsules (600mg ) every day at bedtime    Historical Chasitty Hehl, MD  ondansetron (ZOFRAN) 4 MG tablet Take 1 tablet (4 mg total) by mouth every 6 (six) hours. 03/06/14   Hope Orlene OchM Neese, NP  predniSONE (DELTASONE) 20 MG tablet 3 tabs by mouth daily for 3 days 02/13/14   Burgess AmorJulie Idol, PA-C  propranolol (INNOPRAN XL) 80 MG 24 hr capsule Take 80 mg by mouth daily.     Historical Bonnie Overdorf, MD  QUEtiapine (SEROQUEL) 300 MG tablet Take 300 mg by mouth every evening.     Historical Ketrina Boateng, MD  ranitidine (ZANTAC) 150 MG capsule Take 1 capsule (150 mg total)  by mouth daily. 06/13/13   Benny LennertJoseph L Zammit, MD  traMADol (ULTRAM) 50 MG tablet Take 1 tablet (50 mg total) by mouth every 6 (six) hours as needed. 01/14/14   Burgess AmorJulie Idol, PA-C   BP 113/63  Pulse 83  Temp(Src) 97.6 F (36.4 C) (Oral)  Resp 24  Ht 5\' 8"  (1.727 m)  Wt 127 lb (57.607 kg)  BMI 19.31 kg/m2  SpO2 97% Physical Exam  Nursing note and vitals reviewed. Constitutional: He is oriented to person, place, and time. He appears well-developed and well-nourished. No distress.  HENT:  Head: Normocephalic and atraumatic.  Mouth/Throat: Uvula is midline and oropharynx is clear and moist.  Fluid behind right TM  Eyes: Conjunctivae and EOM are normal.  Neck: Neck supple.  Cardiovascular: Normal rate, regular rhythm and normal heart sounds.   Pulmonary/Chest: Effort normal and breath sounds normal. No respiratory distress.  Musculoskeletal: Normal range of motion.  Neurological: He is alert and oriented to person, place, and time.  Skin: Skin is warm and dry.  Psychiatric: He has a normal mood and affect. His behavior is normal.    ED Course  Procedures  7:08 PM- Pt advised of plan for treatment which includes a decongestant steroids and pt agrees.  Labs Review Labs Reviewed - No data to display  Imaging Review No results found.   EKG Interpretation None      MDM Vital signs are WNL. Pulse ox 97%. No FB of the right ear. No drainage or cerumen impaction. Mild fluid behind the TM and mild nasal congestion.  Plan - ibuprofen every 6 hours, Decadron, Sudafed for congestion. Patient is to see his primary physician, or return to the emergency department if not improving.    Final diagnoses:  None    I personally performed the services described in this documentation, which was scribed in my presence. The recorded information has been reviewed and is accurate.      Kathie DikeHobson M Bryant, PA-C 04/08/14 1925

## 2014-04-08 NOTE — Discharge Instructions (Signed)
Your vital signs are within normal limits. There is minimal wax in your ears. You do have some fluid behind the drum. Please use the prescriptions given, and Sudafed to help improve this problem.

## 2014-05-11 ENCOUNTER — Encounter (HOSPITAL_COMMUNITY): Payer: Self-pay | Admitting: Emergency Medicine

## 2014-05-11 ENCOUNTER — Emergency Department (HOSPITAL_COMMUNITY)
Admission: EM | Admit: 2014-05-11 | Discharge: 2014-05-11 | Disposition: A | Discharge status: Home / Self Care | Payer: Medicaid Other | Attending: Emergency Medicine | Admitting: Emergency Medicine

## 2014-05-11 DIAGNOSIS — Y929 Unspecified place or not applicable: Secondary | ICD-10-CM | POA: Insufficient documentation

## 2014-05-11 DIAGNOSIS — W278XXA Contact with other nonpowered hand tool, initial encounter: Secondary | ICD-10-CM | POA: Diagnosis not present

## 2014-05-11 DIAGNOSIS — Z880 Allergy status to penicillin: Secondary | ICD-10-CM | POA: Insufficient documentation

## 2014-05-11 DIAGNOSIS — IMO0002 Reserved for concepts with insufficient information to code with codable children: Secondary | ICD-10-CM | POA: Diagnosis not present

## 2014-05-11 DIAGNOSIS — Z79899 Other long term (current) drug therapy: Secondary | ICD-10-CM | POA: Diagnosis not present

## 2014-05-11 DIAGNOSIS — Z8659 Personal history of other mental and behavioral disorders: Secondary | ICD-10-CM | POA: Insufficient documentation

## 2014-05-11 DIAGNOSIS — S6990XA Unspecified injury of unspecified wrist, hand and finger(s), initial encounter: Secondary | ICD-10-CM | POA: Insufficient documentation

## 2014-05-11 DIAGNOSIS — Z23 Encounter for immunization: Secondary | ICD-10-CM | POA: Insufficient documentation

## 2014-05-11 DIAGNOSIS — J45909 Unspecified asthma, uncomplicated: Secondary | ICD-10-CM | POA: Diagnosis not present

## 2014-05-11 DIAGNOSIS — Y9389 Activity, other specified: Secondary | ICD-10-CM | POA: Diagnosis not present

## 2014-05-11 DIAGNOSIS — S61209A Unspecified open wound of unspecified finger without damage to nail, initial encounter: Secondary | ICD-10-CM | POA: Insufficient documentation

## 2014-05-11 DIAGNOSIS — S61219A Laceration without foreign body of unspecified finger without damage to nail, initial encounter: Secondary | ICD-10-CM

## 2014-05-11 MED ORDER — TETANUS-DIPHTH-ACELL PERTUSSIS 5-2.5-18.5 LF-MCG/0.5 IM SUSP
0.5000 mL | Freq: Once | INTRAMUSCULAR | Status: AC
  Administered 2014-05-11: 0.5 mL via INTRAMUSCULAR
  Filled 2014-05-11: qty 0.5

## 2014-05-11 NOTE — ED Notes (Signed)
Pt reports cut hand with chainsaw. Two superficial lacerations noted to left pinky and left ring finger. No active bleeding noted. Limited ROM noted in triage. No swelling or deformity noted.

## 2014-05-11 NOTE — Discharge Instructions (Signed)
Laceration Care, Adult °A laceration is a cut that goes through all layers of the skin. The cut goes into the tissue beneath the skin. °HOME CARE °For stitches (sutures) or staples: °· Keep the cut clean and dry. °· If you have a bandage (dressing), change it at least once a day. Change the bandage if it gets wet or dirty, or as told by your doctor. °· Wash the cut with soap and water 2 times a day. Rinse the cut with water. Pat it dry with a clean towel. °· Put a thin layer of medicated cream on the cut as told by your doctor. °· You may shower after the first 24 hours. Do not soak the cut in water until the stitches are removed. °· Only take medicines as told by your doctor. °· Have your stitches or staples removed as told by your doctor. °For skin adhesive strips: °· Keep the cut clean and dry. °· Do not get the strips wet. You may take a bath, but be careful to keep the cut dry. °· If the cut gets wet, pat it dry with a clean towel. °· The strips will fall off on their own. Do not remove the strips that are still stuck to the cut. °For wound glue: °· You may shower or take baths. Do not soak or scrub the cut. Do not swim. Avoid heavy sweating until the glue falls off on its own. After a shower or bath, pat the cut dry with a clean towel. °· Do not put medicine on your cut until the glue falls off. °· If you have a bandage, do not put tape over the glue. °· Avoid lots of sunlight or tanning lamps until the glue falls off. Put sunscreen on the cut for the first year to reduce your scar. °· The glue will fall off on its own. Do not pick at the glue. °You may need a tetanus shot if: °· You cannot remember when you had your last tetanus shot. °· You have never had a tetanus shot. °If you need a tetanus shot and you choose not to have one, you may get tetanus. Sickness from tetanus can be serious. °GET HELP RIGHT AWAY IF:  °· Your pain does not get better with medicine. °· Your arm, hand, leg, or foot loses feeling  (numbness) or changes color. °· Your cut is bleeding. °· Your joint feels weak, or you cannot use your joint. °· You have painful lumps on your body. °· Your cut is red, puffy (swollen), or painful. °· You have a red line on the skin near the cut. °· You have yellowish-white fluid (pus) coming from the cut. °· You have a fever. °· You have a bad smell coming from the cut or bandage. °· Your cut breaks open before or after stitches are removed. °· You notice something coming out of the cut, such as wood or glass. °· You cannot move a finger or toe. °MAKE SURE YOU:  °· Understand these instructions. °· Will watch your condition. °· Will get help right away if you are not doing well or get worse. °Document Released: 03/23/2008 Document Revised: 12/28/2011 Document Reviewed: 03/31/2011 °[employer]® Patient Information ©2015 [employer]. This information is not intended to replace advice given to you by your health care provider. Make sure you discuss any questions you have with your health care provider. ° °Sterile Tape Wound Care °Some cuts and wounds can be closed using sterile tape, also called skin adhesive   strips. Skin adhesive strips can be used for shallow (superficial) and simple cuts, wounds, lacerations, and surgical incisions. These strips act in place of stitches to hold the edges of the wound together, allowing for faster healing. Unlike stitches, the adhesive strips do not require needles or anesthetic medicine for placement. The strips will wear off naturally as the wound is healing. It is important to take proper care of your wound at home while it heals.  °HOME CARE INSTRUCTIONS °· Try to keep the area around your wound clean and dry. Do not allow the adhesive strips to get wet for the first 12 hours.   °· Do not use any soaps or ointments on the wound for the first 12 hours.   °· If a bandage (dressing) has been applied, follow your health care provider's instructions for how often to change the  dressing. Keep the dressing dry if one has been applied.   °· Do not remove the adhesive strips. They will fall off on their own. If they do not, you may remove them gently after 10 days. You should gently wet the strips before removing them. For example, this can be done in the shower. °· Do not scratch, pick, or rub the wound area.   °· Protect the wound from further injury until it is healed.   °· Protect the wound from sun and tanning bed exposure while it is healing and for several weeks after healing.   °· Only take over-the-counter or prescription medicines as directed by your health care provider.   °· Keep all follow-up appointments as directed by your health care provider.   °SEEK MEDICAL CARE IF: °Your adhesive strips become wet or soaked with blood before the wound has healed. The tape will need to be replaced.  °SEEK IMMEDIATE MEDICAL CARE IF: °· You have increasing pain in the wound.   °· You develop a rash after the strips are applied. °· Your wound becomes red, swollen, hot, or tender.   °· You have a red streak that goes away from the wound.   °· You have pus coming from the wound.   °· You have increased bleeding from the wound. °· You notice a bad smell coming from the wound.   °· Your wound breaks open. °MAKE SURE YOU: °· Understand these instructions. °· Will watch your condition. °· Will get help right away if you are not doing well or get worse. °Document Released: 11/12/2004 Document Revised: 07/26/2013 Document Reviewed: 04/26/2013 °[employer]® Patient Information ©2015 [employer]. This information is not intended to replace advice given to you by your health care provider. Make sure you discuss any questions you have with your health care provider. ° °

## 2014-05-11 NOTE — ED Notes (Signed)
Patient seen and assessed by ED PA 

## 2014-05-11 NOTE — ED Provider Notes (Signed)
CSN: [id number]     Arrival date & time 05/11/14  1827 History   First MD Initiated Contact with Patient 05/11/14 1837     Chief Complaint  Patient presents with  . Hand Injury     (Consider location/radiation/quality/duration/timing/severity/associated sxs/prior Treatment)  Eugene Hicks is a 23 y.o. male who presents to the Emergency Department complaining of lacerations to the left ring and little fingers.  He states the injury occurred while using a file to sharpen a chain saw blade.  The saw was not turned on at the time of the injury.  C/o pain to the distal little finger with movement.  He also reports a "tingling sensation".  He denies numbness, excessive bleeding or swelling.  Pt is unsure of last Td.    Patient is a 23 y.o. male presenting with hand injury. The history is provided by the patient.  Hand Injury Location:  Finger Time since incident: just prior to ED arrival. Injury: yes   Finger location:  L little finger and L ring finger Pain details:    Quality:  Throbbing and sharp   Radiates to:  Does not radiate   Severity:  Moderate   Onset quality:  Sudden   Progression:  Unchanged Chronicity:  New Handedness:  Right-handed Dislocation: no   Tetanus status:  Unknown Prior injury to area:  Unable to specify Relieved by:  Nothing Worsened by:  Movement Ineffective treatments:  None tried Associated symptoms: no back pain, no decreased range of motion, no fever, no muscle weakness, no numbness, no stiffness, no swelling and no tingling     Past Medical History  Diagnosis Date  . ADHD (attention deficit hyperactivity disorder)   . Back pain   . Shoulder pain   . Asthma    Past Surgical History  Procedure Laterality Date  . Upper gi endoscopy     Family History  Problem Relation Age of Onset  . Stroke Father   . Seizures Mother   . Seizures Brother    History  Substance Use Topics  . Smoking status: Never Smoker   . Smokeless tobacco: Never Used   . Alcohol Use: No    Review of Systems  Constitutional: Negative for fever and chills.  Musculoskeletal: Negative for arthralgias, back pain, joint swelling and stiffness.  Skin: Positive for wound. Negative for color change.       Small lacerations left fourth and fifth fingers  Neurological: Negative for dizziness, weakness and numbness.  Hematological: Does not bruise/bleed easily.  All other systems reviewed and are negative.     Allergies  Codeine; Lactose intolerance (gi); Percocet; and Penicillins  Home Medications   Prior to Admission medications   Medication Sig Start Date End Date Taking? Authorizing Provider  alprazolam Prudy Feeler) 2 MG tablet Take 2 mg by mouth 4 (four) times daily as needed. For anxiety    Historical Provider, MD  dexamethasone (DECADRON) 4 MG tablet 1 po bid with food 04/08/14   Kathie Dike, PA-C  EPINEPHrine (EPI-PEN) 0.3 mg/0.3 mL SOAJ injection Inject 0.3 mLs (0.3 mg total) into the muscle once. 07/26/13   Donnetta Hutching, MD  famotidine (PEPCID) 20 MG tablet Take 1 tablet (20 mg total) by mouth 2 (two) times daily. 03/06/14   Hope Orlene Och, NP  fluticasone (FLONASE) 50 MCG/ACT nasal spray Place 2 sprays into the nose daily.      Historical Provider, MD  hydrOXYzine (ATARAX/VISTARIL) 25 MG tablet Take 2 tablets (50 mg total) by  mouth at bedtime as needed for itching. 06/17/13   Sunnie NielsenBrian Opitz, MD  ibuprofen (ADVIL,MOTRIN) 800 MG tablet Take 1 tablet (800 mg total) by mouth 3 (three) times daily. 04/08/14   Kathie DikeHobson M Bryant, PA-C  lithium carbonate 300 MG capsule Take 300-600 mg by mouth 2 (two) times daily. Take one capsule (300mg ) every day in the morning, and take two capsules (600mg ) every day at bedtime    Historical Provider, MD  propranolol (INNOPRAN XL) 80 MG 24 hr capsule Take 80 mg by mouth daily.     Historical Provider, MD  QUEtiapine (SEROQUEL) 300 MG tablet Take 300 mg by mouth every evening.     Historical Provider, MD  ranitidine (ZANTAC) 150 MG  capsule Take 1 capsule (150 mg total) by mouth daily. 06/13/13   Benny LennertJoseph L Zammit, MD   BP 112/70  Pulse 73  Temp(Src) 97.8 F (36.6 C) (Oral)  Ht 5\' 3"  (1.6 m)  Wt 135 lb (61.236 kg)  BMI 23.92 kg/m2  SpO2 100% Physical Exam  Nursing note and vitals reviewed. Constitutional: He is oriented to person, place, and time. He appears well-developed and well-nourished. No distress.  HENT:  Head: Normocephalic and atraumatic.  Cardiovascular: Normal rate, regular rhythm, normal heart sounds and intact distal pulses.   No murmur heard. Pulmonary/Chest: Effort normal and breath sounds normal. No respiratory distress.  Musculoskeletal: He exhibits no edema and no tenderness.  Neurological: He is alert and oriented to person, place, and time. He exhibits normal muscle tone. Coordination normal.  Skin: Skin is warm. Laceration noted.  Very superficial, < 1cm  lacerations to the dorsal surface of distal fourth and fifth fingers.  Bleeding controlled.  Distal sensation intact.  CR< 2 sec.      ED Course  Procedures (including critical care time) Labs Review Labs Reviewed - No data to display  Imaging Review No results found.   EKG Interpretation None      MDM   Final diagnoses:  Laceration of finger, initial encounter    Pt has very minimal scratches to the distal fourth and fifth fingers of the left hand.  NV intact. No concerning sx's for injury to the deep structures of the finger.   Lacerations were cleaned by me with saline and lac to the fifth finger was closed with steri-strip, dressing applied, Td updated.    Patient agrees to OTC tylenol if needed for pain.      Elvira Langston L. Tres Grzywacz, PA-C 05/11/14 2025

## 2014-05-11 NOTE — ED Notes (Signed)
Discharge instructions reviewed with pt, questions answered. Pt verbalized understanding.  

## 2014-05-12 NOTE — ED Provider Notes (Signed)
Medical screening examination/treatment/procedure(s) were performed by non-physician practitioner and as supervising physician I was immediately available for consultation/collaboration.   EKG Interpretation None       Donnetta HutchingBrian Darnesha Diloreto, MD 05/12/14 [phone]

## 2014-05-19 ENCOUNTER — Emergency Department (HOSPITAL_COMMUNITY)
Admission: EM | Admit: 2014-05-19 | Discharge: 2014-05-19 | Discharge status: Against Medical Advice | Payer: Medicaid Other | Attending: Emergency Medicine | Admitting: Emergency Medicine

## 2014-05-19 ENCOUNTER — Encounter (HOSPITAL_COMMUNITY): Payer: Self-pay | Admitting: Emergency Medicine

## 2014-05-19 DIAGNOSIS — J45909 Unspecified asthma, uncomplicated: Secondary | ICD-10-CM | POA: Insufficient documentation

## 2014-05-19 DIAGNOSIS — E860 Dehydration: Secondary | ICD-10-CM | POA: Diagnosis present

## 2014-05-19 NOTE — ED Notes (Signed)
Family called to state that pt was at home.  Pt decided not to wait to be treated.

## 2014-05-19 NOTE — ED Notes (Signed)
Pt reports working outside today and becoming nauseated and weak.  States that he has been trying to keep drinking fluids and eating.  Denies pain at present time.  No distress noted.

## 2014-05-22 ENCOUNTER — Emergency Department (HOSPITAL_COMMUNITY)
Admission: EM | Admit: 2014-05-22 | Discharge: 2014-05-22 | Disposition: A | Discharge status: Home / Self Care | Payer: Medicaid Other | Attending: Emergency Medicine | Admitting: Emergency Medicine

## 2014-05-22 ENCOUNTER — Encounter (HOSPITAL_COMMUNITY): Payer: Self-pay | Admitting: Emergency Medicine

## 2014-05-22 DIAGNOSIS — S3981XA Other specified injuries of abdomen, initial encounter: Secondary | ICD-10-CM | POA: Diagnosis not present

## 2014-05-22 DIAGNOSIS — Z8659 Personal history of other mental and behavioral disorders: Secondary | ICD-10-CM | POA: Insufficient documentation

## 2014-05-22 DIAGNOSIS — Z880 Allergy status to penicillin: Secondary | ICD-10-CM | POA: Diagnosis not present

## 2014-05-22 DIAGNOSIS — J45909 Unspecified asthma, uncomplicated: Secondary | ICD-10-CM | POA: Insufficient documentation

## 2014-05-22 DIAGNOSIS — Y9389 Activity, other specified: Secondary | ICD-10-CM | POA: Diagnosis not present

## 2014-05-22 DIAGNOSIS — S3991XA Unspecified injury of abdomen, initial encounter: Secondary | ICD-10-CM

## 2014-05-22 DIAGNOSIS — IMO0002 Reserved for concepts with insufficient information to code with codable children: Secondary | ICD-10-CM | POA: Insufficient documentation

## 2014-05-22 DIAGNOSIS — Y929 Unspecified place or not applicable: Secondary | ICD-10-CM | POA: Insufficient documentation

## 2014-05-22 DIAGNOSIS — Z791 Long term (current) use of non-steroidal anti-inflammatories (NSAID): Secondary | ICD-10-CM | POA: Diagnosis not present

## 2014-05-22 DIAGNOSIS — Z79899 Other long term (current) drug therapy: Secondary | ICD-10-CM | POA: Diagnosis not present

## 2014-05-22 NOTE — ED Notes (Signed)
Pt called his father upset because he was not going to have an xray taken of his abdomen. Pt's father then contacted our unit secretary to inquire about this. He then was transferred to MD caring for pt who explained why he felt there was no need for an xray.

## 2014-05-22 NOTE — ED Notes (Signed)
Pt was helping someone lift a pole, Pole flipped and struck pt in abd,  No vomiting.  No visible trauma

## 2014-05-22 NOTE — ED Notes (Signed)
Pt stated he was helping someone move a log and the log came back and hit him in the abdomen. Now, pt c/o pain rated 10/10.

## 2014-05-22 NOTE — ED Provider Notes (Addendum)
CSN: [id number]     Arrival date & time 05/22/14  1933 History   First MD Initiated Contact with Patient 05/22/14 1942     Chief Complaint  Patient presents with  . Abdominal Pain     (Consider location/radiation/quality/duration/timing/severity/associated sxs/prior Treatment) HPI 23 year old male well-known to the emergency department presents an hour and half after a log fell and hit him in the abdomen. He states he was helping a friend lift this large log when it fell and pushed him in to the ground. Denies any vomiting. When asked where his abdomen hurts he points to his left upper abdomen and then makes large circle around his entire abdomen. Has not seen any bruising.  Past Medical History  Diagnosis Date  . ADHD (attention deficit hyperactivity disorder)   . Back pain   . Shoulder pain   . Asthma    Past Surgical History  Procedure Laterality Date  . Upper gi endoscopy     Family History  Problem Relation Age of Onset  . Stroke Father   . Seizures Mother   . Seizures Brother    History  Substance Use Topics  . Smoking status: Never Smoker   . Smokeless tobacco: Never Used  . Alcohol Use: No    Review of Systems  Gastrointestinal: Positive for abdominal pain. Negative for vomiting and abdominal distention.  Skin: Negative for wound.       No bruising      Allergies  Codeine; Lactose intolerance (gi); Percocet; and Penicillins  Home Medications   Prior to Admission medications   Medication Sig Start Date End Date Taking? Authorizing Provider  alprazolam Prudy Feeler) 2 MG tablet Take 2 mg by mouth 4 (four) times daily as needed. For anxiety    Historical Provider, MD  dexamethasone (DECADRON) 4 MG tablet 1 po bid with food 04/08/14   Kathie Dike, PA-C  EPINEPHrine (EPI-PEN) 0.3 mg/0.3 mL SOAJ injection Inject 0.3 mLs (0.3 mg total) into the muscle once. 07/26/13   Donnetta Hutching, MD  famotidine (PEPCID) 20 MG tablet Take 1 tablet (20 mg total) by mouth 2 (two) times  daily. 03/06/14   Hope Orlene Och, NP  fluticasone (FLONASE) 50 MCG/ACT nasal spray Place 2 sprays into the nose daily.      Historical Provider, MD  hydrOXYzine (ATARAX/VISTARIL) 25 MG tablet Take 2 tablets (50 mg total) by mouth at bedtime as needed for itching. 06/17/13   Sunnie Nielsen, MD  ibuprofen (ADVIL,MOTRIN) 800 MG tablet Take 1 tablet (800 mg total) by mouth 3 (three) times daily. 04/08/14   Kathie Dike, PA-C  lithium carbonate 300 MG capsule Take 300-600 mg by mouth 2 (two) times daily. Take one capsule (300mg ) every day in the morning, and take two capsules (600mg ) every day at bedtime    Historical Provider, MD  propranolol (INNOPRAN XL) 80 MG 24 hr capsule Take 80 mg by mouth daily.     Historical Provider, MD  QUEtiapine (SEROQUEL) 300 MG tablet Take 300 mg by mouth every evening.     Historical Provider, MD  ranitidine (ZANTAC) 150 MG capsule Take 1 capsule (150 mg total) by mouth daily. 06/13/13   Benny Lennert, MD   BP 127/59  Pulse 70  Temp(Src) 98.9 F (37.2 C) (Oral)  Resp 20  Ht 5\' 8"  (1.727 m)  Wt 133 lb (60.328 kg)  BMI 20.23 kg/m2  SpO2 100% Physical Exam  Nursing note and vitals reviewed. Constitutional: He is oriented to person, place,  and time. He appears well-developed and well-nourished. No distress.  HENT:  Head: Normocephalic and atraumatic.  Right Ear: External ear normal.  Left Ear: External ear normal.  Nose: Nose normal.  Eyes: Right eye exhibits no discharge. Left eye exhibits no discharge.  Neck: Neck supple.  Cardiovascular: Normal rate.   Pulmonary/Chest: Effort normal.  Abdominal: Soft. He exhibits no distension. There is no tenderness. There is no rebound.  No abdominal ecchymosis  Musculoskeletal: He exhibits no edema.  Neurological: He is alert and oriented to person, place, and time.  Skin: Skin is warm and dry.    ED Course  Procedures (including critical care time) Labs Review Labs Reviewed - No data to display  Imaging Review No  results found.   EKG Interpretation None      MDM   Final diagnoses:  Injury of abdominal wall, initial encounter    Patient has a benign abdomen at this time. There is no ecchymosis or sign of any trauma such as abrasions or lacerations. Patient is frequently in the ED with falls and other traumatic injuries. At this point, with no objective signs of injury do not feel patient needs any imaging given his benign exam. No indication for narcotics. Offered ibuprofen which he states he can take at home. Will give return precautions.    Audree CamelScott T Tearia Gibbs, MD 05/22/14 2026  Patient's mom called requesting Xrays for her son. She endorses an injury yesterday and earlier this AM while lifting heavy objects. One caused mouth bleeding from a laceration. No evidence of laceration on re-exam, and patient denies this. She states he's vomited which he also denies. She feels his abdomen is distended, which is not seen on my exam as he is skinny and has a thin abdomen which looks well. I discussed no indication for xrays at this time and he has no signs of trauma. Discussed return precautions.  Audree CamelScott T Veneda Kirksey, MD 05/22/14 2037

## 2014-05-22 NOTE — Discharge Instructions (Signed)
Blunt Abdominal Trauma °A blunt injury to the abdomen can cause pain. The pain is most likely from bruising and stretching of your muscles. This pain is often made worse with movement. Most often these injuries are not serious and get better within 1 week with rest and mild pain medicine. However, internal organs (liver, spleen, kidneys) can be injured with blunt trauma. If you do not get better or if you get worse, further examination may be needed. °Continue with your regular daily activities, but avoid any strenuous activities until your pain is improved. If your stomach is upset, stick to a clear liquid diet and slowly advance to solid food.  °SEEK IMMEDIATE MEDICAL CARE IF:  °· You develop increasing pain, nausea, or repeated vomiting. °· You develop chest pain or breathing difficulty. °· You develop blood in the urine, vomit, or stool. °· You develop weakness, fainting, fever, or other serious complaints. °Document Released: 11/12/2004 Document Revised: 12/28/2011 Document Reviewed: 02/28/2009 °ExitCare® Patient Information ©2015 [employer]. This information is not intended to replace advice given to you by your health care provider. Make sure you discuss any questions you have with your health care provider. ° °

## 2014-05-30 ENCOUNTER — Emergency Department (HOSPITAL_COMMUNITY)
Admission: EM | Admit: 2014-05-30 | Discharge: 2014-05-30 | Disposition: A | Discharge status: Home / Self Care | Payer: Medicaid Other | Attending: Emergency Medicine | Admitting: Emergency Medicine

## 2014-05-30 ENCOUNTER — Encounter (HOSPITAL_COMMUNITY): Payer: Self-pay | Admitting: Emergency Medicine

## 2014-05-30 DIAGNOSIS — Z791 Long term (current) use of non-steroidal anti-inflammatories (NSAID): Secondary | ICD-10-CM | POA: Insufficient documentation

## 2014-05-30 DIAGNOSIS — Z880 Allergy status to penicillin: Secondary | ICD-10-CM | POA: Insufficient documentation

## 2014-05-30 DIAGNOSIS — T63461A Toxic effect of venom of wasps, accidental (unintentional), initial encounter: Secondary | ICD-10-CM | POA: Insufficient documentation

## 2014-05-30 DIAGNOSIS — T63441A Toxic effect of venom of bees, accidental (unintentional), initial encounter: Secondary | ICD-10-CM

## 2014-05-30 DIAGNOSIS — Z79899 Other long term (current) drug therapy: Secondary | ICD-10-CM | POA: Diagnosis not present

## 2014-05-30 DIAGNOSIS — Y939 Activity, unspecified: Secondary | ICD-10-CM | POA: Insufficient documentation

## 2014-05-30 DIAGNOSIS — IMO0002 Reserved for concepts with insufficient information to code with codable children: Secondary | ICD-10-CM | POA: Insufficient documentation

## 2014-05-30 DIAGNOSIS — Y929 Unspecified place or not applicable: Secondary | ICD-10-CM | POA: Insufficient documentation

## 2014-05-30 DIAGNOSIS — J45901 Unspecified asthma with (acute) exacerbation: Secondary | ICD-10-CM | POA: Diagnosis not present

## 2014-05-30 DIAGNOSIS — F909 Attention-deficit hyperactivity disorder, unspecified type: Secondary | ICD-10-CM | POA: Diagnosis not present

## 2014-05-30 DIAGNOSIS — T6391XA Toxic effect of contact with unspecified venomous animal, accidental (unintentional), initial encounter: Secondary | ICD-10-CM | POA: Diagnosis not present

## 2014-05-30 MED ORDER — PREDNISONE 20 MG PO TABS: 40.0000 mg | ORAL_TABLET | Freq: Every day | ORAL | Status: AC

## 2014-05-30 MED ORDER — PREDNISONE 10 MG PO TABS
ORAL_TABLET | ORAL | Status: AC
  Filled 2014-05-30: qty 1

## 2014-05-30 MED ORDER — FAMOTIDINE 20 MG PO TABS
20.0000 mg | ORAL_TABLET | Freq: Once | ORAL | Status: AC
  Administered 2014-05-30: 20 mg via ORAL
  Filled 2014-05-30: qty 1

## 2014-05-30 MED ORDER — DIPHENHYDRAMINE HCL 25 MG PO CAPS: 25.0000 mg | ORAL_CAPSULE | Freq: Once | ORAL | Status: DC

## 2014-05-30 MED ORDER — PREDNISONE 50 MG PO TABS
60.0000 mg | ORAL_TABLET | Freq: Once | ORAL | Status: AC
  Administered 2014-05-30: 60 mg via ORAL
  Filled 2014-05-30 (×2): qty 1

## 2014-05-30 NOTE — ED Notes (Signed)
Patient received 25 mg of beandryl IM in left deltoid by EMS PTA. Dr. Rubin PayorPickering notified.

## 2014-05-30 NOTE — Discharge Instructions (Signed)

## 2014-05-30 NOTE — ED Provider Notes (Signed)
CSN: [id number]     Arrival date & time 05/30/14  1912 History   First MD Initiated Contact with Patient 05/30/14 1918     Chief Complaint  Patient presents with  . Insect Bite     (Consider location/radiation/quality/duration/timing/severity/associated sxs/prior Treatment) The history is provided by the patient.   patient states he was stung on the left side of his head and his right hand by a bee around 20 minutes prior to arrival he states he has had allergic reactions the past. He states now he feels as if his throat was closing and is having trouble breathing. No nausea vomiting. No lightheadedness or dizziness. He's had an EpiPen in the past, but does not use it now. He is unsure of the reactions that he's had in the past.  Past Medical History  Diagnosis Date  . ADHD (attention deficit hyperactivity disorder)   . Back pain   . Shoulder pain   . Asthma    Past Surgical History  Procedure Laterality Date  . Upper gi endoscopy     Family History  Problem Relation Age of Onset  . Stroke Father   . Seizures Mother   . Seizures Brother    History  Substance Use Topics  . Smoking status: Never Smoker   . Smokeless tobacco: Never Used  . Alcohol Use: No    Review of Systems  Constitutional: Negative for activity change and appetite change.  HENT: Positive for trouble swallowing. Negative for facial swelling and postnasal drip.   Eyes: Negative for pain.  Respiratory: Positive for shortness of breath. Negative for chest tightness.   Cardiovascular: Negative for chest pain and leg swelling.  Gastrointestinal: Negative for nausea, vomiting, abdominal pain and diarrhea.  Genitourinary: Negative for flank pain.  Musculoskeletal: Negative for back pain and neck stiffness.  Skin: Negative for rash.  Neurological: Negative for weakness, numbness and headaches.  Psychiatric/Behavioral: Negative for behavioral problems.      Allergies  Codeine; Lactose intolerance (gi);  Percocet; and Penicillins  Home Medications   Prior to Admission medications   Medication Sig Start Date End Date Taking? Authorizing Provider  alprazolam Prudy Feeler) 2 MG tablet Take 2 mg by mouth 4 (four) times daily as needed. For anxiety   Yes Historical Provider, MD  famotidine (PEPCID) 20 MG tablet Take 1 tablet (20 mg total) by mouth 2 (two) times daily. 03/06/14  Yes Hope Orlene Och, NP  fluticasone (FLONASE) 50 MCG/ACT nasal spray Place 2 sprays into the nose daily.     Yes Historical Provider, MD  hydrOXYzine (ATARAX/VISTARIL) 25 MG tablet Take 2 tablets (50 mg total) by mouth at bedtime as needed for itching. 06/17/13  Yes Sunnie Nielsen, MD  ibuprofen (ADVIL,MOTRIN) 800 MG tablet Take 1 tablet (800 mg total) by mouth 3 (three) times daily. 04/08/14  Yes Kathie Dike, PA-C  lithium carbonate 300 MG capsule Take 300-600 mg by mouth 2 (two) times daily. Take one capsule (300mg ) every day in the morning, and take two capsules (600mg ) every day at bedtime   Yes Historical Provider, MD  propranolol (INNOPRAN XL) 80 MG 24 hr capsule Take 80 mg by mouth daily.    Yes Historical Provider, MD  QUEtiapine (SEROQUEL) 300 MG tablet Take 300 mg by mouth every evening.    Yes Historical Provider, MD  ranitidine (ZANTAC) 150 MG capsule Take 1 capsule (150 mg total) by mouth daily. 06/13/13  Yes Benny Lennert, MD  EPINEPHrine (EPI-PEN) 0.3 mg/0.3 mL SOAJ injection  Inject 0.3 mLs (0.3 mg total) into the muscle once. 07/26/13   Donnetta HutchingBrian Cook, MD  predniSONE (DELTASONE) 20 MG tablet Take 2 tablets (40 mg total) by mouth daily. 05/31/14 06/01/14  Juliet RudeNathan R. Chrishawn Boley, MD   BP 101/72  Pulse 62  Temp(Src) 98.2 F (36.8 C) (Oral)  Resp 20  Ht 5\' 8"  (1.727 m)  Wt 135 lb (61.236 kg)  BMI 20.53 kg/m2  SpO2 100% Physical Exam  Nursing note and vitals reviewed. Constitutional: He is oriented to person, place, and time. He appears well-developed and well-nourished.  HENT:  Head: Normocephalic and atraumatic.   Mouth/Throat: Oropharynx is clear and moist. No oropharyngeal exudate.  No stridor. No posterior pharyngeal erythema or edema  Eyes: EOM are normal. Pupils are equal, round, and reactive to light.  Neck: Normal range of motion. Neck supple.  Cardiovascular: Normal rate, regular rhythm and normal heart sounds.   No murmur heard. Pulmonary/Chest: Effort normal and breath sounds normal. No respiratory distress. He has no wheezes. He has no rales.  Abdominal: Soft. Bowel sounds are normal. He exhibits no distension and no mass. There is no tenderness. There is no rebound and no guarding.  Musculoskeletal: Normal range of motion. He exhibits no edema.  Neurological: He is alert and oriented to person, place, and time. No cranial nerve deficit.  Skin: Skin is warm and dry.  Erythema to the dorsum of right first metacarpal area on hand. Some mild swelling. No visible erythema to the left side of head in the area where he states he was stung  Psychiatric: He has a normal mood and affect.    ED Course  Procedures (including critical care time) Labs Review Labs Reviewed - No data to display  Imaging Review No results found.   EKG Interpretation None      MDM   Final diagnoses:  Bee sting reaction, accidental or unintentional, initial encounter    Patient with bee sting. States trouble breathing, but in no apparent distress. Patient monitored and remained stable. Will discharge home today course of steroids. Does not appear to be a severe allergic reaction at this time    Juliet Rudeathan R. Rubin PayorPickering, MD 05/30/14 2211

## 2014-05-30 NOTE — ED Notes (Signed)
Pt got stung by bees x 2. Pt states his throat feels like it is closing up.

## 2014-06-04 ENCOUNTER — Encounter (HOSPITAL_COMMUNITY): Payer: Self-pay | Admitting: Emergency Medicine

## 2014-06-04 ENCOUNTER — Emergency Department (HOSPITAL_COMMUNITY)
Admission: EM | Admit: 2014-06-04 | Discharge: 2014-06-05 | Disposition: A | Discharge status: Home / Self Care | Payer: Medicaid Other | Attending: Emergency Medicine | Admitting: Emergency Medicine

## 2014-06-04 DIAGNOSIS — Z79899 Other long term (current) drug therapy: Secondary | ICD-10-CM | POA: Diagnosis not present

## 2014-06-04 DIAGNOSIS — K089 Disorder of teeth and supporting structures, unspecified: Secondary | ICD-10-CM | POA: Insufficient documentation

## 2014-06-04 DIAGNOSIS — K047 Periapical abscess without sinus: Secondary | ICD-10-CM | POA: Diagnosis not present

## 2014-06-04 DIAGNOSIS — K006 Disturbances in tooth eruption: Secondary | ICD-10-CM | POA: Diagnosis not present

## 2014-06-04 DIAGNOSIS — IMO0002 Reserved for concepts with insufficient information to code with codable children: Secondary | ICD-10-CM | POA: Diagnosis not present

## 2014-06-04 DIAGNOSIS — Z791 Long term (current) use of non-steroidal anti-inflammatories (NSAID): Secondary | ICD-10-CM | POA: Insufficient documentation

## 2014-06-04 DIAGNOSIS — J45909 Unspecified asthma, uncomplicated: Secondary | ICD-10-CM | POA: Insufficient documentation

## 2014-06-04 DIAGNOSIS — F909 Attention-deficit hyperactivity disorder, unspecified type: Secondary | ICD-10-CM | POA: Insufficient documentation

## 2014-06-04 DIAGNOSIS — K044 Acute apical periodontitis of pulpal origin: Secondary | ICD-10-CM | POA: Diagnosis not present

## 2014-06-04 DIAGNOSIS — K029 Dental caries, unspecified: Secondary | ICD-10-CM | POA: Diagnosis not present

## 2014-06-04 DIAGNOSIS — Z880 Allergy status to penicillin: Secondary | ICD-10-CM | POA: Insufficient documentation

## 2014-06-04 NOTE — ED Notes (Signed)
Having dental pain to right upper jaw.

## 2014-06-05 MED ORDER — CLINDAMYCIN HCL 150 MG PO CAPS: 150.0000 mg | ORAL_CAPSULE | Freq: Four times a day (QID) | ORAL | Status: DC

## 2014-06-05 MED ORDER — CLINDAMYCIN HCL 150 MG PO CAPS
150.0000 mg | ORAL_CAPSULE | Freq: Once | ORAL | Status: AC
  Administered 2014-06-05: 150 mg via ORAL
  Filled 2014-06-05: qty 1

## 2014-06-05 NOTE — ED Provider Notes (Signed)
CSN: [id number]     Arrival date & time 06/04/14  2132 History   First MD Initiated Contact with Patient 06/04/14 2349     Chief Complaint  Patient presents with  . Dental Pain     (Consider location/radiation/quality/duration/timing/severity/associated sxs/prior Treatment) HPI  Eugene Hicks is a 23 y.o. male presenting with complaints of dental pain.  He describes pain and swelling present for the past 2 days along his right lower teeth.  He has chronic poor dentition and reports he has an appointment with an oral surgeon in 2 days (does not recall name)  in anticipation of having full dental extractions.  He has taken no medicines prior to arrival for his pain.  Past Medical History  Diagnosis Date  . ADHD (attention deficit hyperactivity disorder)   . Back pain   . Shoulder pain   . Asthma    Past Surgical History  Procedure Laterality Date  . Upper gi endoscopy     Family History  Problem Relation Age of Onset  . Stroke Father   . Seizures Mother   . Seizures Brother    History  Substance Use Topics  . Smoking status: Never Smoker   . Smokeless tobacco: Never Used  . Alcohol Use: No    Review of Systems  Constitutional: Negative for fever.  HENT: Positive for dental problem. Negative for facial swelling and sore throat.   Respiratory: Negative for shortness of breath.   Musculoskeletal: Negative for neck pain and neck stiffness.      Allergies  Codeine; Lactose intolerance (gi); Percocet; and Penicillins  Home Medications   Prior to Admission medications   Medication Sig Start Date End Date Taking? Authorizing Provider  alprazolam Prudy Feeler) 2 MG tablet Take 2 mg by mouth 4 (four) times daily as needed. For anxiety    Historical Provider, MD  clindamycin (CLEOCIN) 150 MG capsule Take 1 capsule (150 mg total) by mouth every 6 (six) hours. 06/05/14   Burgess Amor, PA-C  EPINEPHrine (EPI-PEN) 0.3 mg/0.3 mL SOAJ injection Inject 0.3 mLs (0.3 mg total) into the  muscle once. 07/26/13   Donnetta Hutching, MD  famotidine (PEPCID) 20 MG tablet Take 1 tablet (20 mg total) by mouth 2 (two) times daily. 03/06/14   Hope Orlene Och, NP  fluticasone (FLONASE) 50 MCG/ACT nasal spray Place 2 sprays into the nose daily.      Historical Provider, MD  hydrOXYzine (ATARAX/VISTARIL) 25 MG tablet Take 2 tablets (50 mg total) by mouth at bedtime as needed for itching. 06/17/13   Sunnie Nielsen, MD  ibuprofen (ADVIL,MOTRIN) 800 MG tablet Take 1 tablet (800 mg total) by mouth 3 (three) times daily. 04/08/14   Kathie Dike, PA-C  lithium carbonate 300 MG capsule Take 300-600 mg by mouth 2 (two) times daily. Take one capsule (300mg ) every day in the morning, and take two capsules (600mg ) every day at bedtime    Historical Provider, MD  propranolol (INNOPRAN XL) 80 MG 24 hr capsule Take 80 mg by mouth daily.     Historical Provider, MD  QUEtiapine (SEROQUEL) 300 MG tablet Take 300 mg by mouth every evening.     Historical Provider, MD  ranitidine (ZANTAC) 150 MG capsule Take 1 capsule (150 mg total) by mouth daily. 06/13/13   Benny Lennert, MD   BP 112/62  Pulse 69  Temp(Src) 98.8 F (37.1 C) (Oral)  Resp 24  Ht 5\' 8"  (1.727 m)  Wt 135 lb (61.236 kg)  BMI 20.53  kg/m2  SpO2 100% Physical Exam  Constitutional: He is oriented to person, place, and time. He appears well-developed and well-nourished. No distress.  HENT:  Head: Normocephalic and atraumatic.  Right Ear: Tympanic membrane and external ear normal.  Left Ear: Tympanic membrane and external ear normal.  Mouth/Throat: Oropharynx is clear and moist and mucous membranes are normal. No oral lesions. Dental abscesses present.  Poor dentition with all teeth fractured to gingival line.  Edema and erythema along right lateral lower gingiva.  No fluctuance and no facial edema or erythema.  Subungual space is soft.  No trismus.    Eyes: Conjunctivae are normal.  Neck: Normal range of motion. Neck supple.  Cardiovascular: Normal rate  and normal heart sounds.   Pulmonary/Chest: Effort normal.  Abdominal: He exhibits no distension.  Musculoskeletal: Normal range of motion.  Lymphadenopathy:    He has no cervical adenopathy.  Neurological: He is alert and oriented to person, place, and time.  Skin: Skin is warm and dry. No erythema.  Psychiatric: He has a normal mood and affect.    ED Course  Procedures (including critical care time) Labs Review Labs Reviewed - No data to display  Imaging Review No results found.   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  Clindamycin.  Ibuprofen prn pain.  F/u with oral surgeon as planned.    Burgess AmorJulie Roshelle Traub, PA-C 06/05/14 [phone]

## 2014-06-05 NOTE — ED Provider Notes (Signed)
Medical screening examination/treatment/procedure(s) were performed by non-physician practitioner and as supervising physician I was immediately available for consultation/collaboration.   EKG Interpretation None        Andri Prestia L Modell Fendrick, MD 06/05/14 0736 

## 2014-06-05 NOTE — Discharge Instructions (Signed)
Abscessed Tooth An abscessed tooth is an infection around your tooth. It may be caused by holes or damage to the tooth (cavity) or a dental disease. An abscessed tooth causes mild to very bad pain in and around the tooth. See your dentist right away if you have tooth or gum pain. HOME CARE  Take your medicine as told. Finish it even if you start to feel better.  Do not drive after taking pain medicine.  Rinse your mouth (gargle) often with salt water ( teaspoon salt in 8 ounces of warm water).  Do not apply heat to the outside of your face. GET HELP RIGHT AWAY IF:   You have a temperature by mouth above 102 F (38.9 C), not controlled by medicine.  You have chills and a very bad headache.  You have problems breathing or swallowing.  Your mouth will not open.  You develop puffiness (swelling) on the neck or around the eye.  Your pain is not helped by medicine.  Your pain is getting worse instead of better. MAKE SURE YOU:   Understand these instructions.  Will watch your condition.  Will get help right away if you are not doing well or get worse. Document Released: 03/23/2008 Document Revised: 12/28/2011 Document Reviewed: 01/13/2011 Exit[employer] Patient Information 2015 [employer]. This information is not intended to replace advice given to you by your health care provider. Make sure you discuss any questions you have with your health care provider.  

## 2014-06-08 ENCOUNTER — Emergency Department (HOSPITAL_COMMUNITY)
Admission: EM | Admit: 2014-06-08 | Discharge: 2014-06-08 | Disposition: A | Discharge status: Home / Self Care | Payer: Medicaid Other | Attending: Emergency Medicine | Admitting: Emergency Medicine

## 2014-06-08 ENCOUNTER — Emergency Department (HOSPITAL_COMMUNITY): Payer: Medicaid Other

## 2014-06-08 ENCOUNTER — Encounter (HOSPITAL_COMMUNITY): Payer: Self-pay | Admitting: Emergency Medicine

## 2014-06-08 DIAGNOSIS — Z880 Allergy status to penicillin: Secondary | ICD-10-CM | POA: Insufficient documentation

## 2014-06-08 DIAGNOSIS — Z792 Long term (current) use of antibiotics: Secondary | ICD-10-CM | POA: Diagnosis not present

## 2014-06-08 DIAGNOSIS — R404 Transient alteration of awareness: Secondary | ICD-10-CM | POA: Insufficient documentation

## 2014-06-08 DIAGNOSIS — F909 Attention-deficit hyperactivity disorder, unspecified type: Secondary | ICD-10-CM | POA: Insufficient documentation

## 2014-06-08 DIAGNOSIS — Z79899 Other long term (current) drug therapy: Secondary | ICD-10-CM | POA: Insufficient documentation

## 2014-06-08 DIAGNOSIS — R55 Syncope and collapse: Secondary | ICD-10-CM | POA: Insufficient documentation

## 2014-06-08 DIAGNOSIS — J45909 Unspecified asthma, uncomplicated: Secondary | ICD-10-CM | POA: Insufficient documentation

## 2014-06-08 DIAGNOSIS — IMO0002 Reserved for concepts with insufficient information to code with codable children: Secondary | ICD-10-CM | POA: Diagnosis not present

## 2014-06-08 LAB — CBC WITH DIFFERENTIAL/PLATELET
BASOS PCT: 1 % (ref 0–1)
Basophils Absolute: 0 10*3/uL (ref 0.0–0.1)
EOS PCT: 6 % — AB (ref 0–5)
Eosinophils Absolute: 0.5 10*3/uL (ref 0.0–0.7)
HCT: 32.1 % — ABNORMAL LOW (ref 39.0–52.0)
Hemoglobin: 10.2 g/dL — ABNORMAL LOW (ref 13.0–17.0)
LYMPHS ABS: 1.5 10*3/uL (ref 0.7–4.0)
Lymphocytes Relative: 17 % (ref 12–46)
MCH: 20.1 pg — ABNORMAL LOW (ref 26.0–34.0)
MCHC: 31.8 g/dL (ref 30.0–36.0)
MCV: 63.2 fL — AB (ref 78.0–100.0)
Monocytes Absolute: 0.4 10*3/uL (ref 0.1–1.0)
Monocytes Relative: 5 % (ref 3–12)
Neutro Abs: 6.1 10*3/uL (ref 1.7–7.7)
Neutrophils Relative %: 72 % (ref 43–77)
Platelets: 212 10*3/uL (ref 150–400)
RBC: 5.08 MIL/uL (ref 4.22–5.81)
RDW: 15.2 % (ref 11.5–15.5)
WBC: 8.6 10*3/uL (ref 4.0–10.5)

## 2014-06-08 LAB — COMPREHENSIVE METABOLIC PANEL
ALT: 16 U/L (ref 0–53)
AST: 19 U/L (ref 0–37)
Albumin: 4.1 g/dL (ref 3.5–5.2)
Alkaline Phosphatase: 76 U/L (ref 39–117)
Anion gap: 8 (ref 5–15)
BILIRUBIN TOTAL: 0.8 mg/dL (ref 0.3–1.2)
BUN: 9 mg/dL (ref 6–23)
CALCIUM: 9.2 mg/dL (ref 8.4–10.5)
CHLORIDE: 107 meq/L (ref 96–112)
CO2: 27 meq/L (ref 19–32)
CREATININE: 0.81 mg/dL (ref 0.50–1.35)
GFR calc Af Amer: >90 mL/min (ref >90)
Glucose, Bld: 90 mg/dL (ref 70–99)
Potassium: 3.9 mEq/L (ref 3.7–5.3)
Sodium: 142 mEq/L (ref 137–147)
Total Protein: 6.8 g/dL (ref 6.0–8.3)

## 2014-06-08 LAB — TROPONIN I

## 2014-06-08 MED ORDER — SODIUM CHLORIDE 0.9 % IV BOLUS (SEPSIS)
1000.0000 mL | Freq: Once | INTRAVENOUS | Status: AC
  Administered 2014-06-08: 1000 mL via INTRAVENOUS

## 2014-06-08 NOTE — Discharge Instructions (Signed)
Follow up with your md next week for recheck °

## 2014-06-08 NOTE — ED Notes (Signed)
Reports was weed-eating today when "blacked out, hitting ground and was out for 20-30 minutes."  States has been feeling dehydrated today.  Reports syncopal episode was unwitnessed. Denies pain.

## 2014-06-08 NOTE — ED Provider Notes (Signed)
CSN: [id number]     Arrival date & time 06/08/14  1250 History   First MD Initiated Contact with Patient 06/08/14 1330     Chief Complaint  Patient presents with  . Loss of Consciousness     (Consider location/radiation/quality/duration/timing/severity/associated sxs/prior Treatment) Patient is a 23 y.o. male presenting with syncope. The history is provided by the patient (the pt states he was out side weed eating and became dizzy and passed out).  Loss of Consciousness Episode history:  Single Timing:  Constant Progression:  Waxing and waning Chronicity:  New Context: not blood draw   Witnessed: no   Relieved by:  Nothing Worsened by:  Nothing tried Associated symptoms: no chest pain, no headaches and no seizures     Past Medical History  Diagnosis Date  . ADHD (attention deficit hyperactivity disorder)   . Back pain   . Shoulder pain   . Asthma    Past Surgical History  Procedure Laterality Date  . Upper gi endoscopy     Family History  Problem Relation Age of Onset  . Stroke Father   . Seizures Mother   . Seizures Brother    History  Substance Use Topics  . Smoking status: Never Smoker   . Smokeless tobacco: Never Used  . Alcohol Use: No    Review of Systems  Constitutional: Negative for appetite change and fatigue.  HENT: Negative for congestion, ear discharge and sinus pressure.   Eyes: Negative for discharge.  Respiratory: Negative for cough.   Cardiovascular: Positive for syncope. Negative for chest pain.  Gastrointestinal: Negative for abdominal pain and diarrhea.  Genitourinary: Negative for frequency and hematuria.  Musculoskeletal: Negative for back pain.  Skin: Negative for rash.  Neurological: Negative for seizures and headaches.  Psychiatric/Behavioral: Negative for hallucinations.      Allergies  Codeine; Lactose intolerance (gi); Percocet; and Penicillins  Home Medications   Prior to Admission medications   Medication Sig Start Date  End Date Taking? Authorizing Provider  alprazolam Prudy Feeler) 2 MG tablet Take 2 mg by mouth 4 (four) times daily as needed. For anxiety   Yes Historical Provider, MD  clindamycin (CLEOCIN) 150 MG capsule Take 1 capsule (150 mg total) by mouth every 6 (six) hours. 06/05/14  Yes Burgess Amor, PA-C  EPINEPHrine (EPI-PEN) 0.3 mg/0.3 mL SOAJ injection Inject 0.3 mLs (0.3 mg total) into the muscle once. 07/26/13  Yes Donnetta Hutching, MD  famotidine (PEPCID) 20 MG tablet Take 1 tablet (20 mg total) by mouth 2 (two) times daily. 03/06/14  Yes Hope Orlene Och, NP  fluticasone (FLONASE) 50 MCG/ACT nasal spray Place 2 sprays into the nose daily.     Yes Historical Provider, MD  hydrOXYzine (ATARAX/VISTARIL) 25 MG tablet Take 2 tablets (50 mg total) by mouth at bedtime as needed for itching. 06/17/13  Yes Sunnie Nielsen, MD  lithium carbonate 300 MG capsule Take 300-600 mg by mouth 2 (two) times daily. Take one capsule (300mg ) every day in the morning, and take two capsules (600mg ) every day at bedtime   Yes Historical Provider, MD  propranolol (INNOPRAN XL) 80 MG 24 hr capsule Take 80 mg by mouth daily.    Yes Historical Provider, MD  QUEtiapine (SEROQUEL) 300 MG tablet Take 300 mg by mouth every evening.    Yes Historical Provider, MD  ranitidine (ZANTAC) 150 MG capsule Take 1 capsule (150 mg total) by mouth daily. 06/13/13  Yes Benny Lennert, MD   BP 105/54  Pulse 79  Temp(Src)  98.2 F (36.8 C) (Oral)  Resp 17  Ht 5\' 8"  (1.727 m)  Wt 135 lb (61.236 kg)  BMI 20.53 kg/m2  SpO2 100% Physical Exam  Constitutional: He is oriented to person, place, and time. He appears well-developed.  HENT:  Head: Normocephalic.  Eyes: Conjunctivae and EOM are normal. No scleral icterus.  Neck: Neck supple. No thyromegaly present.  Cardiovascular: Normal rate and regular rhythm.  Exam reveals no gallop and no friction rub.   No murmur heard. Pulmonary/Chest: No stridor. He has no wheezes. He has no rales. He exhibits no tenderness.   Abdominal: He exhibits no distension. There is no tenderness. There is no rebound.  Musculoskeletal: Normal range of motion. He exhibits no edema.  Lymphadenopathy:    He has no cervical adenopathy.  Neurological: He is oriented to person, place, and time. He exhibits normal muscle tone. Coordination normal.  Skin: No rash noted. No erythema.  Psychiatric: He has a normal mood and affect. His behavior is normal.    ED Course  Procedures (including critical care time) Labs Review Labs Reviewed  CBC WITH DIFFERENTIAL - Abnormal; Notable for the following:    Hemoglobin 10.2 (*)    HCT 32.1 (*)    MCV 63.2 (*)    MCH 20.1 (*)    Eosinophils Relative 6 (*)    All other components within normal limits  COMPREHENSIVE METABOLIC PANEL  TROPONIN I    Imaging Review Dg Chest 2 View  06/08/2014   CLINICAL DATA:  Loss of consciousness, weakness  EXAM: CHEST  2 VIEW  COMPARISON:  10/31/2013  FINDINGS: Cardiomediastinal silhouette is stable. No acute infiltrate or pleural effusion. No pulmonary edema. Bony thorax is unremarkable.  IMPRESSION: No active cardiopulmonary disease.   Electronically Signed   By: Natasha MeadLiviu  Pop M.D.   On: 06/08/2014 13:54   Ct Head Wo Contrast  06/08/2014   CLINICAL DATA:  Passed out today hitting nose and forehead  EXAM: CT HEAD WITHOUT CONTRAST  TECHNIQUE: Contiguous axial images were obtained from the base of the skull through the vertex without intravenous contrast.  COMPARISON:  Head CT 09/19/2013  FINDINGS: No acute intracranial hemorrhage. No focal mass lesion. No CT evidence of acute infarction. No midline shift or mass effect. No hydrocephalus. Basilar cisterns are patent.  No evidence of nasal bone fracture or frontal bone fracture. There is opacification of the frontal sinuses which is slightly progressed from comparison exam. There is near complete opacification ethmoid air cells which is similar to prior. No evidence of skullbase fracture or calvarial fracture   IMPRESSION: 1. No acute intracranial trauma. 2. No evidence of skull fracture. 3. Chronic sinus inflammation involving the frontal sinuses and ethmoid air cells.   Electronically Signed   By: Genevive BiStewart  Edmunds M.D.   On: 06/08/2014 14:24     EKG Interpretation None      MDM   Final diagnoses:  Vasovagal syncope        Benny LennertJoseph L Jahi Roza, MD 06/08/14 [phone]

## 2014-06-10 ENCOUNTER — Emergency Department (HOSPITAL_COMMUNITY)
Admission: EM | Admit: 2014-06-10 | Discharge: 2014-06-10 | Disposition: A | Discharge status: Home / Self Care | Payer: Medicaid Other | Attending: Emergency Medicine | Admitting: Emergency Medicine

## 2014-06-10 ENCOUNTER — Encounter (HOSPITAL_COMMUNITY): Payer: Self-pay | Admitting: Emergency Medicine

## 2014-06-10 DIAGNOSIS — T63461A Toxic effect of venom of wasps, accidental (unintentional), initial encounter: Secondary | ICD-10-CM | POA: Diagnosis not present

## 2014-06-10 DIAGNOSIS — T6391XA Toxic effect of contact with unspecified venomous animal, accidental (unintentional), initial encounter: Secondary | ICD-10-CM | POA: Diagnosis present

## 2014-06-10 DIAGNOSIS — Z8659 Personal history of other mental and behavioral disorders: Secondary | ICD-10-CM | POA: Diagnosis not present

## 2014-06-10 DIAGNOSIS — Y9289 Other specified places as the place of occurrence of the external cause: Secondary | ICD-10-CM | POA: Diagnosis not present

## 2014-06-10 DIAGNOSIS — J45909 Unspecified asthma, uncomplicated: Secondary | ICD-10-CM | POA: Insufficient documentation

## 2014-06-10 DIAGNOSIS — Z880 Allergy status to penicillin: Secondary | ICD-10-CM | POA: Insufficient documentation

## 2014-06-10 DIAGNOSIS — Y9389 Activity, other specified: Secondary | ICD-10-CM | POA: Diagnosis not present

## 2014-06-10 DIAGNOSIS — IMO0002 Reserved for concepts with insufficient information to code with codable children: Secondary | ICD-10-CM | POA: Insufficient documentation

## 2014-06-10 DIAGNOSIS — T63481A Toxic effect of venom of other arthropod, accidental (unintentional), initial encounter: Secondary | ICD-10-CM

## 2014-06-10 DIAGNOSIS — Z792 Long term (current) use of antibiotics: Secondary | ICD-10-CM | POA: Diagnosis not present

## 2014-06-10 DIAGNOSIS — Z79899 Other long term (current) drug therapy: Secondary | ICD-10-CM | POA: Insufficient documentation

## 2014-06-10 NOTE — ED Provider Notes (Signed)
CSN: [id number]     Arrival date & time 06/10/14  2014 History   First MD Initiated Contact with Patient 06/10/14 2044     Chief Complaint  Patient presents with  . Insect Bite     (Consider location/radiation/quality/duration/timing/severity/associated sxs/prior Treatment) HPI Patient is a frequent ED visitor. He states he was cleaning up some trash and he ran up a hill and fell and states he got stung by something possibly on his back. He states he came to the emergency room immediately afterwards. He states however "I don't remember where I got stung". Patient states he gets short of breath when he talks however he is in no respiratory distress. He is speaking in full sentences. Patient also states he is itching however he has not been seen to be scratching and has no excoriations. Patient was in the concession area and when his name was called he walked rapidly back to his room in no distress.  PCP Dr Glenis Smoker  Past Medical History  Diagnosis Date  . ADHD (attention deficit hyperactivity disorder)   . Back pain   . Shoulder pain   . Asthma    Past Surgical History  Procedure Laterality Date  . Upper gi endoscopy     Family History  Problem Relation Age of Onset  . Stroke Father   . Seizures Mother   . Seizures Brother    History  Substance Use Topics  . Smoking status: Never Smoker   . Smokeless tobacco: Never Used  . Alcohol Use: No   lives at home Lives with parents  Review of Systems  All other systems reviewed and are negative.     Allergies  Codeine; Lactose intolerance (gi); Percocet; and Penicillins  Home Medications   Prior to Admission medications   Medication Sig Start Date End Date Taking? Authorizing Provider  alprazolam Prudy Feeler) 2 MG tablet Take 2 mg by mouth 4 (four) times daily as needed. For anxiety    Historical Provider, MD  clindamycin (CLEOCIN) 150 MG capsule Take 1 capsule (150 mg total) by mouth every 6 (six) hours. 06/05/14   Burgess Amor,  PA-C  EPINEPHrine (EPI-PEN) 0.3 mg/0.3 mL SOAJ injection Inject 0.3 mLs (0.3 mg total) into the muscle once. 07/26/13   Donnetta Hutching, MD  famotidine (PEPCID) 20 MG tablet Take 1 tablet (20 mg total) by mouth 2 (two) times daily. 03/06/14   Hope Orlene Och, NP  fluticasone (FLONASE) 50 MCG/ACT nasal spray Place 2 sprays into the nose daily.      Historical Provider, MD  hydrOXYzine (ATARAX/VISTARIL) 25 MG tablet Take 2 tablets (50 mg total) by mouth at bedtime as needed for itching. 06/17/13   Sunnie Nielsen, MD  lithium carbonate 300 MG capsule Take 300-600 mg by mouth 2 (two) times daily. Take one capsule ( ) every day in the morning, and take two capsules ( ) every day at bedtime    Historical Provider, MD  propranolol (INNOPRAN XL) 80 MG 24 hr capsule Take 80 mg by mouth daily.     Historical Provider, MD  QUEtiapine (SEROQUEL) 300 MG tablet Take 300 mg by mouth every evening.     Historical Provider, MD  ranitidine (ZANTAC) 150 MG capsule Take 1 capsule (150 mg total) by mouth daily. 06/13/13   Benny Lennert, MD   BP 110/68  Pulse 65  Temp(Src) 98.6 F (37 C) (Oral)  Resp 24  Ht  (1.727 m)  Wt 135 lb (61.236 kg)  BMI 20.53 kg/m2  SpO2 100%  Vital signs normal   Physical Exam  Nursing note and vitals reviewed. Constitutional: He is oriented to person, place, and time. He appears well-developed and well-nourished.  Non-toxic appearance. He does not appear ill. No distress.  HENT:  Head: Normocephalic and atraumatic.  Right Ear: External ear normal.  Left Ear: External ear normal.  Nose: Nose normal. No mucosal edema or rhinorrhea.  Mouth/Throat: Oropharynx is clear and moist and mucous membranes are normal. No dental abscesses or uvula swelling.  Eyes: Conjunctivae and EOM are normal. Pupils are equal, round, and reactive to light.  Neck: Normal range of motion and full passive range of motion without pain. Neck supple.  Cardiovascular: Normal rate, regular rhythm and normal  heart sounds.  Exam reveals no gallop and no friction rub.   No murmur heard. Pulmonary/Chest: Effort normal and breath sounds normal. No respiratory distress. He has no wheezes. He has no rhonchi. He has no rales. He exhibits no tenderness and no crepitus.  Abdominal: Soft. Normal appearance and bowel sounds are normal. He exhibits no distension. There is no tenderness. There is no rebound and no guarding.  Musculoskeletal: Normal range of motion. He exhibits no edema and no tenderness.  Moves all extremities well.   Neurological: He is alert and oriented to person, place, and time. He has normal strength. No cranial nerve deficit.  Skin: Skin is warm, dry and intact. No rash noted. No erythema. No pallor.  Patient has multiple areas of blackheads on his face and trunk. He has a few scattered small red acne type lesions on his back. There is no area consistent with a bug bite seen. Patient cannot show me where he was stung.  Psychiatric: He has a normal mood and affect. His speech is normal and behavior is normal. His mood appears not anxious.  Patient seems mentally slow    ED Course  Procedures (including critical care time) Labs Review Labs Reviewed - No data to display  Imaging Review No results found.   EKG Interpretation None      MDM   Final diagnoses:  Insect sting, accidental or unintentional, initial encounter    Plan discharge  Devoria Albe, MD, Franz Dell, MD 06/10/14 2124

## 2014-06-10 NOTE — ED Notes (Signed)
Pt called x1 with no answer.  

## 2014-06-10 NOTE — ED Notes (Signed)
Pt states he thinks he was stung by a bee x 1 hour ago; pt alert and no sob noted while in triage

## 2014-06-10 NOTE — Discharge Instructions (Signed)
Your skin looks fine, no obvious reaction to a sting. Recheck as needed.

## 2014-07-26 ENCOUNTER — Emergency Department (HOSPITAL_COMMUNITY): Payer: Medicaid Other

## 2014-07-26 ENCOUNTER — Emergency Department (HOSPITAL_COMMUNITY)
Admission: EM | Admit: 2014-07-26 | Discharge: 2014-07-26 | Disposition: A | Discharge status: Home / Self Care | Payer: Medicaid Other | Attending: Emergency Medicine | Admitting: Emergency Medicine

## 2014-07-26 ENCOUNTER — Encounter (HOSPITAL_COMMUNITY): Payer: Self-pay | Admitting: Emergency Medicine

## 2014-07-26 DIAGNOSIS — J45909 Unspecified asthma, uncomplicated: Secondary | ICD-10-CM | POA: Diagnosis not present

## 2014-07-26 DIAGNOSIS — Z7951 Long term (current) use of inhaled steroids: Secondary | ICD-10-CM | POA: Insufficient documentation

## 2014-07-26 DIAGNOSIS — Z79899 Other long term (current) drug therapy: Secondary | ICD-10-CM | POA: Diagnosis not present

## 2014-07-26 DIAGNOSIS — Z880 Allergy status to penicillin: Secondary | ICD-10-CM | POA: Insufficient documentation

## 2014-07-26 DIAGNOSIS — R079 Chest pain, unspecified: Secondary | ICD-10-CM | POA: Diagnosis not present

## 2014-07-26 DIAGNOSIS — Z8659 Personal history of other mental and behavioral disorders: Secondary | ICD-10-CM | POA: Diagnosis not present

## 2014-07-26 LAB — BASIC METABOLIC PANEL
ANION GAP: 11 (ref 5–15)
BUN: 11 mg/dL (ref 6–23)
CO2: 27 meq/L (ref 19–32)
Calcium: 10.1 mg/dL (ref 8.4–10.5)
Chloride: 101 mEq/L (ref 96–112)
Creatinine, Ser: 0.87 mg/dL (ref 0.50–1.35)
GFR calc Af Amer: >90 mL/min (ref >90)
GFR calc non Af Amer: >90 mL/min (ref >90)
GLUCOSE: 100 mg/dL — AB (ref 70–99)
Potassium: 4.2 mEq/L (ref 3.7–5.3)
SODIUM: 139 meq/L (ref 137–147)

## 2014-07-26 LAB — CBC
HCT: 34.4 % — ABNORMAL LOW (ref 39.0–52.0)
HEMOGLOBIN: 11.2 g/dL — AB (ref 13.0–17.0)
MCH: 20.8 pg — ABNORMAL LOW (ref 26.0–34.0)
MCHC: 32.6 g/dL (ref 30.0–36.0)
MCV: 63.8 fL — ABNORMAL LOW (ref 78.0–100.0)
PLATELETS: 198 10*3/uL (ref 150–400)
RBC: 5.39 MIL/uL (ref 4.22–5.81)
RDW: 15.1 % (ref 11.5–15.5)
WBC: 10.1 10*3/uL (ref 4.0–10.5)

## 2014-07-26 LAB — TROPONIN I

## 2014-07-26 MED ORDER — ALPRAZOLAM 1 MG PO TABS: 1.0000 mg | ORAL_TABLET | Freq: Two times a day (BID) | ORAL | Status: DC | PRN

## 2014-07-26 NOTE — ED Provider Notes (Signed)
CSN: [id number]     Arrival date & time 07/26/14  1805 History   First MD Initiated Contact with Patient 07/26/14 1835     Chief Complaint  Patient presents with  . Chest Pain     (Consider location/radiation/quality/duration/timing/severity/associated sxs/prior Treatment) Patient is a 23 y.o. male presenting with chest pain. The history is provided by the patient and a relative.  Chest Pain Associated symptoms: no abdominal pain, no back pain, no fever, no headache, no nausea, no shortness of breath and not vomiting    patient with onset of chest pain today. Substernal in nature. Nonradiating. Still present. 8/10. In addition patient has lost his primary care Dr. and will be starting up with [facility]. Patient took last dose of Xanax today. Patient been on Xanax long-term. They're concerned that he may have some withdrawal symptoms. Patient denies shortness of breath fever chills. Patient last seen in the emergency department here at [facility] on August 23. Patient does have a care plan.  Past Medical History  Diagnosis Date  . ADHD (attention deficit hyperactivity disorder)   . Back pain   . Shoulder pain   . Asthma    Past Surgical History  Procedure Laterality Date  . Upper gi endoscopy     Family History  Problem Relation Age of Onset  . Stroke Father   . Seizures Mother   . Seizures Brother    History  Substance Use Topics  . Smoking status: Never Smoker   . Smokeless tobacco: Never Used  . Alcohol Use: No    Review of Systems  Constitutional: Negative for fever.  HENT: Negative for congestion.   Eyes: Negative for visual disturbance.  Respiratory: Negative for shortness of breath.   Cardiovascular: Positive for chest pain. Negative for leg swelling.  Gastrointestinal: Negative for nausea, vomiting and abdominal pain.  Genitourinary: Negative for dysuria.  Musculoskeletal: Negative for back pain.  Skin: Negative for rash.  Neurological: Negative  for headaches.  Hematological: Does not bruise/bleed easily.  Psychiatric/Behavioral: Negative for confusion.      Allergies  Codeine; Lactose intolerance (gi); Percocet; and Penicillins  Home Medications   Prior to Admission medications   Medication Sig Start Date End Date Taking? Authorizing Provider  alprazolam Prudy Feeler) 2 MG tablet Take 2 mg by mouth 4 (four) times daily as needed. For anxiety   Yes Historical Provider, MD  EPINEPHrine (EPI-PEN) 0.3 mg/0.3 mL SOAJ injection Inject 0.3 mLs (0.3 mg total) into the muscle once. 07/26/13  Yes Donnetta Hutching, MD  famotidine (PEPCID) 20 MG tablet Take 1 tablet (20 mg total) by mouth 2 (two) times daily. 03/06/14  Yes Hope Orlene Och, NP  fluticasone (FLONASE) 50 MCG/ACT nasal spray Place 2 sprays into the nose daily.     Yes Historical Provider, MD  hydrOXYzine (ATARAX/VISTARIL) 25 MG tablet Take 2 tablets (50 mg total) by mouth at bedtime as needed for itching. 06/17/13  Yes Sunnie Nielsen, MD  lithium carbonate 300 MG capsule Take 300-600 mg by mouth 2 (two) times daily. Take one capsule (300mg ) every day in the morning, and take two capsules (600mg ) every day at bedtime   Yes Historical Provider, MD  propranolol (INNOPRAN XL) 80 MG 24 hr capsule Take 80 mg by mouth daily.    Yes Historical Provider, MD  QUEtiapine (SEROQUEL) 300 MG tablet Take 300 mg by mouth every evening.    Yes Historical Provider, MD  ranitidine (ZANTAC) 150 MG capsule Take 1 capsule (150 mg  total) by mouth daily. 06/13/13  Yes Benny LennertJoseph L Zammit, MD  ALPRAZolam Prudy Feeler(XANAX) 1 MG tablet Take 1 tablet (1 mg total) by mouth 2 (two) times daily as needed for anxiety. 07/26/14   Vanetta MuldersScott Shantel Helwig, MD   BP 105/56  Pulse 64  Temp(Src) 98.6 F (37 C) (Oral)  Resp 12  Ht 5\' 8"  (1.727 m)  Wt 125 lb (56.7 kg)  BMI 19.01 kg/m2  SpO2 100% Physical Exam  Nursing note and vitals reviewed. Constitutional: He is oriented to person, place, and time. He appears well-developed and well-nourished. No  distress.  HENT:  Head: Normocephalic and atraumatic.  Mouth/Throat: Oropharynx is clear and moist.  Eyes: Conjunctivae and EOM are normal. Pupils are equal, round, and reactive to light.  Neck: Normal range of motion. Neck supple.  Cardiovascular: Normal rate, regular rhythm and normal heart sounds.   Pulmonary/Chest: Effort normal and breath sounds normal. No respiratory distress.  Abdominal: Soft. Bowel sounds are normal. There is no tenderness.  Musculoskeletal: Normal range of motion. He exhibits no edema.  Neurological: He is alert and oriented to person, place, and time. No cranial nerve deficit. He exhibits normal muscle tone. Coordination normal.  Skin: Skin is warm. No rash noted.    ED Course  Procedures (including critical care time) Labs Review Labs Reviewed  CBC - Abnormal; Notable for the following:    Hemoglobin 11.2 (*)    HCT 34.4 (*)    MCV 63.8 (*)    MCH 20.8 (*)    All other components within normal limits  BASIC METABOLIC PANEL - Abnormal; Notable for the following:    Glucose, Bld 100 (*)    All other components within normal limits  TROPONIN I   Results for orders placed during the hospital encounter of 07/26/14  CBC      Result Value Ref Range   WBC 10.1  4.0 - 10.5 K/uL   RBC 5.39  4.22 - 5.81 MIL/uL   Hemoglobin 11.2 (*) 13.0 - 17.0 g/dL   HCT 14.734.4 (*) 82.939.0 - 56.252.0 %   MCV 63.8 (*) 78.0 - 100.0 fL   MCH 20.8 (*) 26.0 - 34.0 pg   MCHC 32.6  30.0 - 36.0 g/dL   RDW 13.015.1  86.511.5 - 78.415.5 %   Platelets 198  150 - 400 K/uL  BASIC METABOLIC PANEL      Result Value Ref Range   Sodium 139  137 - 147 mEq/L   Potassium 4.2  3.7 - 5.3 mEq/L   Chloride 101  96 - 112 mEq/L   CO2 27  19 - 32 mEq/L   Glucose, Bld 100 (*) 70 - 99 mg/dL   BUN 11  6 - 23 mg/dL   Creatinine, Ser 6.960.87  0.50 - 1.35 mg/dL   Calcium 29.510.1  8.4 - 28.410.5 mg/dL   GFR calc non Af Amer >90  >90 mL/min   GFR calc Af Amer >90  >90 mL/min   Anion gap 11  5 - 15  TROPONIN I      Result Value  Ref Range   Troponin I <0.30  <0.30 ng/mL     Imaging Review Dg Chest 2 View  07/26/2014   CLINICAL DATA:  23 year old patient with chest pain.  Panic attack.  EXAM: CHEST  2 VIEW  COMPARISON:  Chest radiograph 06/08/2014  FINDINGS: The heart size and mediastinal contours are within normal limits. Both lungs are clear. The visualized skeletal structures are unremarkable.  IMPRESSION: No  active cardiopulmonary disease.   Electronically Signed   By: Britta Mccreedy M.D.   On: 07/26/2014 20:41     EKG Interpretation   Date/Time:  Thursday July 26 2014 18:08:42 EDT Ventricular Rate:  46 PR Interval:  178 QRS Duration: 128 QT Interval:  430 QTC Calculation: 376 R Axis:   48 Text Interpretation:  Sinus bradycardia with sinus arrhythmia Non-specific  intra-ventricular conduction block Abnormal ECG Confirmed by Hallel Denherder   MD, Avondre Richens ([id number]) on 07/26/2014 6:46:57 PM      MDM   Final diagnoses:  Chest pain, unspecified chest pain type    The patient's care plan noted. Is not requesting pain medicine. Patient states that recently lost to followup with his primary care Dr. and being switched to [facility]. Patient took last dose of Xanax today. He's been on this long-term. Has been taking 2 mg. Will decrease it down to 1 mg and have him take it just twice a day. Short course provided. It is possible that this could be a new drug seeking behavior on this patient we have not seen this consistently. However it keeps coming back for Xanax that not we may need to add this to the care plan.  Patient's workup for the chest pain is negative no evidence of pneumonia or pneumothorax troponins negative EKG without any acute changes. Did have a sinus bradycardia. Patient's nontoxic no acute distress.   Vanetta Mulders, MD 07/26/14 [phone]

## 2014-07-26 NOTE — ED Notes (Addendum)
Chest pain , Out of xanax for 1 month. Feels dizzy, Pupils large

## 2014-07-26 NOTE — Discharge Instructions (Signed)
Chest pain workup negative for any acute findings. Keep your appointment with [facility]. Short course of renewal on the Xanax provided. Take as directed.

## 2014-08-02 ENCOUNTER — Encounter (HOSPITAL_COMMUNITY): Payer: Self-pay | Admitting: Emergency Medicine

## 2014-08-02 ENCOUNTER — Emergency Department (HOSPITAL_COMMUNITY): Payer: Medicaid Other

## 2014-08-02 ENCOUNTER — Emergency Department (HOSPITAL_COMMUNITY)
Admission: EM | Admit: 2014-08-02 | Discharge: 2014-08-02 | Discharge status: Against Medical Advice | Payer: Medicaid Other | Attending: Emergency Medicine | Admitting: Emergency Medicine

## 2014-08-02 DIAGNOSIS — J45909 Unspecified asthma, uncomplicated: Secondary | ICD-10-CM | POA: Diagnosis not present

## 2014-08-02 DIAGNOSIS — Z880 Allergy status to penicillin: Secondary | ICD-10-CM | POA: Diagnosis not present

## 2014-08-02 DIAGNOSIS — R079 Chest pain, unspecified: Secondary | ICD-10-CM | POA: Insufficient documentation

## 2014-08-02 DIAGNOSIS — F909 Attention-deficit hyperactivity disorder, unspecified type: Secondary | ICD-10-CM | POA: Diagnosis not present

## 2014-08-02 DIAGNOSIS — Z79899 Other long term (current) drug therapy: Secondary | ICD-10-CM | POA: Insufficient documentation

## 2014-08-02 DIAGNOSIS — Z7951 Long term (current) use of inhaled steroids: Secondary | ICD-10-CM | POA: Insufficient documentation

## 2014-08-02 DIAGNOSIS — R51 Headache: Secondary | ICD-10-CM | POA: Insufficient documentation

## 2014-08-02 DIAGNOSIS — R0602 Shortness of breath: Secondary | ICD-10-CM | POA: Diagnosis not present

## 2014-08-02 NOTE — ED Notes (Signed)
Chest pain, onset today. Alert, Nad, says he feels sob also

## 2014-08-02 NOTE — ED Provider Notes (Signed)
CSN: [id number]     Arrival date & time 08/02/14  2108 History  This chart was scribe for No att. providers found by Angelene GiovanniEmmanuella Mensah, ED Scribe. The patient was seen in room APA08/APA08 and the patient's care was started at 11:20 PM.    Chief Complaint  Patient presents with  . Chest Pain    The history is provided by the patient. No language interpreter was used.   HPI Comments: Eugene Hicks is a 23 y.o. male who presents to the Emergency Department complaining of intermittent left side CP that started 6:30pm today. He rates the pain as a 6/10 that does not radiate. He states that he has mild SOB and a mild headache. The patient denies nausea and vomiting as well as abdominal pain. Patient believes that he is currently going through withdraw and he last took a Xanax at 11 am this morning. He has a history of similar symptoms when he last went through withdrawal.    Past Medical History  Diagnosis Date  . ADHD (attention deficit hyperactivity disorder)   . Back pain   . Shoulder pain   . Asthma    Past Surgical History  Procedure Laterality Date  . Upper gi endoscopy     Family History  Problem Relation Age of Onset  . Stroke Father   . Seizures Mother   . Seizures Brother    History  Substance Use Topics  . Smoking status: Never Smoker   . Smokeless tobacco: Never Used  . Alcohol Use: No    Review of Systems  Constitutional: Negative for fever and chills.  HENT: Negative for rhinorrhea.   Eyes: Negative for discharge and visual disturbance.  Respiratory: Positive for shortness of breath. Negative for cough.   Cardiovascular: Positive for chest pain.  Gastrointestinal: Negative for nausea and vomiting.  Genitourinary: Negative for dysuria.  Skin: Negative for rash.  Neurological: Positive for headaches.  Psychiatric/Behavioral: Negative for confusion.      Allergies  Codeine; Lactose intolerance (gi); Percocet; and Penicillins  Home Medications   Prior  to Admission medications   Medication Sig Start Date End Date Taking? Authorizing Provider  ALPRAZolam Prudy Feeler(XANAX) 1 MG tablet Take 1 tablet (1 mg total) by mouth 2 (two) times daily as needed for anxiety. 07/26/14  Yes Vanetta MuldersScott Tallulah Hosman, MD  famotidine (PEPCID) 20 MG tablet Take 1 tablet (20 mg total) by mouth 2 (two) times daily. 03/06/14  Yes Hope Orlene OchM Neese, NP  fluticasone (FLONASE) 50 MCG/ACT nasal spray Place 2 sprays into the nose daily.     Yes Historical Provider, MD  hydrOXYzine (ATARAX/VISTARIL) 25 MG tablet Take 2 tablets (50 mg total) by mouth at bedtime as needed for itching. 06/17/13  Yes Sunnie NielsenBrian Opitz, MD  lithium carbonate 300 MG capsule Take 300-600 mg by mouth 2 (two) times daily. Take one capsule (300mg ) every day in the morning, and take two capsules (600mg ) every day at bedtime   Yes Historical Provider, MD  propranolol (INNOPRAN XL) 80 MG 24 hr capsule Take 80 mg by mouth daily.    Yes Historical Provider, MD  QUEtiapine (SEROQUEL) 300 MG tablet Take 300 mg by mouth every evening.    Yes Historical Provider, MD  ranitidine (ZANTAC) 150 MG capsule Take 1 capsule (150 mg total) by mouth daily. 06/13/13  Yes Benny LennertJoseph L Zammit, MD  EPINEPHrine (EPI-PEN) 0.3 mg/0.3 mL SOAJ injection Inject 0.3 mLs (0.3 mg total) into the muscle once. 07/26/13   Donnetta HutchingBrian Cook, MD  BP 115/67  Pulse 68  Temp(Src) 98 F (36.7 C) (Oral)  Resp 20  Ht 5\' 8"  (1.727 m)  Wt 135 lb (61.236 kg)  BMI 20.53 kg/m2  SpO2 100% Physical Exam  Nursing note and vitals reviewed. Constitutional: He is oriented to person, place, and time. He appears well-developed and well-nourished.  HENT:  Head: Normocephalic and atraumatic.  Mouth/Throat: Oropharynx is clear and moist.  Eyes: Conjunctivae and EOM are normal.  Neck: Normal range of motion.  Cardiovascular: Normal rate and regular rhythm.   No murmur heard. Pulmonary/Chest: Effort normal.  Abdominal: Soft. Bowel sounds are normal. He exhibits no distension. There is no  tenderness.  abd non tender  Musculoskeletal: Normal range of motion. He exhibits no edema.  Neurological: He is alert and oriented to person, place, and time. No cranial nerve deficit. He exhibits normal muscle tone. Coordination normal.  Skin: Skin is warm and dry.  Psychiatric: He has a normal mood and affect.    ED Course  Procedures (including critical care time) Labs Review Labs Reviewed  CBC WITH DIFFERENTIAL    Imaging Review Dg Chest 2 View  08/02/2014   CLINICAL DATA:  23 year old male with left-sided chest pain.  EXAM: CHEST - 2 VIEW  COMPARISON:  07/26/2014  FINDINGS: Cardiomediastinal silhouette within normal limits in size and contour. No pulmonary vascular congestion.  No confluent airspace disease, pneumothorax, pleural effusion.  No displaced fracture.  Unremarkable appearance of the upper abdomen.  IMPRESSION: No radiographic evidence of acute cardiopulmonary disease.  Signed,  Yvone NeuJaime S. Loreta AveWagner, DO  [facility]  [facility]   Electronically Signed   By: Gilmer MorJaime  Wagner D.O.   On: 08/02/2014 21:44     EKG Interpretation None      Date: 08/03/2014  Rate: 76  Rhythm: normal sinus rhythm  QRS Axis: normal  Intervals: normal  ST/T Wave abnormalities: normal  Conduction Disutrbances:first-degree A-V block  and nonspecific intraventricular conduction delay  Narrative Interpretation:   Old EKG Reviewed: none available      MDM   Final diagnoses:  Chest pain, unspecified chest pain type    The patient left after being seen by us. Technically left AMA. Patient required chest pain workup EKG without any acute changes. Patient frequently comes in inquiring about pain medicine and/or antidiarrhea medicines. Patient appeared in no acute distress. Left part of completion of workup.    I personally performed the services described in this documentation, which was scribed in my presence. The recorded information has been  reviewed and is accurate.      Vanetta MuldersScott Devere Brem, MD 08/03/14 [phone]

## 2014-08-02 NOTE — ED Notes (Addendum)
Patient states "i think im going through withdraws from xanax" patient states his chest is hurting and he is "shaking"   No visible tremors at this time.  Patient states "i took a xanax at [employer]11am"

## 2014-08-02 NOTE — ED Notes (Signed)
Called to patients room by phlebotomist, stating "the patient is not in the room"  Went to patients room at this time, patient is not found. Checked restrooms and emergency department, patient is out of department. EDP notified

## 2014-08-06 ENCOUNTER — Encounter (HOSPITAL_COMMUNITY): Payer: Self-pay | Admitting: Emergency Medicine

## 2014-08-06 ENCOUNTER — Emergency Department (HOSPITAL_COMMUNITY)
Admission: EM | Admit: 2014-08-06 | Discharge: 2014-08-06 | Disposition: A | Discharge status: Home / Self Care | Payer: Medicaid Other | Attending: Emergency Medicine | Admitting: Emergency Medicine

## 2014-08-06 DIAGNOSIS — Z880 Allergy status to penicillin: Secondary | ICD-10-CM | POA: Insufficient documentation

## 2014-08-06 DIAGNOSIS — G8929 Other chronic pain: Secondary | ICD-10-CM

## 2014-08-06 DIAGNOSIS — M549 Dorsalgia, unspecified: Secondary | ICD-10-CM

## 2014-08-06 DIAGNOSIS — Z7951 Long term (current) use of inhaled steroids: Secondary | ICD-10-CM | POA: Insufficient documentation

## 2014-08-06 DIAGNOSIS — Z8659 Personal history of other mental and behavioral disorders: Secondary | ICD-10-CM | POA: Diagnosis not present

## 2014-08-06 DIAGNOSIS — J45909 Unspecified asthma, uncomplicated: Secondary | ICD-10-CM | POA: Insufficient documentation

## 2014-08-06 DIAGNOSIS — Z79899 Other long term (current) drug therapy: Secondary | ICD-10-CM | POA: Insufficient documentation

## 2014-08-06 DIAGNOSIS — M545 Low back pain: Secondary | ICD-10-CM | POA: Insufficient documentation

## 2014-08-06 MED ORDER — HYDROCODONE-ACETAMINOPHEN 5-325 MG PO TABS: 1.0000 | ORAL_TABLET | Freq: Four times a day (QID) | ORAL | Status: DC | PRN

## 2014-08-06 MED ORDER — CYCLOBENZAPRINE HCL 10 MG PO TABS: 5.0000 mg | ORAL_TABLET | Freq: Two times a day (BID) | ORAL | Status: DC | PRN

## 2014-08-06 MED ORDER — CYCLOBENZAPRINE HCL 10 MG PO TABS
10.0000 mg | ORAL_TABLET | Freq: Once | ORAL | Status: AC
  Administered 2014-08-06: 10 mg via ORAL
  Filled 2014-08-06: qty 1

## 2014-08-06 NOTE — Discharge Instructions (Signed)
The flexeril may make you sleepy.  Follow up with your primary care doctor to have him help you get into pain management.

## 2014-08-06 NOTE — ED Notes (Signed)
Pt c/o chronic back pain with "spasms down my leg that make me feel paralyzed"

## 2014-08-06 NOTE — ED Provider Notes (Signed)
CSN: [id number]     Arrival date & time 08/06/14  1843 History   First MD Initiated Contact with Patient 08/06/14 2006     Chief Complaint  Patient presents with  . Back Pain     (Consider location/radiation/quality/duration/timing/severity/associated sxs/prior Treatment) Patient is a 23 y.o. male presenting with back pain. The history is provided by the patient.  Back Pain Location:  Lumbar spine Quality:  Aching Radiates to:  L posterior upper leg Pain severity:  Severe Pain is:  Same all the time Onset quality:  Gradual Duration:  3 hours Timing:  Constant Progression:  Worsening Relieved by:  Nothing Worsened by:  Ambulation, movement and standing Ineffective treatments: tylenol. Associated symptoms: leg pain   Associated symptoms: no bladder incontinence and no bowel incontinence    Eugene Hicks is a 23 y.o. male who presents to the ED with low back pain. Patient with hx of chronic low back pain and has a care plan.   Past Medical History  Diagnosis Date  . ADHD (attention deficit hyperactivity disorder)   . Back pain   . Shoulder pain   . Asthma    Past Surgical History  Procedure Laterality Date  . Upper gi endoscopy     Family History  Problem Relation Age of Onset  . Stroke Father   . Seizures Mother   . Seizures Brother    History  Substance Use Topics  . Smoking status: Never Smoker   . Smokeless tobacco: Never Used  . Alcohol Use: No    Review of Systems  Gastrointestinal: Negative for bowel incontinence.  Genitourinary: Negative for bladder incontinence.  Musculoskeletal: Positive for back pain.  all other systems negative    Allergies  Codeine; Lactose intolerance (gi); Percocet; and Penicillins  Home Medications   Prior to Admission medications   Medication Sig Start Date End Date Taking? Authorizing Provider  ALPRAZolam Prudy Feeler(XANAX) 1 MG tablet Take 1 tablet (1 mg total) by mouth 2 (two) times daily as needed for anxiety. 07/26/14    Vanetta MuldersScott Zackowski, MD  cyclobenzaprine (FLEXERIL) 10 MG tablet Take 0.5 tablets (5 mg total) by mouth 2 (two) times daily as needed for muscle spasms. 08/06/14   Hope Orlene OchM Neese, NP  EPINEPHrine (EPI-PEN) 0.3 mg/0.3 mL SOAJ injection Inject 0.3 mLs (0.3 mg total) into the muscle once. 07/26/13   Donnetta HutchingBrian Cook, MD  famotidine (PEPCID) 20 MG tablet Take 1 tablet (20 mg total) by mouth 2 (two) times daily. 03/06/14   Hope Orlene OchM Neese, NP  fluticasone (FLONASE) 50 MCG/ACT nasal spray Place 2 sprays into the nose daily.      Historical Provider, MD  HYDROcodone-acetaminophen (NORCO) 5-325 MG per tablet Take 1 tablet by mouth every 6 (six) hours as needed for moderate pain. 08/06/14   Hope Orlene OchM Neese, NP  hydrOXYzine (ATARAX/VISTARIL) 25 MG tablet Take 2 tablets (50 mg total) by mouth at bedtime as needed for itching. 06/17/13   Sunnie NielsenBrian Opitz, MD  lithium carbonate 300 MG capsule Take 300-600 mg by mouth 2 (two) times daily. Take one capsule (300mg ) every day in the morning, and take two capsules (600mg ) every day at bedtime    Historical Provider, MD  propranolol (INNOPRAN XL) 80 MG 24 hr capsule Take 80 mg by mouth daily.     Historical Provider, MD  QUEtiapine (SEROQUEL) 300 MG tablet Take 300 mg by mouth every evening.     Historical Provider, MD  ranitidine (ZANTAC) 150 MG capsule Take 1 capsule (150 mg  total) by mouth daily. 06/13/13   Benny Lennert, MD   BP 108/67  Pulse 65  Temp(Src) 98.2 F (36.8 C) (Oral)  Resp 20  Ht 5\' 8"  (1.727 m)  Wt 135 lb (61.236 kg)  BMI 20.53 kg/m2  SpO2 100% Physical Exam  Nursing note and vitals reviewed. Constitutional: He is oriented to person, place, and time. He appears well-developed and well-nourished. No distress.  HENT:  Head: Normocephalic and atraumatic.  Eyes: EOM are normal. Pupils are equal, round, and reactive to light.  Neck: Normal range of motion. Neck supple.  Cardiovascular: Normal rate and regular rhythm.   Pulmonary/Chest: Effort normal. No respiratory  distress. He has no wheezes. He has no rales.  Abdominal: Soft. Bowel sounds are normal. There is no tenderness.  Musculoskeletal: Normal range of motion. He exhibits no edema.       Lumbar back: He exhibits tenderness. He exhibits normal range of motion, no deformity, no spasm and normal pulse.       Back:  Neurological: He is alert and oriented to person, place, and time. He has normal strength. No cranial nerve deficit or sensory deficit. Coordination and gait normal.  Reflex Scores:      Bicep reflexes are 2+ on the right side and 2+ on the left side.      Brachioradialis reflexes are 2+ on the right side and 2+ on the left side.      Patellar reflexes are 2+ on the right side and 2+ on the left side.      Achilles reflexes are 2+ on the right side and 2+ on the left side. Ambulatory without foot drag.   Skin: Skin is warm and dry.  Psychiatric: He has a normal mood and affect. His behavior is normal.    ED Course  Procedures   MDM  23 y.o. male with low back pain and hx of chronic pain. Stable for discharge without neuro deficits. Will treat with flexeril and patient is to follow up with his PCP. Discussed with the patient that his PCP should help him get into a pain management clinic.    Medication List    TAKE these medications       cyclobenzaprine 10 MG tablet  Commonly known as:  FLEXERIL  Take 0.5 tablets (5 mg total) by mouth 2 (two) times daily as needed for muscle spasms.      ASK your doctor about these medications       ALPRAZolam 1 MG tablet  Commonly known as:  XANAX  Take 1 tablet (1 mg total) by mouth 2 (two) times daily as needed for anxiety.     EPINEPHrine 0.3 mg/0.3 mL Soaj injection  Commonly known as:  EPI-PEN  Inject 0.3 mLs (0.3 mg total) into the muscle once.     famotidine 20 MG tablet  Commonly known as:  PEPCID  Take 1 tablet (20 mg total) by mouth 2 (two) times daily.     fluticasone 50 MCG/ACT nasal spray  Commonly known as:  FLONASE    Place 2 sprays into the nose daily.     hydrOXYzine 25 MG tablet  Commonly known as:  ATARAX/VISTARIL  Take 2 tablets (50 mg total) by mouth at bedtime as needed for itching.     lithium carbonate 300 MG capsule  Take 300-600 mg by mouth 2 (two) times daily. Take one capsule (300mg ) every day in the morning, and take two capsules (600mg ) every day at bedtime  propranolol 80 MG 24 hr capsule  Commonly known as:  INNOPRAN XL  Take 80 mg by mouth daily.     QUEtiapine 300 MG tablet  Commonly known as:  SEROQUEL  Take 300 mg by mouth every evening.     ranitidine 150 MG capsule  Commonly known as:  ZANTAC  Take 1 capsule (150 mg total) by mouth daily.          [facility] Orlene OchM Neese, NP 08/07/14 [id number]

## 2014-08-09 NOTE — ED Provider Notes (Signed)
Medical screening examination/treatment/procedure(s) were performed by non-physician practitioner and as supervising physician I was immediately available for consultation/collaboration.     Danahi Reddish, MD 08/09/14 0709 

## 2014-08-26 ENCOUNTER — Encounter (HOSPITAL_COMMUNITY): Payer: Self-pay

## 2014-08-26 ENCOUNTER — Emergency Department (HOSPITAL_COMMUNITY)
Admission: EM | Admit: 2014-08-26 | Discharge: 2014-08-26 | Disposition: A | Discharge status: Home / Self Care | Payer: MEDICAID | Attending: Emergency Medicine | Admitting: Emergency Medicine

## 2014-08-26 DIAGNOSIS — J45909 Unspecified asthma, uncomplicated: Secondary | ICD-10-CM | POA: Diagnosis not present

## 2014-08-26 DIAGNOSIS — Z880 Allergy status to penicillin: Secondary | ICD-10-CM | POA: Diagnosis not present

## 2014-08-26 DIAGNOSIS — Z7951 Long term (current) use of inhaled steroids: Secondary | ICD-10-CM | POA: Insufficient documentation

## 2014-08-26 DIAGNOSIS — F1920 Other psychoactive substance dependence, uncomplicated: Secondary | ICD-10-CM | POA: Diagnosis not present

## 2014-08-26 DIAGNOSIS — F419 Anxiety disorder, unspecified: Secondary | ICD-10-CM | POA: Diagnosis present

## 2014-08-26 DIAGNOSIS — Z79899 Other long term (current) drug therapy: Secondary | ICD-10-CM | POA: Insufficient documentation

## 2014-08-26 MED ORDER — ALPRAZOLAM 1 MG PO TABS: 1.0000 mg | ORAL_TABLET | Freq: Three times a day (TID) | ORAL | Status: DC | PRN

## 2014-08-26 NOTE — Discharge Instructions (Signed)
Benzodiazepine Withdrawal  °Benzodiazepines are a group of drugs that are prescribed for both short-term and long-term treatment of a variety of medical conditions. For some of these conditions, such as seizures and sudden and severe muscle spasms, they are used only for a few hours or a few days. For other conditions, such as anxiety, sleep problems, or frequent muscle spasms or to help prevent seizures, they are used for an extended period, usually weeks or months. °Benzodiazepines work by changing the way your brain functions. Normally, chemicals in your brain called neurotransmitters send messages between your brain cells. The neurotransmitter that benzodiazepines affect is called gamma-aminobutyric acid (GABA). GABA sends out messages that have a calming effect on many of the functions of your brain. Benzodiazepines make these messages stronger and increase this calming effect. °Short-term use of benzodiazepines usually does not cause problems when you stop taking the drugs. However, if you take benzodiazepines for a long time, your body can adjust to the drug and require more of it to produce the same effect (drug tolerance). Eventually, you can develop physical dependence on benzodiazepines, which is when you experience negative effects if your dosage of benzodiazepines is reduced or stopped too quickly. These negative effects are called symptoms of withdrawal. °SYMPTOMS °Symptoms of withdrawal may begin anytime within the first 10 days after you stop taking the benzodiazepine. They can last from several weeks up to a few months but usually are the worst between the first 10 to 14 days.  °The actual symptoms also vary, depending on the type of benzodiazepine you take. Possible symptoms include: °· Anxiety. °· Excitability. °· Irritability. °· Depression. °· Mood swings. °· Trouble sleeping. °· Confusion. °· Uncontrollable shaking (tremors). °· Muscle weakness. °· Seizures. °DIAGNOSIS °To diagnose  benzodiazepine withdrawal, your caregiver will examine you for certain signs, such as: °· Rapid heartbeat. °· Rapid breathing. °· Tremors. °· High blood pressure. °· Fever. °· Mood changes. °Your caregiver also may ask the following questions about your use of benzodiazepines: °· What type of benzodiazepine did you take? °· How much did you take each day? °· How long did you take the drug? °· When was the last time you took the drug? °· Do you take any other drugs? °· Have you had alcohol recently? °· Have you had a seizure recently? °· Have you lost consciousness recently? °· Have you had trouble remembering recent events? °· Have you had a recent increase in anxiety, irritability, or trouble sleeping? °A drug test also may be administered. °TREATMENT °The treatment for benzodiazepine withdrawal can vary, depending on the type and severity of your symptoms, what type of benzodiazepine you have been taking, and how long you have been taking the benzodiazepine. Sometimes it is necessary for you to be treated in a hospital, especially if you are at risk of seizures.  °Often, treatment includes a prescription for a long-acting benzodiazepine, the dosage of which is reduced slowly over a long period. This period could be several weeks or months. Eventually, your dosage will be reduced to a point that you can stop taking the drug, without experiencing withdrawal symptoms. This is called tapered withdrawal. Occasionally, minor symptoms of withdrawal continue for a few days or weeks after you have completed a tapered withdrawal. °SEEK IMMEDIATE MEDICAL CARE IF: °· You have a seizure. °· You develop a craving for drugs or alcohol. °· You begin to experience symptoms of withdrawal during your tapered withdrawal. °· You become very confused. °· You lose consciousness. °· You   have trouble breathing. °· You think about hurting yourself or someone else. °Document Released: 09/24/2011 Document Revised: 12/28/2011 Document  Reviewed: 09/24/2011 °Exit[employer]® Patient Information ©2015 [employer]. This information is not intended to replace advice given to you by your health care provider. Make sure you discuss any questions you have with your health care provider. ° °

## 2014-08-26 NOTE — ED Provider Notes (Signed)
CSN: [id number]     Arrival date & time 08/26/14  1932 History   First MD Initiated Contact with Patient 08/26/14 2053     Chief Complaint  Patient presents with  . Anxiety     (Consider location/radiation/quality/duration/timing/severity/associated sxs/prior Treatment) Patient is a 23 y.o. male presenting with anxiety. The history is provided by the patient. No language interpreter was used.  Anxiety This is a new problem. The current episode started today. The problem occurs constantly. The problem has been gradually worsening. Nothing aggravates the symptoms. He has tried nothing for the symptoms. The treatment provided moderate relief.    Past Medical History  Diagnosis Date  . ADHD (attention deficit hyperactivity disorder)   . Back pain   . Shoulder pain   . Asthma    Past Surgical History  Procedure Laterality Date  . Upper gi endoscopy     Family History  Problem Relation Age of Onset  . Stroke Father   . Seizures Mother   . Seizures Brother    History  Substance Use Topics  . Smoking status: Never Smoker   . Smokeless tobacco: Never Used  . Alcohol Use: No    Review of Systems  All other systems reviewed and are negative.     Allergies  Codeine; Lactose intolerance (gi); Percocet; and Penicillins  Home Medications   Prior to Admission medications   Medication Sig Start Date End Date Taking? Authorizing Provider  ALPRAZolam Prudy Feeler(XANAX) 1 MG tablet Take 1 tablet (1 mg total) by mouth 2 (two) times daily as needed for anxiety. 07/26/14  Yes Vanetta MuldersScott Zackowski, MD  cyclobenzaprine (FLEXERIL) 10 MG tablet Take 0.5 tablets (5 mg total) by mouth 2 (two) times daily as needed for muscle spasms. 08/06/14  Yes Hope Orlene OchM Neese, NP  fluticasone (FLONASE) 50 MCG/ACT nasal spray Place 2 sprays into the nose daily.     Yes Historical Provider, MD  lithium carbonate 300 MG capsule Take 300-600 mg by mouth 2 (two) times daily. Take one capsule (300mg ) every day in the morning, and  take two capsules (600mg ) every day at bedtime   Yes Historical Provider, MD  propranolol (INNOPRAN XL) 80 MG 24 hr capsule Take 80 mg by mouth daily.    Yes Historical Provider, MD  QUEtiapine (SEROQUEL) 300 MG tablet Take 300 mg by mouth every evening.    Yes Historical Provider, MD  EPINEPHrine (EPI-PEN) 0.3 mg/0.3 mL SOAJ injection Inject 0.3 mLs (0.3 mg total) into the muscle once. 07/26/13   Donnetta HutchingBrian Cook, MD  famotidine (PEPCID) 20 MG tablet Take 1 tablet (20 mg total) by mouth 2 (two) times daily. Patient not taking: Reported on 08/26/2014 03/06/14   Janne NapoleonHope M Neese, NP  hydrOXYzine (ATARAX/VISTARIL) 25 MG tablet Take 2 tablets (50 mg total) by mouth at bedtime as needed for itching. Patient not taking: Reported on 08/26/2014 06/17/13   Sunnie NielsenBrian Opitz, MD  ranitidine (ZANTAC) 150 MG capsule Take 1 capsule (150 mg total) by mouth daily. Patient not taking: Reported on 08/26/2014 06/13/13   Benny LennertJoseph L Zammit, MD   BP 111/66 mmHg  Pulse 76  Temp(Src) 98.5 F (36.9 C) (Oral)  Resp 18  Ht 5\' 8"  (1.727 m)  Wt 133 lb (60.328 kg)  BMI 20.23 kg/m2  SpO2 99% Physical Exam  Constitutional: He is oriented to person, place, and time. He appears well-developed and well-nourished.  HENT:  Head: Normocephalic.  Right Ear: External ear normal.  Left Ear: External ear normal.  Nose: Nose normal.  Mouth/Throat: Oropharynx is clear and moist.  Eyes: Conjunctivae are normal. Pupils are equal, round, and reactive to light.  Neck: Normal range of motion. Neck supple.  Cardiovascular: Normal rate.   Pulmonary/Chest: Effort normal.  Musculoskeletal: Normal range of motion.  Neurological: He is alert and oriented to person, place, and time.  Skin: Skin is warm.  Psychiatric: He has a normal mood and affect.  Nursing note and vitals reviewed.   ED Course  Procedures (including critical care time) Labs Review Labs Reviewed - No data to display  Imaging Review No results found.   EKG  Interpretation   Date/Time:  Sunday August 26 2014 19:51:30 EST Ventricular Rate:  56 PR Interval:  184 QRS Duration: 138 QT Interval:  412 QTC Calculation: 397 R Axis:   80 Text Interpretation:  Sinus bradycardia with marked sinus arrhythmia  Non-specific intra-ventricular conduction block Since last tracing rate  slower (02 Aug 2014) [facility] by [facility]  [facility], IVA ([id number]) on 08/26/2014  9:04:07 PM      MDM  I am going to give pt a small number of xanax.   He needs to go to Mental health center for appropriate treatment   Final diagnoses:  Medication addiction, continuous     Xanax 1mg   12 tablets   Elson AreasLeslie K Anyra Kaufman, PA-C 08/26/14 2131  Juliet RudeNathan R. Rubin PayorPickering, MD 08/27/14 0025

## 2014-08-26 NOTE — ED Notes (Signed)
Took his last xanax 6pm tonight. Doesn't have a doctors appt until 11/14. Became anxious because it was his last xanax and started having panic attack 7pm. Now c/o chest pain right side.

## 2014-08-28 ENCOUNTER — Emergency Department (HOSPITAL_COMMUNITY)
Admission: EM | Admit: 2014-08-28 | Discharge: 2014-08-28 | Disposition: A | Discharge status: Home / Self Care | Payer: MEDICAID | Attending: Emergency Medicine | Admitting: Emergency Medicine

## 2014-08-28 ENCOUNTER — Encounter (HOSPITAL_COMMUNITY): Payer: Self-pay | Admitting: *Deleted

## 2014-08-28 DIAGNOSIS — Z79899 Other long term (current) drug therapy: Secondary | ICD-10-CM | POA: Diagnosis not present

## 2014-08-28 DIAGNOSIS — R1110 Vomiting, unspecified: Secondary | ICD-10-CM | POA: Diagnosis not present

## 2014-08-28 DIAGNOSIS — R110 Nausea: Secondary | ICD-10-CM | POA: Insufficient documentation

## 2014-08-28 DIAGNOSIS — Z7951 Long term (current) use of inhaled steroids: Secondary | ICD-10-CM | POA: Insufficient documentation

## 2014-08-28 DIAGNOSIS — J45909 Unspecified asthma, uncomplicated: Secondary | ICD-10-CM | POA: Insufficient documentation

## 2014-08-28 DIAGNOSIS — F419 Anxiety disorder, unspecified: Secondary | ICD-10-CM | POA: Diagnosis present

## 2014-08-28 DIAGNOSIS — R079 Chest pain, unspecified: Secondary | ICD-10-CM | POA: Insufficient documentation

## 2014-08-28 DIAGNOSIS — Z760 Encounter for issue of repeat prescription: Secondary | ICD-10-CM | POA: Diagnosis not present

## 2014-08-28 DIAGNOSIS — Z880 Allergy status to penicillin: Secondary | ICD-10-CM | POA: Insufficient documentation

## 2014-08-28 DIAGNOSIS — Z8659 Personal history of other mental and behavioral disorders: Secondary | ICD-10-CM | POA: Insufficient documentation

## 2014-08-28 NOTE — ED Notes (Signed)
Pt not in room times 2 to discharge.

## 2014-08-28 NOTE — Discharge Instructions (Signed)
your prescription for Xanax will have to be refilled through your psychiatric, or primary care provider.

## 2014-08-28 NOTE — ED Provider Notes (Signed)
CSN: [id number]     Arrival date & time 08/28/14  1552 History   This chart was scribed for No att. providers found by [facility], ED Scribe. The patient was seen in APA09/APA09 and the patient's care was started at 5:16 PM.  Chief Complaint  Patient presents with  . Hematemesis   HPI  HPI Comments: Eugene Hicks is a [age] y.o. male who presents to the Emergency Department complaining of  anxiety. Pt has nausea, CP and vomiting as associated symptoms. He states he threw up once today and there was "a tiny little bit of blood in it". He states he saw a doctor today who would not refill his Xanax. He got anxious. He and his brother with the physician's office together. Currently both requested refills on medications. Both called 911 from outside the office to come to the hospital with "anxiety attacks".   Past Medical History  Diagnosis Date  . ADHD (attention deficit hyperactivity disorder)   . Back pain   . Shoulder pain   . Asthma    Past Surgical History  Procedure Laterality Date  . Upper gi endoscopy     Family History  Problem Relation Age of Onset  . Stroke Father   . Seizures Mother   . Seizures Brother    History  Substance Use Topics  . Smoking status: Never Smoker   . Smokeless tobacco: Never Used  . Alcohol Use: No    Review of Systems  Constitutional: Negative for fever, chills, diaphoresis, appetite change and fatigue.  HENT: Negative for mouth sores, sore throat and trouble swallowing.   Eyes: Negative for visual disturbance.  Respiratory: Negative for cough, chest tightness, shortness of breath and wheezing.   Cardiovascular: Negative for chest pain.  Gastrointestinal: Positive for vomiting. Negative for nausea, abdominal pain, diarrhea and abdominal distention.  Endocrine: Negative for polydipsia, polyphagia and polyuria.  Genitourinary: Negative for dysuria, frequency and hematuria.  Musculoskeletal: Negative for gait problem.  Skin: Negative for  color change, pallor and rash.  Neurological: Negative for dizziness, syncope, light-headedness and headaches.  Hematological: Does not bruise/bleed easily.  Psychiatric/Behavioral: Negative for behavioral problems and confusion. The patient is nervous/anxious.       Allergies  Codeine; Lactose intolerance (gi); Percocet; and Penicillins  Home Medications   Prior to Admission medications   Medication Sig Start Date End Date Taking? Authorizing Provider  ALPRAZolam Prudy Feeler(XANAX) 1 MG tablet Take 1 tablet (1 mg total) by mouth 3 (three) times daily as needed for anxiety. 08/26/14   Elson AreasLeslie K Sofia, PA-C  cyclobenzaprine (FLEXERIL) 10 MG tablet Take 0.5 tablets (5 mg total) by mouth 2 (two) times daily as needed for muscle spasms. 08/06/14   Hope Orlene OchM Neese, NP  EPINEPHrine (EPI-PEN) 0.3 mg/0.3 mL SOAJ injection Inject 0.3 mLs (0.3 mg total) into the muscle once. 07/26/13   Donnetta HutchingBrian Cook, MD  famotidine (PEPCID) 20 MG tablet Take 1 tablet (20 mg total) by mouth 2 (two) times daily. Patient not taking: Reported on 08/26/2014 03/06/14   Janne NapoleonHope M Neese, NP  fluticasone [facility](FLONASE) 50 MCG/ACT nasal spray Place 2 sprays into the nose daily.      Historical Provider, MD  hydrOXYzine (ATARAX/VISTARIL) 25 MG tablet Take 2 tablets (50 mg total) by mouth at bedtime as needed for itching. Patient not taking: Reported on 08/26/2014 06/17/13   Sunnie NielsenBrian Opitz, MD  lithium carbonate 300 MG capsule Take 300-600 mg by mouth 2 (two) times daily. Take one capsule (300mg ) every day  in the morning, and take two capsules (600mg ) every day at bedtime    Historical Provider, MD  propranolol (INNOPRAN XL) 80 MG 24 hr capsule Take 80 mg by mouth daily.     Historical Provider, MD  QUEtiapine (SEROQUEL) 300 MG tablet Take 300 mg by mouth every evening.     Historical Provider, MD  ranitidine (ZANTAC) 150 MG capsule Take 1 capsule (150 mg total) by mouth daily. Patient not taking: Reported on 08/26/2014 06/13/13   Benny LennertJoseph L Zammit, MD   BP 107/56  mmHg  Pulse 62  Temp(Src) 98.5 F (36.9 C) (Oral)  Resp 16  Ht 5\' 8"  (1.727 m)  Wt 133 lb (60.328 kg)  BMI 20.23 kg/m2  SpO2 100% Physical Exam  Constitutional: He is oriented to person, place, and time. He appears well-developed and well-nourished. No distress.  HENT:  Head: Normocephalic.  Eyes: Conjunctivae are normal. Pupils are equal, round, and reactive to light. No scleral icterus.  Neck: Normal range of motion. Neck supple. No thyromegaly present.  Cardiovascular: Normal rate and regular rhythm.  Exam reveals no gallop and no friction rub.   No murmur heard. Pulmonary/Chest: Effort normal and breath sounds normal. No respiratory distress. He has no wheezes. He has no rales.  Abdominal: Soft. Bowel sounds are normal. He exhibits no distension. There is no tenderness. There is no rebound.  Musculoskeletal: Normal range of motion.  Neurological: He is alert and oriented to person, place, and time.  Skin: Skin is warm and dry. No rash noted.  Psychiatric: He has a normal mood and affect. His behavior is normal.    ED Course  Procedures  DIAGNOSTIC STUDIES: Oxygen Saturation is 100% on room air, normal by my interpretation.    COORDINATION OF CARE: 5:18 PM Discussed treatment plan with pt at bedside and pt agreed to plan.   Labs Review Labs Reviewed - No data to display  Imaging Review No results found.   EKG Interpretation None      MDM   Final diagnoses:  Anxiety    His conjunctivae are not pale. He is not tachycardic. He has benign abdomen. He is minimally anxious. He is conversant here. Told him that we do not provide benzodiazepine prescriptions to the emergency room for chronic anxiety and referred him to his primary care physician or mental health provider.    Rolland PorterMark Trysta Showman, MD 09/03/14 [phone]

## 2014-08-28 NOTE — ED Notes (Addendum)
Pt says he vomited blood today and that he is having pain in his chest.   Points to rt lower ant chest as site of pain

## 2014-10-12 ENCOUNTER — Emergency Department (HOSPITAL_COMMUNITY)
Admission: EM | Admit: 2014-10-12 | Discharge: 2014-10-12 | Disposition: A | Discharge status: Home / Self Care | Payer: Medicaid Other | Attending: Emergency Medicine | Admitting: Emergency Medicine

## 2014-10-12 ENCOUNTER — Emergency Department (HOSPITAL_COMMUNITY): Payer: Medicaid Other

## 2014-10-12 ENCOUNTER — Encounter (HOSPITAL_COMMUNITY): Payer: Self-pay | Admitting: *Deleted

## 2014-10-12 DIAGNOSIS — Z7951 Long term (current) use of inhaled steroids: Secondary | ICD-10-CM | POA: Diagnosis not present

## 2014-10-12 DIAGNOSIS — Z880 Allergy status to penicillin: Secondary | ICD-10-CM | POA: Diagnosis not present

## 2014-10-12 DIAGNOSIS — J45909 Unspecified asthma, uncomplicated: Secondary | ICD-10-CM | POA: Insufficient documentation

## 2014-10-12 DIAGNOSIS — E860 Dehydration: Secondary | ICD-10-CM | POA: Insufficient documentation

## 2014-10-12 DIAGNOSIS — R52 Pain, unspecified: Secondary | ICD-10-CM

## 2014-10-12 DIAGNOSIS — R1084 Generalized abdominal pain: Secondary | ICD-10-CM | POA: Diagnosis not present

## 2014-10-12 DIAGNOSIS — F419 Anxiety disorder, unspecified: Secondary | ICD-10-CM | POA: Insufficient documentation

## 2014-10-12 DIAGNOSIS — Z79899 Other long term (current) drug therapy: Secondary | ICD-10-CM | POA: Insufficient documentation

## 2014-10-12 DIAGNOSIS — Z8659 Personal history of other mental and behavioral disorders: Secondary | ICD-10-CM | POA: Diagnosis not present

## 2014-10-12 DIAGNOSIS — R197 Diarrhea, unspecified: Secondary | ICD-10-CM | POA: Diagnosis present

## 2014-10-12 LAB — CBC WITH DIFFERENTIAL/PLATELET
Basophils Absolute: 0 10*3/uL (ref 0.0–0.1)
Basophils Relative: 0 % (ref 0–1)
Eosinophils Absolute: 0.7 10*3/uL (ref 0.0–0.7)
Eosinophils Relative: 6 % — ABNORMAL HIGH (ref 0–5)
HEMATOCRIT: 35.3 % — AB (ref 39.0–52.0)
HEMOGLOBIN: 11 g/dL — AB (ref 13.0–17.0)
LYMPHS PCT: 17 % (ref 12–46)
Lymphs Abs: 2 10*3/uL (ref 0.7–4.0)
MCH: 20.2 pg — ABNORMAL LOW (ref 26.0–34.0)
MCHC: 31.2 g/dL (ref 30.0–36.0)
MCV: 64.8 fL — ABNORMAL LOW (ref 78.0–100.0)
MONO ABS: 0.7 10*3/uL (ref 0.1–1.0)
Monocytes Relative: 6 % (ref 3–12)
NEUTROS PCT: 71 % (ref 43–77)
Neutro Abs: 8.4 10*3/uL — ABNORMAL HIGH (ref 1.7–7.7)
Platelets: 214 10*3/uL (ref 150–400)
RBC: 5.45 MIL/uL (ref 4.22–5.81)
RDW: 15.3 % (ref 11.5–15.5)
WBC: 11.8 10*3/uL — AB (ref 4.0–10.5)

## 2014-10-12 LAB — COMPREHENSIVE METABOLIC PANEL
ALBUMIN: 4.7 g/dL (ref 3.5–5.2)
ALT: 15 U/L (ref 0–53)
AST: 16 U/L (ref 0–37)
Alkaline Phosphatase: 81 U/L (ref 39–117)
Anion gap: 7 (ref 5–15)
BILIRUBIN TOTAL: 0.7 mg/dL (ref 0.3–1.2)
BUN: 14 mg/dL (ref 6–23)
CHLORIDE: 105 meq/L (ref 96–112)
CO2: 28 mmol/L (ref 19–32)
CREATININE: 1.25 mg/dL (ref 0.50–1.35)
Calcium: 9.7 mg/dL (ref 8.4–10.5)
GFR calc Af Amer: >90 mL/min (ref >90)
GFR calc non Af Amer: 80 mL/min — ABNORMAL LOW (ref >90)
Glucose, Bld: 85 mg/dL (ref 70–99)
POTASSIUM: 3.8 mmol/L (ref 3.5–5.1)
Sodium: 140 mmol/L (ref 135–145)
Total Protein: 7.4 g/dL (ref 6.0–8.3)

## 2014-10-12 MED ORDER — SODIUM CHLORIDE 0.9 % IV BOLUS (SEPSIS)
1000.0000 mL | Freq: Once | INTRAVENOUS | Status: AC
  Administered 2014-10-12: 1000 mL via INTRAVENOUS

## 2014-10-12 MED ORDER — ALPRAZOLAM 0.5 MG PO TABS
1.0000 mg | ORAL_TABLET | Freq: Once | ORAL | Status: AC
  Administered 2014-10-12: 1 mg via ORAL
  Filled 2014-10-12: qty 2

## 2014-10-12 MED ORDER — ALPRAZOLAM 1 MG PO TABS: 1.0000 mg | ORAL_TABLET | Freq: Three times a day (TID) | ORAL | Status: DC | PRN

## 2014-10-12 NOTE — ED Provider Notes (Signed)
CSN: [id number]     Arrival date & time 10/12/14  1751 History   First MD Initiated Contact with Patient 10/12/14 1758     Chief Complaint  Patient presents with  . Diarrhea     (Consider location/radiation/quality/duration/timing/severity/associated sxs/prior Treatment) Patient is a 23 y.o. male presenting with diarrhea. The history is provided by the patient (the pt complains of diarhea and he ran out of his xanax 3 days ago).  Diarrhea Quality:  Watery Severity:  Moderate Onset quality:  Sudden Timing:  Constant Progression:  Unchanged Relieved by:  Nothing Associated symptoms: no abdominal pain and no headaches     Past Medical History  Diagnosis Date  . ADHD (attention deficit hyperactivity disorder)   . Back pain   . Shoulder pain   . Asthma    Past Surgical History  Procedure Laterality Date  . Upper gi endoscopy     Family History  Problem Relation Age of Onset  . Stroke Father   . Seizures Mother   . Seizures Brother    History  Substance Use Topics  . Smoking status: Never Smoker   . Smokeless tobacco: Never Used  . Alcohol Use: No    Review of Systems  Constitutional: Negative for appetite change and fatigue.  HENT: Negative for congestion, ear discharge and sinus pressure.   Eyes: Negative for discharge.  Respiratory: Negative for cough.   Cardiovascular: Negative for chest pain.  Gastrointestinal: Positive for diarrhea. Negative for abdominal pain.  Genitourinary: Negative for frequency and hematuria.  Musculoskeletal: Negative for back pain.  Skin: Negative for rash.  Neurological: Negative for seizures and headaches.  Psychiatric/Behavioral: Negative for hallucinations.      Allergies  Codeine; Lactose intolerance (gi); Percocet; and Penicillins  Home Medications   Prior to Admission medications   Medication Sig Start Date End Date Taking? Authorizing Provider  cyclobenzaprine (FLEXERIL) 10 MG tablet Take 0.5 tablets (5 mg total) by  mouth 2 (two) times daily as needed for muscle spasms. 08/06/14  Yes Hope Orlene OchM Neese, NP  EPINEPHrine (EPI-PEN) 0.3 mg/0.3 mL SOAJ injection Inject 0.3 mLs (0.3 mg total) into the muscle once. 07/26/13  Yes Donnetta HutchingBrian Cook, MD  fluticasone [facility](FLONASE) 50 MCG/ACT nasal spray Place 2 sprays into the nose daily.     Yes Historical Provider, MD  lithium carbonate 300 MG capsule Take 300-600 mg by mouth 2 (two) times daily. Take one capsule (300mg ) every day in the morning, and take two capsules (600mg ) every day at bedtime   Yes Historical Provider, MD  propranolol (INNOPRAN XL) 80 MG 24 hr capsule Take 80 mg by mouth daily.    Yes Historical Provider, MD  QUEtiapine (SEROQUEL) 300 MG tablet Take 300 mg by mouth every evening.    Yes Historical Provider, MD  ALPRAZolam Prudy Feeler(XANAX) 1 MG tablet Take 1 tablet (1 mg total) by mouth 3 (three) times daily as needed for anxiety. 10/12/14   Benny LennertJoseph L Tiersa Dayley, MD  famotidine (PEPCID) 20 MG tablet Take 1 tablet (20 mg total) by mouth 2 (two) times daily. Patient not taking: Reported on 08/26/2014 03/06/14   Janne NapoleonHope M Neese, NP  hydrOXYzine (ATARAX/VISTARIL) 25 MG tablet Take 2 tablets (50 mg total) by mouth at bedtime as needed for itching. Patient not taking: Reported on 08/26/2014 06/17/13   Sunnie NielsenBrian Opitz, MD  ranitidine (ZANTAC) 150 MG capsule Take 1 capsule (150 mg total) by mouth daily. Patient not taking: Reported on 08/26/2014 06/13/13   Benny LennertJoseph L Beckem Tomberlin, MD   BP  122/64 mmHg  Pulse 57  Temp(Src) 98.3 F (36.8 C) (Oral)  Resp 15  SpO2 100% Physical Exam  Constitutional: He is oriented to person, place, and time. He appears well-developed.  HENT:  Head: Normocephalic.  Eyes: Conjunctivae and EOM are normal. No scleral icterus.  Neck: Neck supple. No thyromegaly present.  Cardiovascular: Normal rate and regular rhythm.  Exam reveals no gallop and no friction rub.   No murmur heard. Pulmonary/Chest: No stridor. He has no wheezes. He has no rales. He exhibits no tenderness.   Abdominal: He exhibits no distension. There is no tenderness. There is no rebound.  Musculoskeletal: Normal range of motion. He exhibits no edema.  Lymphadenopathy:    He has no cervical adenopathy.  Neurological: He is oriented to person, place, and time. He exhibits normal muscle tone. Coordination normal.  Skin: No rash noted. No erythema.  Psychiatric: He has a normal mood and affect. His behavior is normal.    ED Course  Procedures (including critical care time) Labs Review Labs Reviewed  CBC WITH DIFFERENTIAL - Abnormal; Notable for the following:    WBC 11.8 (*)    Hemoglobin 11.0 (*)    HCT 35.3 (*)    MCV 64.8 (*)    MCH 20.2 (*)    Neutro Abs 8.4 (*)    Eosinophils Relative 6 (*)    All other components within normal limits  COMPREHENSIVE METABOLIC PANEL - Abnormal; Notable for the following:    GFR calc non Af Amer 80 (*)    All other components within normal limits    Imaging Review Dg Abd Acute W/chest  10/12/2014   CLINICAL DATA:  Generalized abdominal pain and diarrhea for 3 day  EXAM: ACUTE ABDOMEN SERIES (ABDOMEN 2 VIEW & CHEST 1 VIEW)  COMPARISON:  08/02/2014  FINDINGS: Normal heart size.  Clear lungs.  No free intraperitoneal gas. No disproportionate dilatation of bowel. The stomach is distended. No obvious pneumatosis.  IMPRESSION: No active cardiopulmonary disease  Gastric distension.   Electronically Signed   By: Maryclare BeanArt  Hoss M.D.   On: 10/12/2014 18:58     EKG Interpretation None      MDM   Final diagnoses:  Pain  Dehydration  Anxiety        Benny LennertJoseph L Shawana Knoch, MD 10/12/14 2028

## 2014-10-12 NOTE — ED Notes (Signed)
Father states pt has not has xanax in 3 days, feels like pt is going through withdrawal

## 2014-10-12 NOTE — Discharge Instructions (Signed)
Drink plenty of fluids.  And follow up with your md or [facility] in 1-2 weeks

## 2014-10-12 NOTE — ED Notes (Signed)
Pt with abd pain and diarrhea since 3 days ago, per father thinks pt has withdrawal from Xanax, last dose of Xanax was 3 days ago

## 2014-10-13 ENCOUNTER — Encounter (HOSPITAL_COMMUNITY): Payer: Self-pay | Admitting: Emergency Medicine

## 2014-10-13 ENCOUNTER — Emergency Department (HOSPITAL_COMMUNITY)
Admission: EM | Admit: 2014-10-13 | Discharge: 2014-10-13 | Disposition: A | Discharge status: Home / Self Care | Payer: Medicaid Other | Attending: Emergency Medicine | Admitting: Emergency Medicine

## 2014-10-13 DIAGNOSIS — Z880 Allergy status to penicillin: Secondary | ICD-10-CM | POA: Insufficient documentation

## 2014-10-13 DIAGNOSIS — H9201 Otalgia, right ear: Secondary | ICD-10-CM | POA: Diagnosis not present

## 2014-10-13 DIAGNOSIS — H9191 Unspecified hearing loss, right ear: Secondary | ICD-10-CM | POA: Diagnosis not present

## 2014-10-13 DIAGNOSIS — Z7951 Long term (current) use of inhaled steroids: Secondary | ICD-10-CM | POA: Diagnosis not present

## 2014-10-13 DIAGNOSIS — Z79899 Other long term (current) drug therapy: Secondary | ICD-10-CM | POA: Diagnosis not present

## 2014-10-13 DIAGNOSIS — J45909 Unspecified asthma, uncomplicated: Secondary | ICD-10-CM | POA: Insufficient documentation

## 2014-10-13 DIAGNOSIS — Z8659 Personal history of other mental and behavioral disorders: Secondary | ICD-10-CM | POA: Diagnosis not present

## 2014-10-13 NOTE — ED Notes (Signed)
Patient complaining of bilateral ear pain. Reports they feel stopped up and he is having trouble hearing.

## 2014-10-13 NOTE — ED Notes (Signed)
Patient with no complaints at this time. Respirations even and unlabored. Skin warm/dry. Discharge instructions reviewed with patient at this time. Patient given opportunity to voice concerns/ask questions. Patient discharged at this time and left Emergency Department with steady gait.   

## 2014-10-13 NOTE — ED Notes (Signed)
C/O bilateral ear pain since last night.  TMs intact w/good cone of light. No external canal erythema, mild cerumen.  Denies fevers, chills, sore throat, HA, chest pain, congestion.

## 2014-10-13 NOTE — Discharge Instructions (Signed)
Otalgia  Otalgia is pain in or around the ear. When the pain is from the ear itself it is called primary otalgia. Pain may also be coming from somewhere else, like the head and neck. This is called secondary otalgia.   CAUSES   Causes of primary otalgia include:  · Middle ear infection.  · It can also be caused by injury to the ear or infection of the ear canal (swimmer's ear). Swimmer's ear causes pain, swelling and often drainage from the ear canal.  Causes of secondary otalgia include:  · Sinus infections.  · Allergies and colds that cause stuffiness of the nose and tubes that drain the ears (eustachian tubes).  · Dental problems like cavities, gum infections or teething.  · Sore Throat (tonsillitis and pharyngitis).  · Swollen glands in the neck.  · Infection of the bone behind the ear (mastoiditis).  · TMJ discomfort (problems with the joint between your jaw and your skull).  · Other problems such as nerve disorders, circulation problems, heart disease and tumors of the head and neck can also cause symptoms of ear pain. This is rare.  DIAGNOSIS   Evaluation, Diagnosis and Testing:  · Examination by your medical caregiver is recommended to evaluate and diagnose the cause of otalgia.  · Further testing or referral to a specialist may be indicated if the cause of the ear pain is not found and the symptom persists.  TREATMENT   · Your doctor may prescribe antibiotics if an ear infection is diagnosed.  · Pain relievers and topical analgesics may be recommended.  · It is important to take all medications as prescribed.  HOME CARE INSTRUCTIONS   · It may be helpful to sleep with the painful ear in the up position.  · A warm compress over the painful ear may provide relief.  · A soft diet and avoiding gum may help while ear pain is present.  SEEK IMMEDIATE MEDICAL CARE IF:  · You develop severe pain, a high fever, repeated vomiting or dehydration.  · You develop extreme dizziness, headache, confusion, ringing in the  ears (tinnitus) or hearing loss.  Document Released: 11/12/2004 Document Revised: 12/28/2011 Document Reviewed: 08/14/2009  Exit[employer]® Patient Information ©2015 [employer]. This information is not intended to replace advice given to you by your health care provider. Make sure you discuss any questions you have with your health care provider.

## 2014-10-15 NOTE — ED Provider Notes (Signed)
CSN: [id number]     Arrival date & time 10/13/14  1819 History   First MD Initiated Contact with Patient 10/13/14 2043     Chief Complaint  Patient presents with  . Otalgia     (Consider location/radiation/quality/duration/timing/severity/associated sxs/prior Treatment) HPI   Eugene Hicks is a 23 y.o. male with frequent visits to this ed with various complaints, today with bilateral ear pain and decreased hearing acuity in the right ear. He believes he has cerumen impactions.  He denies fevers, chills, nasal congestion or drainage, also denies headache, sore throat or ear drainage.  He has found no alleviators for his symptoms.     Past Medical History  Diagnosis Date  . ADHD (attention deficit hyperactivity disorder)   . Back pain   . Shoulder pain   . Asthma    Past Surgical History  Procedure Laterality Date  . Upper gi endoscopy     Family History  Problem Relation Age of Onset  . Stroke Father   . Seizures Mother   . Seizures Brother    History  Substance Use Topics  . Smoking status: Never Smoker   . Smokeless tobacco: Never Used  . Alcohol Use: No    Review of Systems  Constitutional: Negative for fever and chills.  HENT: Positive for ear pain and hearing loss. Negative for congestion, ear discharge, rhinorrhea, sinus pressure, sore throat, trouble swallowing and voice change.   Eyes: Negative for discharge.  Respiratory: Negative for cough, shortness of breath, wheezing and stridor.   Cardiovascular: Negative for chest pain.  Gastrointestinal: Negative for abdominal pain.  Genitourinary: Negative.       Allergies  Codeine; Lactose intolerance (gi); Percocet; and Penicillins  Home Medications   Prior to Admission medications   Medication Sig Start Date End Date Taking? Authorizing Provider  ALPRAZolam Prudy Feeler(XANAX) 1 MG tablet Take 1 tablet (1 mg total) by mouth 3 (three) times daily as needed for anxiety. 10/12/14   Benny LennertJoseph L Zammit, MD  ALPRAZolam  Prudy Feeler(XANAX) 1 MG tablet Take 1 tablet (1 mg total) by mouth 3 (three) times daily as needed for anxiety. 10/12/14   Benny LennertJoseph L Zammit, MD  cyclobenzaprine (FLEXERIL) 10 MG tablet Take 0.5 tablets (5 mg total) by mouth 2 (two) times daily as needed for muscle spasms. 08/06/14   Hope Orlene OchM Neese, NP  EPINEPHrine (EPI-PEN) 0.3 mg/0.3 mL SOAJ injection Inject 0.3 mLs (0.3 mg total) into the muscle once. 07/26/13   Donnetta HutchingBrian Cook, MD  famotidine (PEPCID) 20 MG tablet Take 1 tablet (20 mg total) by mouth 2 (two) times daily. Patient not taking: Reported on 08/26/2014 03/06/14   Janne NapoleonHope M Neese, NP  fluticasone [facility](FLONASE) 50 MCG/ACT nasal spray Place 2 sprays into the nose daily.      Historical Provider, MD  hydrOXYzine (ATARAX/VISTARIL) 25 MG tablet Take 2 tablets (50 mg total) by mouth at bedtime as needed for itching. Patient not taking: Reported on 08/26/2014 06/17/13   Sunnie NielsenBrian Opitz, MD  lithium carbonate 300 MG capsule Take 300-600 mg by mouth 2 (two) times daily. Take one capsule (300mg ) every day in the morning, and take two capsules (600mg ) every day at bedtime    Historical Provider, MD  propranolol (INNOPRAN XL) 80 MG 24 hr capsule Take 80 mg by mouth daily.     Historical Provider, MD  QUEtiapine (SEROQUEL) 300 MG tablet Take 300 mg by mouth every evening.     Historical Provider, MD  ranitidine (ZANTAC) 150 MG capsule Take 1  capsule (150 mg total) by mouth daily. Patient not taking: Reported on 08/26/2014 06/13/13   Benny LennertJoseph L Zammit, MD   BP 112/72 mmHg  Pulse 59  Temp(Src) 97.6 F (36.4 C) (Oral)  Resp 16  Ht 5\' 9"  (1.753 m)  Wt 135 lb (61.236 kg)  BMI 19.93 kg/m2  SpO2 100% Physical Exam  Constitutional: He is oriented to person, place, and time. He appears well-developed and well-nourished.  HENT:  Head: Normocephalic and atraumatic.  Right Ear: Tympanic membrane and ear canal normal. Tympanic membrane is not injected, not erythematous, not retracted and not bulging.  Left Ear: Tympanic membrane and ear  canal normal. Tympanic membrane is not injected, not erythematous, not retracted and not bulging.  Nose: Mucosal edema and rhinorrhea present.  Mouth/Throat: Uvula is midline, oropharynx is clear and moist and mucous membranes are normal. No oropharyngeal exudate, posterior oropharyngeal edema, posterior oropharyngeal erythema or tonsillar abscesses.  Normal exam with small amount of cerumen bilaterally without impaction.  Can clearly view entire TM,  No canal redness or swelling.    Eyes: Conjunctivae are normal.  Cardiovascular: Normal rate and normal heart sounds.   Pulmonary/Chest: Effort normal. No respiratory distress. He has no wheezes. He has no rales.  Abdominal: Soft. There is no tenderness.  Musculoskeletal: Normal range of motion.  Neurological: He is alert and oriented to person, place, and time.  Decreased hearing right ear to fine noise (glove rubbing).  Skin: Skin is warm and dry. No rash noted.  Psychiatric: He has a normal mood and affect.    ED Course  Procedures (including critical care time) Labs Review Labs Reviewed - No data to display  Imaging Review No results found.   EKG Interpretation None      MDM   Final diagnoses:  Otalgia of right ear    Pt had a small amount of cerumen bilaterally which was easily removed using curette.  He endorsed pain relief and he was once again able to hear from the right ear.  Pt frequently presents with various complaints with lack of physical findings.  Suspect possible malingering.  Advised f/u with his pcp for any further problems or concerns.    The patient appears reasonably screened and/or stabilized for discharge and I doubt any other medical condition or other [facility] requiring further screening, evaluation, or treatment in the ED at this time prior to discharge.     Burgess AmorJulie Alicen Donalson, PA-C 10/15/14 1209  Glynn OctaveStephen Rancour, MD 10/15/14 [phone]

## 2014-10-23 ENCOUNTER — Encounter (HOSPITAL_COMMUNITY): Payer: Self-pay | Admitting: Emergency Medicine

## 2014-10-23 ENCOUNTER — Emergency Department (HOSPITAL_COMMUNITY)
Admission: EM | Admit: 2014-10-23 | Discharge: 2014-10-23 | Disposition: A | Discharge status: Home / Self Care | Payer: Medicaid Other | Attending: Emergency Medicine | Admitting: Emergency Medicine

## 2014-10-23 DIAGNOSIS — Z79899 Other long term (current) drug therapy: Secondary | ICD-10-CM | POA: Insufficient documentation

## 2014-10-23 DIAGNOSIS — Z880 Allergy status to penicillin: Secondary | ICD-10-CM | POA: Insufficient documentation

## 2014-10-23 DIAGNOSIS — F909 Attention-deficit hyperactivity disorder, unspecified type: Secondary | ICD-10-CM | POA: Insufficient documentation

## 2014-10-23 DIAGNOSIS — Z7952 Long term (current) use of systemic steroids: Secondary | ICD-10-CM | POA: Diagnosis not present

## 2014-10-23 DIAGNOSIS — Z760 Encounter for issue of repeat prescription: Secondary | ICD-10-CM | POA: Diagnosis not present

## 2014-10-23 DIAGNOSIS — J45909 Unspecified asthma, uncomplicated: Secondary | ICD-10-CM | POA: Diagnosis not present

## 2014-10-23 DIAGNOSIS — Z139 Encounter for screening, unspecified: Secondary | ICD-10-CM

## 2014-10-23 NOTE — ED Notes (Signed)
Per father patient "out of his xanax." [facility]. Last xanax taken 2 days ago.

## 2014-10-23 NOTE — Discharge Instructions (Signed)
°Emergency Department Resource Guide °1) Find a Doctor and Pay Out of Pocket °Although you won't have to find out who is covered by your insurance plan, it is a good idea to ask around and get recommendations. You will then need to call the office and see if the doctor you have chosen will accept you as a new patient and what types of options they offer for patients who are self-pay. Some doctors offer discounts or will set up payment plans for their patients who do not have insurance, but you will need to ask so you aren't surprised when you get to your appointment. ° °2) Contact Your Local Health Department °Not all health departments have doctors that can see patients for sick visits, but many do, so it is worth a call to see if yours does. If you don't know where your local health department is, you can check in your phone book. The CDC also has a tool to help you locate your state's health department, and many state websites also have listings of all of their local health departments. ° °3) Find a Walk-in Clinic °If your illness is not likely to be very severe or complicated, you may want to try a walk in clinic. These are popping up all over the country in pharmacies, drugstores, and shopping centers. They're usually staffed by nurse practitioners or physician assistants that have been trained to treat common illnesses and complaints. They're usually fairly quick and inexpensive. However, if you have serious medical issues or chronic medical problems, these are probably not your best option. ° °No Primary Care Doctor: °- Call [facility] at  832-8000 - they can help you locate a primary care doctor that  accepts your insurance, provides certain services, etc. °- Physician Referral Service- 1-800-533-3463 ° °Chronic Pain Problems: °Organization         Address  Phone   Notes  °[facility]  (336) 297-2271 Patients need to be referred by their primary care doctor.  ° °Medication  Assistance: °Organization         Address  Phone   Notes  °[place] [facility] [address] °[place], [place] [address] (336) 641-8030 --Must be a resident of [place] °-- Must have NO insurance coverage whatsoever (no Medicaid/ Medicare, etc.) °-- The pt. MUST have a primary care doctor that directs their care regularly and follows them in the community °  °[facility]  (866) 331-1348   °[employer]  (888) 892-1162   ° °Agencies that provide inexpensive medical care: °Organization         Address  Phone   Notes  °[facility]  (336) 832-8035   °[facility]    (336) 832-7272   °[facility] [address] °[place], [place] [address] (336) 832-4777   °[facility] [address], °[place] (336) 271-4999   °[facility]    (336) 373-0678   °[facility]    (336) 272-1050   °[facility] ° [address], [place] Phone:  (336) 832-4444, Fax:  (336) 832-4440 Hours of Operation:  9 am - 6 pm, M-F.  Also accepts Medicaid/Medicare and self-pay.  °[facility] ° [address],  Phone: (336) 832-3150, Fax: (336) 832-3151. Hours of Operation:  8:30 am - 5:30 pm, M-F.  Also accepts Medicaid and self-pay.  °[facility] [place] [address], [place] Phone: (336) 878-6027   °[facility] [address], [place], [place] (336)723-1848, Ext. 123 Mondays & Thursdays: 7-9 AM.  First 15 patients are seen on a first come, first serve basis. °  ° °Medicaid-accepting [place] Providers: ° °Organization         Address  Phone   Notes  °[facility] [address], [place] (336) 641-2100 Also accepts self-pay patients.  °[facility] [address], [place] ° (336) 856-9996   °[facility] [address], [place]  (336) 288-8857   °[facility] [address] [place] [address], [place] (336) 299-7000   °Veita Bland [address], [place]  ° (336) 373-1557 Only accepts [place] Access Medicaid patients after they have their name applied to their card.  ° °Self-Pay (no insurance) in [place]: ° °Organization         Address  Phone   Notes  °Sickle Cell Patients, [facility] [address], [place] (336) 832-1970   °[facility] [address], [place] (336) 832-4400   °[facility] ° [address],  (336) 992-4800   °[facility]/Dr. Osei-Bonsu ° [address] [place] [address], [place] or [address], [place] (336) 841-8500 Phone number for both [place] and [place] locations is the same.  °[facility] [address], [place] (336) 299-0000   °[facility] [address] [place] [address], [place] or [address] (336) 852-7530 °(336) 878-2260   °[facility] [address], [place] (336) 350-1642, phone; (336) 294-5005, fax Sees patients 1st and 3rd Saturday of every month.  Must not qualify for public or private insurance (i.e. Medicaid, Medicare, [place] Health Choice, Veterans' Benefits) • Household income should be no more than 200% of the poverty level •The clinic cannot treat you if you are pregnant or think you are pregnant • Sexually transmitted diseases are not treated at the clinic.  ° ° °Dental Care: °Organization         Address  Phone  Notes  °[place] [facility] [address], [place] (336) 641-6152 Accepts children up to age 21 who are enrolled in Medicaid or [place] Health Choice; pregnant women with a Medicaid card; and children who have applied for Medicaid or [place] Health Choice, but were declined, whose parents can pay a reduced fee at time of service.  °[place]  [facility] [place]  [address], [place] (336) 641-7733 Accepts children up to age 21 who are enrolled in Medicaid or [place] Health Choice; pregnant women with a Medicaid card; and children who have applied for Medicaid or [place] Health Choice, but were declined, whose parents can pay a reduced fee at time of service.  °[facility] ° [address], [place] (336) 641-4533 Patients are seen by appointment only. Walk-ins are not accepted. [facility] will see patients 18 years of age and older. °Monday - Tuesday (8am-5pm) °Most Wednesdays (8:30-5pm) °$30 per visit, cash only  °[facility] ° [address], [place] (336) 641-4533 Patients are seen by appointment only. Walk-ins are not accepted. [facility] will see patients 18 years of age and older. °One   Wednesday Evening (Monthly: Volunteer Based).  $30 per visit, cash only  °[facility]  (919) 537-3737 for adults; Children under age 4, call Graduate Pediatric Dentistry at (919) [phone]. Children aged 4-14, please call (919) [phone] to request a pediatric application. ° Dental services are provided in all areas of dental care including fillings, crowns and bridges, complete and partial dentures, implants, gum treatment, root canals, and extractions. Preventive care is also provided. Treatment is provided to both adults and children. °Patients are selected via a lottery and there is often a waiting list. °  °[facility] [address], °[place] ° (336) 763-8833 [email or web address] °  °[facility] [address], [place], [place] (336)723-1848, Ext. 123 Second and Fourth Thursday of each month, opens at 6:30 AM; Clinic ends at 9 AM.  Patients are seen on a first-come first-served basis, and a limited number are seen during each clinic.  ° °[facility] ° [address], [place], [place] (336) 723-7904    Eligibility Requirements °You must have lived in [place], [place], or [place] counties for at least the last three months. °  You cannot be eligible for state or federal sponsored healthcare insurance, including Veterans Administration, Medicaid, or Medicare. °  You generally cannot be eligible for healthcare insurance through your employer.  °  How to apply: °Eligibility screenings are held every Tuesday and Wednesday afternoon from 1:00 pm until 4:00 pm. You do not need an appointment for the interview!  °[facility] [address], [place], [place] 336-631-2330   °[facility]  336-342-8273   °[facility]  336-703-3100   °[facility]  336-570-6415   ° °Behavioral Health Resources in the Community: °Intensive Outpatient Programs °Organization         Address  Phone  Notes  °[facility] [address], [place], [place] 336-878-6098   °[facility] [address], [place], [place] 336-832-9800   °[facility] [address], [place], [place] ° 336-882-2125   °[facility] [address],  °[place], [place] 1-800-853-5163 or 336-641-4981   °Substance Abuse Resources °Organization         Address  Phone  Notes  °[facility]  336-882-2125   °[facility]  336-784-9470   °[facility]  336-285-9073   °[facility]  336-845-3988   °Residential & Outpatient Substance Abuse Program  1-800-659-3381   °Psychological Services °Organization         Address  Phone  Notes  °[facility]  336- 832-9600   °[facility]  336- 378-7881   °[facility] [address], [place] 1-800-853-5163 or 336-641-4981   ° °Mobile Crisis Teams °Organization         Address  Phone  Notes  °[facility], Mobile Crisis Care Unit  1-877-626-1772   °Assertive °[facility] ° [address]  [place], [place] 336-834-9664   °Sharon DeEsch [address] °[place] 336-554-5454   ° °Self-Help/Support Groups °Organization         Address  Phone             Notes  °[facility] [place] - variety of support groups  336- 373-1402 Call for more information  °Narcotics Anonymous (NA), [facility] [address], °[place]   2 meetings at this location  ° °  Residential Treatment Programs °Organization         Address  Phone  Notes  °[facility] [address],    °[place]  1-866-801-8205   °[facility] ° [address], [place], [place] 704-293-8524   °[facility] [address], [place] 336-845-3988 Admissions: 8am-3pm M-F  °[facility] [address]    °[place], [place] 336-841-1104   °[facility] [address], [place], [place] 336-379-7146   °[facility] [address]  °[place], [place] 336-285-9073   °[facility] - Intensive Outpatient [address], [place], [place] 336-852-3033   °[facility] [address]  °[place], [place] 1-877-615-2722 or 336-784-9470   °[facility] [address] [place], [place] 336-227-7417 Accepts Medicaid  °[facility] [address]  °[place] 1-800-659-3381 Substance Abuse/Addiction Treatment  ° °[place] Behavioral Health Resources °Organization         Address  Phone  Notes  °[facility]  (888) 581-9988   °Julie Brannon, PhD [address] [place], [place]   (336) 349-5553 or (336) 951-0000   °[facility]   [address] °[place], [place] (336) 349-4454   °[facility] [address], [place], [place] (336) 342-8316 Insurance/Medicaid/sponsorship through [facility]  °[facility] [address]                                    [place], [place] (336) 342-8316 Therapy/tele-psych/case    °[facility] [address]  ° , [place] (336) 349-2233    °Dr. Arfeen  (336) 349-4544   °[facility] [place]  [employer] [place] [facility] 1) [address],  °2) [address], [place] °3)  [address], [place] (336) 349-3220 °(336) 342-7768 ° °(336) 342-8140   °[place] [facility] (336) 342-1394 or (336) 342-3537 (After Hours)    ° ° °

## 2014-10-24 NOTE — ED Provider Notes (Signed)
CSN: [id number]     Arrival date & time 10/23/14  1655 History   First MD Initiated Contact with Patient 10/23/14 1755     Chief Complaint  Patient presents with  . Medication Refill     (Consider location/radiation/quality/duration/timing/severity/associated sxs/prior Treatment) HPI   Eugene Hicks is a 24 y.o. male who presents to the Emergency Department requesting medication refill for his xanax.  His father states that he normally takes three 1 mg tablets daily, but ran out two days ago and has an appt with [facility], but not until next Thursday.  He states that he "don't feel good."  He denies fever, recent illness, chills, seizure activity, chest pain or headaches.    Past Medical History  Diagnosis Date  . ADHD (attention deficit hyperactivity disorder)   . Back pain   . Shoulder pain   . Asthma    Past Surgical History  Procedure Laterality Date  . Upper gi endoscopy     Family History  Problem Relation Age of Onset  . Stroke Father   . Seizures Mother   . Seizures Brother    History  Substance Use Topics  . Smoking status: Never Smoker   . Smokeless tobacco: Never Used  . Alcohol Use: No    Review of Systems  Constitutional: Negative for fever, chills and fatigue.  HENT: Negative for sore throat and trouble swallowing.   Respiratory: Negative for cough, shortness of breath and wheezing.   Cardiovascular: Negative for chest pain and palpitations.  Gastrointestinal: Negative for nausea, vomiting and abdominal pain.  Genitourinary: Negative for dysuria.  Musculoskeletal: Negative for back pain, arthralgias, neck pain and neck stiffness.  Skin: Negative for rash.  Neurological: Negative for dizziness, seizures, syncope, weakness, numbness and headaches.  Hematological: Does not bruise/bleed easily.  All other systems reviewed and are negative.     Allergies  Codeine; Lactose intolerance (gi); Percocet; and Penicillins  Home Medications   Prior to  Admission medications   Medication Sig Start Date End Date Taking? Authorizing Provider  ALPRAZolam Prudy Feeler) 1 MG tablet Take 1 tablet (1 mg total) by mouth 3 (three) times daily as needed for anxiety. 10/12/14  Yes Benny Lennert, MD  cyclobenzaprine (FLEXERIL) 10 MG tablet Take 0.5 tablets (5 mg total) by mouth 2 (two) times daily as needed for muscle spasms. 08/06/14  Yes Hope Orlene Och, NP  fluticasone (FLONASE) 50 MCG/ACT nasal spray Place 2 sprays into the nose daily.     Yes Historical Provider, MD  lithium carbonate 300 MG capsule Take 300-600 mg by mouth 2 (two) times daily. Take one capsule ( ) every day in the morning, and take two capsules ( ) every day at bedtime   Yes Historical Provider, MD  propranolol (INNOPRAN XL) 80 MG 24 hr capsule Take 80 mg by mouth daily.    Yes Historical Provider, MD  QUEtiapine (SEROQUEL) 300 MG tablet Take 300 mg by mouth every evening.    Yes Historical Provider, MD  ALPRAZolam Prudy Feeler) 1 MG tablet Take 1 tablet (1 mg total) by mouth 3 (three) times daily as needed for anxiety. Patient not taking: Reported on 10/23/2014 10/12/14   Benny Lennert, MD  EPINEPHrine (EPI-PEN) 0.3 mg/0.3 mL SOAJ injection Inject 0.3 mLs (0.3 mg total) into the muscle once. 07/26/13   Donnetta Hutching, MD  famotidine (PEPCID) 20 MG tablet Take 1 tablet (20 mg total) by mouth 2 (two) times daily. Patient not taking: Reported on 08/26/2014 03/06/14   [facility]  Damian LeavellNeese, NP   BP 117/64 mmHg  Pulse 54  Temp(Src) 98.1 F (36.7 C) (Oral)  Resp 16  Ht 5\' 6"  (1.676 m)  Wt 132 lb 11.2 oz (60.192 kg)  BMI 21.43 kg/m2  SpO2 99% Physical Exam  Constitutional: He is oriented to person, place, and time. He appears well-developed and well-nourished. No distress.  HENT:  Head: Normocephalic and atraumatic.  Mouth/Throat: Oropharynx is clear and moist.  Eyes: Conjunctivae are normal. Pupils are equal, round, and reactive to light.  Neck: Normal range of motion. Neck supple.  Cardiovascular:  Normal rate, regular rhythm and normal heart sounds.   Pulmonary/Chest: Effort normal and breath sounds normal. No respiratory distress. He exhibits no tenderness.  Musculoskeletal: Normal range of motion. He exhibits no tenderness.  Lymphadenopathy:    He has no cervical adenopathy.  Neurological: He is alert and oriented to person, place, and time. He exhibits normal muscle tone. Coordination normal.  Skin: Skin is warm and dry. No rash noted.  Psychiatric: He has a normal mood and affect. His behavior is normal.  Nursing note and vitals reviewed.   ED Course  Procedures (including critical care time) Labs Review Labs Reviewed - No data to display  Imaging Review No results found.   EKG Interpretation None      MDM   Final diagnoses:  Encounter for medical screening examination    Pt has multiple ED visits and care plan in place.  He is calm, well appearing, VSS.  No clinical symptoms for benzo withdrawal at this time.  Was seen here on 10/13/14 and given Rx for #20 Xanax.  I have advised patient and his father that further xanax's are not indicated, and that [facility].  Patient agrees to plan and appears stable for d/c    Rowyn Mustapha L. Trisha Mangleriplett, PA-C 10/24/14 0102  Rolland PorterMark James, MD 10/31/14 [phone]

## 2014-10-26 ENCOUNTER — Emergency Department (HOSPITAL_COMMUNITY)
Admission: EM | Admit: 2014-10-26 | Discharge: 2014-10-26 | Disposition: A | Discharge status: Home / Self Care | Payer: Medicaid Other | Attending: Emergency Medicine | Admitting: Emergency Medicine

## 2014-10-26 ENCOUNTER — Encounter (HOSPITAL_COMMUNITY): Payer: Self-pay

## 2014-10-26 DIAGNOSIS — Z8659 Personal history of other mental and behavioral disorders: Secondary | ICD-10-CM | POA: Insufficient documentation

## 2014-10-26 DIAGNOSIS — J45909 Unspecified asthma, uncomplicated: Secondary | ICD-10-CM | POA: Insufficient documentation

## 2014-10-26 DIAGNOSIS — K088 Other specified disorders of teeth and supporting structures: Secondary | ICD-10-CM | POA: Diagnosis not present

## 2014-10-26 DIAGNOSIS — Z7951 Long term (current) use of inhaled steroids: Secondary | ICD-10-CM | POA: Insufficient documentation

## 2014-10-26 DIAGNOSIS — K002 Abnormalities of size and form of teeth: Secondary | ICD-10-CM | POA: Diagnosis not present

## 2014-10-26 DIAGNOSIS — Z79899 Other long term (current) drug therapy: Secondary | ICD-10-CM | POA: Diagnosis not present

## 2014-10-26 DIAGNOSIS — Z880 Allergy status to penicillin: Secondary | ICD-10-CM | POA: Diagnosis not present

## 2014-10-26 DIAGNOSIS — K0889 Other specified disorders of teeth and supporting structures: Secondary | ICD-10-CM

## 2014-10-26 MED ORDER — LIDOCAINE VISCOUS 2 % MT SOLN
15.0000 mL | Freq: Once | OROMUCOSAL | Status: AC
  Administered 2014-10-26: 15 mL via OROMUCOSAL
  Filled 2014-10-26: qty 15

## 2014-10-26 MED ORDER — IBUPROFEN 800 MG PO TABS
800.0000 mg | ORAL_TABLET | Freq: Once | ORAL | Status: AC
Start: 2014-10-26 — End: 2014-10-26
  Administered 2014-10-26: 800 mg via ORAL
  Filled 2014-10-26: qty 1

## 2014-10-26 MED ORDER — IBUPROFEN 600 MG PO TABS: 600.0000 mg | ORAL_TABLET | Freq: Four times a day (QID) | ORAL | Status: DC | PRN

## 2014-10-26 NOTE — ED Notes (Signed)
Patient with no complaints at this time. Respirations even and unlabored. Skin warm/dry. Discharge instructions reviewed with patient at this time. Patient given opportunity to voice concerns/ask questions. Patient discharged at this time and left Emergency Department with steady gait.   

## 2014-10-26 NOTE — Discharge Instructions (Signed)
Dental Pain A tooth ache may be caused by cavities (tooth decay). Cavities expose the nerve of the tooth to air and hot or cold temperatures. It may come from an infection or abscess (also called a boil or furuncle) around your tooth. It is also often caused by dental caries (tooth decay). This causes the pain you are having. DIAGNOSIS  Your caregiver can diagnose this problem by exam. TREATMENT   If caused by an infection, it may be treated with medications which kill germs (antibiotics) and pain medications as prescribed by your caregiver. Take medications as directed.  Only take over-the-counter or prescription medicines for pain, discomfort, or fever as directed by your caregiver.  Whether the tooth ache today is caused by infection or dental disease, you should see your dentist as soon as possible for further care. SEEK MEDICAL CARE IF: The exam and treatment you received today has been provided on an emergency basis only. This is not a substitute for complete medical or dental care. If your problem worsens or new problems (symptoms) appear, and you are unable to meet with your dentist, call or return to this location. SEEK IMMEDIATE MEDICAL CARE IF:   You have a fever.  You develop redness and swelling of your face, jaw, or neck.  You are unable to open your mouth.  You have severe pain uncontrolled by pain medicine. MAKE SURE YOU:   Understand these instructions.  Will watch your condition.  Will get help right away if you are not doing well or get worse. Document Released: 10/05/2005 Document Revised: 12/28/2011 Document Reviewed: 05/23/2008 [facility] Patient Information 2015 [employer] [place]. This information is not intended to replace advice given to you by your health care provider. Make sure you discuss any questions you have with your health care provider.   You may apply the pain medicine liquid using a cotton swab every 3 hours if needed for return of pain.

## 2014-10-26 NOTE — ED Notes (Signed)
All teeth, top and bottom, are broken almost to gumline w/caries.  No acutely fractured teeth noted on R lower jaw.

## 2014-10-26 NOTE — ED Notes (Signed)
Patient states he was eating a steak and "two of my teeth broke": right lower. Patient c/o of pain. NAD.

## 2014-10-27 ENCOUNTER — Encounter (HOSPITAL_COMMUNITY): Payer: Self-pay | Admitting: Emergency Medicine

## 2014-10-27 ENCOUNTER — Emergency Department (HOSPITAL_COMMUNITY)
Admission: EM | Admit: 2014-10-27 | Discharge: 2014-10-27 | Discharge status: Against Medical Advice | Payer: Medicaid Other | Attending: Emergency Medicine | Admitting: Emergency Medicine

## 2014-10-27 DIAGNOSIS — J45909 Unspecified asthma, uncomplicated: Secondary | ICD-10-CM | POA: Diagnosis not present

## 2014-10-27 DIAGNOSIS — W108XXA Fall (on) (from) other stairs and steps, initial encounter: Secondary | ICD-10-CM | POA: Insufficient documentation

## 2014-10-27 DIAGNOSIS — S3992XA Unspecified injury of lower back, initial encounter: Secondary | ICD-10-CM | POA: Insufficient documentation

## 2014-10-27 DIAGNOSIS — Y998 Other external cause status: Secondary | ICD-10-CM | POA: Diagnosis not present

## 2014-10-27 DIAGNOSIS — Y929 Unspecified place or not applicable: Secondary | ICD-10-CM | POA: Insufficient documentation

## 2014-10-27 DIAGNOSIS — Y9389 Activity, other specified: Secondary | ICD-10-CM | POA: Insufficient documentation

## 2014-10-27 NOTE — ED Provider Notes (Signed)
CSN: [id number]     Arrival date & time 10/26/14  1908 History   First MD Initiated Contact with Patient 10/26/14 1952     Chief Complaint  Patient presents with  . Dental Pain     (Consider location/radiation/quality/duration/timing/severity/associated sxs/prior Treatment) The history is provided by the patient.   Eugene Hicks is a 24 y.o. male with frequent visits to the emergency department presenting with right lower 2nd toothache which started tonight while eating his dinner.  He denies swelling around the tooth site, fractures, also denies fevers or chills, sore throat, facial swelling.  He has taken no medicines prior to arrival for this complaint.  He states he is scheduled to see his dentist next week for a routine check up.     Past Medical History  Diagnosis Date  . ADHD (attention deficit hyperactivity disorder)   . Back pain   . Shoulder pain   . Asthma    Past Surgical History  Procedure Laterality Date  . Upper gi endoscopy     Family History  Problem Relation Age of Onset  . Stroke Father   . Seizures Mother   . Seizures Brother    History  Substance Use Topics  . Smoking status: Never Smoker   . Smokeless tobacco: Never Used  . Alcohol Use: No    Review of Systems  Constitutional: Negative for fever.  HENT: Positive for dental problem. Negative for facial swelling and sore throat.   Respiratory: Negative for shortness of breath.   Musculoskeletal: Negative for neck pain and neck stiffness.      Allergies  Codeine; Lactose intolerance (gi); Percocet; and Penicillins  Home Medications   Prior to Admission medications   Medication Sig Start Date End Date Taking? Authorizing Provider  ALPRAZolam Prudy Feeler) 1 MG tablet Take 1 tablet (1 mg total) by mouth 3 (three) times daily as needed for anxiety. 10/12/14  Yes Benny Lennert, MD  cyclobenzaprine (FLEXERIL) 10 MG tablet Take 0.5 tablets (5 mg total) by mouth 2 (two) times daily as needed for  muscle spasms. 08/06/14  Yes Hope Orlene Och, NP  fluticasone (FLONASE) 50 MCG/ACT nasal spray Place 2 sprays into the nose daily.     Yes Historical Provider, MD  lithium carbonate 300 MG capsule Take 300-600 mg by mouth 2 (two) times daily. Take one capsule ( ) every day in the morning, and take two capsules ( ) every day at bedtime   Yes Historical Provider, MD  propranolol (INNOPRAN XL) 80 MG 24 hr capsule Take 80 mg by mouth daily.    Yes Historical Provider, MD  QUEtiapine (SEROQUEL) 300 MG tablet Take 300 mg by mouth every evening.    Yes Historical Provider, MD  EPINEPHrine (EPI-PEN) 0.3 mg/0.3 mL SOAJ injection Inject 0.3 mLs (0.3 mg total) into the muscle once. 07/26/13   Donnetta Hutching, MD  famotidine (PEPCID) 20 MG tablet Take 1 tablet (20 mg total) by mouth 2 (two) times daily. Patient not taking: Reported on 08/26/2014 03/06/14   [name], NP  ibuprofen (ADVIL,MOTRIN) 600 MG tablet Take 1 tablet (600 mg total) by mouth every 6 (six) hours as needed. 10/26/14   Burgess Amor, PA-C   BP 100/59 mmHg  Pulse 65  Temp(Src) 97.8 F (36.6 C) (Oral)  Resp 20  Ht  (1.702 m)  Wt 133 lb (60.328 kg)  BMI 20.83 kg/m2  SpO2 99% Physical Exam  Constitutional: He is oriented to person, place, and time. He appears well-developed  and well-nourished. No distress.  HENT:  Head: Normocephalic and atraumatic.  Right Ear: Tympanic membrane and external ear normal.  Left Ear: Tympanic membrane and external ear normal.  Mouth/Throat: Oropharynx is clear and moist and mucous membranes are normal. No oral lesions. No trismus in the jaw. Abnormal dentition. No dental abscesses.  All teeth are worn nearly to the gingival line.  Old appearing exposed dental pulp present.  No gingival edema, erythema.  No facial swelling or erythema either.    Eyes: Conjunctivae are normal.  Neck: Normal range of motion. Neck supple.  Cardiovascular: Normal rate and normal heart sounds.   Pulmonary/Chest: Effort  normal.  Abdominal: He exhibits no distension.  Musculoskeletal: Normal range of motion.  Lymphadenopathy:    He has no cervical adenopathy.  Neurological: He is alert and oriented to person, place, and time.  Skin: Skin is warm and dry. No erythema.  Psychiatric: He has a normal mood and affect.    ED Course  Procedures (including critical care time) Labs Review Labs Reviewed - No data to display  Imaging Review No results found.   EKG Interpretation None      MDM   Final diagnoses:  Pain, dental    Pt was given viscous lidocaine for topical use, first dose applied here with cotton swab.  Ibuprofen. F/u with dentist this week as planned.  No new or acute findings.  Multiple prior ed visits with new dentition problems recognized by this provider.    Burgess AmorJulie Woodford Strege, PA-C 10/27/14 0106  Raeford RazorStephen Kohut, MD 10/27/14 [phone]

## 2014-10-27 NOTE — ED Notes (Signed)
PT c/o lower back pain and stated he fell down two wooden steps today around 1300. PT ambulatory in triage with NAD noted.

## 2014-10-27 NOTE — ED Notes (Signed)
Daytime staff report they could not locate patient in all waiting areas x 3

## 2014-10-28 ENCOUNTER — Emergency Department (HOSPITAL_COMMUNITY)
Admission: EM | Admit: 2014-10-28 | Discharge: 2014-10-28 | Disposition: A | Discharge status: Home / Self Care | Payer: Medicaid Other | Attending: Emergency Medicine | Admitting: Emergency Medicine

## 2014-10-28 ENCOUNTER — Encounter (HOSPITAL_COMMUNITY): Payer: Self-pay | Admitting: Emergency Medicine

## 2014-10-28 DIAGNOSIS — Z880 Allergy status to penicillin: Secondary | ICD-10-CM | POA: Insufficient documentation

## 2014-10-28 DIAGNOSIS — Z79899 Other long term (current) drug therapy: Secondary | ICD-10-CM | POA: Insufficient documentation

## 2014-10-28 DIAGNOSIS — F909 Attention-deficit hyperactivity disorder, unspecified type: Secondary | ICD-10-CM | POA: Diagnosis not present

## 2014-10-28 DIAGNOSIS — R251 Tremor, unspecified: Secondary | ICD-10-CM | POA: Diagnosis present

## 2014-10-28 DIAGNOSIS — Z7952 Long term (current) use of systemic steroids: Secondary | ICD-10-CM | POA: Diagnosis not present

## 2014-10-28 DIAGNOSIS — Z760 Encounter for issue of repeat prescription: Secondary | ICD-10-CM

## 2014-10-28 DIAGNOSIS — J45909 Unspecified asthma, uncomplicated: Secondary | ICD-10-CM | POA: Insufficient documentation

## 2014-10-28 LAB — CBC WITH DIFFERENTIAL/PLATELET
BASOS ABS: 0.1 10*3/uL (ref 0.0–0.1)
Basophils Relative: 0 % (ref 0–1)
Eosinophils Absolute: 1.1 10*3/uL — ABNORMAL HIGH (ref 0.0–0.7)
Eosinophils Relative: 9 % — ABNORMAL HIGH (ref 0–5)
HCT: 34.5 % — ABNORMAL LOW (ref 39.0–52.0)
HEMOGLOBIN: 10.9 g/dL — AB (ref 13.0–17.0)
Lymphocytes Relative: 20 % (ref 12–46)
Lymphs Abs: 2.3 10*3/uL (ref 0.7–4.0)
MCH: 20.1 pg — ABNORMAL LOW (ref 26.0–34.0)
MCHC: 31.6 g/dL (ref 30.0–36.0)
MCV: 63.7 fL — AB (ref 78.0–100.0)
MONO ABS: 0.6 10*3/uL (ref 0.1–1.0)
MONOS PCT: 6 % (ref 3–12)
Neutro Abs: 7.1 10*3/uL (ref 1.7–7.7)
Neutrophils Relative %: 64 % (ref 43–77)
Platelets: 185 10*3/uL (ref 150–400)
RBC: 5.42 MIL/uL (ref 4.22–5.81)
RDW: 15.1 % (ref 11.5–15.5)
WBC: 11.1 10*3/uL — ABNORMAL HIGH (ref 4.0–10.5)

## 2014-10-28 LAB — COMPREHENSIVE METABOLIC PANEL
ALT: 17 U/L (ref 0–53)
ANION GAP: 4 — AB (ref 5–15)
AST: 23 U/L (ref 0–37)
Albumin: 4.8 g/dL (ref 3.5–5.2)
Alkaline Phosphatase: 80 U/L (ref 39–117)
BILIRUBIN TOTAL: 0.7 mg/dL (ref 0.3–1.2)
BUN: 17 mg/dL (ref 6–23)
CALCIUM: 9.6 mg/dL (ref 8.4–10.5)
CO2: 27 mmol/L (ref 19–32)
CREATININE: 0.96 mg/dL (ref 0.50–1.35)
Chloride: 106 mEq/L (ref 96–112)
GFR calc Af Amer: >90 mL/min (ref >90)
GFR calc non Af Amer: >90 mL/min (ref >90)
Glucose, Bld: 92 mg/dL (ref 70–99)
POTASSIUM: 4.3 mmol/L (ref 3.5–5.1)
Sodium: 137 mmol/L (ref 135–145)
Total Protein: 7.4 g/dL (ref 6.0–8.3)

## 2014-10-28 LAB — LITHIUM LEVEL: Lithium Lvl: 0.77 mmol/L — ABNORMAL LOW (ref 0.80–1.40)

## 2014-10-28 MED ORDER — ALPRAZOLAM 1 MG PO TABS: ORAL_TABLET | ORAL | Status: DC

## 2014-10-28 NOTE — ED Notes (Signed)
Patient brought in via EMS. Alert, confused to day and time. Patient c/o tremors. Per patient feels this way when lithium level is elevated.

## 2014-10-28 NOTE — ED Notes (Signed)
Snack given.

## 2014-10-28 NOTE — ED Provider Notes (Signed)
CSN: [id number]     Arrival date & time 10/28/14  1820 History   First MD Initiated Contact with Patient 10/28/14 2058     Chief Complaint  Patient presents with  . Tremors     (Consider location/radiation/quality/duration/timing/severity/associated sxs/prior Treatment) The history is provided by the patient and a parent.   Eugene Hicks is a 24 y.o. male who frequently presents to this ed with various complaints of pain.  He presents today with concerns of dizziness, confusion, tremor and altered gait,  Stating he had difficulty walking to his mailbox prior to arrival and fell down due to lightheadedness.  Father at bedside states he is concerned he is going through xanax withdrawal, although patient states he felt this way the last time his lithium level was elevated.  He was undergoing a xanax taper after being on 2 mg qid for the past several years.  He lost his pcp and most recently was undergoing this taper through [facility],  Describing most recently was down to 1 mg tablet taken twice daily.  He ran out of his medicine 2 days ago.  Father is currently searching for a new pcp for him,  In the meantime, patient does not want to return to this clinic.  Pt reports he feels better since arriving here tonight, tremor is improved and he has been able to ambulate without dizziness.     Past Medical History  Diagnosis Date  . ADHD (attention deficit hyperactivity disorder)   . Back pain   . Shoulder pain   . Asthma    Past Surgical History  Procedure Laterality Date  . Upper gi endoscopy     Family History  Problem Relation Age of Onset  . Stroke Father   . Seizures Mother   . Seizures Brother    History  Substance Use Topics  . Smoking status: Never Smoker   . Smokeless tobacco: Never Used  . Alcohol Use: No    Review of Systems  Constitutional: Negative for fever.  HENT: Negative for congestion and sore throat.   Eyes: Negative.   Respiratory:  Negative for chest tightness and shortness of breath.   Cardiovascular: Negative for chest pain.  Gastrointestinal: Negative for nausea and abdominal pain.  Genitourinary: Negative.   Musculoskeletal: Negative for joint swelling, arthralgias and neck pain.  Skin: Negative.  Negative for rash and wound.  Neurological: Positive for light-headedness. Negative for dizziness, weakness, numbness and headaches.  Psychiatric/Behavioral: Negative.  Negative for agitation.      Allergies  Codeine; Lactose intolerance (gi); Percocet; and Penicillins  Home Medications   Prior to Admission medications   Medication Sig Start Date End Date Taking? Authorizing Provider  cyclobenzaprine (FLEXERIL) 10 MG tablet Take 0.5 tablets (5 mg total) by mouth 2 (two) times daily as needed for muscle spasms. 08/06/14  Yes Hope Orlene Och, NP  EPINEPHrine (EPI-PEN) 0.3 mg/0.3 mL SOAJ injection Inject 0.3 mLs (0.3 mg total) into the muscle once. 07/26/13  Yes Donnetta Hutching, MD  fluticasone [facility]) 50 MCG/ACT nasal spray Place 2 sprays into the nose daily.     Yes Historical Provider, MD  lithium carbonate 300 MG capsule Take 300-600 mg by mouth 2 (two) times daily. Take one capsule ( ) every day in the morning, and take two capsules ( ) every day at bedtime   Yes Historical Provider, MD  propranolol (INNOPRAN XL) 80 MG 24 hr capsule Take 80 mg by mouth daily.    Yes Historical  Provider, MD  QUEtiapine (SEROQUEL) 300 MG tablet Take 300 mg by mouth every evening.    Yes Historical Provider, MD  ALPRAZolam Prudy Feeler(XANAX) 1 MG tablet Take 1 tablet twice daily for 7 days, then take 1/2 tablet twice daily for 14 days, then 1/2 tablet once daily for 7 days. 10/28/14   Burgess AmorJulie Gayland Nicol, PA-C  famotidine (PEPCID) 20 MG tablet Take 1 tablet (20 mg total) by mouth 2 (two) times daily. Patient not taking: Reported on 08/26/2014 03/06/14   Janne NapoleonHope M Neese, NP  ibuprofen (ADVIL,MOTRIN) 600 MG tablet Take 1 tablet (600 mg total) by mouth every 6  (six) hours as needed. 10/26/14   Burgess AmorJulie Joie Reamer, PA-C   BP 105/53 mmHg  Pulse 67  Temp(Src) 98.7 F (37.1 C) (Oral)  Resp 20  Ht 5\' 5"  (1.651 m)  Wt 138 lb (62.596 kg)  BMI 22.96 kg/m2  SpO2 100% Physical Exam  Constitutional: He appears well-developed and well-nourished.  HENT:  Head: Normocephalic and atraumatic.  Eyes: Conjunctivae are normal.  Neck: Normal range of motion.  Cardiovascular: Normal rate, regular rhythm, normal heart sounds and intact distal pulses.   Pulmonary/Chest: Effort normal and breath sounds normal. He has no wheezes.  Abdominal: Soft. Bowel sounds are normal. There is no tenderness.  Musculoskeletal: Normal range of motion.  Neurological: He is alert.  Skin: Skin is warm and dry.  Psychiatric: He has a normal mood and affect.  Nursing note and vitals reviewed.   ED Course  Procedures (including critical care time) Labs Review Labs Reviewed  LITHIUM LEVEL - Abnormal; Notable for the following:    Lithium Lvl 0.77 (*)    All other components within normal limits  CBC WITH DIFFERENTIAL - Abnormal; Notable for the following:    WBC 11.1 (*)    Hemoglobin 10.9 (*)    HCT 34.5 (*)    MCV 63.7 (*)    MCH 20.1 (*)    Eosinophils Relative 9 (*)    Eosinophils Absolute 1.1 (*)    All other components within normal limits  COMPREHENSIVE METABOLIC PANEL - Abnormal; Notable for the following:    Anion gap 4 (*)    All other components within normal limits    Imaging Review No results found.   EKG Interpretation None      MDM   Final diagnoses:  Medication refill    Patients labs and/or radiological studies were viewed and considered during the medical decision making and disposition process. Pt discussed with Dr Adriana Simasook.  Will plan to give patient a definitive xanax taper.  Although patient does not have any constitutional sx of withdrawal at this time, he and father are concerned about the need for tapering this medicine with multiple previous  visits with same complaint.  [facility].  He was prescribed a 1 mg bid to 0.5 mg qd taper over the next 4 weeks in an attempt to end his frequent visits here for this problem.  He was told that this will successfully end his need for xanax in a safe manner and he will not require this medicine after this taper is done.    Burgess AmorJulie Riti Rollyson, PA-C 10/28/14 2310  Donnetta HutchingBrian Cook, MD 10/28/14 [id number]

## 2014-10-28 NOTE — Discharge Instructions (Signed)
Medication Refill, Emergency Department We have refilled your medication today as a courtesy to you. It is best for your medical care, however, to take care of getting refills done through your primary caregiver's office. They have your records and can do a better job of follow-up than we can in the emergency department. On maintenance medications, we often only prescribe enough medications to get you by until you are able to see your regular caregiver. This is a more expensive way to refill medications. In the future, please plan for refills so that you will not have to use the emergency department for this. Thank you for your help. Your help allows us to better take care of the daily emergencies that enter our department. Document Released: 01/22/2004 Document Revised: 12/28/2011 Document Reviewed: 01/12/2014 [facility] Patient Information 2015 [place] [place]. This information is not intended to replace advice given to you by your health care provider. Make sure you discuss any questions you have with your health care provider.   Take the xanax as prescribed to help you wean off of this medication.  By the end of this prescription,  You should not have any problems with withdrawal symptoms.  Your lab tests and exam are ok today.

## 2014-10-29 ENCOUNTER — Emergency Department (HOSPITAL_COMMUNITY)
Admission: EM | Admit: 2014-10-29 | Discharge: 2014-10-29 | Discharge status: Against Medical Advice | Payer: Medicaid Other | Attending: Emergency Medicine | Admitting: Emergency Medicine

## 2014-10-29 ENCOUNTER — Encounter (HOSPITAL_COMMUNITY): Payer: Self-pay | Admitting: *Deleted

## 2014-10-29 DIAGNOSIS — R42 Dizziness and giddiness: Secondary | ICD-10-CM | POA: Insufficient documentation

## 2014-10-29 DIAGNOSIS — J45909 Unspecified asthma, uncomplicated: Secondary | ICD-10-CM | POA: Diagnosis not present

## 2014-10-29 NOTE — ED Notes (Signed)
No answer

## 2014-10-29 NOTE — ED Notes (Signed)
Dizzy, intermittently for 2 days, seen here yesterday for same.  Alert, nl gait.

## 2014-10-30 ENCOUNTER — Encounter (HOSPITAL_COMMUNITY): Payer: Self-pay | Admitting: Cardiology

## 2014-10-30 ENCOUNTER — Emergency Department (HOSPITAL_COMMUNITY)
Admission: EM | Admit: 2014-10-30 | Discharge: 2014-10-30 | Disposition: A | Discharge status: Home / Self Care | Payer: Medicaid Other | Attending: Emergency Medicine | Admitting: Emergency Medicine

## 2014-10-30 DIAGNOSIS — Z7951 Long term (current) use of inhaled steroids: Secondary | ICD-10-CM | POA: Insufficient documentation

## 2014-10-30 DIAGNOSIS — Z8659 Personal history of other mental and behavioral disorders: Secondary | ICD-10-CM | POA: Insufficient documentation

## 2014-10-30 DIAGNOSIS — Z79899 Other long term (current) drug therapy: Secondary | ICD-10-CM | POA: Insufficient documentation

## 2014-10-30 DIAGNOSIS — Z880 Allergy status to penicillin: Secondary | ICD-10-CM | POA: Diagnosis not present

## 2014-10-30 DIAGNOSIS — J45909 Unspecified asthma, uncomplicated: Secondary | ICD-10-CM | POA: Insufficient documentation

## 2014-10-30 DIAGNOSIS — R42 Dizziness and giddiness: Secondary | ICD-10-CM | POA: Diagnosis present

## 2014-10-30 MED ORDER — MECLIZINE HCL 25 MG PO TABS: 25.0000 mg | ORAL_TABLET | Freq: Two times a day (BID) | ORAL | Status: DC | PRN

## 2014-10-30 NOTE — Discharge Instructions (Signed)
If you were given medicines take as directed.  If you are on coumadin or contraceptives realize their levels and effectiveness is altered by many different medicines.  If you have any reaction (rash, tongues swelling, other) to the medicines stop taking and see a physician.   Please follow up as directed and return to the ER or see a physician for new or worsening symptoms.  Thank you. Filed Vitals:   10/30/14 1752  BP: 108/63  Pulse: 67  Temp: 98 F (36.7 C)  TempSrc: Oral  Resp: 18  Height: 5\' 6"  (1.676 m)  Weight: 140 lb (63.504 kg)  SpO2: 99%

## 2014-10-30 NOTE — ED Provider Notes (Signed)
CSN: [id number]     Arrival date & time 10/30/14  1743 History   First MD Initiated Contact with Patient 10/30/14 1905     Chief Complaint  Patient presents with  . Dizziness     (Consider location/radiation/quality/duration/timing/severity/associated sxs/prior Treatment) HPI Comments: 24 year old male with ADHD, bipolar, multiple ED visits in the past 6 months presents with intermittent dizziness. No stroke history, no balance difficulty, no vision changes. No specific reason or time length of dizziness, mild room spinning sensation. Symptoms improved with time short lasting. Patient recently had blood work done yesterday.  Patient is a 24 y.o. male presenting with dizziness. The history is provided by the patient.  Dizziness Associated symptoms: no chest pain, no headaches, no shortness of breath and no vomiting     Past Medical History  Diagnosis Date  . ADHD (attention deficit hyperactivity disorder)   . Back pain   . Shoulder pain   . Asthma    Past Surgical History  Procedure Laterality Date  . Upper gi endoscopy     Family History  Problem Relation Age of Onset  . Stroke Father   . Seizures Mother   . Seizures Brother    History  Substance Use Topics  . Smoking status: Never Smoker   . Smokeless tobacco: Never Used  . Alcohol Use: No    Review of Systems  Constitutional: Negative for fever and chills.  HENT: Negative for congestion.   Eyes: Negative for visual disturbance.  Respiratory: Negative for shortness of breath.   Cardiovascular: Negative for chest pain.  Gastrointestinal: Negative for vomiting and abdominal pain.  Genitourinary: Negative for dysuria and flank pain.  Musculoskeletal: Negative for back pain, neck pain and neck stiffness.  Skin: Negative for rash.  Neurological: Positive for dizziness. Negative for light-headedness and headaches.      Allergies  Codeine; Lactose intolerance (gi); Percocet; and Penicillins  Home Medications    Prior to Admission medications   Medication Sig Start Date End Date Taking? Authorizing Provider  ALPRAZolam Prudy Feeler) 1 MG tablet Take 1 tablet twice daily for 7 days, then take 1/2 tablet twice daily for 14 days, then 1/2 tablet once daily for 7 days. 10/28/14  Yes Raynelle Fanning Idol, PA-C  cyclobenzaprine (FLEXERIL) 10 MG tablet Take 0.5 tablets (5 mg total) by mouth 2 (two) times daily as needed for muscle spasms. 08/06/14  Yes Hope Orlene Och, NP  fluticasone (FLONASE) 50 MCG/ACT nasal spray Place 2 sprays into the nose daily.     Yes Historical Provider, MD  ibuprofen (ADVIL,MOTRIN) 600 MG tablet Take 1 tablet (600 mg total) by mouth every 6 (six) hours as needed. 10/26/14  Yes Burgess Amor, PA-C  lithium carbonate 300 MG capsule Take 300-600 mg by mouth 2 (two) times daily. Take one capsule ( ) every day in the morning, and take two capsules ( ) every day at bedtime   Yes Historical Provider, MD  propranolol (INNOPRAN XL) 80 MG 24 hr capsule Take 80 mg by mouth daily.    Yes Historical Provider, MD  QUEtiapine (SEROQUEL) 300 MG tablet Take 300 mg by mouth every evening.    Yes Historical Provider, MD  EPINEPHrine (EPI-PEN) 0.3 mg/0.3 mL SOAJ injection Inject 0.3 mLs (0.3 mg total) into the muscle once. 07/26/13   Donnetta Hutching, MD  famotidine (PEPCID) 20 MG tablet Take 1 tablet (20 mg total) by mouth 2 (two) times daily. Patient not taking: Reported on 08/26/2014 03/06/14   [name], NP  meclizine (ANTIVERT) 25  MG tablet Take 1 tablet (25 mg total) by mouth 2 (two) times daily as needed for dizziness. 10/30/14   Enid SkeensJoshua M Chayse Gracey, MD   BP 108/63 mmHg  Pulse 67  Temp(Src) 98 F (36.7 C) (Oral)  Resp 18  Ht 5\' 6"  (1.676 m)  Wt 140 lb (63.504 kg)  BMI 22.61 kg/m2  SpO2 99% Physical Exam  Constitutional: He is oriented to person, place, and time. He appears well-developed and well-nourished.  HENT:  Head: Normocephalic and atraumatic.  Eyes: Conjunctivae are normal. Right eye exhibits no  discharge. Left eye exhibits no discharge.  Neck: Normal range of motion. Neck supple. No tracheal deviation present.  Cardiovascular: Normal rate and regular rhythm.   Pulmonary/Chest: Effort normal and breath sounds normal.  Abdominal: Soft. He exhibits no distension. There is no tenderness. There is no guarding.  Musculoskeletal: He exhibits no edema.  Neurological: He is alert and oriented to person, place, and time. Gait normal. GCS eye subscore is 4. GCS verbal subscore is 5. GCS motor subscore is 6.  5+ strength in UE and LE with f/e at major joints. Sensation to palpation intact in UE and LE. CNs 2-12 grossly intact.  EOMFI.  PERRL.   Finger nose and coordination intact bilateral.   Visual fields intact to finger testing.   Skin: Skin is warm. No rash noted.  Psychiatric: He has a normal mood and affect.  Nursing note and vitals reviewed.   ED Course  Procedures (including critical care time) Labs Review Labs Reviewed - No data to display  Imaging Review No results found.   EKG Interpretation None      MDM   Final diagnoses:  Dizziness   Patient presents for recurrent visit for dizziness, blood work from the day prior reviewed no acute findings. Patient has normal neurologic exam, discussed supportive care and importance of getting a primary doctor. Meclizine when necessary if it helps. Results and differential diagnosis were discussed with the patient/parent/guardian. Close follow up outpatient was discussed, comfortable with the plan.   Medications - No data to display  Filed Vitals:   10/30/14 1752  BP: 108/63  Pulse: 67  Temp: 98 F (36.7 C)  TempSrc: Oral  Resp: 18  Height: 5\' 6"  (1.676 m)  Weight: 140 lb (63.504 kg)  SpO2: 99%    Final diagnoses:  Dizziness       Enid SkeensJoshua M Zuly Belkin, MD 10/30/14 1948

## 2014-10-30 NOTE — ED Notes (Signed)
Dizzy times 3 days.  Denies any pain.

## 2014-11-03 ENCOUNTER — Encounter (HOSPITAL_COMMUNITY): Payer: Self-pay | Admitting: *Deleted

## 2014-11-03 ENCOUNTER — Emergency Department (HOSPITAL_COMMUNITY)
Admission: EM | Admit: 2014-11-03 | Discharge: 2014-11-03 | Disposition: A | Discharge status: Home / Self Care | Payer: Medicaid Other | Attending: Emergency Medicine | Admitting: Emergency Medicine

## 2014-11-03 DIAGNOSIS — Z880 Allergy status to penicillin: Secondary | ICD-10-CM | POA: Diagnosis not present

## 2014-11-03 DIAGNOSIS — J45901 Unspecified asthma with (acute) exacerbation: Secondary | ICD-10-CM | POA: Diagnosis not present

## 2014-11-03 DIAGNOSIS — F909 Attention-deficit hyperactivity disorder, unspecified type: Secondary | ICD-10-CM | POA: Insufficient documentation

## 2014-11-03 DIAGNOSIS — K029 Dental caries, unspecified: Secondary | ICD-10-CM | POA: Diagnosis not present

## 2014-11-03 DIAGNOSIS — Z760 Encounter for issue of repeat prescription: Secondary | ICD-10-CM | POA: Diagnosis not present

## 2014-11-03 DIAGNOSIS — R079 Chest pain, unspecified: Secondary | ICD-10-CM | POA: Insufficient documentation

## 2014-11-03 DIAGNOSIS — R51 Headache: Secondary | ICD-10-CM | POA: Diagnosis not present

## 2014-11-03 DIAGNOSIS — Z79899 Other long term (current) drug therapy: Secondary | ICD-10-CM | POA: Diagnosis not present

## 2014-11-03 DIAGNOSIS — Z7951 Long term (current) use of inhaled steroids: Secondary | ICD-10-CM | POA: Insufficient documentation

## 2014-11-03 NOTE — ED Provider Notes (Signed)
CSN: [id number]     Arrival date & time 11/03/14  1520 History   First MD Initiated Contact with Patient 11/03/14 1539     Chief Complaint  Patient presents with  . Medication Refill     (Consider location/radiation/quality/duration/timing/severity/associated sxs/prior Treatment) The history is provided by the patient and a parent. No language interpreter was used.  MIRZA FESSEL is a 24 y/o M with PMHx of ADHD, back pain, asthma presenting to the ED with father, with request for Xanax medication refill. As per father, reported that patient has been on Xanax for the past 8 years. Reported that his primary care provider can longer prescribe medications secondary to losing his license recently last year. Father reported that he has been trying to get his son into a primary care provider's office and stated that he is unable to due so secondary to physician's not accepting new patients. Reported that patient is going through withdrawls due to not having his medications. When asked when patient finished his last dose of medications, father reported that it was 3 days ago. Father answers most of the questions - each time this provider asks a question that is directed towards the patient, the father answers the question - when patient goes to answer the question, the patient continuously looks that father as if he is looking for the answer. Patient reported that he started to experience left-sided chest discomfort an hour ago. Reported that he felt shortness of breath and hour ago. Reported that he started to experience diarrhea and hour ago. Patient also reported he started to have a headache described as aching sensation in hour ago.  Denied shakes, chills, tremors, diaphoresis, eating habit changes, nausea, vomiting, melena, hematochezia, hematuria, blurred vision, sudden loss of vision, neck pain, neck stiffness. PCP none  Past Medical History  Diagnosis Date  . ADHD (attention deficit hyperactivity  disorder)   . Back pain   . Shoulder pain   . Asthma    Past Surgical History  Procedure Laterality Date  . Upper gi endoscopy     Family History  Problem Relation Age of Onset  . Stroke Father   . Seizures Mother   . Seizures Brother    History  Substance Use Topics  . Smoking status: Never Smoker   . Smokeless tobacco: Never Used  . Alcohol Use: No    Review of Systems  Constitutional: Negative for fever and chills.  Respiratory: Positive for shortness of breath.   Cardiovascular: Positive for chest pain.  Gastrointestinal: Negative for nausea, vomiting, abdominal pain, diarrhea, constipation and blood in stool.  Musculoskeletal: Negative for back pain, neck pain and neck stiffness.  Neurological: Positive for headaches. Negative for tremors.      Allergies  Codeine; Lactose intolerance (gi); Percocet; and Penicillins  Home Medications   Prior to Admission medications   Medication Sig Start Date End Date Taking? Authorizing Provider  ALPRAZolam Prudy Feeler) 1 MG tablet Take 1 tablet twice daily for 7 days, then take 1/2 tablet twice daily for 14 days, then 1/2 tablet once daily for 7 days. Patient taking differently: Take 1 mg by mouth 2 (two) times daily.  10/28/14  Yes Raynelle Fanning Idol, PA-C  fluticasone (FLONASE) 50 MCG/ACT nasal spray Place 2 sprays into the nose daily.     Yes Historical Provider, MD  ibuprofen (ADVIL,MOTRIN) 600 MG tablet Take 1 tablet (600 mg total) by mouth every 6 (six) hours as needed. 10/26/14  Yes Burgess Amor, PA-C  lithium carbonate 300  MG capsule Take 300-600 mg by mouth 2 (two) times daily. Take one capsule ( ) every day in the morning, and take two capsules ( ) every day at bedtime   Yes Historical Provider, MD  propranolol (INNOPRAN XL) 80 MG 24 hr capsule Take 80 mg by mouth daily.    Yes Historical Provider, MD  QUEtiapine (SEROQUEL) 300 MG tablet Take 300 mg by mouth every evening.    Yes Historical Provider, MD  cyclobenzaprine  (FLEXERIL) 10 MG tablet Take 0.5 tablets (5 mg total) by mouth 2 (two) times daily as needed for muscle spasms. 08/06/14   Hope Orlene Och, NP  EPINEPHrine (EPI-PEN) 0.3 mg/0.3 mL SOAJ injection Inject 0.3 mLs (0.3 mg total) into the muscle once. 07/26/13   Donnetta Hutching, MD  famotidine (PEPCID) 20 MG tablet Take 1 tablet (20 mg total) by mouth 2 (two) times daily. Patient not taking: Reported on 08/26/2014 03/06/14   [name], NP  meclizine (ANTIVERT) 25 MG tablet Take 1 tablet (25 mg total) by mouth 2 (two) times daily as needed for dizziness. 10/30/14   Enid Skeens, MD   BP 116/67 mmHg  Pulse 60  Temp(Src) 98.5 F (36.9 C) (Oral)  Resp 18  Ht  (1.702 m)  Wt 140 lb (63.504 kg)  BMI 21.92 kg/m2  SpO2 100% Physical Exam  Constitutional: He is oriented to person, place, and time. He appears well-developed and well-nourished. No distress.  HENT:  Head: Normocephalic and atraumatic.  Mouth/Throat: Oropharynx is clear and moist. No oropharyngeal exudate.  Poor dentition with numerous dental caries and decaying of teeth  Eyes: Conjunctivae and EOM are normal. Pupils are equal, round, and reactive to light. Right eye exhibits no discharge. Left eye exhibits no discharge.  Neck: Normal range of motion. Neck supple. No tracheal deviation present.  Negative neck stiffness Negative nuchal rigidity  Negative cervical lymphadenopathy   Cardiovascular: Normal rate, regular rhythm and normal heart sounds.  Exam reveals no friction rub.   No murmur heard. Pulmonary/Chest: Effort normal and breath sounds normal. No respiratory distress. He has no wheezes. He has no rales.  Abdominal: Soft. Bowel sounds are normal. He exhibits no distension. There is no tenderness. There is no rebound and no guarding.  Musculoskeletal: Normal range of motion.  Full ROM to upper and lower extremities without difficulty noted, negative ataxia noted.  Lymphadenopathy:    He has no cervical adenopathy.  Neurological:  He is alert and oriented to person, place, and time. No cranial nerve deficit. He exhibits normal muscle tone. Coordination normal.  Cranial nerves III-XII grossly intact Strength 5+/5+ to upper and lower extremities bilaterally with resistance applied, equal distribution noted Negative facial droop Negative slurred speech  Negative aphasia Patient follows commands well  Patient responds to questions appropriately Negative shakes or tremors noted on examination   Skin: Skin is warm and dry. He is not diaphoretic.  Comedones noted on skin   Psychiatric: Thought content normal.  Poor eye contact  Patient appears to continuously look at father when questions asked  Nursing note and vitals reviewed.   ED Course  Procedures (including critical care time) Labs Review Labs Reviewed - No data to display  Imaging Review No results found.   EKG Interpretation None      MDM   Final diagnoses:  Medication refill    Medications - No data to display  Filed Vitals:   11/03/14 1531  BP: 116/67  Pulse: 60  Temp: 98.5 F (36.9  C)  TempSrc: Oral  Resp: 18  Height: 5\' 7"  (1.702 m)  Weight: 140 lb (63.504 kg)  SpO2: 100%   This provider reviewed patient's chart. Patient was seen and assessed in ED setting on 10/13/2014 regarding refill of Xanax. Patient was discharged with approximately 40 tablets of Xanax and was instructed to perform tapering dose. As per note from physician assistant, reported that after this prescription was complete patient is not to receive any more Xanax for this will result in proper tapering of the medication. Patient presenting to the emergency department on 10/31/2014 with family member requesting Xanax refill. At this point in time patient was denied refill. Family member was concerned for withdrawal-no sign of withdrawal noted on 10/31/2014-patient was discharged with recommendation to [facility].  Patient presenting again to the ED with father  with request for Xanax prescription. This provider had a long discussion with the father and patient that he is not going through withdrawls - patient appears well, laying comfortably upright in bed. Negative tremors or shakes. Skin warm and dry. Vitals stable. Father reported that he needs his Xanax. Patient seen and assessed by attending physician, Dr. Billey Chang. [place] - once physician left the room, patient and father eloped. This provider went to go talk with the family again - family was not in room and left the ED. Patient eloped.   Raymon MuttonMarissa Reily Treloar, PA-C 11/03/14 [address]1705  Demitra Danley, PA-C 11/03/14 1709  Gerhard Munchobert Lockwood, MD 11/03/14 2059

## 2014-11-03 NOTE — ED Notes (Signed)
Pt and pt's father left just as EDP left room, EDP aware of them leaving

## 2014-11-03 NOTE — ED Notes (Signed)
Father requesting xanax refill for son. Chronic user. Patient has care plan in place

## 2014-11-03 NOTE — ED Notes (Signed)
Pt left without receiving d/c instructions.

## 2014-11-04 ENCOUNTER — Encounter (HOSPITAL_COMMUNITY): Payer: Self-pay | Admitting: *Deleted

## 2014-11-04 ENCOUNTER — Emergency Department (HOSPITAL_COMMUNITY)
Admission: EM | Admit: 2014-11-04 | Discharge: 2014-11-04 | Disposition: A | Discharge status: Home / Self Care | Payer: Medicaid Other | Attending: Emergency Medicine | Admitting: Emergency Medicine

## 2014-11-04 ENCOUNTER — Emergency Department (HOSPITAL_COMMUNITY): Payer: Medicaid Other

## 2014-11-04 DIAGNOSIS — Z8659 Personal history of other mental and behavioral disorders: Secondary | ICD-10-CM | POA: Diagnosis not present

## 2014-11-04 DIAGNOSIS — M549 Dorsalgia, unspecified: Secondary | ICD-10-CM | POA: Diagnosis not present

## 2014-11-04 DIAGNOSIS — Z79899 Other long term (current) drug therapy: Secondary | ICD-10-CM | POA: Insufficient documentation

## 2014-11-04 DIAGNOSIS — J45909 Unspecified asthma, uncomplicated: Secondary | ICD-10-CM | POA: Diagnosis not present

## 2014-11-04 DIAGNOSIS — Z7951 Long term (current) use of inhaled steroids: Secondary | ICD-10-CM | POA: Diagnosis not present

## 2014-11-04 DIAGNOSIS — Z880 Allergy status to penicillin: Secondary | ICD-10-CM | POA: Diagnosis not present

## 2014-11-04 DIAGNOSIS — R109 Unspecified abdominal pain: Secondary | ICD-10-CM | POA: Insufficient documentation

## 2014-11-04 DIAGNOSIS — R197 Diarrhea, unspecified: Secondary | ICD-10-CM | POA: Diagnosis not present

## 2014-11-04 LAB — BASIC METABOLIC PANEL
Anion gap: 6 (ref 5–15)
BUN: 15 mg/dL (ref 6–23)
CALCIUM: 9.8 mg/dL (ref 8.4–10.5)
CO2: 26 mmol/L (ref 19–32)
Chloride: 107 mEq/L (ref 96–112)
Creatinine, Ser: 0.84 mg/dL (ref 0.50–1.35)
GFR calc Af Amer: >90 mL/min (ref >90)
GFR calc non Af Amer: >90 mL/min (ref >90)
Glucose, Bld: 95 mg/dL (ref 70–99)
Potassium: 4 mmol/L (ref 3.5–5.1)
SODIUM: 139 mmol/L (ref 135–145)

## 2014-11-04 LAB — CBC WITH DIFFERENTIAL/PLATELET
Basophils Absolute: 0 10*3/uL (ref 0.0–0.1)
Basophils Relative: 0 % (ref 0–1)
Eosinophils Absolute: 0.8 10*3/uL — ABNORMAL HIGH (ref 0.0–0.7)
Eosinophils Relative: 8 % — ABNORMAL HIGH (ref 0–5)
HCT: 33.8 % — ABNORMAL LOW (ref 39.0–52.0)
Hemoglobin: 10.9 g/dL — ABNORMAL LOW (ref 13.0–17.0)
Lymphocytes Relative: 21 % (ref 12–46)
Lymphs Abs: 2.2 10*3/uL (ref 0.7–4.0)
MCH: 20.6 pg — AB (ref 26.0–34.0)
MCHC: 32.2 g/dL (ref 30.0–36.0)
MCV: 63.8 fL — ABNORMAL LOW (ref 78.0–100.0)
MONO ABS: 0.5 10*3/uL (ref 0.1–1.0)
Monocytes Relative: 4 % (ref 3–12)
NEUTROS ABS: 6.9 10*3/uL (ref 1.7–7.7)
Neutrophils Relative %: 66 % (ref 43–77)
Platelets: 227 10*3/uL (ref 150–400)
RBC: 5.3 MIL/uL (ref 4.22–5.81)
RDW: 15.3 % (ref 11.5–15.5)
WBC: 10.4 10*3/uL (ref 4.0–10.5)

## 2014-11-04 MED ORDER — LOPERAMIDE HCL 2 MG PO TABS: 2.0000 mg | ORAL_TABLET | Freq: Four times a day (QID) | ORAL | Status: DC | PRN

## 2014-11-04 NOTE — Discharge Instructions (Signed)
Lab workup here without any significant findings. Take the Imodium right ear as needed for the diarrhea. Return for any new or worse symptoms. X-rays of the abdomen without any significant abnormalities.

## 2014-11-04 NOTE — ED Provider Notes (Signed)
CSN: [id number]     Arrival date & time 11/04/14  1605 History  This chart was scribed for Vanetta Mulders, MD by Milly Jakob, ED Scribe. The patient was seen in room APA08/APA08. Patient's care was started at 5:10 PM.    Chief Complaint  Patient presents with  . Diarrhea   Patient is a 24 y.o. male presenting with diarrhea. The history is provided by the patient. No language interpreter was used.  Diarrhea Quality:  Watery Severity:  Moderate Onset quality:  Sudden Number of episodes:  22 Duration:  1 day Timing:  Intermittent Progression:  Unchanged Relieved by:  Nothing Worsened by:  Nothing tried Ineffective treatments:  None tried Associated symptoms: abdominal pain   Associated symptoms: no chills, no fever, no headaches and no vomiting    HPI Comments: Eugene Hicks is a 24 y.o. male who presents to the Emergency Department complaining of more than 20 episodes of diarrhea this morning. He reports associated abdominal pain that he rates as a 5/10. He denies hematochezia, nausea, and vomiting.   Past Medical History  Diagnosis Date  . ADHD (attention deficit hyperactivity disorder)   . Back pain   . Shoulder pain   . Asthma    Past Surgical History  Procedure Laterality Date  . Upper gi endoscopy     Family History  Problem Relation Age of Onset  . Stroke Father   . Seizures Mother   . Seizures Brother    History  Substance Use Topics  . Smoking status: Never Smoker   . Smokeless tobacco: Never Used  . Alcohol Use: No    Review of Systems  Constitutional: Negative for fever and chills.  HENT: Negative for rhinorrhea and sore throat.   Eyes: Negative for visual disturbance.  Respiratory: Negative for cough and shortness of breath.   Cardiovascular: Negative for chest pain and leg swelling.  Gastrointestinal: Positive for abdominal pain and diarrhea. Negative for nausea and vomiting.  Genitourinary: Negative for dysuria.  Musculoskeletal: Positive for  back pain (baseline). Negative for neck pain.  Skin: Negative for rash.  Neurological: Negative for dizziness, light-headedness and headaches.  Hematological: Does not bruise/bleed easily.  Psychiatric/Behavioral: Negative for confusion.    Allergies  Codeine; Lactose intolerance (gi); Percocet; and Penicillins  Home Medications   Prior to Admission medications   Medication Sig Start Date End Date Taking? Authorizing Provider  ALPRAZolam Prudy Feeler) 1 MG tablet Take 1 tablet twice daily for 7 days, then take 1/2 tablet twice daily for 14 days, then 1/2 tablet once daily for 7 days. Patient taking differently: Take 1 mg by mouth 2 (two) times daily.  10/28/14  Yes Raynelle Fanning Idol, PA-C  cyclobenzaprine (FLEXERIL) 10 MG tablet Take 0.5 tablets (5 mg total) by mouth 2 (two) times daily as needed for muscle spasms. 08/06/14  Yes Hope Orlene Och, NP  fluticasone (FLONASE) 50 MCG/ACT nasal spray Place 2 sprays into the nose daily.     Yes Historical Provider, MD  lithium carbonate 300 MG capsule Take 300-600 mg by mouth 2 (two) times daily. Take one capsule ( ) every day in the morning, and take two capsules ( ) every day at bedtime   Yes Historical Provider, MD  meclizine (ANTIVERT) 25 MG tablet Take 1 tablet (25 mg total) by mouth 2 (two) times daily as needed for dizziness. 10/30/14  Yes Enid Skeens, MD  propranolol (INNOPRAN XL) 80 MG 24 hr capsule Take 80 mg by mouth daily.    Yes  Historical Provider, MD  QUEtiapine (SEROQUEL) 300 MG tablet Take 300 mg by mouth every evening.    Yes Historical Provider, MD  EPINEPHrine (EPI-PEN) 0.3 mg/0.3 mL SOAJ injection Inject 0.3 mLs (0.3 mg total) into the muscle once. 07/26/13   Donnetta HutchingBrian Cook, MD  famotidine (PEPCID) 20 MG tablet Take 1 tablet (20 mg total) by mouth 2 (two) times daily. Patient not taking: Reported on 08/26/2014 03/06/14   Janne NapoleonHope M Neese, NP  ibuprofen (ADVIL,MOTRIN) 600 MG tablet Take 1 tablet (600 mg total) by mouth every 6 (six) hours as  needed. Patient not taking: Reported on 11/04/2014 10/26/14   Burgess AmorJulie Idol, PA-C  loperamide (IMODIUM A-D) 2 MG tablet Take 1 tablet (2 mg total) by mouth 4 (four) times daily as needed for diarrhea or loose stools. 11/04/14   Vanetta MuldersScott Clifford Benninger, MD   Triage Vitals: BP 113/58 mmHg  Pulse 60  Temp(Src) 97.9 F (36.6 C) (Oral)  Resp 18  Ht 5\' 8"  (1.727 m)  Wt 140 lb (63.504 kg)  BMI 21.29 kg/m2  SpO2 99% Physical Exam  Constitutional: He is oriented to person, place, and time. He appears well-developed and well-nourished. No distress.  HENT:  Head: Normocephalic and atraumatic.  Mouth/Throat: Oropharynx is clear and moist.  Eyes: Conjunctivae and EOM are normal. Pupils are equal, round, and reactive to light. No scleral icterus.  Neck: Neck supple. No tracheal deviation present.  Cardiovascular: Normal rate, regular rhythm and normal heart sounds.   No murmur heard. Pulmonary/Chest: Effort normal. No respiratory distress.  Abdominal: Soft. Bowel sounds are normal. He exhibits no distension. There is no tenderness.  Musculoskeletal: Normal range of motion. He exhibits no edema.  Neurological: He is alert and oriented to person, place, and time.  Skin: Skin is warm and dry.  Psychiatric: He has a normal mood and affect. His behavior is normal.  Nursing note and vitals reviewed.   ED Course  Procedures (including critical care time) DIAGNOSTIC STUDIES: Oxygen Saturation is 99% on room air, normal by my interpretation.    COORDINATION OF CARE: 5:13 PM-Discussed treatment plan which includes abdominal X-ray and lab work with pt at bedside and pt agreed to plan.   Results for orders placed or performed during the hospital encounter of 11/04/14  CBC with Differential  Result Value Ref Range   WBC 10.4 4.0 - 10.5 K/uL   RBC 5.30 4.22 - 5.81 MIL/uL   Hemoglobin 10.9 (L) 13.0 - 17.0 g/dL   HCT 16.133.8 (L) 09.639.0 - 04.552.0 %   MCV 63.8 (L) 78.0 - 100.0 fL   MCH 20.6 (L) 26.0 - 34.0 pg   MCHC 32.2  30.0 - 36.0 g/dL   RDW 40.915.3 81.111.5 - 91.415.5 %   Platelets 227 150 - 400 K/uL   Neutrophils Relative % 66 43 - 77 %   Neutro Abs 6.9 1.7 - 7.7 K/uL   Lymphocytes Relative 21 12 - 46 %   Lymphs Abs 2.2 0.7 - 4.0 K/uL   Monocytes Relative 4 3 - 12 %   Monocytes Absolute 0.5 0.1 - 1.0 K/uL   Eosinophils Relative 8 (H) 0 - 5 %   Eosinophils Absolute 0.8 (H) 0.0 - 0.7 K/uL   Basophils Relative 0 0 - 1 %   Basophils Absolute 0.0 0.0 - 0.1 K/uL  Basic metabolic panel  Result Value Ref Range   Sodium 139 135 - 145 mmol/L   Potassium 4.0 3.5 - 5.1 mmol/L   Chloride 107 96 - 112 mEq/L  CO2 26 19 - 32 mmol/L   Glucose, Bld 95 70 - 99 mg/dL   BUN 15 6 - 23 mg/dL   Creatinine, Ser 9.60 0.50 - 1.35 mg/dL   Calcium 9.8 8.4 - 45.4 mg/dL   GFR calc non Af Amer >90 >90 mL/min   GFR calc Af Amer >90 >90 mL/min   Anion gap 6 5 - 15   Dg Abd Acute W/chest  11/04/2014   CLINICAL DATA:  Abdominal pain and diarrhea.  EXAM: ACUTE ABDOMEN SERIES (ABDOMEN 2 VIEW & CHEST 1 VIEW)  COMPARISON:  Acute abdominal series 10/12/2014  FINDINGS: Heart size is normal. The lungs are clear. The visualized soft tissues to pneumothorax are unremarkable.  There is mild gaseous distension the stomach, similar to the prior study. The bowel gas pattern is otherwise unremarkable. There is no evidence for obstruction or free air. The axial skeleton is within normal limits.  IMPRESSION: No acute abnormality of the chest or abdomen.   Electronically Signed   By: Gennette Pac M.D.   On: 11/04/2014 18:10   Dg Abd Acute W/chest  10/12/2014   CLINICAL DATA:  Generalized abdominal pain and diarrhea for 3 day  EXAM: ACUTE ABDOMEN SERIES (ABDOMEN 2 VIEW & CHEST 1 VIEW)  COMPARISON:  08/02/2014  FINDINGS: Normal heart size.  Clear lungs.  No free intraperitoneal gas. No disproportionate dilatation of bowel. The stomach is distended. No obvious pneumatosis.  IMPRESSION: No active cardiopulmonary disease  Gastric distension.   Electronically  Signed   By: Maryclare Bean M.D.   On: 10/12/2014 18:58    Medications - No data to display   EKG Interpretation None      MDM   Final diagnoses:  Diarrhea    X-rays of the abdomen without evidence of any bowel obstruction. Patient not seen with any diarrhea here. But historically states he's had a lot of loose bowel movements. Will treat with Imodium right ear prescription provided. Patient's labs electrolytes and complete blood count without any significant abnormalities. No significant leukocytosis no fevers. No evidence of any, cocaine factors associated with the diarrhea.    I personally performed the services described in this documentation, which was scribed in my presence. The recorded information has been reviewed and is accurate.     Vanetta Mulders, MD 11/04/14 [phone]

## 2014-11-04 NOTE — ED Notes (Signed)
Pt witnessed walking out of department saying "I have to go home, I can't lay in here with my stomach hurting all night".

## 2014-11-04 NOTE — ED Notes (Addendum)
Pt states diarrhea began this morning. States "23" episodes of diarrhea today with last episode occuring one hour ago. When asked about pain, pt pointed to umbilical region.Patient has a careplan.

## 2014-11-05 ENCOUNTER — Encounter (HOSPITAL_COMMUNITY): Payer: Self-pay | Admitting: Emergency Medicine

## 2014-11-05 ENCOUNTER — Emergency Department (HOSPITAL_COMMUNITY)
Admission: EM | Admit: 2014-11-05 | Discharge: 2014-11-05 | Discharge status: Against Medical Advice | Payer: Medicaid Other | Attending: Emergency Medicine | Admitting: Emergency Medicine

## 2014-11-05 DIAGNOSIS — R197 Diarrhea, unspecified: Secondary | ICD-10-CM | POA: Insufficient documentation

## 2014-11-05 DIAGNOSIS — Z8659 Personal history of other mental and behavioral disorders: Secondary | ICD-10-CM | POA: Diagnosis not present

## 2014-11-05 DIAGNOSIS — Z79899 Other long term (current) drug therapy: Secondary | ICD-10-CM | POA: Diagnosis not present

## 2014-11-05 DIAGNOSIS — J45909 Unspecified asthma, uncomplicated: Secondary | ICD-10-CM | POA: Diagnosis not present

## 2014-11-05 DIAGNOSIS — Z7951 Long term (current) use of inhaled steroids: Secondary | ICD-10-CM | POA: Diagnosis not present

## 2014-11-05 DIAGNOSIS — R1110 Vomiting, unspecified: Secondary | ICD-10-CM | POA: Diagnosis present

## 2014-11-05 DIAGNOSIS — Z880 Allergy status to penicillin: Secondary | ICD-10-CM | POA: Diagnosis not present

## 2014-11-05 MED ORDER — LOPERAMIDE HCL 2 MG PO CAPS
4.0000 mg | ORAL_CAPSULE | Freq: Once | ORAL | Status: DC
Start: 2014-11-05 — End: 2014-11-05

## 2014-11-05 MED ORDER — ONDANSETRON 4 MG PO TBDP: 4.0000 mg | ORAL_TABLET | Freq: Once | ORAL | Status: DC

## 2014-11-05 NOTE — ED Provider Notes (Signed)
CSN: [id number]     Arrival date & time 11/05/14  1632 History   First MD Initiated Contact with Patient 11/05/14 1814     Chief Complaint  Patient presents with  . Emesis     (Consider location/radiation/quality/duration/timing/severity/associated sxs/prior Treatment) Patient is a 24 y.o. male presenting with diarrhea. The history is provided by the patient (the pt complains of diarrhea).  Diarrhea Quality:  Watery Severity:  Mild Onset quality:  Sudden Timing:  Intermittent Progression:  Unchanged Relieved by:  Nothing Worsened by:  Nothing tried Associated symptoms: no abdominal pain and no headaches     Past Medical History  Diagnosis Date  . ADHD (attention deficit hyperactivity disorder)   . Back pain   . Shoulder pain   . Asthma    Past Surgical History  Procedure Laterality Date  . Upper gi endoscopy     Family History  Problem Relation Age of Onset  . Stroke Father   . Seizures Mother   . Seizures Brother    History  Substance Use Topics  . Smoking status: Never Smoker   . Smokeless tobacco: Never Used  . Alcohol Use: No    Review of Systems  Constitutional: Negative for appetite change and fatigue.  HENT: Negative for congestion, ear discharge and sinus pressure.   Eyes: Negative for discharge.  Respiratory: Negative for cough.   Cardiovascular: Negative for chest pain.  Gastrointestinal: Positive for diarrhea. Negative for abdominal pain.  Genitourinary: Negative for frequency and hematuria.  Musculoskeletal: Negative for back pain.  Skin: Negative for rash.  Neurological: Negative for seizures and headaches.  Psychiatric/Behavioral: Negative for hallucinations.      Allergies  Codeine; Lactose intolerance (gi); Percocet; and Penicillins  Home Medications   Prior to Admission medications   Medication Sig Start Date End Date Taking? Authorizing Provider  ALPRAZolam Prudy Feeler(XANAX) 1 MG tablet Take 1 tablet twice daily for 7 days, then take 1/2  tablet twice daily for 14 days, then 1/2 tablet once daily for 7 days. Patient taking differently: Take 1 mg by mouth 2 (two) times daily.  10/28/14  Yes Raynelle FanningJulie Idol, PA-C  cyclobenzaprine (FLEXERIL) 10 MG tablet Take 0.5 tablets (5 mg total) by mouth 2 (two) times daily as needed for muscle spasms. 08/06/14  Yes Hope Orlene OchM Neese, NP  fluticasone (FLONASE) 50 MCG/ACT nasal spray Place 2 sprays into the nose daily.     Yes Historical Provider, MD  lithium carbonate 300 MG capsule Take 300-600 mg by mouth 2 (two) times daily. Take one capsule (300mg ) every day in the morning, and take two capsules (600mg ) every day at bedtime   Yes Historical Provider, MD  meclizine (ANTIVERT) 25 MG tablet Take 1 tablet (25 mg total) by mouth 2 (two) times daily as needed for dizziness. 10/30/14  Yes Enid SkeensJoshua M Zavitz, MD  propranolol (INNOPRAN XL) 80 MG 24 hr capsule Take 80 mg by mouth daily.    Yes Historical Provider, MD  QUEtiapine (SEROQUEL) 300 MG tablet Take 300 mg by mouth every evening.    Yes Historical Provider, MD  EPINEPHrine (EPI-PEN) 0.3 mg/0.3 mL SOAJ injection Inject 0.3 mLs (0.3 mg total) into the muscle once. 07/26/13   Donnetta HutchingBrian Cook, MD  famotidine (PEPCID) 20 MG tablet Take 1 tablet (20 mg total) by mouth 2 (two) times daily. Patient not taking: Reported on 08/26/2014 03/06/14   Janne NapoleonHope M Neese, NP  ibuprofen (ADVIL,MOTRIN) 600 MG tablet Take 1 tablet (600 mg total) by mouth every 6 (six) hours  as needed. Patient not taking: Reported on 11/04/2014 10/26/14   Burgess Amor, PA-C  loperamide (IMODIUM A-D) 2 MG tablet Take 1 tablet (2 mg total) by mouth 4 (four) times daily as needed for diarrhea or loose stools. 11/04/14   Vanetta Mulders, MD   BP 113/64 mmHg  Pulse 86  Temp(Src) 97.9 F (36.6 C) (Oral)  Resp 18  Ht  (1.727 m)  Wt 133 lb (60.328 kg)  BMI 20.23 kg/m2  SpO2 100% Physical Exam  Constitutional: He is oriented to person, place, and time. He appears well-developed.  HENT:  Head: Normocephalic.   Eyes: Conjunctivae and EOM are normal. No scleral icterus.  Neck: Neck supple. No thyromegaly present.  Cardiovascular: Normal rate and regular rhythm.  Exam reveals no gallop and no friction rub.   No murmur heard. Pulmonary/Chest: No stridor. He has no wheezes. He has no rales. He exhibits no tenderness.  Abdominal: He exhibits no distension. There is no tenderness. There is no rebound.  Musculoskeletal: Normal range of motion. He exhibits no edema.  Lymphadenopathy:    He has no cervical adenopathy.  Neurological: He is oriented to person, place, and time. He exhibits normal muscle tone. Coordination normal.  Skin: No rash noted. No erythema.  Psychiatric: He has a normal mood and affect. His behavior is normal.    ED Course  Procedures (including critical care time) Labs Review Labs Reviewed - No data to display  Imaging Review Dg Abd Acute W/chest  11/04/2014   CLINICAL DATA:  Abdominal pain and diarrhea.  EXAM: ACUTE ABDOMEN SERIES (ABDOMEN 2 VIEW & CHEST 1 VIEW)  COMPARISON:  Acute abdominal series 10/12/2014  FINDINGS: Heart size is normal. The lungs are clear. The visualized soft tissues to pneumothorax are unremarkable.  There is mild gaseous distension the stomach, similar to the prior study. The bowel gas pattern is otherwise unremarkable. There is no evidence for obstruction or free air. The axial skeleton is within normal limits.  IMPRESSION: No acute abnormality of the chest or abdomen.   Electronically Signed   By: Gennette Pac M.D.   On: 11/04/2014 18:10     EKG Interpretation None      MDM   Final diagnoses:  Diarrhea    The pts father wanted the pt to get a refill on his narcotics.   I stated that we will tx his stomach problems,  But not refill the narcotics.   The father told the pt that they would leave.   Pt left ama    Benny Lennert, MD 11/05/14 [phone]

## 2014-11-05 NOTE — ED Notes (Signed)
Pt not in room, unable to locate patient. Per Chip BoerVicki ED tech patient and his visitor left around [phone].

## 2014-11-05 NOTE — ED Notes (Signed)
Pt has been without xanax for 3 days, pt states vomiting today looked like coffee ground, diarrhea

## 2014-11-06 ENCOUNTER — Emergency Department (HOSPITAL_COMMUNITY)
Admission: EM | Admit: 2014-11-06 | Discharge: 2014-11-06 | Disposition: A | Discharge status: Home / Self Care | Payer: Medicaid Other | Attending: Emergency Medicine | Admitting: Emergency Medicine

## 2014-11-06 ENCOUNTER — Encounter (HOSPITAL_COMMUNITY): Payer: Self-pay | Admitting: Emergency Medicine

## 2014-11-06 DIAGNOSIS — Z880 Allergy status to penicillin: Secondary | ICD-10-CM | POA: Insufficient documentation

## 2014-11-06 DIAGNOSIS — Z79899 Other long term (current) drug therapy: Secondary | ICD-10-CM | POA: Insufficient documentation

## 2014-11-06 DIAGNOSIS — Z760 Encounter for issue of repeat prescription: Secondary | ICD-10-CM | POA: Insufficient documentation

## 2014-11-06 DIAGNOSIS — Z7951 Long term (current) use of inhaled steroids: Secondary | ICD-10-CM | POA: Diagnosis not present

## 2014-11-06 DIAGNOSIS — Z8739 Personal history of other diseases of the musculoskeletal system and connective tissue: Secondary | ICD-10-CM | POA: Diagnosis not present

## 2014-11-06 DIAGNOSIS — J45909 Unspecified asthma, uncomplicated: Secondary | ICD-10-CM | POA: Insufficient documentation

## 2014-11-06 DIAGNOSIS — Z8659 Personal history of other mental and behavioral disorders: Secondary | ICD-10-CM | POA: Diagnosis not present

## 2014-11-06 NOTE — ED Notes (Signed)
Patient here for medication refill of Xanax. Patient seen here last night for same reason per father and was told to get dressed and go with no prescription. Per father called hospital today and was [facility]. Per father patient has appointment with psychiatrist on January 28th in [place].

## 2014-11-07 NOTE — ED Provider Notes (Signed)
CSN: [id number]     Arrival date & time 11/06/14  1801 History   First MD Initiated Contact with Patient 11/06/14 1858     Chief Complaint  Patient presents with  . Medication Refill     (Consider location/radiation/quality/duration/timing/severity/associated sxs/prior Treatment) HPI  Eugene Hicks is a 24 y.o. male who presents to the Emergency Department with his father requesting refill for his xanax's.  Father states that he was contacted at home today by one of the nurses and advised to return to ED for recheck. Patient was seen here yesterday for diarrhea, but left AMA.   Father of the patient states he took his last xanax three days ago.  Denies vomiting, fever, or seizures.  Past Medical History  Diagnosis Date  . ADHD (attention deficit hyperactivity disorder)   . Back pain   . Shoulder pain   . Asthma    Past Surgical History  Procedure Laterality Date  . Upper gi endoscopy     Family History  Problem Relation Age of Onset  . Stroke Father   . Seizures Mother   . Seizures Brother    History  Substance Use Topics  . Smoking status: Never Smoker   . Smokeless tobacco: Never Used  . Alcohol Use: No    Review of Systems  Constitutional: Negative for fever, chills and fatigue.  HENT: Negative for sore throat and trouble swallowing.   Respiratory: Negative for shortness of breath.   Cardiovascular: Negative for chest pain and palpitations.  Gastrointestinal: Negative for vomiting and abdominal pain.  Genitourinary: Negative for dysuria, hematuria and flank pain.  Musculoskeletal: Negative for myalgias, neck pain and neck stiffness.  Skin: Negative for rash.  Neurological: Negative for dizziness, seizures, weakness and headaches.  Hematological: Does not bruise/bleed easily.      Allergies  Codeine; Lactose intolerance (gi); Percocet; and Penicillins  Home Medications   Prior to Admission medications   Medication Sig Start Date End Date Taking?  Authorizing Provider  ALPRAZolam Prudy Feeler) 1 MG tablet Take 1 tablet twice daily for 7 days, then take 1/2 tablet twice daily for 14 days, then 1/2 tablet once daily for 7 days. Patient taking differently: Take 1 mg by mouth 2 (two) times daily.  10/28/14  Yes Raynelle Fanning Idol, PA-C  cyclobenzaprine (FLEXERIL) 10 MG tablet Take 0.5 tablets (5 mg total) by mouth 2 (two) times daily as needed for muscle spasms. 08/06/14  Yes Hope Orlene Och, NP  fluticasone (FLONASE) 50 MCG/ACT nasal spray Place 2 sprays into the nose daily.     Yes Historical Provider, MD  lithium carbonate 300 MG capsule Take 300-600 mg by mouth 2 (two) times daily. Take one capsule ( ) every day in the morning, and take two capsules ( ) every day at bedtime   Yes Historical Provider, MD  loperamide (IMODIUM A-D) 2 MG tablet Take 1 tablet (2 mg total) by mouth 4 (four) times daily as needed for diarrhea or loose stools. 11/04/14  Yes Vanetta Mulders, MD  meclizine (ANTIVERT) 25 MG tablet Take 1 tablet (25 mg total) by mouth 2 (two) times daily as needed for dizziness. 10/30/14  Yes Enid Skeens, MD  propranolol (INNOPRAN XL) 80 MG 24 hr capsule Take 80 mg by mouth daily.    Yes Historical Provider, MD  QUEtiapine (SEROQUEL) 300 MG tablet Take 300 mg by mouth every evening.    Yes Historical Provider, MD  EPINEPHrine (EPI-PEN) 0.3 mg/0.3 mL SOAJ injection Inject 0.3 mLs (0.3 mg total) into  the muscle once. 07/26/13   Donnetta HutchingBrian Cook, MD  famotidine (PEPCID) 20 MG tablet Take 1 tablet (20 mg total) by mouth 2 (two) times daily. Patient not taking: Reported on 08/26/2014 03/06/14   Janne NapoleonHope M Neese, NP  ibuprofen (ADVIL,MOTRIN) 600 MG tablet Take 1 tablet (600 mg total) by mouth every 6 (six) hours as needed. Patient not taking: Reported on 11/04/2014 10/26/14   Burgess AmorJulie Idol, PA-C   BP 103/56 mmHg  Pulse 51  Temp(Src) 97.6 F (36.4 C) (Oral)  Resp 17  Ht 5\' 8"  (1.727 m)  Wt 131 lb 3.2 oz (59.512 kg)  BMI 19.95 kg/m2  SpO2 100% Physical Exam   Constitutional: He is oriented to person, place, and time. He appears well-developed and well-nourished. No distress.  HENT:  Head: Normocephalic and atraumatic.  Eyes: EOM are normal. Pupils are equal, round, and reactive to light.  Neck: Normal range of motion. Neck supple.  Cardiovascular: Normal rate, regular rhythm, normal heart sounds and intact distal pulses.   No murmur heard. Pulmonary/Chest: Effort normal and breath sounds normal. No respiratory distress.  Abdominal: Soft. He exhibits no distension. There is no tenderness. There is no rebound and no guarding.  Musculoskeletal: Normal range of motion.  Neurological: He is alert and oriented to person, place, and time. He exhibits normal muscle tone. Coordination normal.  Skin: Skin is warm and dry. No rash noted.  Psychiatric: He has a normal mood and affect.  Nursing note and vitals reviewed.   ED Course  Procedures (including critical care time) Labs Review Labs Reviewed - No data to display  Imaging Review No results found.   EKG Interpretation None      MDM   Final diagnoses:  Encounter for medication refill    Pt is calm, vitals stable.  No clinical signs of withdrawal.  Patient seen here multiple times this month and has been requesting xanax's.  Has appt with Dr. Omelia BlackwaterHeaden on Jan 28.  I have advised him that further benzo's are not indicated at this time.      Elbony Mcclimans L. Trisha Mangleriplett, PA-C 11/07/14 [id number]  Benny LennertJoseph L Zammit, MD 11/08/14 [phone]

## 2014-11-09 ENCOUNTER — Emergency Department (HOSPITAL_COMMUNITY): Payer: Medicaid Other

## 2014-11-09 ENCOUNTER — Encounter (HOSPITAL_COMMUNITY): Payer: Self-pay | Admitting: Emergency Medicine

## 2014-11-09 ENCOUNTER — Emergency Department (HOSPITAL_COMMUNITY)
Admission: EM | Admit: 2014-11-09 | Discharge: 2014-11-09 | Disposition: A | Discharge status: Home / Self Care | Payer: Medicaid Other | Attending: Emergency Medicine | Admitting: Emergency Medicine

## 2014-11-09 DIAGNOSIS — Z8659 Personal history of other mental and behavioral disorders: Secondary | ICD-10-CM | POA: Diagnosis not present

## 2014-11-09 DIAGNOSIS — S199XXA Unspecified injury of neck, initial encounter: Secondary | ICD-10-CM | POA: Diagnosis present

## 2014-11-09 DIAGNOSIS — Z79899 Other long term (current) drug therapy: Secondary | ICD-10-CM | POA: Insufficient documentation

## 2014-11-09 DIAGNOSIS — Y998 Other external cause status: Secondary | ICD-10-CM | POA: Insufficient documentation

## 2014-11-09 DIAGNOSIS — Z880 Allergy status to penicillin: Secondary | ICD-10-CM | POA: Insufficient documentation

## 2014-11-09 DIAGNOSIS — J45909 Unspecified asthma, uncomplicated: Secondary | ICD-10-CM | POA: Insufficient documentation

## 2014-11-09 DIAGNOSIS — Y9389 Activity, other specified: Secondary | ICD-10-CM | POA: Diagnosis not present

## 2014-11-09 DIAGNOSIS — W19XXXA Unspecified fall, initial encounter: Secondary | ICD-10-CM

## 2014-11-09 DIAGNOSIS — Z8739 Personal history of other diseases of the musculoskeletal system and connective tissue: Secondary | ICD-10-CM | POA: Diagnosis not present

## 2014-11-09 DIAGNOSIS — W182XXA Fall in (into) shower or empty bathtub, initial encounter: Secondary | ICD-10-CM | POA: Insufficient documentation

## 2014-11-09 DIAGNOSIS — Y9289 Other specified places as the place of occurrence of the external cause: Secondary | ICD-10-CM | POA: Diagnosis not present

## 2014-11-09 DIAGNOSIS — Z7951 Long term (current) use of inhaled steroids: Secondary | ICD-10-CM | POA: Insufficient documentation

## 2014-11-09 DIAGNOSIS — M542 Cervicalgia: Secondary | ICD-10-CM

## 2014-11-09 MED ORDER — CYCLOBENZAPRINE HCL 10 MG PO TABS: 10.0000 mg | ORAL_TABLET | Freq: Two times a day (BID) | ORAL | Status: DC | PRN

## 2014-11-09 MED ORDER — IBUPROFEN 400 MG PO TABS
ORAL_TABLET | ORAL | Status: AC
  Administered 2014-11-09: 600 mg via ORAL
  Filled 2014-11-09: qty 2

## 2014-11-09 MED ORDER — IBUPROFEN 400 MG PO TABS
600.0000 mg | ORAL_TABLET | Freq: Once | ORAL | Status: AC
  Administered 2014-11-09: 600 mg via ORAL

## 2014-11-09 NOTE — ED Notes (Signed)
Pt reports slipping and falling in the shower, hit his upper back on the bathtub. Pt ambulating without difficulty.

## 2014-11-09 NOTE — ED Provider Notes (Signed)
CSN: [id number]     Arrival date & time 11/09/14  1504 History   First MD Initiated Contact with Patient 11/09/14 1547     Chief Complaint  Patient presents with  . Fall     (Consider location/radiation/quality/duration/timing/severity/associated sxs/prior Treatment) Patient is a 24 y.o. male presenting with neck injury. The history is provided by the patient.  Neck Injury This is a new problem. The current episode started today. The problem occurs constantly. The problem has been unchanged. The symptoms are aggravated by bending. He has tried nothing for the symptoms.   Eugene Hicks is a 24 y.o. male who presents to the ED for neck pain after a fall in the shower earlier today. He states that he slipped and fellThis is his 11th visit in the past 22 days. He has had 25 visits in the past 6 months. He states his uncle brought him to the ED from his home today. He has taken nothing for pain.   Past Medical History  Diagnosis Date  . ADHD (attention deficit hyperactivity disorder)   . Back pain   . Shoulder pain   . Asthma    Past Surgical History  Procedure Laterality Date  . Upper gi endoscopy     Family History  Problem Relation Age of Onset  . Stroke Father   . Seizures Mother   . Seizures Brother    History  Substance Use Topics  . Smoking status: Never Smoker   . Smokeless tobacco: Never Used  . Alcohol Use: No    Review of Systems Negative except as stated in HPI   Allergies  Codeine; Lactose intolerance (gi); Percocet; and Penicillins  Home Medications   Prior to Admission medications   Medication Sig Start Date End Date Taking? Authorizing Provider  ALPRAZolam Prudy Feeler(XANAX) 1 MG tablet Take 1 tablet twice daily for 7 days, then take 1/2 tablet twice daily for 14 days, then 1/2 tablet once daily for 7 days. Patient taking differently: Take 1 mg by mouth 2 (two) times daily.  10/28/14  Yes Eugene FanningJulie Idol, PA-C  fluticasone (FLONASE) 50 MCG/ACT nasal spray Place 2  sprays into the nose daily.     Yes Historical Provider, MD  lithium carbonate 300 MG capsule Take 300-600 mg by mouth 2 (two) times daily. Take one capsule (300mg ) every day in the morning, and take two capsules (600mg ) every day at bedtime   Yes Historical Provider, MD  loperamide (IMODIUM A-D) 2 MG tablet Take 1 tablet (2 mg total) by mouth 4 (four) times daily as needed for diarrhea or loose stools. 11/04/14  Yes Eugene MuldersScott Zackowski, MD  meclizine (ANTIVERT) 25 MG tablet Take 1 tablet (25 mg total) by mouth 2 (two) times daily as needed for dizziness. 10/30/14  Yes Eugene SkeensJoshua M Zavitz, MD  propranolol (INNOPRAN XL) 80 MG 24 hr capsule Take 80 mg by mouth daily.    Yes Historical Provider, MD  QUEtiapine (SEROQUEL) 300 MG tablet Take 300 mg by mouth every evening.    Yes Historical Provider, MD  cyclobenzaprine (FLEXERIL) 10 MG tablet Take 1 tablet (10 mg total) by mouth 2 (two) times daily as needed for muscle spasms. 11/09/14   Eugene Orlene OchM Neese, Hicks  EPINEPHrine (EPI-PEN) 0.3 mg/0.3 mL SOAJ injection Inject 0.3 mLs (0.3 mg total) into the muscle once. 07/26/13   Donnetta HutchingBrian Cook, MD  famotidine (PEPCID) 20 MG tablet Take 1 tablet (20 mg total) by mouth 2 (two) times daily. Patient not taking: Reported on 08/26/2014  03/06/14   Eugene Hicks   BP 110/63 mmHg  Pulse 76  Resp 14  Ht  (1.727 m)  Wt 131 lb (59.421 kg)  BMI 19.92 kg/m2  SpO2 99% Physical Exam  Constitutional: He is oriented to person, place, and time. He appears well-developed and well-nourished.  HENT:  Head: Normocephalic and atraumatic.  Mouth/Throat: No oropharyngeal exudate.  Eyes: Conjunctivae and EOM are normal.  Neck: Normal range of motion. Neck supple.  Cardiovascular: Normal rate and normal heart sounds.   Pulmonary/Chest: Effort normal.  Abdominal: Soft. There is no tenderness.  Musculoskeletal: Normal range of motion. He exhibits no edema.       Back:  Neurological: He is alert and oriented to person, place, and time. No  cranial nerve deficit.  Skin: Skin is warm and dry.  Nursing note and vitals reviewed.   ED Course  Procedures (including critical care time) Labs Review Dg Cervical Spine Complete  11/09/2014   CLINICAL DATA:  Recent fall in bathtub with neck and shoulder pain, initial encounter  EXAM: CERVICAL SPINE  4+ VIEWS  COMPARISON:  None.  FINDINGS: Seven cervical segments are well visualized. Vertebral body height is well maintained. The neural foramina are widely patent. No acute fracture or facet abnormality is noted in the cervical spine. Stable thoracic wedge deformity is noted.  IMPRESSION: No acute abnormality noted.   Electronically Signed   By: Alcide Clever M.D.   On: 11/09/2014 16:32     MDM  24 y.o. male with cervical neck pain s/p fall earlier today. Stable for discharge without neuro deficits and normal x-ray. Discussed with the patient clinical and x-ray findings and plan of care. All questioned fully answered. He will return if any problems arise. Will treat with muscle relaxant. Final diagnoses:  Fall  Cervical spine pain     [name], Hicks 11/09/14 1644  Lyanne Co, MD 11/09/14 [phone]

## 2014-11-09 NOTE — ED Notes (Signed)
Patient with no complaints at this time. Respirations even and unlabored. Skin warm/dry. Discharge instructions reviewed with patient at this time. Patient given opportunity to voice concerns/ask questions. Patient discharged at this time and left Emergency Department with steady gait.   

## 2014-11-14 ENCOUNTER — Emergency Department (HOSPITAL_COMMUNITY)
Admission: EM | Admit: 2014-11-14 | Discharge: 2014-11-14 | Disposition: A | Discharge status: Home / Self Care | Payer: Medicaid Other

## 2014-11-14 NOTE — ED Notes (Signed)
Called name for triage, no answer.

## 2014-11-14 NOTE — ED Notes (Signed)
Called name 3rd time for triage no answer.

## 2014-11-14 NOTE — ED Notes (Signed)
Called name no answer 

## 2014-11-17 ENCOUNTER — Encounter (HOSPITAL_COMMUNITY): Payer: Self-pay | Admitting: Emergency Medicine

## 2014-11-17 ENCOUNTER — Emergency Department (HOSPITAL_COMMUNITY)
Admission: EM | Admit: 2014-11-17 | Discharge: 2014-11-17 | Disposition: A | Discharge status: Home / Self Care | Payer: MEDICAID | Attending: Emergency Medicine | Admitting: Emergency Medicine

## 2014-11-17 DIAGNOSIS — F419 Anxiety disorder, unspecified: Secondary | ICD-10-CM | POA: Diagnosis not present

## 2014-11-17 DIAGNOSIS — Z79899 Other long term (current) drug therapy: Secondary | ICD-10-CM | POA: Insufficient documentation

## 2014-11-17 DIAGNOSIS — Z8739 Personal history of other diseases of the musculoskeletal system and connective tissue: Secondary | ICD-10-CM | POA: Diagnosis not present

## 2014-11-17 DIAGNOSIS — J45909 Unspecified asthma, uncomplicated: Secondary | ICD-10-CM | POA: Diagnosis not present

## 2014-11-17 DIAGNOSIS — Z880 Allergy status to penicillin: Secondary | ICD-10-CM | POA: Diagnosis not present

## 2014-11-17 DIAGNOSIS — R112 Nausea with vomiting, unspecified: Secondary | ICD-10-CM | POA: Insufficient documentation

## 2014-11-17 DIAGNOSIS — R079 Chest pain, unspecified: Secondary | ICD-10-CM | POA: Insufficient documentation

## 2014-11-17 MED ORDER — ALPRAZOLAM 0.5 MG PO TABS
1.0000 mg | ORAL_TABLET | Freq: Once | ORAL | Status: AC
  Administered 2014-11-17: 1 mg via ORAL
  Filled 2014-11-17: qty 2

## 2014-11-17 NOTE — Discharge Instructions (Signed)
Recommend taking Motrin for the chest pain. Resource guide provided below to help you find a regular doctor.   Emergency Department Resource Guide 1) Find a Doctor and Pay Out of Pocket Although you won't have to find out who is covered by your insurance plan, it is a good idea to ask around and get recommendations. You will then need to call the office and see if the doctor you have chosen will accept you as a new patient and what types of options they offer for patients who are self-pay. Some doctors offer discounts or will set up payment plans for their patients who do not have insurance, but you will need to ask so you aren't surprised when you get to your appointment.  2) Contact Your Local Health Department Not all health departments have doctors that can see patients for sick visits, but many do, so it is worth a call to see if yours does. If you don't know where your local health department is, you can check in your phone book. The CDC also has a tool to help you locate your state's health department, and many state websites also have listings of all of their local health departments.  3) Find a Walk-in Clinic If your illness is not likely to be very severe or complicated, you may want to try a walk in clinic. These are popping up all over the country in pharmacies, drugstores, and shopping centers. They're usually staffed by nurse practitioners or physician assistants that have been trained to treat common illnesses and complaints. They're usually fairly quick and inexpensive. However, if you have serious medical issues or chronic medical problems, these are probably not your best option.  No Primary Care Doctor: - Call Health Connect at  [phone] - they can help you locate a primary care doctor that  accepts your insurance, provides certain services, etc. - Physician Referral Service- [phone]  Chronic Pain Problems: Organization         Address  Phone   Notes  [facility]  [phone] Patients need to be referred by their primary care doctor.   Medication Assistance: Organization         Address  Phone   Notes  [facility] [address] [place], [place] [address] [phone]) [phone] --Must be a resident of [facility] -- Must have NO insurance coverage whatsoever (no Medicaid/ Medicare, etc.) -- The pt. MUST have a primary care doctor that directs their care regularly and follows them in the community   MedAssist  [phone]) [phone]   [employer]  [phone]) [phone]    Agencies that provide inexpensive medical care: Organization         Address  Phone   Notes  [facility]  [phone]) [phone]   [facility]    [phone]   [facility] [address] [place], [place] [address] [phone]   [facility] [address], [place] [phone]   [facility]anned Parenthood    [phone]   [facility]    [phone]   [facility]  [address], [place] Phone:  [phone], Fax:  [phone] Hours of Operation:  9 am - 6 pm, M-F.  Also accepts Medicaid/Medicare and self-pay.  [facility]  [address],  Phone: [phone], Fax: [phone]. Hours  of Operation:  8:30 am - 5:30 pm, M-F.  Also accepts Medicaid and self-pay.  [facility] [place] [address], [place] Phone: [phone]) [phone]   [facility] [address], [place], [place] [phone], Ext. [phone] Mondays & Thursdays: 7-9 AM.  First 15 patients are seen on a first come, first serve basis.    Medicaid-accepting [facility] Providers:  Organization         Address  Phone   Notes  [facility] [address], [place] [phone] Also accepts self-pay patients.  [facility] [address], [place]  [phone]) [phone]   [facility] [address], [place] [phone]) [phone]   [facility] [address] [place] [address], [place] [phone]) [phone]   Renaye RakersVeita Bland [address], [place]   [phone] Only accepts [place] Access [place]icaid patients after they have their name applied to their card.   Self-Pay (no insurance) in [facility] [place]:  Organization         Address  Phone   Notes  Sickle Cell Patients, [facility] [address], [place] [phone]) [phone]   [facility] [address], [place] [phone]) [phone]   [facility]  [address], [place] [phone]) [phone]   [facility]/Dr. Osei-Bonsu  [address] [place] [address], [place] or [phone], [place] [phone] Phone number for both [place] and [place] locations is the same.  [facility] [address], [place] [phone]   [facility] [address] [place] [address], [place] or [address] [phone]   [facility] [address], [place] [phone]) [phone], phone; [phone](336) [phone], fax Sees patients 1st and 3rd Saturday of every month.  Must not qualify for public or private insurance (i.e. Medicaid, Medicare, [place] Health Choice, Veterans' Benefits)  Household income should be no more than 200% of the poverty level The clinic cannot treat you if you are pregnant or think you are pregnant  Sexually transmitted diseases are not treated at the clinic.    Dental Care: Organization         Address  Phone  Notes  [facility] [address], [place] [phone]) [phone] Accepts children up to age 121 who are enrolled in [place]icaid or [place] Health Choice; pregnant women with a Medicaid card; and children who have applied for Medicaid or [place]  Health Choice, but were declined, whose parents can pay a reduced fee at time of service.  [facility] of [facility] [place]  [address], [place] [phone]) [phone] Accepts children up to age 24 who are enrolled in [place]icaid or  Health Choice; pregnant women with a Medicaid card; and children who have applied for Medicaid or  Health Choice, but were declined, whose parents can pay a reduced fee at time of service.  [facility]  [address], [place] [phone]) [phone] Patients are seen by appointment only. Walk-ins are not accepted. [facility] will see patients 24 years of age and older. Monday - Tuesday (8am-5pm) Most Wednesdays (8:30-5pm) $30 per visit, cash only  [facility]  [address], [place] [phone]) [phone] Patients are  seen by appointment only. Walk-ins are not accepted. [facility] will see patients 24 years of age and older. One Wednesday Evening (Monthly: Volunteer Based).  $30 per visit, cash only  [employer] [facility] [facility]  [phone] for adults; Children under age 674, call Graduate Pediatric Dentistry at [phone]. Children aged 714-14, please call [phone] to request a pediatric application.  Dental services are provided in all areas of dental care including fillings, crowns and bridges, complete and partial dentures, implants, gum treatment, root canals, and extractions. Preventive care is also provided. Treatment is provided to both adults and children. Patients are selected via a lottery and there is often a waiting list.   [facility] [address], [place]  [phone] [email or web address]   [facility] [address], [place], [place] [phone], Ext. [phone] Second and Fourth Thursday of each month, opens at 6:30 AM; Clinic ends at 9 AM.  Patients are seen on a first-come first-served basis, and a limited number are seen during  each clinic.   [facility]  [address], [place], [place] [phone]) [phone]   Eligibility Requirements You must have lived in [place], [place], or [place] counties for at least the last three months.   You cannot be eligible for state or federal sponsored National Cityhealthcare insurance, including CIGNAVeterans Administration, [place]icaid, or [employer] EntertainmentMedicare.   You generally cannot be eligible for healthcare insurance through your employer.    How to apply: Eligibility screenings are held every Tuesday and Wednesday afternoon from 1:00 pm until 4:00 pm. You do not need an appointment for the interview!  [facility] [address], [place], [place] [phone]   [facility]  [phone]   [facility]  [phone]   [facility]  [phone]    Behavioral Health Resources in the Community: Intensive Outpatient Programs Organization         Address  Phone  Notes  [facility] [address], [place], [place] [phone]   [facility] [address], [place], [place] [phone]   [facility] [address], [place], [place]  [phone]   [facility] [address],  [place], [place] [phone] or [phone]   Substance Abuse Resources Organization         Address  Phone  Notes  [facility]  [phone]   [facility]  [phone]   [facility]  [phone]   [facility]  [phone]   Residential & Outpatient Substance Abuse Program  [phone]   Psychological Services Organization         Address  Phone  Notes  [facility]  [phone]   [facility]  [phone]- [phone]   [facility] [address], [place] [phone] or [phone]    Mobile Crisis Teams Organization         Address  Phone  Notes  [facility], Mobile  Crisis Care Unit  [phone]   [facility]  [address] [place], [place] [phone]   Doristine LocksSharon DeEsch [address] [place] [phone]    Self-Help/Support Groups Organization         Address  Phone             Notes  [facility] - variety of support groups  336- [phone] Call for  more information  Narcotics Anonymous (NA), Caring Services [address], [employer] [place]  2 meetings at this location   Residential Facilities manager         Address  Phone  Notes  [facility] [place],    [place]  [phone]   [facility]  [address] [place] [address]7948, [place], [place]   [place], [place] [phone] Admissions: 8am-3pm M-F  Incentives Substance [place] [address],    [place], [place] [phone]   [facility] [address] [place], [place], [place]   [facility] [address],  [place], [place]   [facility] Programs - Intensive Outpatient [address], [place], [place]   [facility] ([address].,  [place], [place] [phone] or [phone]   [facility] (RTS) [address] [place], [place] Accepts Medicaid  [facility] [place] [address]  [place] [phone] Substance Abuse/Addiction Treatment   [facility] Organization         Address  Phone  Notes  [facility]  [phone]   Domenic Schwab, PhD [address] Arlis Porta [place], [place]   [phone] or [phone]   [place] [address] [place], [place] [phone]   [facility] [address], [place], [place] [phone] Insurance/Medicaid/sponsorship through [facility] and Families [address]  [place]                                    [place], [place] [phone] [place] [address] [place], [place] [phone]    Dr. Adele Schilder  [phone]   [facility]. 1) [address] [place] 2) [place] 3)  [address], [place] [phone]  [phone]   [place] [phone] or [phone] (After Hours)

## 2014-11-17 NOTE — ED Provider Notes (Signed)
CSN: [id number]     Arrival date & time 11/17/14  1653 History   First MD Initiated Contact with Patient 11/17/14 1730     Chief Complaint  Patient presents with  . Anxiety     (Consider location/radiation/quality/duration/timing/severity/associated sxs/prior Treatment) Patient is a 24 y.o. male presenting with anxiety. The history is provided by the patient.  Anxiety Associated symptoms include chest pain. Pertinent negatives include no abdominal pain, no headaches and no shortness of breath.   patient with multiple visits to the emergency department. Dyslexia the 12th visit this month. Patient usually seen for chest pain or other pain complaints or falls. Patient has a past history of anxiety and is always seeking Xanax prescriptions. Patient's workups in the past have been negative.  Patient states that chest pain and left anterior chest started around 10:30 this morning. Nonradiating associated with some nausea and vomiting. Patient also states that his anxiety is acting up. No shortness of breath. Chest pain is 10 out of 10. Sharp in nature.  Past Medical History  Diagnosis Date  . ADHD (attention deficit hyperactivity disorder)   . Back pain   . Shoulder pain   . Asthma    Past Surgical History  Procedure Laterality Date  . Upper gi endoscopy     Family History  Problem Relation Age of Onset  . Stroke Father   . Seizures Mother   . Seizures Brother    History  Substance Use Topics  . Smoking status: Never Smoker   . Smokeless tobacco: Never Used  . Alcohol Use: No    Review of Systems  Constitutional: Negative for fever.  HENT: Negative for congestion.   Eyes: Negative for redness.  Respiratory: Negative for shortness of breath.   Cardiovascular: Positive for chest pain.  Gastrointestinal: Positive for nausea and vomiting. Negative for abdominal pain.  Musculoskeletal: Negative for back pain and neck pain.  Skin: Negative for rash.  Neurological: Negative for  headaches.  Hematological: Does not bruise/bleed easily.  Psychiatric/Behavioral: The patient is nervous/anxious.       Allergies  Codeine; Lactose intolerance (gi); Percocet; and Penicillins  Home Medications   Prior to Admission medications   Medication Sig Start Date End Date Taking? Authorizing Provider  cyclobenzaprine (FLEXERIL) 10 MG tablet Take 1 tablet (10 mg total) by mouth 2 (two) times daily as needed for muscle spasms. 11/09/14  Yes Hope Orlene OchM Neese, NP  fluticasone (FLONASE) 50 MCG/ACT nasal spray Place 2 sprays into the nose daily.     Yes Historical Provider, MD  lithium carbonate 300 MG capsule Take 300-600 mg by mouth 2 (two) times daily. Take one capsule (300mg ) every day in the morning, and take two capsules (600mg ) every day at bedtime   Yes Historical Provider, MD  propranolol (INNOPRAN XL) 80 MG 24 hr capsule Take 80 mg by mouth daily.    Yes Historical Provider, MD  QUEtiapine (SEROQUEL) 300 MG tablet Take 300 mg by mouth every evening.    Yes Historical Provider, MD  ALPRAZolam Prudy Feeler(XANAX) 1 MG tablet Take 1 tablet twice daily for 7 days, then take 1/2 tablet twice daily for 14 days, then 1/2 tablet once daily for 7 days. Patient taking differently: Take 1 mg by mouth 2 (two) times daily.  10/28/14   Burgess AmorJulie Idol, PA-C  EPINEPHrine (EPI-PEN) 0.3 mg/0.3 mL SOAJ injection Inject 0.3 mLs (0.3 mg total) into the muscle once. 07/26/13   Donnetta HutchingBrian Cook, MD  famotidine (PEPCID) 20 MG tablet Take 1 tablet (  20 mg total) by mouth 2 (two) times daily. Patient not taking: Reported on 08/26/2014 03/06/14   [name], NP  loperamide (IMODIUM A-D) 2 MG tablet Take 1 tablet (2 mg total) by mouth 4 (four) times daily as needed for diarrhea or loose stools. Patient not taking: Reported on 11/17/2014 11/04/14   Vanetta Mulders, MD  meclizine (ANTIVERT) 25 MG tablet Take 1 tablet (25 mg total) by mouth 2 (two) times daily as needed for dizziness. Patient not taking: Reported on 11/17/2014 10/30/14    Enid Skeens, MD   BP 121/62 mmHg  Pulse 63  Temp(Src) 98.8 F (37.1 C) (Oral)  Resp 16  SpO2 100% Physical Exam  Constitutional: He is oriented to person, place, and time. He appears well-developed and well-nourished. No distress.  HENT:  Head: Normocephalic and atraumatic.  Mouth/Throat: Oropharynx is clear and moist.  Eyes: Conjunctivae and EOM are normal. Pupils are equal, round, and reactive to light.  Neck: Normal range of motion.  Cardiovascular: Normal rate, regular rhythm and normal heart sounds.   No murmur heard. Pulmonary/Chest: Effort normal and breath sounds normal.  Abdominal: Soft. Bowel sounds are normal. There is no tenderness.  Musculoskeletal: Normal range of motion.  Neurological: He is alert and oriented to person, place, and time. No cranial nerve deficit. He exhibits normal muscle tone. Coordination normal.  Skin: Skin is warm. No erythema.  Nursing note and vitals reviewed.   ED Course  Procedures (including critical care time) Labs Review Labs Reviewed - No data to display  Imaging Review No results found.   EKG Interpretation   Date/Time:  Saturday November 17 2014 17:12:13 EST Ventricular Rate:  56 PR Interval:  174 QRS Duration: 124 QT Interval:  392 QTC Calculation: 378 R Axis:   71 Text Interpretation:  Sinus bradycardia Non-specific intra-ventricular  conduction delay Borderline ECG No significant change since last tracing  Confirmed by Arbor Leer  MD, Ahmaud Duthie ([id number]) on 11/17/2014 6:07:58 PM      MDM   Final diagnoses:  Anxiety  Chest pain, unspecified chest pain type    Patient here with complaint of chest pain that started at 10:30 this morning. This is patient's 12th visit this month. For various pain complaints. Her falls. Patient always wanting Xanax. Patient is under care plan. Patient always states unable to get a regular doctor. Usually here with family member. Patient's EKG here today without any acute changes. Lungs are  clear. Oxygen saturations 100% no tachycardia. Patient had chest x-ray done by me on January 20 that was negative. Do not feel that there is evidence of a pneumothorax. Symptoms do not seem to be consistent with a pulmonary embolus. Is no swelling in his legs. Patient we discharged home with recommendation for Motrin. Patient informed unable to provide prescription for Xanax. Patient does have a history of anxiety and has been on Xanax in the past. One dose provided in the emergency department.    Vanetta Mulders, MD 11/17/14 [phone]

## 2014-11-17 NOTE — ED Notes (Signed)
Pt reports ran out of xanax prescription and reports anxiety, vomiting x1, and chest pain that started this am.

## 2014-11-26 ENCOUNTER — Emergency Department (HOSPITAL_COMMUNITY)
Admission: EM | Admit: 2014-11-26 | Discharge: 2014-11-26 | Discharge status: Against Medical Advice | Payer: Medicaid Other | Source: Home / Self Care

## 2014-11-26 ENCOUNTER — Encounter (HOSPITAL_COMMUNITY): Payer: Self-pay | Admitting: *Deleted

## 2014-11-26 DIAGNOSIS — J45909 Unspecified asthma, uncomplicated: Secondary | ICD-10-CM

## 2014-11-26 DIAGNOSIS — R109 Unspecified abdominal pain: Secondary | ICD-10-CM

## 2014-11-26 DIAGNOSIS — Z880 Allergy status to penicillin: Secondary | ICD-10-CM | POA: Insufficient documentation

## 2014-11-26 DIAGNOSIS — Z79899 Other long term (current) drug therapy: Secondary | ICD-10-CM | POA: Insufficient documentation

## 2014-11-26 DIAGNOSIS — R1013 Epigastric pain: Secondary | ICD-10-CM | POA: Insufficient documentation

## 2014-11-26 DIAGNOSIS — Z7951 Long term (current) use of inhaled steroids: Secondary | ICD-10-CM | POA: Insufficient documentation

## 2014-11-26 DIAGNOSIS — Z8659 Personal history of other mental and behavioral disorders: Secondary | ICD-10-CM | POA: Diagnosis not present

## 2014-11-26 DIAGNOSIS — L700 Acne vulgaris: Secondary | ICD-10-CM | POA: Insufficient documentation

## 2014-11-26 NOTE — ED Notes (Signed)
abd pain for 4 days , no NVD,  No fever or chills, says pain came from fall from deer stand in 2011

## 2014-11-26 NOTE — ED Notes (Signed)
abd pain for 4 days, no NVD.

## 2014-11-27 ENCOUNTER — Emergency Department (HOSPITAL_COMMUNITY)
Admission: EM | Admit: 2014-11-27 | Discharge: 2014-11-27 | Disposition: A | Discharge status: Home / Self Care | Payer: Medicaid Other | Attending: Emergency Medicine | Admitting: Emergency Medicine

## 2014-11-27 DIAGNOSIS — R1013 Epigastric pain: Secondary | ICD-10-CM

## 2014-11-27 MED ORDER — GI COCKTAIL ~~LOC~~
30.0000 mL | Freq: Once | ORAL | Status: AC
  Administered 2014-11-27: 30 mL via ORAL
  Filled 2014-11-27: qty 30

## 2014-11-27 NOTE — ED Notes (Signed)
MD at bedside. 

## 2014-11-27 NOTE — Discharge Instructions (Signed)
Follow up as needed

## 2014-11-27 NOTE — ED Provider Notes (Signed)
CSN: [id number]     Arrival date & time 11/26/14  2210 History   First MD Initiated Contact with Patient 11/27/14 0135     Chief Complaint  Patient presents with  . Abdominal Pain     (Consider location/radiation/quality/duration/timing/severity/associated sxs/prior Treatment) HPI  Patient reports he's had some epigastric abdominal pain for the past 2 days. He then states "I think I caught a cold". He states he has a mild cough and a stuffy nose. He states he thinks he may have had a fever last night. He denies nausea or vomiting but states he had diarrhea 7 today. He denies feeling dizzy or weak. He comes to the ED frequently, he has had 25 ED visits in the past 6 months.  PCP unknown  Past Medical History  Diagnosis Date  . ADHD (attention deficit hyperactivity disorder)   . Back pain   . Shoulder pain   . Asthma    Past Surgical History  Procedure Laterality Date  . Upper gi endoscopy     Family History  Problem Relation Age of Onset  . Stroke Father   . Seizures Mother   . Seizures Brother    History  Substance Use Topics  . Smoking status: Never Smoker   . Smokeless tobacco: Never Used  . Alcohol Use: No   lives at home Lives with parents  Review of Systems  All other systems reviewed and are negative.     Allergies  Codeine; Lactose intolerance (gi); Percocet; and Penicillins  Home Medications   Prior to Admission medications   Medication Sig Start Date End Date Taking? Authorizing Provider  ALPRAZolam Prudy Feeler(XANAX) 1 MG tablet Take 1 tablet twice daily for 7 days, then take 1/2 tablet twice daily for 14 days, then 1/2 tablet once daily for 7 days. Patient taking differently: Take 1 mg by mouth 2 (two) times daily.  10/28/14   Burgess AmorJulie Idol, PA-C  cyclobenzaprine (FLEXERIL) 10 MG tablet Take 1 tablet (10 mg total) by mouth 2 (two) times daily as needed for muscle spasms. 11/09/14   Hope Orlene OchM Neese, NP  EPINEPHrine (EPI-PEN) 0.3 mg/0.3 mL SOAJ injection Inject 0.3 mLs  (0.3 mg total) into the muscle once. 07/26/13   Donnetta HutchingBrian Cook, MD  famotidine (PEPCID) 20 MG tablet Take 1 tablet (20 mg total) by mouth 2 (two) times daily. Patient not taking: Reported on 08/26/2014 03/06/14   Janne NapoleonHope M Neese, NP  fluticasone [facility](FLONASE) 50 MCG/ACT nasal spray Place 2 sprays into the nose daily.      Historical Provider, MD  lithium carbonate 300 MG capsule Take 300-600 mg by mouth 2 (two) times daily. Take one capsule (300mg ) every day in the morning, and take two capsules (600mg ) every day at bedtime    Historical Provider, MD  loperamide (IMODIUM A-D) 2 MG tablet Take 1 tablet (2 mg total) by mouth 4 (four) times daily as needed for diarrhea or loose stools. Patient not taking: Reported on 11/17/2014 11/04/14   Vanetta MuldersScott Zackowski, MD  meclizine (ANTIVERT) 25 MG tablet Take 1 tablet (25 mg total) by mouth 2 (two) times daily as needed for dizziness. Patient not taking: Reported on 11/17/2014 10/30/14   Enid SkeensJoshua M Zavitz, MD  propranolol (INNOPRAN XL) 80 MG 24 hr capsule Take 80 mg by mouth daily.     Historical Provider, MD  QUEtiapine (SEROQUEL) 300 MG tablet Take 300 mg by mouth every evening.     Historical Provider, MD   BP 105/56 mmHg  Pulse 69  Temp(Src) 97.8 F (36.6 C) (Oral)  Resp 20  Ht  (1.727 m)  Wt 140 lb (63.504 kg)  BMI 21.29 kg/m2  SpO2 100%  Vital signs normal   Physical Exam  Constitutional: He is oriented to person, place, and time. He appears well-developed and well-nourished.  Non-toxic appearance. He does not appear ill. No distress.  HENT:  Head: Normocephalic and atraumatic.  Right Ear: External ear normal.  Left Ear: External ear normal.  Nose: Nose normal. No mucosal edema or rhinorrhea.  Mouth/Throat: Oropharynx is clear and moist and mucous membranes are normal. No dental abscesses or uvula swelling.  Eyes: Conjunctivae and EOM are normal. Pupils are equal, round, and reactive to light.  Neck: Normal range of motion and full passive range of motion  without pain. Neck supple.  Cardiovascular: Normal rate, regular rhythm and normal heart sounds.  Exam reveals no gallop and no friction rub.   No murmur heard. Pulmonary/Chest: Effort normal and breath sounds normal. No respiratory distress. He has no wheezes. He has no rhonchi. He has no rales. He exhibits no tenderness and no crepitus.  Abdominal: Soft. Normal appearance and bowel sounds are normal. He exhibits no distension. There is tenderness. There is no rebound and no guarding.  Mild epigastric tenderness to palpation  Musculoskeletal: Normal range of motion. He exhibits no edema or tenderness.  Moves all extremities well.   Neurological: He is alert and oriented to person, place, and time. He has normal strength. No cranial nerve deficit.  Skin: Skin is warm, dry and intact. No rash noted. No erythema. No pallor.  Multiple blackheads on his skin  Psychiatric: He has a normal mood and affect. His speech is normal and behavior is normal. His mood appears not anxious.  Nursing note and vitals reviewed.   ED Course  Procedures (including critical care time)  Medications  gi cocktail (Maalox,Lidocaine,Donnatal) (30 mLs Oral Given 11/27/14 0224)    Recheck patient states his pain is gone and he's ready to be discharged. Patient had no coughing during my interview. Patient had no diarrhea after he was brought back to a room.  Labs Review Labs Reviewed - No data to display  Imaging Review No results found.   EKG Interpretation None      MDM   Final diagnoses:  Epigastric abdominal pain    Plan discharge  Devoria Albe, MD, Franz Dell, MD 11/27/14 [phone]

## 2014-11-30 ENCOUNTER — Emergency Department (HOSPITAL_COMMUNITY)
Admission: EM | Admit: 2014-11-30 | Discharge: 2014-11-30 | Disposition: A | Discharge status: Home / Self Care | Payer: Medicaid Other | Attending: Emergency Medicine | Admitting: Emergency Medicine

## 2014-11-30 ENCOUNTER — Encounter (HOSPITAL_COMMUNITY): Payer: Self-pay | Admitting: Emergency Medicine

## 2014-11-30 DIAGNOSIS — Y9289 Other specified places as the place of occurrence of the external cause: Secondary | ICD-10-CM | POA: Diagnosis not present

## 2014-11-30 DIAGNOSIS — Y9389 Activity, other specified: Secondary | ICD-10-CM | POA: Diagnosis not present

## 2014-11-30 DIAGNOSIS — J45909 Unspecified asthma, uncomplicated: Secondary | ICD-10-CM | POA: Diagnosis not present

## 2014-11-30 DIAGNOSIS — Y998 Other external cause status: Secondary | ICD-10-CM | POA: Insufficient documentation

## 2014-11-30 DIAGNOSIS — Z8659 Personal history of other mental and behavioral disorders: Secondary | ICD-10-CM | POA: Diagnosis not present

## 2014-11-30 DIAGNOSIS — Z880 Allergy status to penicillin: Secondary | ICD-10-CM | POA: Insufficient documentation

## 2014-11-30 DIAGNOSIS — S0990XA Unspecified injury of head, initial encounter: Secondary | ICD-10-CM | POA: Insufficient documentation

## 2014-11-30 DIAGNOSIS — Z79899 Other long term (current) drug therapy: Secondary | ICD-10-CM | POA: Insufficient documentation

## 2014-11-30 DIAGNOSIS — Z7951 Long term (current) use of inhaled steroids: Secondary | ICD-10-CM | POA: Diagnosis not present

## 2014-11-30 MED ORDER — IBUPROFEN 600 MG PO TABS: 600.0000 mg | ORAL_TABLET | Freq: Three times a day (TID) | ORAL | Status: DC | PRN

## 2014-11-30 MED ORDER — IBUPROFEN 800 MG PO TABS
800.0000 mg | ORAL_TABLET | Freq: Once | ORAL | Status: AC
  Administered 2014-11-30: 800 mg via ORAL
  Filled 2014-11-30: qty 1
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
## 2014-12-18 ENCOUNTER — Encounter (HOSPITAL_COMMUNITY): Payer: Self-pay

## 2014-12-18 ENCOUNTER — Emergency Department (HOSPITAL_COMMUNITY)
Admission: EM | Admit: 2014-12-18 | Discharge: 2014-12-18 | Disposition: A | Discharge status: Home / Self Care | Payer: Medicaid Other | Attending: Emergency Medicine | Admitting: Emergency Medicine

## 2014-12-18 DIAGNOSIS — R42 Dizziness and giddiness: Secondary | ICD-10-CM | POA: Insufficient documentation

## 2014-12-18 DIAGNOSIS — Z7951 Long term (current) use of inhaled steroids: Secondary | ICD-10-CM | POA: Diagnosis not present

## 2014-12-18 DIAGNOSIS — Z880 Allergy status to penicillin: Secondary | ICD-10-CM | POA: Diagnosis not present

## 2014-12-18 DIAGNOSIS — Z8659 Personal history of other mental and behavioral disorders: Secondary | ICD-10-CM | POA: Insufficient documentation

## 2014-12-18 DIAGNOSIS — J45909 Unspecified asthma, uncomplicated: Secondary | ICD-10-CM | POA: Diagnosis not present

## 2014-12-18 DIAGNOSIS — Z79899 Other long term (current) drug therapy: Secondary | ICD-10-CM | POA: Diagnosis not present

## 2014-12-18 DIAGNOSIS — R197 Diarrhea, unspecified: Secondary | ICD-10-CM | POA: Diagnosis present

## 2014-12-18 MED ORDER — DIPHENOXYLATE-ATROPINE 2.5-0.025 MG PO TABS
2.0000 | ORAL_TABLET | Freq: Once | ORAL | Status: AC
  Administered 2014-12-18: 2 via ORAL
  Filled 2014-12-18: qty 2

## 2014-12-18 NOTE — Discharge Instructions (Signed)
No signs of significant dehydration on your exam today. Drink plenty of water. Immodium as needed for diarrhea more than 3 times per day.  Diarrhea Diarrhea is frequent loose and watery bowel movements. It can cause you to feel weak and dehydrated. Dehydration can cause you to become tired and thirsty, have a dry mouth, and have decreased urination that often is dark yellow. Diarrhea is a sign of another problem, most often an infection that will not last long. In most cases, diarrhea typically lasts 2-3 days. However, it can last longer if it is a sign of something more serious. It is important to treat your diarrhea as directed by your caregiver to lessen or prevent future episodes of diarrhea. CAUSES  Some common causes include:  Gastrointestinal infections caused by viruses, bacteria, or parasites.  Food poisoning or food allergies.  Certain medicines, such as antibiotics, chemotherapy, and laxatives.  Artificial sweeteners and fructose.  Digestive disorders. HOME CARE INSTRUCTIONS  Ensure adequate fluid intake (hydration): Have 1 cup (8 oz) of fluid for each diarrhea episode. Avoid fluids that contain simple sugars or sports drinks, fruit juices, whole milk products, and sodas. Your urine should be clear or pale yellow if you are drinking enough fluids. Hydrate with an oral rehydration solution that you can purchase at pharmacies, retail stores, and online. You can prepare an oral rehydration solution at home by mixing the following ingredients together:   - tsp table salt.   tsp baking soda.   tsp salt substitute containing potassium chloride.  1  tablespoons sugar.  1 L (34 oz) of water.  Certain foods and beverages may increase the speed at which food moves through the gastrointestinal (GI) tract. These foods and beverages should be avoided and include:  Caffeinated and alcoholic beverages.  High-fiber foods, such as raw fruits and vegetables, nuts, seeds, and whole grain  breads and cereals.  Foods and beverages sweetened with sugar alcohols, such as xylitol, sorbitol, and mannitol.  Some foods may be well tolerated and may help thicken stool including:  Starchy foods, such as rice, toast, pasta, low-sugar cereal, oatmeal, grits, baked potatoes, crackers, and bagels.  Bananas.  Applesauce.  Add probiotic-rich foods to help increase healthy bacteria in the GI tract, such as yogurt and fermented milk products.  Wash your hands well after each diarrhea episode.  Only take over-the-counter or prescription medicines as directed by your caregiver.  Take a warm bath to relieve any burning or pain from frequent diarrhea episodes. SEEK IMMEDIATE MEDICAL CARE IF:   You are unable to keep fluids down.  You have persistent vomiting.  You have blood in your stool, or your stools are black and tarry.  You do not urinate in 6-8 hours, or there is only a small amount of very dark urine.  You have abdominal pain that increases or localizes.  You have weakness, dizziness, confusion, or light-headedness.  You have a severe headache.  Your diarrhea gets worse or does not get better.  You have a fever or persistent symptoms for more than 2-3 days.  You have a fever and your symptoms suddenly get worse. MAKE SURE YOU:   Understand these instructions.  Will watch your condition.  Will get help right away if you are not doing well or get worse. Document Released: 09/25/2002 Document Revised: 02/19/2014 Document Reviewed: 06/12/2012 [facility]Exit[facility] Patient Information 2015 [place]Care, [place]. This information is not intended to replace advice given to you by your health care provider. Make sure you discuss any  questions you have with your health care provider. ° °

## 2014-12-18 NOTE — ED Provider Notes (Signed)
CSN: [id number]     Arrival date & time 12/18/14  1934 History  This chart was scribed for Rolland Porter, MD by Richarda Overlie, ED Scribe. This patient was seen in room APA11/APA11 and the patient's care was started 7:55 PM.     Chief Complaint  Patient presents with  . Dizziness   The history is provided by the patient. No language interpreter was used.   HPI Comments: KENDARRIUS TANZI is a 24 y.o. male who presents to the Emergency Department complaining of dizziness for the last 5 days. Pt says he feels "dehydrated and weak" for the last 5 days that has progressively worsened . Pt complains of diarrhea that started today. He states that he has drank 10 cups of water today. Pt reports his mother has had similar symptoms.  He reports no alleviating or modifying factors at this time. He denies vomiting, blood in his stool and syncope.    Past Medical History  Diagnosis Date  . ADHD (attention deficit hyperactivity disorder)   . Back pain   . Shoulder pain   . Asthma    Past Surgical History  Procedure Laterality Date  . Upper gi endoscopy     Family History  Problem Relation Age of Onset  . Stroke Father   . Seizures Mother   . Seizures Brother    History  Substance Use Topics  . Smoking status: Never Smoker   . Smokeless tobacco: Never Used  . Alcohol Use: No    Review of Systems  Constitutional: Negative for fever, chills, diaphoresis, appetite change and fatigue.  HENT: Negative for mouth sores, sore throat and trouble swallowing.   Eyes: Negative for visual disturbance.  Respiratory: Negative for cough, chest tightness, shortness of breath and wheezing.   Cardiovascular: Negative for chest pain.  Gastrointestinal: Positive for diarrhea. Negative for nausea, vomiting, abdominal pain and abdominal distention.  Endocrine: Negative for polydipsia, polyphagia and polyuria.  Genitourinary: Negative for dysuria, frequency and hematuria.  Musculoskeletal: Negative for gait  problem.  Skin: Negative for color change, pallor and rash.  Neurological: Positive for dizziness. Negative for syncope, light-headedness and headaches.  Hematological: Does not bruise/bleed easily.  Psychiatric/Behavioral: Negative for behavioral problems and confusion.      Allergies  Codeine; Lactose intolerance (gi); Percocet; and Penicillins  Home Medications   Prior to Admission medications   Medication Sig Start Date End Date Taking? Authorizing Provider  ALPRAZolam Prudy Feeler) 1 MG tablet Take 1 tablet twice daily for 7 days, then take 1/2 tablet twice daily for 14 days, then 1/2 tablet once daily for 7 days. Patient taking differently: Take 1 mg by mouth 2 (two) times daily.  10/28/14   Burgess Amor, PA-C  cyclobenzaprine (FLEXERIL) 10 MG tablet Take 1 tablet (10 mg total) by mouth 2 (two) times daily as needed for muscle spasms. 11/09/14   Hope Orlene Och, NP  EPINEPHrine (EPI-PEN) 0.3 mg/0.3 mL SOAJ injection Inject 0.3 mLs (0.3 mg total) into the muscle once. 07/26/13   Donnetta Hutching, MD  famotidine (PEPCID) 20 MG tablet Take 1 tablet (20 mg total) by mouth 2 (two) times daily. Patient not taking: Reported on 08/26/2014 03/06/14   [name], NP  fluticasone [facility]) 50 MCG/ACT nasal spray Place 2 sprays into the nose daily.      Historical Provider, MD  ibuprofen (ADVIL,MOTRIN) 600 MG tablet Take 1 tablet (600 mg total) by mouth every 8 (eight) hours as needed for headache. 11/30/14   Burgess Amor, PA-C  lithium carbonate 300 MG capsule Take 300-600 mg by mouth 2 (two) times daily. Take one capsule (300mg ) every day in the morning, and take two capsules (600mg ) every day at bedtime    Historical Provider, MD  loperamide (IMODIUM A-D) 2 MG tablet Take 1 tablet (2 mg total) by mouth 4 (four) times daily as needed for diarrhea or loose stools. Patient not taking: Reported on 11/17/2014 11/04/14   Vanetta MuldersScott Zackowski, MD  meclizine (ANTIVERT) 25 MG tablet Take 1 tablet (25 mg total) by mouth 2 (two)  times daily as needed for dizziness. Patient not taking: Reported on 11/17/2014 10/30/14   Enid SkeensJoshua M Zavitz, MD  propranolol (INNOPRAN XL) 80 MG 24 hr capsule Take 80 mg by mouth daily.     Historical Provider, MD  QUEtiapine (SEROQUEL) 300 MG tablet Take 300 mg by mouth every evening.     Historical Provider, MD   BP 125/74 mmHg  Pulse 83  Temp(Src) 98 F (36.7 C) (Oral)  Resp 16  Ht 5\' 8"  (1.727 m)  Wt 133 lb (60.328 kg)  BMI 20.23 kg/m2  SpO2 99% Physical Exam  Constitutional: He is oriented to person, place, and time. He appears well-developed and well-nourished. No distress.  HENT:  Head: Normocephalic.  Eyes: Conjunctivae are normal. Pupils are equal, round, and reactive to light. No scleral icterus.  Neck: Normal range of motion. Neck supple. No thyromegaly present.  Cardiovascular: Normal rate and regular rhythm.  Exam reveals no gallop and no friction rub.   No murmur heard. Pulmonary/Chest: Effort normal and breath sounds normal. No respiratory distress. He has no wheezes. He has no rales.  Abdominal: Soft. Bowel sounds are normal. He exhibits no distension. There is no tenderness. There is no rebound.  Musculoskeletal: Normal range of motion.  Neurological: He is alert and oriented to person, place, and time.  Skin: Skin is warm and dry. No rash noted.  Psychiatric: He has a normal mood and affect. His behavior is normal.  Nursing note and vitals reviewed.   ED Course  Procedures   DIAGNOSTIC STUDIES: Oxygen Saturation is 99% on RA, normal by my interpretation. Pt has a BP of 103/69 laying and a HR 80 currently. He has a BP of 109/63 standing.  COORDINATION OF CARE: 8:00 PM Discussed treatment plan with pt at bedside and pt agreed to plan.   Labs Review Labs Reviewed - No data to display  Imaging Review No results found.   EKG Interpretation None      MDM   Final diagnoses:  Diarrhea   Patient reports increased frequency of stools without blood. No  vomiting. Given water here. Drinking without difficulty. Not orthostatic by vital signs. signs of dehydration. benign exam.    Rolland PorterMark Shawnda Mauney, MD 12/18/14 2002

## 2014-12-18 NOTE — ED Notes (Signed)
Patient c/o of being "weak" and "dizzy" for the last five days, has gotten progressively worsen. States he is "dehydrated".

## 2014-12-18 NOTE — ED Notes (Signed)
Patient also c/o of lower back pain.

## 2015-01-24 ENCOUNTER — Emergency Department (HOSPITAL_COMMUNITY)
Admission: EM | Admit: 2015-01-24 | Discharge: 2015-01-24 | Disposition: A | Discharge status: Home / Self Care | Payer: Medicaid Other | Attending: Emergency Medicine | Admitting: Emergency Medicine

## 2015-01-24 ENCOUNTER — Encounter (HOSPITAL_COMMUNITY): Payer: Self-pay | Admitting: Emergency Medicine

## 2015-01-24 DIAGNOSIS — Z8659 Personal history of other mental and behavioral disorders: Secondary | ICD-10-CM | POA: Insufficient documentation

## 2015-01-24 DIAGNOSIS — K088 Other specified disorders of teeth and supporting structures: Secondary | ICD-10-CM | POA: Diagnosis present

## 2015-01-24 DIAGNOSIS — Z7951 Long term (current) use of inhaled steroids: Secondary | ICD-10-CM | POA: Insufficient documentation

## 2015-01-24 DIAGNOSIS — J45909 Unspecified asthma, uncomplicated: Secondary | ICD-10-CM | POA: Insufficient documentation

## 2015-01-24 DIAGNOSIS — Z880 Allergy status to penicillin: Secondary | ICD-10-CM | POA: Insufficient documentation

## 2015-01-24 DIAGNOSIS — Z8739 Personal history of other diseases of the musculoskeletal system and connective tissue: Secondary | ICD-10-CM | POA: Insufficient documentation

## 2015-01-24 DIAGNOSIS — K047 Periapical abscess without sinus: Secondary | ICD-10-CM

## 2015-01-24 DIAGNOSIS — Z79899 Other long term (current) drug therapy: Secondary | ICD-10-CM | POA: Insufficient documentation

## 2015-01-24 DIAGNOSIS — K029 Dental caries, unspecified: Secondary | ICD-10-CM | POA: Insufficient documentation

## 2015-01-24 MED ORDER — IBUPROFEN 800 MG PO TABS: 800.0000 mg | ORAL_TABLET | Freq: Once | ORAL | Status: DC

## 2015-01-24 MED ORDER — IBUPROFEN 800 MG PO TABS
800.0000 mg | ORAL_TABLET | Freq: Once | ORAL | Status: AC
Start: 2015-01-24 — End: 2015-01-24
  Administered 2015-01-24: 800 mg via ORAL
  Filled 2015-01-24: qty 1

## 2015-01-24 MED ORDER — CLINDAMYCIN HCL 150 MG PO CAPS: 150.0000 mg | ORAL_CAPSULE | Freq: Four times a day (QID) | ORAL | Status: DC

## 2015-01-24 MED ORDER — IBUPROFEN 400 MG PO TABS: 400.0000 mg | ORAL_TABLET | Freq: Four times a day (QID) | ORAL | Status: DC | PRN

## 2015-01-24 MED ORDER — CLINDAMYCIN HCL 150 MG PO CAPS
150.0000 mg | ORAL_CAPSULE | Freq: Once | ORAL | Status: AC
  Administered 2015-01-24: 150 mg via ORAL
  Filled 2015-01-24: qty 1

## 2015-01-24 NOTE — ED Notes (Signed)
PT stated had tylenol this am at 0800.

## 2015-01-24 NOTE — ED Notes (Signed)
PT c/o lower dental pain x2 days.

## 2015-01-24 NOTE — Discharge Instructions (Signed)

## 2015-01-25 NOTE — ED Provider Notes (Signed)
CSN: [id number]     Arrival date & time 01/24/15  1102 History   First MD Initiated Contact with Patient 01/24/15 1133     Chief Complaint  Patient presents with  . Dental Pain     (Consider location/radiation/quality/duration/timing/severity/associated sxs/prior Treatment) Patient is a 24 y.o. male presenting with tooth pain. The history is provided by the patient and a parent.  Dental Pain Location:  Lower Lower teeth location:  23/LL lateral incisor, 24/LL central incisor, 25/RL central incisor and 26/RL lateral incisor Quality:  Throbbing Severity:  Moderate Onset quality:  Gradual Duration:  2 days Timing:  Constant Progression:  Worsening Chronicity:  Recurrent Context: dental caries and poor dentition   Relieved by:  Nothing Worsened by:  Nothing tried Ineffective treatments:  Acetaminophen Associated symptoms: gum swelling   Associated symptoms: no difficulty swallowing, no facial pain, no facial swelling, no fever, no neck pain, no neck swelling and no trismus   Risk factors: lack of dental care     Past Medical History  Diagnosis Date  . ADHD (attention deficit hyperactivity disorder)   . Back pain   . Shoulder pain   . Asthma    Past Surgical History  Procedure Laterality Date  . Upper gi endoscopy     Family History  Problem Relation Age of Onset  . Stroke Father   . Seizures Mother   . Seizures Brother    History  Substance Use Topics  . Smoking status: Never Smoker   . Smokeless tobacco: Never Used  . Alcohol Use: No    Review of Systems  Constitutional: Negative for fever.  HENT: Positive for dental problem. Negative for facial swelling and sore throat.   Respiratory: Negative for shortness of breath.   Musculoskeletal: Negative for neck pain and neck stiffness.      Allergies  Codeine; Lactose intolerance (gi); Percocet; and Penicillins  Home Medications   Prior to Admission medications   Medication Sig Start Date End Date Taking?  Authorizing Provider  ALPRAZolam Prudy Feeler) 1 MG tablet Take 1 tablet twice daily for 7 days, then take 1/2 tablet twice daily for 14 days, then 1/2 tablet once daily for 7 days. Patient taking differently: Take 1 mg by mouth 2 (two) times daily.  10/28/14  Yes Burgess Amor, PA-C  EPINEPHrine (EPI-PEN) 0.3 mg/0.3 mL SOAJ injection Inject 0.3 mLs (0.3 mg total) into the muscle once. 07/26/13  Yes Donnetta Hutching, MD  fluticasone [facility]) 50 MCG/ACT nasal spray Place 2 sprays into the nose daily.     Yes Historical Provider, MD  lithium carbonate 300 MG capsule Take 300-600 mg by mouth 2 (two) times daily. Take one capsule ( ) every day in the morning, and take two capsules ( ) every day at bedtime   Yes Historical Provider, MD  propranolol (INNOPRAN XL) 80 MG 24 hr capsule Take 80 mg by mouth daily.    Yes Historical Provider, MD  QUEtiapine (SEROQUEL) 300 MG tablet Take 300 mg by mouth every evening.    Yes Historical Provider, MD  clindamycin (CLEOCIN) 150 MG capsule Take 1 capsule (150 mg total) by mouth every 6 (six) hours. 01/24/15   Burgess Amor, PA-C  cyclobenzaprine (FLEXERIL) 10 MG tablet Take 1 tablet (10 mg total) by mouth 2 (two) times daily as needed for muscle spasms. Patient not taking: Reported on 01/24/2015 11/09/14   [name], NP  famotidine (PEPCID) 20 MG tablet Take 1 tablet (20 mg total) by mouth 2 (two) times daily. Patient  not taking: Reported on 08/26/2014 03/06/14   Janne NapoleonHope M Neese, NP  ibuprofen (ADVIL,MOTRIN) 400 MG tablet Take 1 tablet (400 mg total) by mouth every 6 (six) hours as needed. 01/24/15   Burgess AmorJulie Carey Johndrow, PA-C  loperamide (IMODIUM A-D) 2 MG tablet Take 1 tablet (2 mg total) by mouth 4 (four) times daily as needed for diarrhea or loose stools. Patient not taking: Reported on 11/17/2014 11/04/14   Vanetta MuldersScott Zackowski, MD  meclizine (ANTIVERT) 25 MG tablet Take 1 tablet (25 mg total) by mouth 2 (two) times daily as needed for dizziness. Patient not taking: Reported on 11/17/2014 10/30/14    Blane OharaJoshua Zavitz, MD   BP 112/68 mmHg  Pulse 75  Temp(Src) 100.7 F (38.2 C) (Oral)  Resp 18  Ht 5\' 7"  (1.702 m)  Wt 133 lb (60.328 kg)  BMI 20.83 kg/m2  SpO2 100% Physical Exam  Constitutional: He is oriented to person, place, and time. He appears well-developed and well-nourished. No distress.  HENT:  Head: Normocephalic and atraumatic.  Right Ear: Tympanic membrane and external ear normal.  Left Ear: Tympanic membrane and external ear normal.  Mouth/Throat: Oropharynx is clear and moist and mucous membranes are normal. No oral lesions. No trismus in the jaw. Abnormal dentition.  Pt with poor dentition with all teeth fractured/decayed to gingival line.  He has moderate edema and erythema of the gingiva along his lower incisors.  No fluctuance or pus pocket appreciated.  Sublingual space is soft.  Eyes: Conjunctivae are normal.  Neck: Normal range of motion. Neck supple.  Cardiovascular: Normal rate and normal heart sounds.   Pulmonary/Chest: Effort normal.  Abdominal: He exhibits no distension.  Musculoskeletal: Normal range of motion.  Lymphadenopathy:    He has no cervical adenopathy.  Neurological: He is alert and oriented to person, place, and time.  Skin: Skin is warm and dry. No erythema.  Psychiatric: He has a normal mood and affect.    ED Course  Procedures (including critical care time) Labs Review Labs Reviewed - No data to display  Imaging Review No results found.   EKG Interpretation None      MDM   Final diagnoses:  Dental infection    Father at bedside states he is planning to [place]. Advised pt to contact [place] (in [place] to arrange referral as his insurance will require this.  He was placed on clindamycin for infection. Ibuprofen for pain.  Prn f/u anticipated.  No obvious dental abscess on exam.  He does have extensive dental decay and gingivitis.      Burgess AmorJulie Shonya Sumida, PA-C 01/25/15  1010  Burgess AmorJulie Auron Tadros, PA-C 01/25/15 1011  Donnetta HutchingBrian Cook, MD 01/25/15 1340

## 2015-01-27 ENCOUNTER — Encounter (HOSPITAL_COMMUNITY): Payer: Self-pay | Admitting: *Deleted

## 2015-01-27 ENCOUNTER — Emergency Department (HOSPITAL_COMMUNITY)
Admission: EM | Admit: 2015-01-27 | Discharge: 2015-01-27 | Disposition: A | Discharge status: Home / Self Care | Payer: Medicaid Other | Attending: Emergency Medicine | Admitting: Emergency Medicine

## 2015-01-27 DIAGNOSIS — Z880 Allergy status to penicillin: Secondary | ICD-10-CM | POA: Diagnosis not present

## 2015-01-27 DIAGNOSIS — S81812A Laceration without foreign body, left lower leg, initial encounter: Secondary | ICD-10-CM | POA: Insufficient documentation

## 2015-01-27 DIAGNOSIS — W293XXA Contact with powered garden and outdoor hand tools and machinery, initial encounter: Secondary | ICD-10-CM | POA: Diagnosis not present

## 2015-01-27 DIAGNOSIS — Z79899 Other long term (current) drug therapy: Secondary | ICD-10-CM | POA: Diagnosis not present

## 2015-01-27 DIAGNOSIS — Y9389 Activity, other specified: Secondary | ICD-10-CM | POA: Insufficient documentation

## 2015-01-27 DIAGNOSIS — Y9289 Other specified places as the place of occurrence of the external cause: Secondary | ICD-10-CM | POA: Insufficient documentation

## 2015-01-27 DIAGNOSIS — Z8659 Personal history of other mental and behavioral disorders: Secondary | ICD-10-CM | POA: Insufficient documentation

## 2015-01-27 DIAGNOSIS — Z7951 Long term (current) use of inhaled steroids: Secondary | ICD-10-CM | POA: Diagnosis not present

## 2015-01-27 DIAGNOSIS — J45909 Unspecified asthma, uncomplicated: Secondary | ICD-10-CM | POA: Insufficient documentation

## 2015-01-27 DIAGNOSIS — Y998 Other external cause status: Secondary | ICD-10-CM | POA: Insufficient documentation

## 2015-01-27 DIAGNOSIS — T148XXA Other injury of unspecified body region, initial encounter: Secondary | ICD-10-CM

## 2015-01-27 MED ORDER — BACITRACIN ZINC 500 UNIT/GM EX OINT
TOPICAL_OINTMENT | CUTANEOUS | Status: DC
Start: 2015-01-27 — End: 2015-01-28
  Filled 2015-01-27: qty 0.9

## 2015-01-27 NOTE — ED Notes (Signed)
Pt was cutting limbs and the electric saw accidentally cut his left knee; pt has small laceration to left knee, no bleeding noted

## 2015-01-27 NOTE — ED Provider Notes (Addendum)
CSN: [id number]     Arrival date & time 01/27/15  2052 History  This chart was scribed for Eugene AlbeIva Haroun Cotham, MD by Roxy Cedarhandni Bhalodia, ED Scribe. This patient was seen in room APA09/APA09 and the patient's care was started at 11:23 PM.   Chief Complaint  Patient presents with  . Laceration   Patient is a 24 y.o. male presenting with skin laceration. The history is provided by the patient. No language interpreter was used.  Laceration Location:  Leg Leg laceration location:  L lower leg Length (cm):  1.5 Foreign body present:  No foreign bodies Relieved by:  Nothing Worsened by:  Nothing tried Ineffective treatments:  None tried Tetanus status:  Unknown  HPI Comments: Eugene Hicks is a 24 y.o. male with a PMHx of ADHD, back pain, shoulder pain and asthma, who presents to the Emergency Department complaining of a laceration to left knee onset 9 hours ago. Patient states that he was cutting tree limbs with an electric saw and accidentally cut his lef knee. Patient states that it has been more than 10 years since his last tetanus shot. Wound is not currently bleeding.  Past Medical History  Diagnosis Date  . ADHD (attention deficit hyperactivity disorder)   . Back pain   . Shoulder pain   . Asthma    Past Surgical History  Procedure Laterality Date  . Upper gi endoscopy     Family History  Problem Relation Age of Onset  . Stroke Father   . Seizures Mother   . Seizures Brother    History  Substance Use Topics  . Smoking status: Never Smoker   . Smokeless tobacco: Never Used  . Alcohol Use: No   Lives at home Lives with parents  Review of Systems  Skin: Positive for wound.  All other systems reviewed and are negative.  Allergies  Codeine; Lactose intolerance (gi); Percocet; and Penicillins  Home Medications   Prior to Admission medications   Medication Sig Start Date End Date Taking? Authorizing Provider  ALPRAZolam Prudy Feeler(XANAX) 1 MG tablet Take 1 tablet twice daily for 7  days, then take 1/2 tablet twice daily for 14 days, then 1/2 tablet once daily for 7 days. Patient taking differently: Take 1 mg by mouth 2 (two) times daily.  10/28/14  Yes Raynelle FanningJulie Idol, PA-C  clindamycin (CLEOCIN) 150 MG capsule Take 1 capsule (150 mg total) by mouth every 6 (six) hours. 01/24/15  Yes Raynelle FanningJulie Idol, PA-C  fluticasone (FLONASE) 50 MCG/ACT nasal spray Place 2 sprays into the nose daily.     Yes Historical Provider, MD  ibuprofen (ADVIL,MOTRIN) 400 MG tablet Take 1 tablet (400 mg total) by mouth every 6 (six) hours as needed. 01/24/15  Yes Burgess AmorJulie Idol, PA-C  lithium carbonate 300 MG capsule Take 300-600 mg by mouth 2 (two) times daily. Take one capsule (300mg ) every day in the morning, and take two capsules (600mg ) every day at bedtime   Yes Historical Provider, MD  propranolol (INNOPRAN XL) 80 MG 24 hr capsule Take 80 mg by mouth daily.    Yes Historical Provider, MD  QUEtiapine (SEROQUEL) 300 MG tablet Take 300 mg by mouth every evening.    Yes Historical Provider, MD  cyclobenzaprine (FLEXERIL) 10 MG tablet Take 1 tablet (10 mg total) by mouth 2 (two) times daily as needed for muscle spasms. Patient not taking: Reported on 01/24/2015 11/09/14   Janne NapoleonHope M Neese, NP  EPINEPHrine (EPI-PEN) 0.3 mg/0.3 mL SOAJ injection Inject 0.3 mLs (0.3 mg  total) into the muscle once. 07/26/13   Donnetta Hutching, MD  famotidine (PEPCID) 20 MG tablet Take 1 tablet (20 mg total) by mouth 2 (two) times daily. Patient not taking: Reported on 08/26/2014 03/06/14   [name], NP  loperamide (IMODIUM A-D) 2 MG tablet Take 1 tablet (2 mg total) by mouth 4 (four) times daily as needed for diarrhea or loose stools. Patient not taking: Reported on 11/17/2014 11/04/14   Vanetta Mulders, MD  meclizine (ANTIVERT) 25 MG tablet Take 1 tablet (25 mg total) by mouth 2 (two) times daily as needed for dizziness. Patient not taking: Reported on 11/17/2014 10/30/14   Blane Ohara, MD   Triage Vitals: BP 118/64 mmHg  Pulse 71  Temp(Src) 98.1 F  (36.7 C) (Oral)  Resp 18  Ht  (1.626 m)  Wt 133 lb (60.328 kg)  BMI 22.82 kg/m2  SpO2 100%  Vital signs normal    Physical Exam  Constitutional: He is oriented to person, place, and time. He appears well-developed and well-nourished.  Non-toxic appearance. He does not appear ill. No distress.  Appears mentally slow  HENT:  Head: Normocephalic and atraumatic.  Right Ear: External ear normal.  Left Ear: External ear normal.  Nose: Nose normal. No mucosal edema or rhinorrhea.  Mouth/Throat: Oropharynx is clear and moist and mucous membranes are normal. No dental abscesses or uvula swelling.  acne  Eyes: Conjunctivae and EOM are normal. Pupils are equal, round, and reactive to light.  Neck: Normal range of motion and full passive range of motion without pain. Neck supple.  Cardiovascular: Exam reveals friction rub.   Pulmonary/Chest: Effort normal. No respiratory distress. He has no rhonchi. He exhibits no crepitus.  Abdominal: Normal appearance.  Musculoskeletal: Normal range of motion. He exhibits no edema or tenderness.  Moves all extremities well.   Neurological: He is alert and oriented to person, place, and time. He has normal strength. No cranial nerve deficit.  Skin: Skin is warm and dry. Laceration noted. No rash noted. No erythema. No pallor.  1.5 cm superficial lac that does not separate on the left proximal lower extremity inferior to the knee. Pt is still wearing the same pants when he was injured and there is no blood on his pants noted. There is no dried blood on his leg (MOP states "it bled a lot".   Psychiatric: He has a normal mood and affect. His speech is normal and behavior is normal. His mood appears not anxious.  Nursing note and vitals reviewed.    ED Course  Procedures (including critical care time)  DIAGNOSTIC STUDIES: Oxygen Saturation is 100% on RA, normal by my interpretation.    COORDINATION OF CARE: 11:25 PM- Discussed plans to give patient  tetanus shot. Discussed that the laceration will not need any sutures. Will discharge patient. Pt advised of plan for treatment and pt agrees.  On Review in his chart, patient had TWO tetanus boosters in 2015, one in March and one in July.   Labs Review Labs Reviewed - No data to display  Imaging Review No results found.   EKG Interpretation None     MDM   Final diagnoses:  Superficial laceration   Plan discharge  Eugene Albe, MD, FACEP   I personally performed the services described in this documentation, which was scribed in my presence. The recorded information has been reviewed and considered.  Eugene Albe, MD, Concha Pyo, MD 01/27/15 0454  Eugene Albe, MD 01/27/15 [phone]

## 2015-01-27 NOTE — Discharge Instructions (Signed)
Keep the abrasions clean and dry. Clean with soap and water daily. Use triple antibiotic ointment on the wound. You have TWO tetanus boosters in 2015, you DO NOT need another until 2025.

## 2015-03-02 ENCOUNTER — Emergency Department (HOSPITAL_COMMUNITY)
Admission: EM | Admit: 2015-03-02 | Discharge: 2015-03-02 | Disposition: A | Discharge status: Home / Self Care | Payer: Medicaid Other | Attending: Emergency Medicine | Admitting: Emergency Medicine

## 2015-03-02 ENCOUNTER — Encounter (HOSPITAL_COMMUNITY): Payer: Self-pay | Admitting: Emergency Medicine

## 2015-03-02 DIAGNOSIS — Z7951 Long term (current) use of inhaled steroids: Secondary | ICD-10-CM | POA: Insufficient documentation

## 2015-03-02 DIAGNOSIS — Z79899 Other long term (current) drug therapy: Secondary | ICD-10-CM | POA: Diagnosis not present

## 2015-03-02 DIAGNOSIS — H6691 Otitis media, unspecified, right ear: Secondary | ICD-10-CM | POA: Diagnosis not present

## 2015-03-02 DIAGNOSIS — Z8659 Personal history of other mental and behavioral disorders: Secondary | ICD-10-CM | POA: Diagnosis not present

## 2015-03-02 DIAGNOSIS — H9201 Otalgia, right ear: Secondary | ICD-10-CM | POA: Diagnosis present

## 2015-03-02 DIAGNOSIS — Z880 Allergy status to penicillin: Secondary | ICD-10-CM | POA: Diagnosis not present

## 2015-03-02 DIAGNOSIS — J45909 Unspecified asthma, uncomplicated: Secondary | ICD-10-CM | POA: Insufficient documentation

## 2015-03-02 MED ORDER — AZITHROMYCIN 250 MG PO TABS
500.0000 mg | ORAL_TABLET | Freq: Once | ORAL | Status: AC
Start: 2015-03-02 — End: 2015-03-02
  Administered 2015-03-02: 500 mg via ORAL
  Filled 2015-03-02: qty 2

## 2015-03-02 MED ORDER — AZITHROMYCIN 250 MG PO TABS: ORAL_TABLET | ORAL | Status: DC

## 2015-03-02 MED ORDER — ACETAMINOPHEN 500 MG PO TABS
1000.0000 mg | ORAL_TABLET | Freq: Once | ORAL | Status: DC
  Filled 2015-03-02: qty 2

## 2015-03-02 NOTE — Discharge Instructions (Signed)
Start remainder of antibiotic tomorrow evening. Follow-up your doctor.

## 2015-03-02 NOTE — ED Provider Notes (Signed)
CSN: [id number]     Arrival date & time 03/02/15  2008 History  This chart was scribed for Donnetta HutchingBrian Galit Urich, MD by Modena JanskyAlbert Thayil, ED Scribe. This patient was seen in room APFT23/APFT23 and the patient's care was started at 8:32 PM.  Chief Complaint  Patient presents with  . Otalgia   The history is provided by the patient. No language interpreter was used.    HPI Comments: Doristine CounterSamuel B Ricciuti is a 24 y.o. male who presents to the Emergency Department complaining of constant moderate right ear pain that started 4 days ago. He reports that he has been having ear pain the past 4 days ago that is concerning. He states no modifying factors.   Past Medical History  Diagnosis Date  . ADHD (attention deficit hyperactivity disorder)   . Back pain   . Shoulder pain   . Asthma    Past Surgical History  Procedure Laterality Date  . Upper gi endoscopy     Family History  Problem Relation Age of Onset  . Stroke Father   . Seizures Mother   . Seizures Brother    History  Substance Use Topics  . Smoking status: Never Smoker   . Smokeless tobacco: Never Used  . Alcohol Use: No    Review of Systems A complete 10 system review of systems was obtained and all systems are negative except as noted in the HPI and PMH.   Allergies  Codeine; Lactose intolerance (gi); Percocet; and Penicillins  Home Medications   Prior to Admission medications   Medication Sig Start Date End Date Taking? Authorizing Provider  ALPRAZolam Prudy Feeler(XANAX) 1 MG tablet Take 1 tablet twice daily for 7 days, then take 1/2 tablet twice daily for 14 days, then 1/2 tablet once daily for 7 days. Patient taking differently: Take 1 mg by mouth 2 (two) times daily.  10/28/14   Burgess AmorJulie Idol, PA-C  azithromycin (ZITHROMAX) 250 MG tablet 1 tablet daily for 4 days starting Sunday evening 03/02/15   Donnetta HutchingBrian Alayia Meggison, MD  clindamycin (CLEOCIN) 150 MG capsule Take 1 capsule (150 mg total) by mouth every 6 (six) hours. 01/24/15   Burgess AmorJulie Idol, PA-C   cyclobenzaprine (FLEXERIL) 10 MG tablet Take 1 tablet (10 mg total) by mouth 2 (two) times daily as needed for muscle spasms. Patient not taking: Reported on 01/24/2015 11/09/14   Janne NapoleonHope M Neese, NP  EPINEPHrine (EPI-PEN) 0.3 mg/0.3 mL SOAJ injection Inject 0.3 mLs (0.3 mg total) into the muscle once. 07/26/13   Donnetta HutchingBrian Anitta Tenny, MD  famotidine (PEPCID) 20 MG tablet Take 1 tablet (20 mg total) by mouth 2 (two) times daily. Patient not taking: Reported on 08/26/2014 03/06/14   Janne NapoleonHope M Neese, NP  fluticasone [facility](FLONASE) 50 MCG/ACT nasal spray Place 2 sprays into the nose daily.      Historical Provider, MD  ibuprofen (ADVIL,MOTRIN) 400 MG tablet Take 1 tablet (400 mg total) by mouth every 6 (six) hours as needed. 01/24/15   Burgess AmorJulie Idol, PA-C  lithium carbonate 300 MG capsule Take 300-600 mg by mouth 2 (two) times daily. Take one capsule (300mg ) every day in the morning, and take two capsules (600mg ) every day at bedtime    Historical Provider, MD  loperamide (IMODIUM A-D) 2 MG tablet Take 1 tablet (2 mg total) by mouth 4 (four) times daily as needed for diarrhea or loose stools. Patient not taking: Reported on 11/17/2014 11/04/14   Vanetta MuldersScott Zackowski, MD  meclizine (ANTIVERT) 25 MG tablet Take 1 tablet (25 mg total)  by mouth 2 (two) times daily as needed for dizziness. Patient not taking: Reported on 11/17/2014 10/30/14   Blane OharaJoshua Zavitz, MD  propranolol (INNOPRAN XL) 80 MG 24 hr capsule Take 80 mg by mouth daily.     Historical Provider, MD  QUEtiapine (SEROQUEL) 300 MG tablet Take 300 mg by mouth every evening.     Historical Provider, MD   BP 109/62 mmHg  Pulse 55  Temp(Src) 98.1 F (36.7 C) (Oral)  Resp 20  Ht 5\' 8"  (1.727 m)  Wt 135 lb (61.236 kg)  BMI 20.53 kg/m2  SpO2 100% Physical Exam  Constitutional: He is oriented to person, place, and time. He appears well-developed and well-nourished.  HENT:  Head: Normocephalic and atraumatic.  Right TM has minimal erythema.  Eyes: Conjunctivae and EOM are normal.  Pupils are equal, round, and reactive to light.  Neck: Normal range of motion. Neck supple.  Cardiovascular: Normal rate and regular rhythm.   Pulmonary/Chest: Effort normal and breath sounds normal.  Abdominal: Soft. Bowel sounds are normal.  Musculoskeletal: Normal range of motion.  Neurological: He is alert and oriented to person, place, and time.  Skin: Skin is warm and dry.  Psychiatric: He has a normal mood and affect. His behavior is normal.  Nursing note and vitals reviewed.   ED Course  Procedures (including critical care time) DIAGNOSTIC STUDIES: Oxygen Saturation is 100% on RA, normal by my interpretation.    COORDINATION OF CARE: 8:36 PM- Pt advised of plan for treatment which includes antibiotic medication and pt agrees.  Labs Review Labs Reviewed - No data to display  Imaging Review No results found.   EKG Interpretation None      MDM   Final diagnoses:  Acute right otitis media, recurrence not specified, unspecified otitis media type   Patient is in no acute distress. Right tympanic membrane slightly inflamed. Rx Zithromax.  I personally performed the services described in this documentation, which was scribed in my presence. The recorded information has been reviewed and is accurate.     Donnetta HutchingBrian Sunjai Levandoski, MD 03/02/15 2118

## 2015-03-02 NOTE — ED Notes (Signed)
Patient complaining of right earache x2 days.

## 2015-04-13 ENCOUNTER — Encounter (HOSPITAL_COMMUNITY): Payer: Self-pay

## 2015-04-13 ENCOUNTER — Emergency Department (HOSPITAL_COMMUNITY)
Admission: EM | Admit: 2015-04-13 | Discharge: 2015-04-13 | Disposition: A | Discharge status: Home / Self Care | Payer: Medicaid Other | Attending: Emergency Medicine | Admitting: Emergency Medicine

## 2015-04-13 ENCOUNTER — Emergency Department (HOSPITAL_COMMUNITY): Payer: Medicaid Other

## 2015-04-13 DIAGNOSIS — Y9344 Activity, trampolining: Secondary | ICD-10-CM | POA: Insufficient documentation

## 2015-04-13 DIAGNOSIS — Y9389 Activity, other specified: Secondary | ICD-10-CM | POA: Diagnosis not present

## 2015-04-13 DIAGNOSIS — Y998 Other external cause status: Secondary | ICD-10-CM | POA: Insufficient documentation

## 2015-04-13 DIAGNOSIS — W098XXA Fall on or from other playground equipment, initial encounter: Secondary | ICD-10-CM | POA: Insufficient documentation

## 2015-04-13 DIAGNOSIS — J45909 Unspecified asthma, uncomplicated: Secondary | ICD-10-CM | POA: Diagnosis not present

## 2015-04-13 DIAGNOSIS — S299XXA Unspecified injury of thorax, initial encounter: Secondary | ICD-10-CM | POA: Insufficient documentation

## 2015-04-13 DIAGNOSIS — Z79899 Other long term (current) drug therapy: Secondary | ICD-10-CM | POA: Diagnosis not present

## 2015-04-13 DIAGNOSIS — M940 Chondrocostal junction syndrome [Tietze]: Secondary | ICD-10-CM | POA: Insufficient documentation

## 2015-04-13 DIAGNOSIS — Z792 Long term (current) use of antibiotics: Secondary | ICD-10-CM | POA: Insufficient documentation

## 2015-04-13 DIAGNOSIS — Y9289 Other specified places as the place of occurrence of the external cause: Secondary | ICD-10-CM | POA: Diagnosis not present

## 2015-04-13 DIAGNOSIS — Z7951 Long term (current) use of inhaled steroids: Secondary | ICD-10-CM | POA: Diagnosis not present

## 2015-04-13 DIAGNOSIS — Z8659 Personal history of other mental and behavioral disorders: Secondary | ICD-10-CM | POA: Insufficient documentation

## 2015-04-13 MED ORDER — IBUPROFEN 400 MG PO TABS: 400.0000 mg | ORAL_TABLET | Freq: Four times a day (QID) | ORAL | Status: DC | PRN

## 2015-04-13 MED ORDER — IBUPROFEN 800 MG PO TABS
800.0000 mg | ORAL_TABLET | Freq: Once | ORAL | Status: AC
  Administered 2015-04-13: 800 mg via ORAL

## 2015-04-13 NOTE — Discharge Instructions (Signed)

## 2015-04-13 NOTE — ED Provider Notes (Signed)
CSN: [id number]     Arrival date & time 04/13/15  2032 History  This chart was scribed for Fayrene Helper, PA-C working with Hermilo Jester, DO by Evon Slack, ED Scribe. This patient was seen in room APFT21/APFT21 and the patient's care was started at 8:52 PM.      Chief Complaint  Patient presents with  . Rib Injury   The history is provided by the patient and a parent. No language interpreter was used.   HPI Comments: Eugene Hicks is a 24 y.o. male who presents to the Emergency Department complaining of new sharp left sided rib pain onset today 20 minutes PTA. Pt states that he was playing on a trampoline and landed on his left side on the outside ring of the trampoline. Pt doesn't report any medications PTA. Denies head injury or LOC. Pt states that the pain is worse with deep breathing. Pt denies SOB.     Past Medical History  Diagnosis Date  . ADHD (attention deficit hyperactivity disorder)   . Back pain   . Shoulder pain   . Asthma    Past Surgical History  Procedure Laterality Date  . Upper gi endoscopy     Family History  Problem Relation Age of Onset  . Stroke Father   . Seizures Mother   . Seizures Brother    History  Substance Use Topics  . Smoking status: Never Smoker   . Smokeless tobacco: Never Used  . Alcohol Use: No    Review of Systems  Respiratory: Negative for shortness of breath.   Musculoskeletal: Positive for arthralgias. Negative for back pain.  Neurological: Negative for syncope.      Allergies  Codeine; Lactose intolerance (gi); Percocet; and Penicillins  Home Medications   Prior to Admission medications   Medication Sig Start Date End Date Taking? Authorizing Provider  ALPRAZolam Prudy Feeler) 1 MG tablet Take 1 tablet twice daily for 7 days, then take 1/2 tablet twice daily for 14 days, then 1/2 tablet once daily for 7 days. Patient taking differently: Take 1 mg by mouth 2 (two) times daily.  10/28/14   Burgess Amor, PA-C  azithromycin  (ZITHROMAX) 250 MG tablet 1 tablet daily for 4 days starting Sunday evening 03/02/15   Donnetta Hutching, MD  clindamycin (CLEOCIN) 150 MG capsule Take 1 capsule (150 mg total) by mouth every 6 (six) hours. 01/24/15   Burgess Amor, PA-C  cyclobenzaprine (FLEXERIL) 10 MG tablet Take 1 tablet (10 mg total) by mouth 2 (two) times daily as needed for muscle spasms. Patient not taking: Reported on 01/24/2015 11/09/14   [name], NP  EPINEPHrine (EPI-PEN) 0.3 mg/0.3 mL SOAJ injection Inject 0.3 mLs (0.3 mg total) into the muscle once. 07/26/13   Donnetta Hutching, MD  famotidine (PEPCID) 20 MG tablet Take 1 tablet (20 mg total) by mouth 2 (two) times daily. Patient not taking: Reported on 08/26/2014 03/06/14   [name], NP  fluticasone [facility]) 50 MCG/ACT nasal spray Place 2 sprays into the nose daily.      Historical Provider, MD  ibuprofen (ADVIL,MOTRIN) 400 MG tablet Take 1 tablet (400 mg total) by mouth every 6 (six) hours as needed. 01/24/15   Burgess Amor, PA-C  lithium carbonate 300 MG capsule Take 300-600 mg by mouth 2 (two) times daily. Take one capsule ( ) every day in the morning, and take two capsules ( ) every day at bedtime    Historical Provider, MD  loperamide (IMODIUM A-D) 2 MG tablet Take  1 tablet (2 mg total) by mouth 4 (four) times daily as needed for diarrhea or loose stools. Patient not taking: Reported on 11/17/2014 11/04/14   Vanetta Mulders, MD  meclizine (ANTIVERT) 25 MG tablet Take 1 tablet (25 mg total) by mouth 2 (two) times daily as needed for dizziness. Patient not taking: Reported on 11/17/2014 10/30/14   Blane Ohara, MD  propranolol (INNOPRAN XL) 80 MG 24 hr capsule Take 80 mg by mouth daily.     Historical Provider, MD  QUEtiapine (SEROQUEL) 300 MG tablet Take 300 mg by mouth every evening.     Historical Provider, MD   BP 115/66 mmHg  Pulse 75  Temp(Src) 98.4 F (36.9 C) (Oral)  Resp 18  Ht 5\' 7"  (1.702 m)  Wt 140 lb (63.504 kg)  BMI 21.92 kg/m2  SpO2 100%   Physical Exam   Constitutional: He is oriented to person, place, and time. He appears well-developed and well-nourished. No distress.  HENT:  Head: Normocephalic and atraumatic.  Eyes: Conjunctivae and EOM are normal.  Neck: Neck supple. No tracheal deviation present.  Cardiovascular: Normal rate, regular rhythm and normal heart sounds.   No murmur heard. Pulmonary/Chest: Effort normal and breath sounds normal. No respiratory distress. He has no decreased breath sounds.  Breath sounds presents in all fields. Pain to left lateral posterior ribs with palpitations, no crepitus or emphysema.   Abdominal: Soft. There is no tenderness.  Musculoskeletal: Normal range of motion. He exhibits tenderness.  Neurological: He is alert and oriented to person, place, and time.  Skin: Skin is warm and dry.  Psychiatric: He has a normal mood and affect. His behavior is normal.  Nursing note and vitals reviewed.   ED Course  Procedures (including critical care time) DIAGNOSTIC STUDIES: Oxygen Saturation is 100% on RA, normal by my interpretation.    COORDINATION OF CARE: 8:57 PM-Discussed treatment plan with pt at bedside and pt agreed to plan. Ribs injury.  No abdominal pain to suggest kidney or splenic injury.  Xray ordered.  NSAIDs given  10:04 PM Xray shows no ribs fx or PTX.  RICE therapy discussed.      Labs Review Labs Reviewed - No data to display  Imaging Review Dg Ribs Unilateral W/chest Left  04/13/2015   CLINICAL DATA:  Jumping on trampoline fell on left side, left rib pain  EXAM: LEFT RIBS AND CHEST - 3+ VIEW  COMPARISON:  Multiple previous studies including 11/04/2014  FINDINGS: Cardiac silhouette magnified by technique. Vascular pattern normal and lungs clear with no effusion or pneumothorax.  IMPRESSION: Negative.   Electronically Signed   By: Esperanza Heir M.D.   On: 04/13/2015 21:57     EKG Interpretation None      MDM   Final diagnoses:  Costochondritis, acute    BP 115/66 mmHg   Pulse 75  Temp(Src) 98.4 F (36.9 C) (Oral)  Resp 18  Ht 5\' 7"  (1.702 m)  Wt 140 lb (63.504 kg)  BMI 21.92 kg/m2  SpO2 100%  I have reviewed nursing notes and vital signs. I personally viewed the imaging tests through PACS system and agrees with radiologist's intepretation I reviewed available ER/hospitalization records through the EMR   I personally performed the services described in this documentation, which was scribed in my presence. The recorded information has been reviewed and is accurate.       Fayrene Helper, PA-C 04/13/15 2205  Bernon Jester, DO 04/14/15 1430

## 2015-04-13 NOTE — ED Notes (Signed)
Patient was jumping on trampoline and fell to left side. Patient c/o left rib pain.

## 2015-04-26 ENCOUNTER — Encounter (HOSPITAL_COMMUNITY): Payer: Self-pay | Admitting: Emergency Medicine

## 2015-04-26 ENCOUNTER — Emergency Department (HOSPITAL_COMMUNITY)
Admission: EM | Admit: 2015-04-26 | Discharge: 2015-04-26 | Disposition: A | Discharge status: Home / Self Care | Payer: Medicaid Other | Attending: Emergency Medicine | Admitting: Emergency Medicine

## 2015-04-26 DIAGNOSIS — E876 Hypokalemia: Secondary | ICD-10-CM | POA: Diagnosis not present

## 2015-04-26 DIAGNOSIS — R1084 Generalized abdominal pain: Secondary | ICD-10-CM | POA: Diagnosis not present

## 2015-04-26 DIAGNOSIS — Z7951 Long term (current) use of inhaled steroids: Secondary | ICD-10-CM | POA: Insufficient documentation

## 2015-04-26 DIAGNOSIS — Z79899 Other long term (current) drug therapy: Secondary | ICD-10-CM | POA: Insufficient documentation

## 2015-04-26 DIAGNOSIS — J45909 Unspecified asthma, uncomplicated: Secondary | ICD-10-CM | POA: Insufficient documentation

## 2015-04-26 DIAGNOSIS — Z880 Allergy status to penicillin: Secondary | ICD-10-CM | POA: Diagnosis not present

## 2015-04-26 DIAGNOSIS — Z8659 Personal history of other mental and behavioral disorders: Secondary | ICD-10-CM | POA: Insufficient documentation

## 2015-04-26 DIAGNOSIS — R531 Weakness: Secondary | ICD-10-CM | POA: Diagnosis present

## 2015-04-26 LAB — I-STAT CHEM 8, ED
BUN: 8 mg/dL (ref 6–20)
CALCIUM ION: 1.27 mmol/L — AB (ref 1.12–1.23)
CREATININE: 1 mg/dL (ref 0.61–1.24)
Chloride: 105 mmol/L (ref 101–111)
Glucose, Bld: 97 mg/dL (ref 65–99)
HCT: 39 % (ref 39.0–52.0)
Hemoglobin: 13.3 g/dL (ref 13.0–17.0)
Potassium: 3.3 mmol/L — ABNORMAL LOW (ref 3.5–5.1)
Sodium: 143 mmol/L (ref 135–145)
TCO2: 22 mmol/L (ref 0–100)

## 2015-04-26 MED ORDER — SODIUM CHLORIDE 0.9 % IV BOLUS (SEPSIS)
1000.0000 mL | Freq: Once | INTRAVENOUS | Status: AC
Start: 2015-04-26 — End: 2015-04-26
  Administered 2015-04-26: 1000 mL via INTRAVENOUS

## 2015-04-26 MED ORDER — POTASSIUM CHLORIDE CRYS ER 20 MEQ PO TBCR: 40.0000 meq | EXTENDED_RELEASE_TABLET | Freq: Once | ORAL | Status: DC

## 2015-04-26 NOTE — ED Notes (Signed)
Discharge instructions given, pt demonstrated teach back and verbal understanding. No concerns voiced.  

## 2015-04-26 NOTE — ED Provider Notes (Signed)
CSN: [id number]     Arrival date & time 04/26/15  2038 History  This chart was scribed for Margarita Grizzleanielle Bela Nyborg, MD by Evon Slackerrance Branch, ED Scribe. This patient was seen in room APA08/APA08 and the patient's care was started at 8:55 PM.     Chief Complaint  Patient presents with  . Abdominal Pain  . Weakness   The history is provided by the patient. No language interpreter was used.   HPI Comments: Eugene Hicks is a 24 y.o. male who presents to the Emergency Department complaining of feeling dehydrated that he describes as weakness onset today. Pt states that he has been mowing yards all day. Pt states that he has been drinking fluids all day today with no relief. Pt denies any medications PTA. Pt states that there is slight improvement in his symptoms having receiving IV fluids here in the ED.   Past Medical History  Diagnosis Date  . ADHD (attention deficit hyperactivity disorder)   . Back pain   . Shoulder pain   . Asthma    Past Surgical History  Procedure Laterality Date  . Upper gi endoscopy     Family History  Problem Relation Age of Onset  . Stroke Father   . Seizures Mother   . Seizures Brother    History  Substance Use Topics  . Smoking status: Never Smoker   . Smokeless tobacco: Never Used  . Alcohol Use: No    Review of Systems  Neurological: Positive for weakness.  All other systems reviewed and are negative.     Allergies  Codeine; Lactose intolerance (gi); Percocet; and Penicillins  Home Medications   Prior to Admission medications   Medication Sig Start Date End Date Taking? Authorizing Provider  ALPRAZolam Prudy Feeler(XANAX) 1 MG tablet Take 1 tablet twice daily for 7 days, then take 1/2 tablet twice daily for 14 days, then 1/2 tablet once daily for 7 days. Patient taking differently: Take 1 mg by mouth 2 (two) times daily.  10/28/14   Burgess AmorJulie Idol, PA-C  azithromycin (ZITHROMAX) 250 MG tablet 1 tablet daily for 4 days starting Sunday evening 03/02/15   Donnetta HutchingBrian Cook, MD   clindamycin (CLEOCIN) 150 MG capsule Take 1 capsule (150 mg total) by mouth every 6 (six) hours. 01/24/15   Burgess AmorJulie Idol, PA-C  cyclobenzaprine (FLEXERIL) 10 MG tablet Take 1 tablet (10 mg total) by mouth 2 (two) times daily as needed for muscle spasms. Patient not taking: Reported on 01/24/2015 11/09/14   Janne NapoleonHope M Neese, NP  EPINEPHrine (EPI-PEN) 0.3 mg/0.3 mL SOAJ injection Inject 0.3 mLs (0.3 mg total) into the muscle once. 07/26/13   Donnetta HutchingBrian Cook, MD  famotidine (PEPCID) 20 MG tablet Take 1 tablet (20 mg total) by mouth 2 (two) times daily. Patient not taking: Reported on 08/26/2014 03/06/14   Janne NapoleonHope M Neese, NP  fluticasone [facility](FLONASE) 50 MCG/ACT nasal spray Place 2 sprays into the nose daily.      Historical Provider, MD  ibuprofen (ADVIL,MOTRIN) 400 MG tablet Take 1 tablet (400 mg total) by mouth every 6 (six) hours as needed. 04/13/15   Fayrene HelperBowie Tran, PA-C  lithium carbonate 300 MG capsule Take 300-600 mg by mouth 2 (two) times daily. Take one capsule (300mg ) every day in the morning, and take two capsules (600mg ) every day at bedtime    Historical Provider, MD  loperamide (IMODIUM A-D) 2 MG tablet Take 1 tablet (2 mg total) by mouth 4 (four) times daily as needed for diarrhea or loose stools. Patient  not taking: Reported on 11/17/2014 11/04/14   Vanetta Mulders, MD  meclizine (ANTIVERT) 25 MG tablet Take 1 tablet (25 mg total) by mouth 2 (two) times daily as needed for dizziness. Patient not taking: Reported on 11/17/2014 10/30/14   Blane Ohara, MD  propranolol (INNOPRAN XL) 80 MG 24 hr capsule Take 80 mg by mouth daily.     Historical Provider, MD  QUEtiapine (SEROQUEL) 300 MG tablet Take 300 mg by mouth every evening.     Historical Provider, MD   BP 124/72 mmHg  Pulse 84  Temp(Src) 98.1 F (36.7 C) (Oral)  Resp 16  SpO2 99%   Physical Exam  Constitutional: He is oriented to person, place, and time. He appears well-developed and well-nourished. No distress.  HENT:  Head: Normocephalic and atraumatic.   Eyes: Conjunctivae and EOM are normal.  Neck: Neck supple. No tracheal deviation present.  Cardiovascular: Normal rate.   Pulmonary/Chest: Effort normal. No respiratory distress.  Musculoskeletal: Normal range of motion.  Neurological: He is alert and oriented to person, place, and time.  Skin: Skin is warm and dry.  Psychiatric: He has a normal mood and affect. His behavior is normal.  Nursing note and vitals reviewed.   ED Course  Procedures (including critical care time) DIAGNOSTIC STUDIES: Oxygen Saturation is 99% on RA, normal by my interpretation.    COORDINATION OF CARE: 9:08 PM-Discussed treatment plan with pt at bedside and pt agreed to plan.     Labs Review Labs Reviewed  I-STAT CHEM 8, ED - Abnormal; Notable for the following:    Potassium 3.3 (*)    Calcium, Ion 1.27 (*)    All other components within normal limits    Imaging Review No results found.   EKG Interpretation None      MDM   Final diagnoses:  Hypokalemia      I personally performed the services described in this documentation, which was scribed in my presence. The recorded information has been reviewed and considered.       Margarita Grizzle, MD 04/26/15 [phone]

## 2015-04-26 NOTE — Discharge Instructions (Signed)
Hypokalemia Hypokalemia means that the amount of potassium in the blood is lower than normal.Potassium is a chemical, called an electrolyte, that helps regulate the amount of fluid in the body. It also stimulates muscle contraction and helps nerves function properly.Most of the body's potassium is inside of cells, and only a very small amount is in the blood. Because the amount in the blood is so small, minor changes can be life-threatening. CAUSES  Antibiotics.  Diarrhea or vomiting.  Using laxatives too much, which can cause diarrhea.  Chronic kidney disease.  Water pills (diuretics).  Eating disorders (bulimia).  Low magnesium level.  Sweating a lot. SIGNS AND SYMPTOMS  Weakness.  Constipation.  Fatigue.  Muscle cramps.  Mental confusion.  Skipped heartbeats or irregular heartbeat (palpitations).  Tingling or numbness. DIAGNOSIS  Your health care provider can diagnose hypokalemia with blood tests. In addition to checking your potassium level, your health care provider may also check other lab tests. TREATMENT Hypokalemia can be treated with potassium supplements taken by mouth or adjustments in your current medicines. If your potassium level is very low, you may need to get potassium through a vein (IV) and be monitored in the hospital. A diet high in potassium is also helpful. Foods high in potassium are:  Nuts, such as peanuts and pistachios.  Seeds, such as sunflower seeds and pumpkin seeds.  Peas, lentils, and lima beans.  Whole grain and bran cereals and breads.  Fresh fruit and vegetables, such as apricots, avocado, bananas, cantaloupe, kiwi, oranges, tomatoes, asparagus, and potatoes.  Orange and tomato juices.  Red meats.  Fruit yogurt. HOME CARE INSTRUCTIONS  Take all medicines as prescribed by your health care provider.  Maintain a healthy diet by including nutritious food, such as fruits, vegetables, nuts, whole grains, and lean meats.  If  you are taking a laxative, be sure to follow the directions on the label. SEEK MEDICAL CARE IF:  Your weakness gets worse.  You feel your heart pounding or racing.  You are vomiting or having diarrhea.  You are diabetic and having trouble keeping your blood glucose in the normal range. SEEK IMMEDIATE MEDICAL CARE IF:  You have chest pain, shortness of breath, or dizziness.  You are vomiting or having diarrhea for more than 2 days.  You faint. MAKE SURE YOU:   Understand these instructions.  Will watch your condition.  Will get help right away if you are not doing well or get worse. Document Released: 10/05/2005 Document Revised: 07/26/2013 Document Reviewed: 04/07/2013 Exit[employer] Patient Information 2015 [employer]. This information is not intended to replace advice given to you by your health care provider. Make sure you discuss any questions you have with your health care provider.  Dehydration, Adult Dehydration means your body does not have as much fluid as it needs. Your kidneys, brain, and heart will not work properly without the right amount of fluids and salt.  HOME CARE  Ask your doctor how to replace body fluid losses (rehydrate).  Drink enough fluids to keep your pee (urine) clear or pale yellow.  Drink small amounts of fluids often if you feel sick to your stomach (nauseous) or throw up (vomit).  Eat like you normally do.  Avoid:  Foods or drinks high in sugar.  Bubbly (carbonated) drinks.  Juice.  Very hot or cold fluids.  Drinks with caffeine.  Fatty, greasy foods.  Alcohol.  Tobacco.  Eating too much.  Gelatin desserts.  Wash your hands to avoid spreading germs (bacteria,   viruses).  Only take medicine as told by your doctor.  Keep all doctor visits as told. GET HELP RIGHT AWAY IF:   You cannot drink something without throwing up.  You get worse even with treatment.  Your vomit has blood in it or looks greenish.  Your poop  (stool) has blood in it or looks black and tarry.  You have not peed in 6 to 8 hours.  You pee a small amount of very dark pee.  You have a fever.  You pass out (faint).  You have belly (abdominal) pain that gets worse or stays in one spot (localizes).  You have a rash, stiff neck, or bad headache.  You get easily annoyed, sleepy, or are hard to wake up.  You feel weak, dizzy, or very thirsty. MAKE SURE YOU:   Understand these instructions.  Will watch your condition.  Will get help right away if you are not doing well or get worse. Document Released: 08/01/2009 Document Revised: 12/28/2011 Document Reviewed: 05/25/2011 Exit[employer] Patient Information 2015 [employer]. This information is not intended to replace advice given to you by your health care provider. Make sure you discuss any questions you have with your health care provider.  

## 2015-04-26 NOTE — ED Notes (Signed)
Patient reports, "I was mowing two yards today, and I got very hot and dehydrated." Patient reports he is feeling very weak all over and is having generalized abdominal pain.

## 2015-04-26 NOTE — ED Notes (Signed)
Pt refused potassium pills stating he did not want to take our "big pills" because he had smaller pills at home. Documented refused.

## 2015-04-28 DIAGNOSIS — Y998 Other external cause status: Secondary | ICD-10-CM | POA: Insufficient documentation

## 2015-04-28 DIAGNOSIS — Y9389 Activity, other specified: Secondary | ICD-10-CM | POA: Diagnosis not present

## 2015-04-28 DIAGNOSIS — Z7951 Long term (current) use of inhaled steroids: Secondary | ICD-10-CM | POA: Diagnosis not present

## 2015-04-28 DIAGNOSIS — Y9289 Other specified places as the place of occurrence of the external cause: Secondary | ICD-10-CM | POA: Diagnosis not present

## 2015-04-28 DIAGNOSIS — W25XXXA Contact with sharp glass, initial encounter: Secondary | ICD-10-CM | POA: Diagnosis not present

## 2015-04-28 DIAGNOSIS — J45909 Unspecified asthma, uncomplicated: Secondary | ICD-10-CM | POA: Insufficient documentation

## 2015-04-28 DIAGNOSIS — S91322A Laceration with foreign body, left foot, initial encounter: Secondary | ICD-10-CM | POA: Diagnosis present

## 2015-04-28 DIAGNOSIS — S91341A Puncture wound with foreign body, right foot, initial encounter: Secondary | ICD-10-CM | POA: Insufficient documentation

## 2015-04-28 DIAGNOSIS — Z8659 Personal history of other mental and behavioral disorders: Secondary | ICD-10-CM | POA: Diagnosis not present

## 2015-04-28 DIAGNOSIS — Z880 Allergy status to penicillin: Secondary | ICD-10-CM | POA: Insufficient documentation

## 2015-04-28 DIAGNOSIS — Z79899 Other long term (current) drug therapy: Secondary | ICD-10-CM | POA: Insufficient documentation

## 2015-04-29 ENCOUNTER — Emergency Department (HOSPITAL_COMMUNITY)
Admission: EM | Admit: 2015-04-29 | Discharge: 2015-04-29 | Disposition: A | Discharge status: Home / Self Care | Payer: Medicaid Other | Attending: Emergency Medicine | Admitting: Emergency Medicine

## 2015-04-29 ENCOUNTER — Emergency Department (HOSPITAL_COMMUNITY): Payer: Medicaid Other

## 2015-04-29 ENCOUNTER — Encounter (HOSPITAL_COMMUNITY): Payer: Self-pay | Admitting: *Deleted

## 2015-04-29 DIAGNOSIS — S90851A Superficial foreign body, right foot, initial encounter: Secondary | ICD-10-CM

## 2015-04-29 MED ORDER — SULFAMETHOXAZOLE-TRIMETHOPRIM 800-160 MG PO TABS: 1.0000 | ORAL_TABLET | Freq: Two times a day (BID) | ORAL | Status: DC

## 2015-04-29 MED ORDER — SULFAMETHOXAZOLE-TRIMETHOPRIM 800-160 MG PO TABS
1.0000 | ORAL_TABLET | Freq: Once | ORAL | Status: AC
  Administered 2015-04-29: 1 via ORAL
  Filled 2015-04-29: qty 1

## 2015-04-29 MED ORDER — BUPIVACAINE HCL (PF) 0.5 % IJ SOLN
10.0000 mL | Freq: Once | INTRAMUSCULAR | Status: AC
  Administered 2015-04-29: 10 mL
  Filled 2015-04-29: qty 30

## 2015-04-29 NOTE — ED Notes (Signed)
Pt c/o glass in left foot; no bleeding noted at this time

## 2015-04-29 NOTE — ED Notes (Signed)
Pt signed out AMA & then EDP advised pt he did have glass in foot & needed to stay. Pt returned to room.

## 2015-04-29 NOTE — ED Notes (Signed)
   04/29/15 0250  Wound  Procedure explained? Yes  Wound Location and Size right foot  Wound Type Puncture  Wound Interventions Cleaned  Wash right heel w/ soap & water, then sure clens. Unable to feel foreign object seen on xray.

## 2015-04-29 NOTE — ED Notes (Signed)
Pt alert & oriented x4, stable gait. Patient given discharge instructions, paperwork & prescription(s). Patient  instructed to stop at the registration desk to finish any additional paperwork. Patient verbalized understanding. Pt left department w/ no further questions. 

## 2015-04-29 NOTE — ED Provider Notes (Signed)
CSN: [id number]     Arrival date & time 04/28/15  2339 History   First MD Initiated Contact with Patient 04/29/15 0304     Chief Complaint  Patient presents with  . Foreign Body     (Consider location/radiation/quality/duration/timing/severity/associated sxs/prior Treatment) HPI  Patient states tonight about 6 PM he was walking and a piece of glass went through the sole of his foot and he thinks he still has a piece of glass in his foot. He states when he walks it feels sharp.  Tetanus UTD  PCP Dr Durene Cal  Past Medical History  Diagnosis Date  . ADHD (attention deficit hyperactivity disorder)   . Back pain   . Shoulder pain   . Asthma    Past Surgical History  Procedure Laterality Date  . Upper gi endoscopy     Family History  Problem Relation Age of Onset  . Stroke Father   . Seizures Mother   . Seizures Brother    History  Substance Use Topics  . Smoking status: Never Smoker   . Smokeless tobacco: Never Used  . Alcohol Use: No  lives with parents  Review of Systems  All other systems reviewed and are negative.     Allergies  Codeine; Lactose intolerance (gi); Percocet; and Penicillins  Home Medications   Prior to Admission medications   Medication Sig Start Date End Date Taking? Authorizing Provider  ALPRAZolam Prudy Feeler) 1 MG tablet Take 1 tablet twice daily for 7 days, then take 1/2 tablet twice daily for 14 days, then 1/2 tablet once daily for 7 days. Patient taking differently: Take 1 mg by mouth 2 (two) times daily.  10/28/14   Burgess Amor, PA-C  azithromycin (ZITHROMAX) 250 MG tablet 1 tablet daily for 4 days starting Sunday evening 03/02/15   Donnetta Hutching, MD  clindamycin (CLEOCIN) 150 MG capsule Take 1 capsule (150 mg total) by mouth every 6 (six) hours. 01/24/15   Burgess Amor, PA-C  cyclobenzaprine (FLEXERIL) 10 MG tablet Take 1 tablet (10 mg total) by mouth 2 (two) times daily as needed for muscle spasms. Patient not taking: Reported on 01/24/2015 11/09/14    [name], NP  EPINEPHrine (EPI-PEN) 0.3 mg/0.3 mL SOAJ injection Inject 0.3 mLs (0.3 mg total) into the muscle once. 07/26/13   Donnetta Hutching, MD  famotidine (PEPCID) 20 MG tablet Take 1 tablet (20 mg total) by mouth 2 (two) times daily. Patient not taking: Reported on 08/26/2014 03/06/14   [name], NP  fluticasone [facility]) 50 MCG/ACT nasal spray Place 2 sprays into the nose daily.      Historical Provider, MD  ibuprofen (ADVIL,MOTRIN) 400 MG tablet Take 1 tablet (400 mg total) by mouth every 6 (six) hours as needed. 04/13/15   Fayrene Helper, PA-C  lithium carbonate 300 MG capsule Take 300-600 mg by mouth 2 (two) times daily. Take one capsule ( ) every day in the morning, and take two capsules ( ) every day at bedtime    Historical Provider, MD  loperamide (IMODIUM A-D) 2 MG tablet Take 1 tablet (2 mg total) by mouth 4 (four) times daily as needed for diarrhea or loose stools. Patient not taking: Reported on 11/17/2014 11/04/14   Vanetta Mulders, MD  meclizine (ANTIVERT) 25 MG tablet Take 1 tablet (25 mg total) by mouth 2 (two) times daily as needed for dizziness. Patient not taking: Reported on 11/17/2014 10/30/14   Blane Ohara, MD  propranolol (INNOPRAN XL) 80 MG 24 hr capsule Take 80 mg by  mouth daily.     Historical Provider, MD  QUEtiapine (SEROQUEL) 300 MG tablet Take 300 mg by mouth every evening.     Historical Provider, MD  sulfamethoxazole-trimethoprim (BACTRIM DS,SEPTRA DS) 800-160 MG per tablet Take 1 tablet by mouth 2 (two) times daily. 04/29/15   Devoria Albe, MD   BP 108/63 mmHg  Pulse 54  Temp(Src) 97.8 F (36.6 C) (Oral)  Resp 18  Ht 5\' 8"  (1.727 m)  Wt 140 lb (63.504 kg)  BMI 21.29 kg/m2  SpO2 100%  Vital signs normal except for bradycardia  Physical Exam  Constitutional: He is oriented to person, place, and time. He appears well-developed and well-nourished.  Non-toxic appearance. He does not appear ill. No distress.  HENT:  Head: Normocephalic and atraumatic.   Right Ear: External ear normal.  Left Ear: External ear normal.  Nose: Nose normal. No mucosal edema or rhinorrhea.  Mouth/Throat: Mucous membranes are normal. No dental abscesses or uvula swelling.  Eyes: Conjunctivae and EOM are normal. Pupils are equal, round, and reactive to light.  Neck: Normal range of motion and full passive range of motion without pain. Neck supple.  Pulmonary/Chest: Effort normal. No respiratory distress. He has no rhonchi. He exhibits no crepitus.  Abdominal: Normal appearance.  Musculoskeletal: Normal range of motion. He exhibits tenderness. He exhibits no edema.  Moves all extremities well.  On exam of patient's sole of his right foot he has a very tiny puncture wound in his heel. I do feel something in the soft tissue underneath it.  Neurological: He is alert and oriented to person, place, and time. He has normal strength. No cranial nerve deficit.  Skin: Skin is warm, dry and intact. No rash noted. No erythema. No pallor.  Has blackheads scattered on his face  Psychiatric: He has a normal mood and affect. His speech is normal and behavior is normal. His mood appears not anxious.  Seems mentally slow  Nursing note and vitals reviewed.   ED Course  Procedures (including critical care time) Medications  bupivacaine (MARCAINE) 0.5 % injection 10 mL (10 mLs Infiltration Given by Other 04/29/15 0433)  sulfamethoxazole-trimethoprim (BACTRIM DS,SEPTRA DS) 800-160 MG per tablet 1 tablet (1 tablet Oral Given 04/29/15 0450)    LACERATION REPAIR Performed by: Devoria Albe L Authorized by: Ward Givens Consent: Verbal consent obtained. Risks and benefits: risks, benefits and alternatives were discussed Consent given by: patient Patient identity confirmed: provided demographic data Prepped and Draped in normal sterile fashion Wound explored  Foreign Body Location:  Heel of right foot  Laceration Length: 1mm   Foreign Body palpated  Anesthesia: local  infiltration  Local anesthetic: 0.5 % marcaine  Anesthetic total: 2  ml  Irrigation method: syringe  Amount of cleaning: standard  The puncture wound was extended with an 11 blade scalpel. A 3 mm piece of glass was removed. His foot was then soaked in sterile saline and Betadine. Sutures were not placed.  Skin closure: none  Patient tolerance: Patient tolerated the procedure well with no immediate complications.    Labs Review Labs Reviewed - No data to display  Imaging Review Dg Foot Complete Left  04/29/2015   CLINICAL DATA:  24 year old male with foreign body in the heel of the left foot  EXAM: LEFT FOOT - COMPLETE 3+ VIEW  COMPARISON:  Radiograph dated 03/01/2013 the  FINDINGS: There is no evidence of fracture or dislocation. There is no evidence of arthropathy or other focal bone abnormality. There is a 3 mm linear  radiopaque density in the subcutaneous soft tissues of the heel approximately 5 mm from the skin surface.  IMPRESSION: Linear radiopaque foreign object in the subcutaneous soft tissue of the heel   Electronically Signed   By: Elgie CollardArash  Radparvar M.D.   On: 04/29/2015 02:12     EKG Interpretation None      MDM  I discussed with his mother if he still has a foreign body sensation he should follow-up with either the general surgeon or an orthopedist. He was placed on anti-biotics because of his poor general hygiene.    Final diagnoses:  Foreign body in foot, right, initial encounter    Discharge Medication List as of 04/29/2015  4:41 AM    START taking these medications   Details  sulfamethoxazole-trimethoprim (BACTRIM DS,SEPTRA DS) 800-160 MG per tablet Take 1 tablet by mouth 2 (two) times daily., Starting 04/29/2015, Until Discontinued, Print          Plan discharge  Devoria AlbeIva Deidre Carino, MD, Concha PyoFACEP     Ashira Kirsten, MD 04/29/15 [phone]

## 2015-04-29 NOTE — Discharge Instructions (Signed)
Soak your foot in warm epsom salts 2-3 times a day. The laceration will heal on it's on, the outer calloused skin of his heal will not close back together however, but as he wears the old skin off it will gradually get down to the new healed skin. Take the antibiotic until gone. Recheck if you get worsening pain, you feel the sharp jab when you walk, you get increased redness or swelling, or see red streaks or fever.    Sliver Removal You have had a sliver (splinter) removed. This has caused a wound that extends through some or all layers of the skin and possibly into the subcutaneous tissue. This is the tissue just beneath the skin. Because these wounds can not be cleaned well, it is necessary to watch closely for infection. AFTER THE PROCEDURE  If a cut (incision) was necessary to remove this, it may have been repaired for you by your caregiver either with suturing, stapling, or adhesive strips. These keep together the skin edges and allow better and faster healing. HOME CARE INSTRUCTIONS   A dressing may have been applied. This may be changed once per day or as instructed. If the dressing sticks, it may be soaked off with a gauze pad or clean cloth that has been dampened with soapy water or hydrogen peroxide.  It is difficult to remove all slivers or foreign bodies as they may break or splinter into smaller pieces. Be aware that your body will work to remove the foreign substance. That is, the foreign body may work itself out of the wound. That is normal.  Watch for signs of infection and notify your caregiver if you suspect a sliver or foreign body remains in the wound.  You may have received a recommendation to follow up with your physician or a specialist. It is very important to call for or keep follow-up appointments in order to avoid infection or other complications.  Only take over-the-counter or prescription medicines for pain, discomfort, or fever as directed by your caregiver.  If  antibiotics were prescribed, be sure to finish all of the medicine. If you did not receive a tetanus shot today because you did not recall when your last one was given, check with your caregiver in the next day or two during follow up to determine if one is needed. SEEK MEDICAL CARE IF:   The area around the wound has new or worsening redness or tenderness.  Pus is coming from the wound  There is a foul smell from the wound or dressing  The edges of a wound that had been repaired break open SEEK IMMEDIATE MEDICAL CARE IF:   Red streaks are coming from the wound  An unexplained oral temperature above 102 F (38.9 C) develops. Document Released: 10/02/2000 Document Revised: 12/28/2011 Document Reviewed: 05/21/2008 [facility] Patient Information 2015 [place], [place]. This information is not intended to replace advice given to you by your health care provider. Make sure you discuss any questions you have with your health care provider.

## 2015-06-30 ENCOUNTER — Encounter (HOSPITAL_COMMUNITY): Payer: Self-pay | Admitting: *Deleted

## 2015-06-30 ENCOUNTER — Emergency Department (HOSPITAL_COMMUNITY)
Admission: EM | Admit: 2015-06-30 | Discharge: 2015-06-30 | Discharge status: Against Medical Advice | Payer: Medicaid Other | Attending: Emergency Medicine | Admitting: Emergency Medicine

## 2015-06-30 DIAGNOSIS — J45909 Unspecified asthma, uncomplicated: Secondary | ICD-10-CM | POA: Diagnosis not present

## 2015-06-30 DIAGNOSIS — Y998 Other external cause status: Secondary | ICD-10-CM | POA: Diagnosis not present

## 2015-06-30 DIAGNOSIS — Y9289 Other specified places as the place of occurrence of the external cause: Secondary | ICD-10-CM | POA: Insufficient documentation

## 2015-06-30 DIAGNOSIS — S3991XA Unspecified injury of abdomen, initial encounter: Secondary | ICD-10-CM | POA: Insufficient documentation

## 2015-06-30 DIAGNOSIS — X58XXXA Exposure to other specified factors, initial encounter: Secondary | ICD-10-CM | POA: Insufficient documentation

## 2015-06-30 DIAGNOSIS — Y9389 Activity, other specified: Secondary | ICD-10-CM | POA: Diagnosis not present

## 2015-06-30 NOTE — ED Notes (Signed)
Pt c/o abdominal pain after helping unload a refridgerator yesterday afternoon and it hit him in the abdomen while he was unloading

## 2015-06-30 NOTE — ED Notes (Signed)
Pt not in waiting area 

## 2015-06-30 NOTE — ED Notes (Signed)
No answer in waiting room X1,  

## 2015-06-30 NOTE — ED Notes (Signed)
NO ANSWER IN WAITING ROOM,

## 2015-07-15 ENCOUNTER — Emergency Department (HOSPITAL_COMMUNITY): Payer: Medicaid Other

## 2015-07-15 ENCOUNTER — Emergency Department (HOSPITAL_COMMUNITY)
Admission: EM | Admit: 2015-07-15 | Discharge: 2015-07-16 | Disposition: A | Discharge status: Home / Self Care | Payer: Medicaid Other | Attending: Emergency Medicine | Admitting: Emergency Medicine

## 2015-07-15 ENCOUNTER — Encounter (HOSPITAL_COMMUNITY): Payer: Self-pay | Admitting: Emergency Medicine

## 2015-07-15 DIAGNOSIS — S3992XA Unspecified injury of lower back, initial encounter: Secondary | ICD-10-CM | POA: Insufficient documentation

## 2015-07-15 DIAGNOSIS — S3991XA Unspecified injury of abdomen, initial encounter: Secondary | ICD-10-CM | POA: Diagnosis present

## 2015-07-15 DIAGNOSIS — Z8659 Personal history of other mental and behavioral disorders: Secondary | ICD-10-CM | POA: Insufficient documentation

## 2015-07-15 DIAGNOSIS — Y998 Other external cause status: Secondary | ICD-10-CM | POA: Insufficient documentation

## 2015-07-15 DIAGNOSIS — Z880 Allergy status to penicillin: Secondary | ICD-10-CM | POA: Diagnosis not present

## 2015-07-15 DIAGNOSIS — Z79899 Other long term (current) drug therapy: Secondary | ICD-10-CM | POA: Diagnosis not present

## 2015-07-15 DIAGNOSIS — Y9389 Activity, other specified: Secondary | ICD-10-CM | POA: Diagnosis not present

## 2015-07-15 DIAGNOSIS — Y9289 Other specified places as the place of occurrence of the external cause: Secondary | ICD-10-CM | POA: Insufficient documentation

## 2015-07-15 DIAGNOSIS — S301XXA Contusion of abdominal wall, initial encounter: Secondary | ICD-10-CM | POA: Insufficient documentation

## 2015-07-15 DIAGNOSIS — W208XXA Other cause of strike by thrown, projected or falling object, initial encounter: Secondary | ICD-10-CM | POA: Insufficient documentation

## 2015-07-15 DIAGNOSIS — Z7951 Long term (current) use of inhaled steroids: Secondary | ICD-10-CM | POA: Diagnosis not present

## 2015-07-15 DIAGNOSIS — J45909 Unspecified asthma, uncomplicated: Secondary | ICD-10-CM | POA: Diagnosis not present

## 2015-07-15 LAB — COMPREHENSIVE METABOLIC PANEL
ALBUMIN: 4.8 g/dL (ref 3.5–5.0)
ALK PHOS: 77 U/L (ref 38–126)
ALT: 24 U/L (ref 17–63)
AST: 25 U/L (ref 15–41)
Anion gap: 6 (ref 5–15)
BILIRUBIN TOTAL: 0.9 mg/dL (ref 0.3–1.2)
BUN: 16 mg/dL (ref 6–20)
CO2: 27 mmol/L (ref 22–32)
CREATININE: 0.86 mg/dL (ref 0.61–1.24)
Calcium: 9.6 mg/dL (ref 8.9–10.3)
Chloride: 105 mmol/L (ref 101–111)
GFR calc Af Amer: >60 mL/min (ref >60)
GFR calc non Af Amer: >60 mL/min (ref >60)
GLUCOSE: 96 mg/dL (ref 65–99)
Potassium: 4.2 mmol/L (ref 3.5–5.1)
Sodium: 138 mmol/L (ref 135–145)
TOTAL PROTEIN: 7.6 g/dL (ref 6.5–8.1)

## 2015-07-15 LAB — CBC WITH DIFFERENTIAL/PLATELET
BASOS ABS: 0 10*3/uL (ref 0.0–0.1)
Basophils Relative: 0 %
EOS PCT: 6 %
Eosinophils Absolute: 0.6 10*3/uL (ref 0.0–0.7)
HCT: 34.7 % — ABNORMAL LOW (ref 39.0–52.0)
HEMOGLOBIN: 11.2 g/dL — AB (ref 13.0–17.0)
LYMPHS PCT: 23 %
Lymphs Abs: 2.3 10*3/uL (ref 0.7–4.0)
MCH: 20.1 pg — AB (ref 26.0–34.0)
MCHC: 32.3 g/dL (ref 30.0–36.0)
MCV: 62.4 fL — ABNORMAL LOW (ref 78.0–100.0)
MONO ABS: 0.5 10*3/uL (ref 0.1–1.0)
Monocytes Relative: 5 %
Neutro Abs: 6.7 10*3/uL (ref 1.7–7.7)
Neutrophils Relative %: 66 %
Platelets: 187 10*3/uL (ref 150–400)
RBC: 5.56 MIL/uL (ref 4.22–5.81)
RDW: 15.1 % (ref 11.5–15.5)
WBC: 10 10*3/uL (ref 4.0–10.5)

## 2015-07-15 LAB — URINALYSIS, ROUTINE W REFLEX MICROSCOPIC
Bilirubin Urine: NEGATIVE
Glucose, UA: NEGATIVE mg/dL
Hgb urine dipstick: NEGATIVE
Ketones, ur: NEGATIVE mg/dL
Leukocytes, UA: NEGATIVE
Nitrite: NEGATIVE
Protein, ur: NEGATIVE mg/dL
Urobilinogen, UA: 0.2 mg/dL (ref 0.0–1.0)
pH: 7.5 (ref 5.0–8.0)

## 2015-07-15 MED ORDER — SODIUM CHLORIDE 0.9 % IJ SOLN
INTRAMUSCULAR | Status: AC
  Administered 2015-07-16
  Filled 2015-07-15: qty 1000

## 2015-07-15 MED ORDER — IOHEXOL 300 MG/ML  SOLN
100.0000 mL | Freq: Once | INTRAMUSCULAR | Status: AC | PRN
  Administered 2015-07-15: 100 mL via INTRAVENOUS

## 2015-07-15 MED ORDER — SODIUM CHLORIDE 0.9 % IV SOLN
INTRAVENOUS | Status: DC
  Administered 2015-07-15: 22:00:00 via INTRAVENOUS

## 2015-07-15 MED ORDER — IOHEXOL 300 MG/ML  SOLN
50.0000 mL | Freq: Once | INTRAMUSCULAR | Status: AC | PRN
  Administered 2015-07-15: 50 mL via ORAL

## 2015-07-15 NOTE — ED Provider Notes (Signed)
CSN: [id number]     Arrival date & time 07/15/15  1939 History  By signing my name below, I, Eugene Hicks, attest that this documentation has been prepared under the direction and in the presence of Eugene Bale, MD. Electronically Signed: Octavia Hicks, ED Scribe. 07/15/2015. 9:22 PM.    Chief Complaint  Patient presents with  . Abdominal Injury      The history is provided by the patient and a parent. No language interpreter was used.   HPI Comments: Eugene Hicks is a 24 y.o. male who presents to the Emergency Department complaining of a sudden onset, gradual worsening upper abdominal injury onset 2 days ago. He has associated shortness of breath. Per father, pt was helping him unload a refrigerator off the back of a pickup truck when it fell and hit the patient in the upper abdominal area. He denies cough, loss of appetite, vomiting, and dizziness when ambulating.  Past Medical History  Diagnosis Date  . ADHD (attention deficit hyperactivity disorder)   . Back pain   . Shoulder pain   . Asthma    Past Surgical History  Procedure Laterality Date  . Upper gi endoscopy     Family History  Problem Relation Age of Onset  . Stroke Father   . Seizures Mother   . Seizures Brother    Social History  Substance Use Topics  . Smoking status: Never Smoker   . Smokeless tobacco: Never Used  . Alcohol Use: No    Review of Systems  Constitutional: Negative for appetite change.  Respiratory: Negative for cough.   Gastrointestinal: Positive for abdominal pain. Negative for vomiting.  Neurological: Negative for dizziness.  All other systems reviewed and are negative.     Allergies  Codeine; Lactose intolerance (gi); Percocet; and Penicillins  Home Medications   Prior to Admission medications   Medication Sig Start Date End Date Taking? Authorizing Provider  ALPRAZolam Prudy Feeler) 1 MG tablet Take 1 tablet twice daily for 7 days, then take 1/2 tablet twice daily for 14 days,  then 1/2 tablet once daily for 7 days. Patient taking differently: Take 1 mg by mouth 2 (two) times daily.  10/28/14  Yes Raynelle Fanning Idol, PA-C  fluticasone (FLONASE) 50 MCG/ACT nasal spray Place 2 sprays into the nose daily.     Yes Historical Provider, MD  lithium carbonate 300 MG capsule Take 300-600 mg by mouth 2 (two) times daily. Take one capsule ( ) every day in the morning, and take two capsules ( ) every day at bedtime   Yes Historical Provider, MD  propranolol (INNOPRAN XL) 80 MG 24 hr capsule Take 80 mg by mouth daily.    Yes Historical Provider, MD  QUEtiapine (SEROQUEL) 300 MG tablet Take 300 mg by mouth every evening.    Yes Historical Provider, MD  cyclobenzaprine (FLEXERIL) 10 MG tablet Take 1 tablet (10 mg total) by mouth 2 (two) times daily as needed for muscle spasms. Patient not taking: Reported on 01/24/2015 11/09/14   [name], NP  EPINEPHrine (EPI-PEN) 0.3 mg/0.3 mL SOAJ injection Inject 0.3 mLs (0.3 mg total) into the muscle once. 07/26/13   Donnetta Hutching, MD  famotidine (PEPCID) 20 MG tablet Take 1 tablet (20 mg total) by mouth 2 (two) times daily. Patient not taking: Reported on 08/26/2014 03/06/14   [name], NP   Triage vitals: BP 122/55 mmHg  Pulse 53  Temp(Src) 98.1 F (36.7 C) (Oral)  Resp 18  Ht  (1.676 m)  Wt 138 lb (62.596 kg)  BMI 22.28 kg/m2  SpO2 100% Physical Exam  Constitutional: He is oriented to person, place, and time. He appears well-developed and well-nourished.  HENT:  Head: Normocephalic and atraumatic.  Right Ear: External ear normal.  Left Ear: External ear normal.  Eyes: Conjunctivae and EOM are normal. Pupils are equal, round, and reactive to light.  Neck: Normal range of motion and phonation normal. Neck supple.  Cardiovascular: Normal rate, regular rhythm and normal heart sounds.   Pulmonary/Chest: Effort normal and breath sounds normal. He exhibits no bony tenderness.  Abdominal: Soft. Bowel sounds are normal. There is  tenderness. There is guarding. There is no rebound.  Bilateral upper quadrant tenderness with guarding  Musculoskeletal: Normal range of motion.  Mild diffuse tenderness of the back  Neurological: He is alert and oriented to person, place, and time. No cranial nerve deficit or sensory deficit. He exhibits normal muscle tone. Coordination normal.  Skin: Skin is warm, dry and intact.  Psychiatric: He has a normal mood and affect. His behavior is normal. Judgment and thought content normal.  Nursing note and vitals reviewed.   ED Course  Procedures  DIAGNOSTIC STUDIES: Oxygen Saturation is 100% on RA, normal by my interpretation.  COORDINATION OF CARE: 9:26 PM Discussed treatment plan which includes imaging of abdomen with pt at bedside and pt agreed to plan.  Labs Review Labs Reviewed  CBC WITH DIFFERENTIAL/PLATELET - Abnormal; Notable for the following:    Hemoglobin 11.2 (*)    HCT 34.7 (*)    MCV 62.4 (*)    MCH 20.1 (*)    All other components within normal limits  URINALYSIS, ROUTINE W REFLEX MICROSCOPIC (NOT AT [facility]) - Abnormal; Notable for the following:    Color, Urine STRAW (*)    Specific Gravity, Urine <1.005 (*)    All other components within normal limits  COMPREHENSIVE METABOLIC PANEL    Imaging Review Ct Abdomen Pelvis W Contrast  07/15/2015   CLINICAL DATA:  Abdominal pain, gradual worsening after injury 2 days ago.  EXAM: CT ABDOMEN AND PELVIS WITH CONTRAST  TECHNIQUE: Multidetector CT imaging of the abdomen and pelvis was performed using the standard protocol following bolus administration of intravenous contrast.  CONTRAST:  50mL OMNIPAQUE IOHEXOL 300 MG/ML SOLN, OMNIPAQUE IOHEXOL 300 MG/ML SOLN  COMPARISON:  11/21/2013  FINDINGS: Lower chest and abdominal wall:  No traumatic findings.  Hepatobiliary: No focal liver abnormality.No evidence of biliary obstruction or stone.  Pancreas: Unremarkable.  Spleen: Stable prominent splenic size without focal  abnormality.  Adrenals/Urinary Tract: Negative adrenals. No evidence of renal injury. Unremarkable bladder.  Reproductive:Left varicocele.  Stomach/Bowel:  No evidence of injury. No appendicitis.  Vascular/Lymphatic: No acute vascular abnormality. No mass or adenopathy.  Peritoneal: Trace pelvic fluid, low-density and seen on each prior CT since 2008.  Musculoskeletal: No acute abnormalities.  IMPRESSION: 1. No traumatic finding. 2. Trace pelvic fluid. Although unexpected, pelvic fluid has been seen on each abdominal CT since 2008and is considered incidental to the history. 3. Left varicocele.   Electronically Signed   By: Marnee Spring M.D.   On: 07/15/2015 22:56   I have personally reviewed and evaluated these images and lab results as part of my medical decision-making.   EKG Interpretation None      MDM   Final diagnoses:  Contusion, abdominal wall, initial encounter    Contusion without significant pain or abnormal findings. Doubt visceral injury.  Nursing Notes Reviewed/ Care Coordinated Applicable Imaging  Reviewed Interpretation of Laboratory Data incorporated into ED treatment  The patient appears reasonably screened and/or stabilized for discharge and I doubt any other medical condition or other [facility] requiring further screening, evaluation, or treatment in the ED at this time prior to discharge.  Plan: Home Medications- OTC analgesia; Home Treatments- rest; return here if the recommended treatment, does not improve the symptoms; Recommended follow up- PCP prn   I personally performed the services described in this documentation, which was scribed in my presence. The recorded information has been reviewed and is accurate.   Eugene Bale, MD 07/16/15 [phone]

## 2015-07-15 NOTE — ED Notes (Signed)
Pt was unloading a refrigerator, it fell back and fell on pt, hitting him in the abdmonial area, pt state is took his breath and he continue to feel SOB

## 2015-07-16 NOTE — Discharge Instructions (Signed)
There were no serious injuries on the CT scan tonight. For pain, use heat on the sore area and take Tylenol every 4 hours.   Contusion A contusion is a deep bruise. Contusions are the result of an injury that caused bleeding under the skin. The contusion may turn blue, purple, or yellow. Minor injuries will give you a painless contusion, but more severe contusions may stay painful and swollen for a few weeks.  CAUSES  A contusion is usually caused by a blow, trauma, or direct force to an area of the body. SYMPTOMS   Swelling and redness of the injured area.  Bruising of the injured area.  Tenderness and soreness of the injured area.  Pain. DIAGNOSIS  The diagnosis can be made by taking a history and physical exam. An X-ray, CT scan, or MRI may be needed to determine if there were any associated injuries, such as fractures. TREATMENT  Specific treatment will depend on what area of the body was injured. In general, the best treatment for a contusion is resting, icing, elevating, and applying cold compresses to the injured area. Over-the-counter medicines may also be recommended for pain control. Ask your caregiver what the best treatment is for your contusion. HOME CARE INSTRUCTIONS   Put ice on the injured area.  Put ice in a plastic bag.  Place a towel between your skin and the bag.  Leave the ice on for 15-20 minutes, 3-4 times a day, or as directed by your health care provider.  Only take over-the-counter or prescription medicines for pain, discomfort, or fever as directed by your caregiver. Your caregiver may recommend avoiding anti-inflammatory medicines (aspirin, ibuprofen, and naproxen) for 48 hours because these medicines may increase bruising.  Rest the injured area.  If possible, elevate the injured area to reduce swelling. SEEK IMMEDIATE MEDICAL CARE IF:   You have increased bruising or swelling.  You have pain that is getting worse.  Your swelling or pain is not  relieved with medicines. MAKE SURE YOU:   Understand these instructions.  Will watch your condition.  Will get help right away if you are not doing well or get worse. Document Released: 07/15/2005 Document Revised: 10/10/2013 Document Reviewed: 08/10/2011 [facility] Patient Information 2015 [place], [place]. This information is not intended to replace advice given to you by your health care provider. Make sure you discuss any questions you have with your health care provider.

## 2015-10-18 ENCOUNTER — Emergency Department (HOSPITAL_COMMUNITY)
Admission: EM | Admit: 2015-10-18 | Discharge: 2015-10-18 | Disposition: A | Discharge status: Home / Self Care | Payer: Medicaid Other | Attending: Emergency Medicine | Admitting: Emergency Medicine

## 2015-10-18 ENCOUNTER — Encounter (HOSPITAL_COMMUNITY): Payer: Self-pay | Admitting: *Deleted

## 2015-10-18 DIAGNOSIS — Z880 Allergy status to penicillin: Secondary | ICD-10-CM | POA: Insufficient documentation

## 2015-10-18 DIAGNOSIS — J45909 Unspecified asthma, uncomplicated: Secondary | ICD-10-CM | POA: Diagnosis not present

## 2015-10-18 DIAGNOSIS — K0889 Other specified disorders of teeth and supporting structures: Secondary | ICD-10-CM | POA: Insufficient documentation

## 2015-10-18 DIAGNOSIS — Z8659 Personal history of other mental and behavioral disorders: Secondary | ICD-10-CM | POA: Diagnosis not present

## 2015-10-18 DIAGNOSIS — Z79899 Other long term (current) drug therapy: Secondary | ICD-10-CM | POA: Insufficient documentation

## 2015-10-18 DIAGNOSIS — Z7951 Long term (current) use of inhaled steroids: Secondary | ICD-10-CM | POA: Diagnosis not present

## 2015-10-18 MED ORDER — CLINDAMYCIN HCL 150 MG PO CAPS
300.0000 mg | ORAL_CAPSULE | Freq: Once | ORAL | Status: AC
  Administered 2015-10-18: 300 mg via ORAL
  Filled 2015-10-18: qty 2

## 2015-10-18 MED ORDER — CLINDAMYCIN HCL 300 MG PO CAPS: 300.0000 mg | ORAL_CAPSULE | Freq: Three times a day (TID) | ORAL | Status: DC

## 2015-10-18 NOTE — ED Notes (Signed)
Family reporting pt has been referred to oral surgeon to have teeth removed.  Unable to obtain appointment.  Reporting out of antibiotics and pain medications.

## 2015-10-18 NOTE — ED Provider Notes (Signed)
CSN: [id number]     Arrival date & time 10/18/15  1903 History   First MD Initiated Contact with Patient 10/18/15 1958     Chief Complaint  Patient presents with  . Dental Pain     (Consider location/radiation/quality/duration/timing/severity/associated sxs/prior Treatment) HPI Patient has been referred to oral surgery for dental extraction however he has been unable to make an appointment at this time.  He presents with complaints of diffuse pain throughout his mouth.  No fevers or chills.  No breathing or swallowing difficulty.  No other complaints.  Pain is moderate to severe in severity.  He reports he is currently on antibiotics and pain medication and requests both.   Past Medical History  Diagnosis Date  . ADHD (attention deficit hyperactivity disorder)   . Back pain   . Shoulder pain   . Asthma    Past Surgical History  Procedure Laterality Date  . Upper gi endoscopy     Family History  Problem Relation Age of Onset  . Stroke Father   . Seizures Mother   . Seizures Brother    Social History  Substance Use Topics  . Smoking status: Never Smoker   . Smokeless tobacco: Never Used  . Alcohol Use: No    Review of Systems  All other systems reviewed and are negative.     Allergies  Codeine; Lactose intolerance (gi); Percocet; and Penicillins  Home Medications   Prior to Admission medications   Medication Sig Start Date End Date Taking? Authorizing Provider  ALPRAZolam Prudy Feeler) 1 MG tablet Take 1 tablet twice daily for 7 days, then take 1/2 tablet twice daily for 14 days, then 1/2 tablet once daily for 7 days. Patient taking differently: Take 1 mg by mouth 2 (two) times daily.  10/28/14   Burgess Amor, PA-C  clindamycin (CLEOCIN) 300 MG capsule Take 1 capsule (300 mg total) by mouth 3 (three) times daily. 10/18/15   Azalia Bilis, MD  cyclobenzaprine (FLEXERIL) 10 MG tablet Take 1 tablet (10 mg total) by mouth 2 (two) times daily as needed for muscle  spasms. Patient not taking: Reported on 01/24/2015 11/09/14   [name], NP  EPINEPHrine (EPI-PEN) 0.3 mg/0.3 mL SOAJ injection Inject 0.3 mLs (0.3 mg total) into the muscle once. 07/26/13   Donnetta Hutching, MD  famotidine (PEPCID) 20 MG tablet Take 1 tablet (20 mg total) by mouth 2 (two) times daily. Patient not taking: Reported on 08/26/2014 03/06/14   [name], NP  fluticasone [facility]) 50 MCG/ACT nasal spray Place 2 sprays into the nose daily.      Historical Provider, MD  lithium carbonate 300 MG capsule Take 300-600 mg by mouth 2 (two) times daily. Take one capsule ( ) every day in the morning, and take two capsules ( ) every day at bedtime    Historical Provider, MD  propranolol (INNOPRAN XL) 80 MG 24 hr capsule Take 80 mg by mouth daily.     Historical Provider, MD  QUEtiapine (SEROQUEL) 300 MG tablet Take 300 mg by mouth every evening.     Historical Provider, MD   BP 112/69 mmHg  Pulse 74  Temp(Src) 98.1 F (36.7 C) (Oral)  Resp 16  Ht  (1.676 m)  Wt 140 lb (63.504 kg)  BMI 22.61 kg/m2  SpO2 98% Physical Exam  Constitutional: He is oriented to person, place, and time. He appears well-developed and well-nourished.  HENT:  Head: Normocephalic.  Extremely poor dentition throughout.  Oral airway patent.  Tolerating  secretions.  No facial swelling  Eyes: EOM are normal.  Neck: Normal range of motion.  Pulmonary/Chest: Effort normal.  Abdominal: He exhibits no distension.  Musculoskeletal: Normal range of motion.  Neurological: He is alert and oriented to person, place, and time.  Psychiatric: He has a normal mood and affect.  Nursing note and vitals reviewed.   ED Course  Procedures (including critical care time) Labs Review Labs Reviewed - No data to display  Imaging Review No results found. I have personally reviewed and evaluated these images and lab results as part of my medical decision-making.   EKG Interpretation None      MDM   Final diagnoses:   Pain, dental    No indication for additional management or treatment in the ER.  Home with antibiotics.  Instructed to follow-up with oral surgery.  No facial swelling.  Tolerating secretions.    Azalia BilisKevin Yvan Dority, MD 10/18/15 [id number]

## 2015-11-04 IMAGING — DX DG CERVICAL SPINE COMPLETE 4+V
6 series · 6 of 6 positions shown · non-contrast
Comparison: None.

CLINICAL DATA: Recent fall in bathtub with neck and shoulder pain,
initial encounter

EXAM:
CERVICAL SPINE  4+ VIEWS

[c-spine lat]
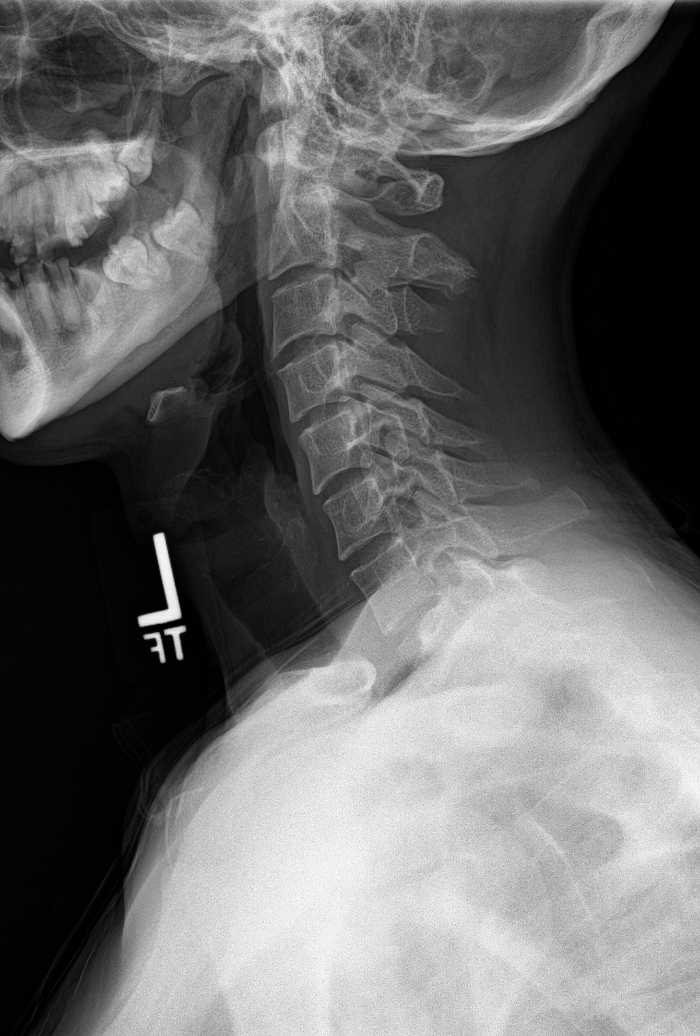

[c-spine obl (1 of 2)]
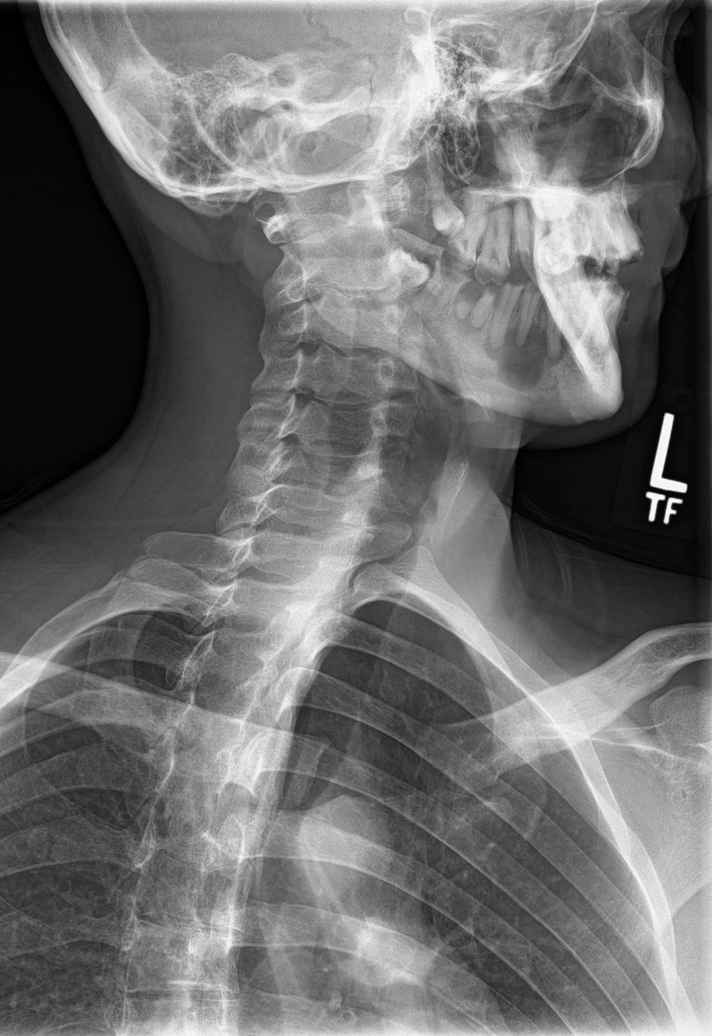

[c-spine obl (2 of 2)]
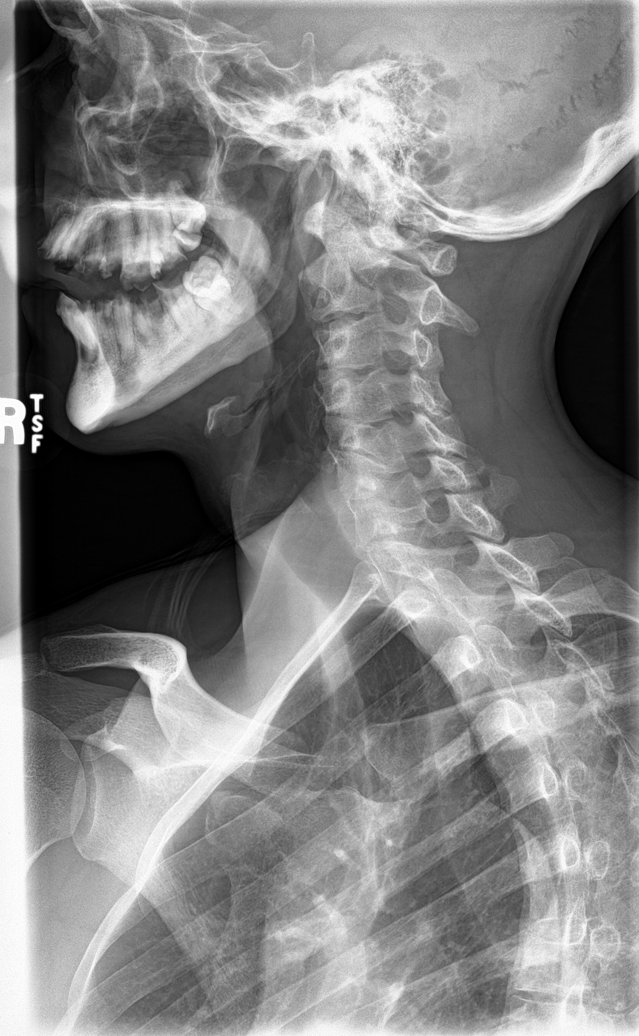

[c-spine ap]
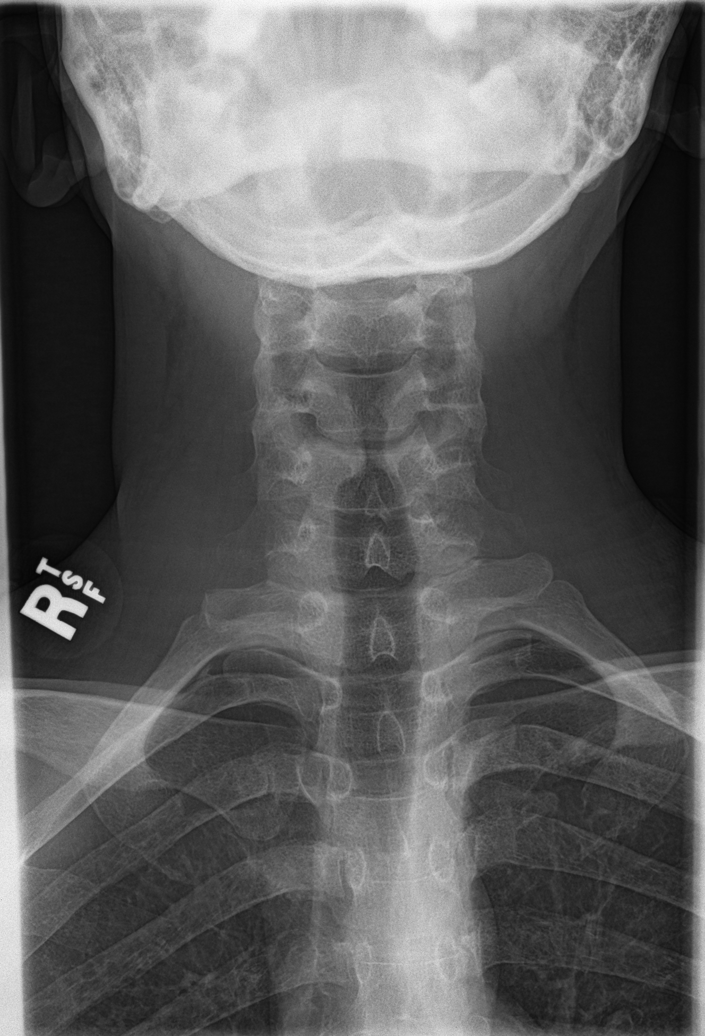

[c-spine open mouth (1 of 2)]
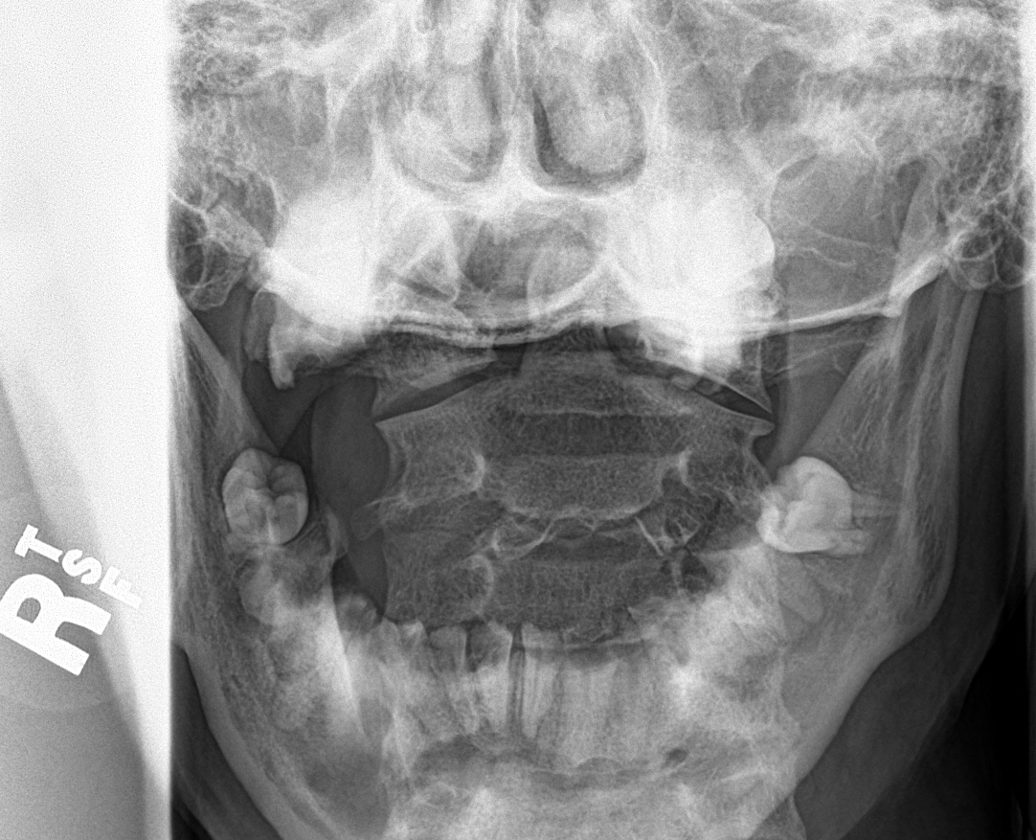

[c-spine open mouth (2 of 2)]
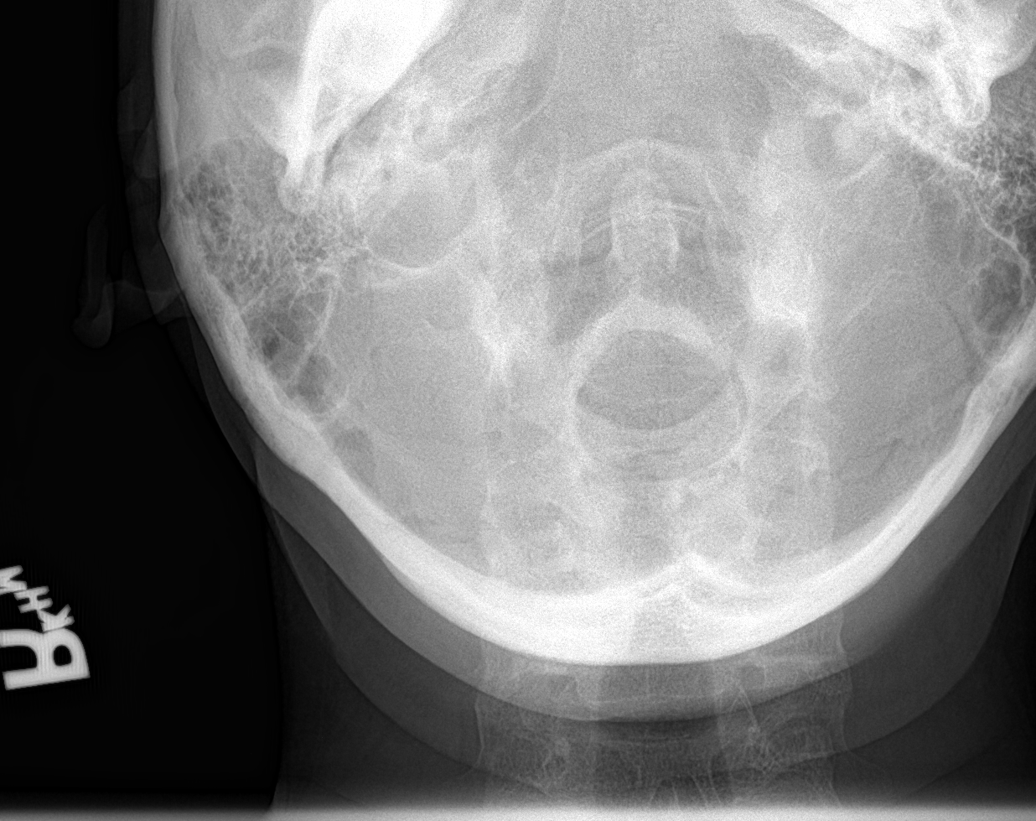

[6 of 6 positions shown; findings below may reference images not displayed]

FINDINGS: Seven cervical segments are well visualized. Vertebral body height
is well maintained. The neural foramina are widely patent. No acute
fracture or facet abnormality is noted in the cervical spine. Stable
thoracic wedge deformity is noted.
IMPRESSION: No acute abnormality noted.

## 2016-01-07 ENCOUNTER — Encounter (HOSPITAL_COMMUNITY): Payer: Self-pay | Admitting: Emergency Medicine

## 2016-01-07 ENCOUNTER — Emergency Department (HOSPITAL_COMMUNITY): Payer: Medicaid Other

## 2016-01-07 ENCOUNTER — Emergency Department (HOSPITAL_COMMUNITY)
Admission: EM | Admit: 2016-01-07 | Discharge: 2016-01-07 | Disposition: A | Discharge status: Home / Self Care | Payer: Medicaid Other | Attending: Dermatology | Admitting: Dermatology

## 2016-01-07 DIAGNOSIS — Z5321 Procedure and treatment not carried out due to patient leaving prior to being seen by health care provider: Secondary | ICD-10-CM | POA: Insufficient documentation

## 2016-01-07 DIAGNOSIS — J45909 Unspecified asthma, uncomplicated: Secondary | ICD-10-CM | POA: Insufficient documentation

## 2016-01-07 DIAGNOSIS — R0602 Shortness of breath: Secondary | ICD-10-CM | POA: Diagnosis not present

## 2016-01-07 NOTE — ED Notes (Signed)
Flu like symptoms for three days. Hurts to take a deep breath and body aches.

## 2016-01-07 NOTE — ED Notes (Signed)
Pt no longer in waiting room 

## 2016-01-07 NOTE — ED Notes (Signed)
Nurse went to call for pt and pt no longer in waiting room

## 2016-02-01 ENCOUNTER — Emergency Department (HOSPITAL_COMMUNITY)
Admission: EM | Admit: 2016-02-01 | Discharge: 2016-02-01 | Disposition: A | Discharge status: Home / Self Care | Payer: Medicaid Other | Attending: Emergency Medicine | Admitting: Emergency Medicine

## 2016-02-01 ENCOUNTER — Encounter (HOSPITAL_COMMUNITY): Payer: Self-pay

## 2016-02-01 DIAGNOSIS — J45909 Unspecified asthma, uncomplicated: Secondary | ICD-10-CM | POA: Insufficient documentation

## 2016-02-01 DIAGNOSIS — F909 Attention-deficit hyperactivity disorder, unspecified type: Secondary | ICD-10-CM | POA: Diagnosis not present

## 2016-02-01 DIAGNOSIS — R51 Headache: Secondary | ICD-10-CM | POA: Diagnosis present

## 2016-02-01 DIAGNOSIS — R519 Headache, unspecified: Secondary | ICD-10-CM

## 2016-02-01 DIAGNOSIS — Z79899 Other long term (current) drug therapy: Secondary | ICD-10-CM | POA: Diagnosis not present

## 2016-02-01 LAB — I-STAT CHEM 8, ED
BUN: 14 mg/dL (ref 6–20)
CHLORIDE: 102 mmol/L (ref 101–111)
CREATININE: 0.9 mg/dL (ref 0.61–1.24)
Calcium, Ion: 1.28 mmol/L — ABNORMAL HIGH (ref 1.12–1.23)
GLUCOSE: 99 mg/dL (ref 65–99)
HCT: 39 % (ref 39.0–52.0)
Hemoglobin: 13.3 g/dL (ref 13.0–17.0)
POTASSIUM: 4 mmol/L (ref 3.5–5.1)
Sodium: 142 mmol/L (ref 135–145)
TCO2: 26 mmol/L (ref 0–100)

## 2016-02-01 MED ORDER — METOCLOPRAMIDE HCL 5 MG/ML IJ SOLN
10.0000 mg | Freq: Once | INTRAMUSCULAR | Status: AC
  Administered 2016-02-01: 10 mg via INTRAVENOUS
  Filled 2016-02-01: qty 2

## 2016-02-01 MED ORDER — SODIUM CHLORIDE 0.9 % IV BOLUS (SEPSIS)
1000.0000 mL | Freq: Once | INTRAVENOUS | Status: AC
  Administered 2016-02-01: 1000 mL via INTRAVENOUS

## 2016-02-01 MED ORDER — KETOROLAC TROMETHAMINE 30 MG/ML IJ SOLN
30.0000 mg | Freq: Once | INTRAMUSCULAR | Status: AC
  Administered 2016-02-01: 30 mg via INTRAVENOUS
  Filled 2016-02-01: qty 1

## 2016-02-01 NOTE — ED Notes (Signed)
Complaining of pain in the back of his head. Started two hours ago per pt.

## 2016-02-01 NOTE — Discharge Instructions (Signed)
Continue to take Motrin and Tylenol as needed for headache. Get plenty of rest and drink plenty of fluids. Return without fail for worsening symptoms including worsening pain, intractable headache, vomiting, confusion, new vision or speech changes, difficulty walking, new numbness or weakness, or any other symptoms concerning to you.  General Headache Without Cause A headache is pain or discomfort felt around the head or neck area. There are many causes and types of headaches. In some cases, the cause may not be found.  HOME CARE  Managing Pain  Take over-the-counter and prescription medicines only as told by your doctor.  Lie down in a dark, quiet room when you have a headache.  If directed, apply ice to the head and neck area:  Put ice in a plastic bag.  Place a towel between your skin and the bag.  Leave the ice on for 20 minutes, 2-3 times per day.  Use a heating pad or hot shower to apply heat to the head and neck area as told by your doctor.  Keep lights dim if bright lights bother you or make your headaches worse. Eating and Drinking  Eat meals on a regular schedule.  Lessen how much alcohol you drink.  Lessen how much caffeine you drink, or stop drinking caffeine. General Instructions  Keep all follow-up visits as told by your doctor. This is important.  Keep a journal to find out if certain things bring on headaches. For example, write down:  What you eat and drink.  How much sleep you get.  Any change to your diet or medicines.  Relax by getting a massage or doing other relaxing activities.  Lessen stress.  Sit up straight. Do not tighten (tense) your muscles.  Do not use tobacco products. This includes cigarettes, chewing tobacco, or e-cigarettes. If you need help quitting, ask your doctor.  Exercise regularly as told by your doctor.  Get enough sleep. This often means 7-9 hours of sleep. GET HELP IF:  Your symptoms are not helped by medicine.  You  have a headache that feels different than the other headaches.  You feel sick to your stomach (nauseous) or you throw up (vomit).  You have a fever. GET HELP RIGHT AWAY IF:   Your headache becomes really bad.  You keep throwing up.  You have a stiff neck.  You have trouble seeing.  You have trouble speaking.  You have pain in the eye or ear.  Your muscles are weak or you lose muscle control.  You lose your balance or have trouble walking.  You feel like you will pass out (faint) or you pass out.  You have confusion.   This information is not intended to replace advice given to you by your health care provider. Make sure you discuss any questions you have with your health care provider.   Document Released: 07/14/2008 Document Revised: 06/26/2015 Document Reviewed: 01/28/2015 Elsevier Interactive Patient Education [employer]2016 [employer] Inc.

## 2016-02-01 NOTE — ED Provider Notes (Signed)
CSN: [id number]     Arrival date & time 02/01/16  2025 History  By signing my name below, I, Budd Palmer, attest that this documentation has been prepared under the direction and in the presence of Lavera Guise, MD. Electronically Signed: Budd Palmer, ED Scribe. 02/01/2016. 9:03 PM.     Chief Complaint  Patient presents with  . Headache   The history is provided by the patient. No language interpreter was used.   HPI Comments: Eugene Hicks is a 25 y.o. male with a PMHx of asthma and ADHD who presents to the Emergency Department complaining of a constant, sharp, worsening posterior headache of gradual onset 6 hours ago. Pt states he is feeling "a little dehydrated."  He currently rates the pain as a 9/10 and notes he was lying down when this began. He notes alleviation of th pain with lying supine. Per relative, pt has not had a similar headache in the past. He states pt used to take 2 mg Xanax 4x daily. He notes that then pt switched physicians where he was switched to 1 mg Xanax BID. He reports pt was recently told he needed to see a psychiatrist, and states pt has made an appointment to be seen on 4/24. Pt denies any recent falls or other injuries. He also denies denies cough, rhinorrhea, congestion, n/v/d, photophobia, blurred vision, numbness, and weakness.   Pt is allergic to percocet, penicillins, and codeine, as well as having lactose intolerance (GI).  Past Medical History  Diagnosis Date  . ADHD (attention deficit hyperactivity disorder)   . Back pain   . Shoulder pain   . Asthma    Past Surgical History  Procedure Laterality Date  . Upper gi endoscopy     Family History  Problem Relation Age of Onset  . Stroke Father   . Seizures Mother   . Seizures Brother    Social History  Substance Use Topics  . Smoking status: Never Smoker   . Smokeless tobacco: Never Used  . Alcohol Use: No    Review of Systems  HENT: Negative for congestion and rhinorrhea.   Eyes:  Negative for photophobia and visual disturbance.  Respiratory: Negative for cough.   Gastrointestinal: Negative for nausea, vomiting and diarrhea.  Neurological: Positive for headaches. Negative for weakness and numbness.  All other systems reviewed and are negative.   Allergies  Codeine; Lactose intolerance (gi); Percocet; and Penicillins  Home Medications   Prior to Admission medications   Medication Sig Start Date End Date Taking? Authorizing Provider  acetaminophen (TYLENOL) 500 MG tablet Take 500 mg by mouth every 6 (six) hours as needed for mild pain or moderate pain.   Yes Historical Provider, MD  ALPRAZolam Prudy Feeler) 1 MG tablet Take 1 tablet twice daily for 7 days, then take 1/2 tablet twice daily for 14 days, then 1/2 tablet once daily for 7 days. Patient taking differently: Take 1 mg by mouth 2 (two) times daily.  10/28/14  Yes Burgess Amor, PA-C  EPINEPHrine (EPI-PEN) 0.3 mg/0.3 mL SOAJ injection Inject 0.3 mLs (0.3 mg total) into the muscle once. 07/26/13  Yes Donnetta Hutching, MD  fluticasone [facility]) 50 MCG/ACT nasal spray Place 2 sprays into the nose daily.     Yes Historical Provider, MD  lithium carbonate 300 MG capsule Take 300-600 mg by mouth 2 (two) times daily. Take one capsule ( ) every day in the morning, and take two capsules ( ) every day at bedtime   Yes Historical Provider, MD  propranolol (INNOPRAN XL) 80 MG 24 hr capsule Take 80 mg by mouth daily.    Yes Historical Provider, MD  QUEtiapine (SEROQUEL) 300 MG tablet Take 300 mg by mouth every evening.    Yes Historical Provider, MD   BP 127/58 mmHg  Pulse 57  Temp(Src) 97 F (36.1 C) (Oral)  Resp 15  Ht 5\' 8"  (1.727 m)  Wt 130 lb (58.968 kg)  BMI 19.77 kg/m2  SpO2 100% Physical Exam Physical Exam  Nursing note and vitals reviewed. Constitutional: Well developed, well nourished, non-toxic, and in no acute distress Head: Normocephalic and atraumatic.  Mouth/Throat: Oropharynx is clear mucous membranes are  slightly dry Neck: Normal range of motion. Neck supple.  Cardiovascular: Normal rate and regular rhythm.   Pulmonary/Chest: Effort normal and breath sounds normal.  Abdominal: Soft. There is no tenderness. There is no rebound and no guarding.  Musculoskeletal: Normal range of motion.  Skin: Skin is warm and dry.  Psychiatric: Cooperative Neurological:  Alert, oriented to person, place, time, and situation. Memory grossly in tact. Fluent speech. No dysarthria or aphasia.  Cranial nerves: Pupils are symmetric, and reactive to light. EOMI without nystagmus. No gaze deviation. Facial muscles symmetric with activation. Sensation to light touch over face in tact bilaterally. Hearing grossly in tact. Palate elevates symmetrically. Head turn and shoulder shrug are intact. Tongue midline.  Reflexes defered.  Muscle bulk and tone normal. No pronator drift. Moves all extremities symmetrically. Sensation to light touch is in tact throughout in bilateral upper and lower extremities. Coordination reveals no dysmetria with finger to nose.  ED Course  Procedures  DIAGNOSTIC STUDIES: Oxygen Saturation is 100% on RA, normal by my interpretation.    COORDINATION OF CARE: 9:00 PM - Discussed plans to order a migraine cocktail and IV fluids. Pt advised of plan for treatment and pt agrees. 9:51 PM - headache resolved per patient.    Labs Review Labs Reviewed  I-STAT CHEM 8, ED - Abnormal; Notable for the following:    Calcium, Ion 1.28 (*)    All other components within normal limits    Imaging Review No results found. I have personally reviewed and evaluated these images and lab results as part of my medical decision-making.   EKG Interpretation None      MDM   Final diagnoses:  Acute nonintractable headache, unspecified headache type    25 year old male who presents with headache for 1 day. Stable vital signs with normal neurological exam. Is comfortable and well-appearing. No  meningismus, fever, or recent infectious symptoms suggest presence of meningitis or other infectious intracranial cause. Headache of gradual onset, and history not suggestive of subarachnoid hemorrhage. Low concern for any serious or toxic etiology of his symptoms. Given Toradol and Reglan, with IV fluids and pain fully resolved. Appropriate for discharge home. I do not think he requires any acute imaging at this time. Strict return and follow-up instructions are reviewed. I he and his father expressed understanding of all discharge instructions, and felt comfortable with the plan of care.  I pe rsonally performed the services described in this documentation, which was scribed in my presence. The recorded information has been reviewed and is accurate.   Lavera Guiseana Duo Liu, MD 02/01/16 2153

## 2016-02-01 NOTE — ED Notes (Signed)
Was going to [facility] [place] and was told by Dr. Allyne GeeSanders to see a psychiatrist, so he has an appointment in [place].

## 2016-02-27 ENCOUNTER — Encounter (HOSPITAL_COMMUNITY): Payer: Self-pay | Admitting: *Deleted

## 2016-02-27 ENCOUNTER — Emergency Department (HOSPITAL_COMMUNITY)
Admission: EM | Admit: 2016-02-27 | Discharge: 2016-02-27 | Disposition: A | Discharge status: Home / Self Care | Payer: Medicaid Other | Attending: Dermatology | Admitting: Dermatology

## 2016-02-27 DIAGNOSIS — J45909 Unspecified asthma, uncomplicated: Secondary | ICD-10-CM | POA: Diagnosis not present

## 2016-02-27 DIAGNOSIS — K0889 Other specified disorders of teeth and supporting structures: Secondary | ICD-10-CM | POA: Insufficient documentation

## 2016-02-27 DIAGNOSIS — Z5321 Procedure and treatment not carried out due to patient leaving prior to being seen by health care provider: Secondary | ICD-10-CM | POA: Insufficient documentation

## 2016-02-27 NOTE — ED Notes (Signed)
Pt still not seen in waiting room

## 2016-02-27 NOTE — ED Notes (Signed)
Called x 3, no answer.  

## 2016-02-27 NOTE — ED Notes (Addendum)
Called x 1 no answer

## 2016-02-27 NOTE — ED Notes (Signed)
Pt's father states pt is needing all his teeth removed and is here for a referral

## 2016-03-12 ENCOUNTER — Encounter (HOSPITAL_COMMUNITY): Payer: Self-pay | Admitting: *Deleted

## 2016-03-12 ENCOUNTER — Emergency Department (HOSPITAL_COMMUNITY)
Admission: EM | Admit: 2016-03-12 | Discharge: 2016-03-12 | Disposition: A | Discharge status: Home / Self Care | Payer: Medicaid Other | Attending: Emergency Medicine | Admitting: Emergency Medicine

## 2016-03-12 DIAGNOSIS — J45909 Unspecified asthma, uncomplicated: Secondary | ICD-10-CM | POA: Insufficient documentation

## 2016-03-12 DIAGNOSIS — Z5321 Procedure and treatment not carried out due to patient leaving prior to being seen by health care provider: Secondary | ICD-10-CM | POA: Insufficient documentation

## 2016-03-12 DIAGNOSIS — R55 Syncope and collapse: Secondary | ICD-10-CM | POA: Diagnosis present

## 2016-03-12 NOTE — ED Notes (Signed)
Patient not in waiting area when called to patient room 

## 2016-03-12 NOTE — ED Notes (Signed)
Patient not in waiting room when called

## 2016-03-12 NOTE — ED Notes (Signed)
Pt and family mbr state that pt has been "going out" for the past two days, dad states that pt will be "out" maybe 1-2 minutes, then be alert, pt is aware of episodes, c/o headache and dizziness as well.

## 2016-03-12 NOTE — ED Notes (Signed)
No answer in waiting room X2,  

## 2016-03-15 ENCOUNTER — Emergency Department (HOSPITAL_COMMUNITY)
Admission: EM | Admit: 2016-03-15 | Discharge: 2016-03-15 | Disposition: A | Discharge status: Home / Self Care | Payer: Medicaid Other | Attending: Emergency Medicine | Admitting: Emergency Medicine

## 2016-03-15 ENCOUNTER — Encounter (HOSPITAL_COMMUNITY): Payer: Self-pay | Admitting: Emergency Medicine

## 2016-03-15 DIAGNOSIS — K0889 Other specified disorders of teeth and supporting structures: Secondary | ICD-10-CM

## 2016-03-15 DIAGNOSIS — F909 Attention-deficit hyperactivity disorder, unspecified type: Secondary | ICD-10-CM | POA: Diagnosis not present

## 2016-03-15 DIAGNOSIS — J45909 Unspecified asthma, uncomplicated: Secondary | ICD-10-CM | POA: Insufficient documentation

## 2016-03-15 MED ORDER — KETOROLAC TROMETHAMINE 10 MG PO TABS
10.0000 mg | ORAL_TABLET | Freq: Once | ORAL | Status: AC
  Administered 2016-03-15: 10 mg via ORAL
  Filled 2016-03-15: qty 1

## 2016-03-15 MED ORDER — CLINDAMYCIN HCL 300 MG PO CAPS: 300.0000 mg | ORAL_CAPSULE | Freq: Four times a day (QID) | ORAL | Status: DC

## 2016-03-15 MED ORDER — CLINDAMYCIN HCL 150 MG PO CAPS
300.0000 mg | ORAL_CAPSULE | Freq: Once | ORAL | Status: AC
  Administered 2016-03-15: 300 mg via ORAL
  Filled 2016-03-15: qty 2

## 2016-03-15 NOTE — ED Provider Notes (Signed)
CSN: [id number]     Arrival date & time 03/15/16  1713 History  By signing my name below, I, Mesha Guinyard, attest that this documentation has been prepared under the direction and in the presence of Treatment Team:  Attending Provider: Vanetta Mulders, MD Physician Assistant: [name], PA-C.  Electronically Signed: Arvilla Market, Medical Scribe. 03/15/2016. 6:19 PM.     Chief Complaint  Patient presents with  . Dental Pain    The history is provided by a parent and the patient. No language interpreter was used.   HPI Comments: Eugene Hicks is a 25 y.o. male who presents to the Emergency Department complaining of dental pain onset yesterday. Pt's dad reports all of pts teeth will be removed in June. Pt already has the impressions of his teeth made. He states that he has tried tylenol, and motrin with no relief for his symptoms. He denies trouble swallowing, nausea, or vomiting, facial swelling and fever.  Past Medical History  Diagnosis Date  . ADHD (attention deficit hyperactivity disorder)   . Back pain   . Shoulder pain   . Asthma    Past Surgical History  Procedure Laterality Date  . Upper gi endoscopy     Family History  Problem Relation Age of Onset  . Stroke Father   . Seizures Mother   . Seizures Brother    Social History  Substance Use Topics  . Smoking status: Never Smoker   . Smokeless tobacco: Never Used  . Alcohol Use: No    Review of Systems  Constitutional: Negative for fever and appetite change.  HENT: Positive for dental problem. Negative for congestion, facial swelling, sore throat and trouble swallowing.   Eyes: Negative for pain and visual disturbance.  Gastrointestinal: Negative for nausea and vomiting.  Musculoskeletal: Negative for neck pain and neck stiffness.  Neurological: Negative for dizziness, facial asymmetry and headaches.  Hematological: Negative for adenopathy.  All other systems reviewed and are negative.   Allergies   Codeine; Lactose intolerance (gi); Percocet; and Penicillins  Home Medications   Prior to Admission medications   Medication Sig Start Date End Date Taking? Authorizing Provider  acetaminophen (TYLENOL) 500 MG tablet Take 500 mg by mouth every 6 (six) hours as needed for mild pain or moderate pain.    Historical Provider, MD  ALPRAZolam Prudy Feeler) 1 MG tablet Take 1 tablet twice daily for 7 days, then take 1/2 tablet twice daily for 14 days, then 1/2 tablet once daily for 7 days. Patient taking differently: Take 1 mg by mouth 2 (two) times daily.  10/28/14   Burgess Amor, PA-C  EPINEPHrine (EPI-PEN) 0.3 mg/0.3 mL SOAJ injection Inject 0.3 mLs (0.3 mg total) into the muscle once. 07/26/13   Donnetta Hutching, MD  fluticasone (FLONASE) 50 MCG/ACT nasal spray Place 2 sprays into the nose daily.      Historical Provider, MD  lithium carbonate 300 MG capsule Take 300-600 mg by mouth 2 (two) times daily. Take one capsule ( ) every day in the morning, and take two capsules ( ) every day at bedtime    Historical Provider, MD  propranolol (INNOPRAN XL) 80 MG 24 hr capsule Take 80 mg by mouth daily.     Historical Provider, MD  QUEtiapine (SEROQUEL) 300 MG tablet Take 300 mg by mouth every evening.     Historical Provider, MD   BP 111/64 mmHg  Pulse 70  Temp(Src) 98.6 F (37 C) (Oral)  Resp 18  Ht  (1.727 m)  Wt 150 lb (68.04 kg)  BMI 22.81 kg/m2  SpO2 99% Physical Exam  Constitutional: He appears well-developed and well-nourished. No distress.  HENT:  Head: Normocephalic and atraumatic.  Mouth/Throat: Uvula is midline, oropharynx is clear and moist and mucous membranes are normal. No trismus in the jaw. Dental caries present. No uvula swelling.  Widespread dental decay and peridontal disease. Tenderness of the left upper second premolar No dental abscess  Eyes: Conjunctivae are normal.  Neck: Normal range of motion. Neck supple.  Cardiovascular: Normal rate and regular rhythm.    Pulmonary/Chest: Effort normal. No respiratory distress.  Musculoskeletal: Normal range of motion.  Lymphadenopathy:    He has no cervical adenopathy.  Neurological: He is alert.  Skin: Skin is warm and dry.  Psychiatric: He has a normal mood and affect. His behavior is normal.  Nursing note and vitals reviewed.   ED Course  Procedures  DIAGNOSTIC STUDIES: Oxygen Saturation is 99% on RA, NL by my interpretation.    COORDINATION OF CARE: 6:19 PM Discussed treatment plan with pt at bedside and pt agreed to plan.  MDM   Final diagnoses:  Pain, dental    Pt well appearing.  No concerning sx's for dental abscess or Ludwig's angina.  Pt understands that narcotics are not indicated.  Appears stable for d/c  I personally performed the services described in this documentation, which was scribed in my presence. The recorded information has been reviewed and is accurate.   Pauline Ausammy Bethzy Hauck, PA-C 03/18/16 1820  Vanetta MuldersScott Zackowski, MD 03/19/16 [phone]

## 2016-03-15 NOTE — ED Notes (Signed)
Pt reports left upper dental pain , pt needs to have all teeth extracted.  Pt appt to have teeth out is in mid-June.  Pt reports bleeding from gums, but no odor or taste.

## 2016-07-14 ENCOUNTER — Emergency Department (HOSPITAL_COMMUNITY)
Admission: EM | Admit: 2016-07-14 | Discharge: 2016-07-14 | Disposition: A | Discharge status: Home / Self Care | Payer: Medicaid Other | Attending: Emergency Medicine | Admitting: Emergency Medicine

## 2016-07-14 ENCOUNTER — Encounter (HOSPITAL_COMMUNITY): Payer: Self-pay

## 2016-07-14 DIAGNOSIS — Y9389 Activity, other specified: Secondary | ICD-10-CM | POA: Diagnosis not present

## 2016-07-14 DIAGNOSIS — Y9289 Other specified places as the place of occurrence of the external cause: Secondary | ICD-10-CM | POA: Insufficient documentation

## 2016-07-14 DIAGNOSIS — Y999 Unspecified external cause status: Secondary | ICD-10-CM | POA: Insufficient documentation

## 2016-07-14 DIAGNOSIS — W57XXXA Bitten or stung by nonvenomous insect and other nonvenomous arthropods, initial encounter: Secondary | ICD-10-CM | POA: Diagnosis not present

## 2016-07-14 DIAGNOSIS — S60562A Insect bite (nonvenomous) of left hand, initial encounter: Secondary | ICD-10-CM | POA: Insufficient documentation

## 2016-07-14 DIAGNOSIS — J45909 Unspecified asthma, uncomplicated: Secondary | ICD-10-CM | POA: Insufficient documentation

## 2016-07-14 DIAGNOSIS — F909 Attention-deficit hyperactivity disorder, unspecified type: Secondary | ICD-10-CM | POA: Diagnosis not present

## 2016-07-14 MED ORDER — TRAMADOL HCL 50 MG PO TABS: 50.0000 mg | ORAL_TABLET | Freq: Four times a day (QID) | ORAL | 0 refills | Status: DC | PRN

## 2016-07-14 MED ORDER — NAPROXEN 250 MG PO TABS
500.0000 mg | ORAL_TABLET | Freq: Once | ORAL | Status: AC
  Administered 2016-07-14: 500 mg via ORAL
  Filled 2016-07-14: qty 2

## 2016-07-14 MED ORDER — SULFAMETHOXAZOLE-TRIMETHOPRIM 800-160 MG PO TABS: 1.0000 | ORAL_TABLET | Freq: Two times a day (BID) | ORAL | 0 refills | Status: AC

## 2016-07-14 MED ORDER — SULFAMETHOXAZOLE-TRIMETHOPRIM 800-160 MG PO TABS
1.0000 | ORAL_TABLET | Freq: Once | ORAL | Status: AC
  Administered 2016-07-14: 1 via ORAL
  Filled 2016-07-14: qty 1

## 2016-07-14 NOTE — ED Provider Notes (Signed)
[facility] Provider Note   CSN: [id number] Arrival date & time: 07/14/16  2019     History   Chief Complaint Chief Complaint  Patient presents with  . Insect Bite    HPI Eugene Hicks is a 25 y.o. male presenting with left hand pain and swelling after being bit by a probable spider yesterday while working out at the families wood pile.  Father endorses he suspects a black widow spider but has not seen any such spiders.  Cristen describes localized non radiating constant pain.  He denies numbness in his finger tips.  There has been no drainage from the site.  He denies fevers, chills, nausea, vomiting or abdominal pain.  He also denies sob. He has had no treatment prior to arrival for this condition.  HPI  Past Medical History:  Diagnosis Date  . ADHD (attention deficit hyperactivity disorder)   . Asthma   . Back pain   . Shoulder pain     Patient Active Problem List   Diagnosis Date Noted  . Bipolar affective (HCC) 11/21/2013  . ADHD (attention deficit hyperactivity disorder)     Past Surgical History:  Procedure Laterality Date  . UPPER GI ENDOSCOPY         Home Medications    Prior to Admission medications   Medication Sig Start Date End Date Taking? Authorizing Provider  acetaminophen (TYLENOL) 500 MG tablet Take 500 mg by mouth every 6 (six) hours as needed for mild pain or moderate pain.    Historical Provider, MD  ALPRAZolam Prudy Feeler) 1 MG tablet Take 1 tablet twice daily for 7 days, then take 1/2 tablet twice daily for 14 days, then 1/2 tablet once daily for 7 days. Patient taking differently: Take 1 mg by mouth 2 (two) times daily.  10/28/14   Burgess Amor, PA-C  clindamycin (CLEOCIN) 300 MG capsule Take 1 capsule (300 mg total) by mouth 4 (four) times daily. For 7 days 03/15/16   Tammy Triplett, PA-C  EPINEPHrine (EPI-PEN) 0.3 mg/0.3 mL SOAJ injection Inject 0.3 mLs (0.3 mg total) into the muscle once. 07/26/13   Donnetta Hutching, MD  fluticasone (FLONASE) 50  MCG/ACT nasal spray Place 2 sprays into the nose daily.      Historical Provider, MD  lithium carbonate 300 MG capsule Take 300-600 mg by mouth 2 (two) times daily. Take one capsule (300mg ) every day in the morning, and take two capsules (600mg ) every day at bedtime    Historical Provider, MD  propranolol (INNOPRAN XL) 80 MG 24 hr capsule Take 80 mg by mouth daily.     Historical Provider, MD  QUEtiapine (SEROQUEL) 300 MG tablet Take 300 mg by mouth every evening.     Historical Provider, MD  sulfamethoxazole-trimethoprim (BACTRIM DS,SEPTRA DS) 800-160 MG tablet Take 1 tablet by mouth 2 (two) times daily. 07/14/16 07/21/16  Burgess Amor, PA-C  traMADol (ULTRAM) 50 MG tablet Take 1 tablet (50 mg total) by mouth every 6 (six) hours as needed. 07/14/16   Burgess Amor, PA-C    Family History Family History  Problem Relation Age of Onset  . Stroke Father   . Seizures Mother   . Seizures Brother     Social History Social History  Substance Use Topics  . Smoking status: Never Smoker  . Smokeless tobacco: Never Used  . Alcohol use No     Allergies   Codeine; Lactose intolerance (gi); Percocet [oxycodone-acetaminophen]; and Penicillins   Review of Systems Review of Systems  Constitutional:  Negative for chills and fever.  Respiratory: Negative for shortness of breath and wheezing.   Gastrointestinal: Negative for abdominal pain, nausea and vomiting.  Skin: Positive for wound. Negative for color change.  Neurological: Negative for numbness.     Physical Exam Updated Vital Signs BP (!) 114/52 (BP Location: Left Arm)   Pulse (!) 52   Temp 98.3 F (36.8 C) (Oral)   Resp 18   Ht 5\' 7"  (1.702 m)   Wt 68 kg   SpO2 100%   BMI 23.49 kg/m   Physical Exam  Constitutional: He appears well-developed and well-nourished.  HENT:  Head: Atraumatic.  Neck: Normal range of motion.  Cardiovascular:  Pulses equal bilaterally  Abdominal: Soft. There is no tenderness.  Musculoskeletal: He  exhibits edema and tenderness.  Left dorsal hand with soft edema, no edema.  No erythema.  No red streaking.  Several old appearing healing abrasions dorsal hand.    Neurological: He is alert. He has normal strength. He displays normal reflexes. No sensory deficit.  Skin: Skin is warm and dry.  Psychiatric: He has a normal mood and affect.     ED Treatments / Results  Labs (all labs ordered are listed, but only abnormal results are displayed) Labs Reviewed - No data to display  EKG  EKG Interpretation None       Radiology No results found.  Procedures Procedures (including critical care time)  Medications Ordered in ED Medications  sulfamethoxazole-trimethoprim (BACTRIM DS,SEPTRA DS) 800-160 MG per tablet 1 tablet (not administered)  naproxen (NAPROSYN) tablet 500 mg (not administered)     Initial Impression / Assessment and Plan / ED Course  I have reviewed the triage vital signs and the nursing notes.  Pertinent labs & imaging results that were available during my care of the patient were reviewed by me and considered in my medical decision making (see chart for details).  Clinical Course    Pt with edema left dorsal hand with no deformity, no induration or erythema or red streaking.  He was covered with abx, bactrim.  Tramadol prescribed for pain.  Warm compresses, f/u with pcp for a recheck if not improving with todays tx.  There is no constitutional sx to suggest black widow bite, no wound, eschar, blistering etc to suggest brown recluse.  He was advised close f/u either here or with pcp for any persistent or worsening sx.   Final Clinical Impressions(s) / ED Diagnoses   Final diagnoses:  Insect bite    New Prescriptions New Prescriptions   SULFAMETHOXAZOLE-TRIMETHOPRIM (BACTRIM DS,SEPTRA DS) 800-160 MG TABLET    Take 1 tablet by mouth 2 (two) times daily.   TRAMADOL (ULTRAM) 50 MG TABLET    Take 1 tablet (50 mg total) by mouth every 6 (six) hours as needed.       Burgess AmorJulie Bridgitte Felicetti, PA-C 07/14/16 [id number]    Shaune Pollackameron Isaacs, MD 07/15/16 [phone]

## 2016-07-14 NOTE — ED Triage Notes (Signed)
C/o insect bite to left hand yesterday. Swelling noted to left hand

## 2016-07-14 NOTE — Discharge Instructions (Signed)
Take your entire course of antibiotics.  Use warm compresses (wash cloth) or soak your hand I a warm pan of water twice daily for 10 minutes.

## 2016-07-18 ENCOUNTER — Emergency Department (HOSPITAL_COMMUNITY)
Admission: EM | Admit: 2016-07-18 | Discharge: 2016-07-18 | Disposition: A | Discharge status: Home / Self Care | Payer: Medicaid Other | Attending: Emergency Medicine | Admitting: Emergency Medicine

## 2016-07-18 ENCOUNTER — Encounter (HOSPITAL_COMMUNITY): Payer: Self-pay | Admitting: Emergency Medicine

## 2016-07-18 DIAGNOSIS — Z79899 Other long term (current) drug therapy: Secondary | ICD-10-CM | POA: Insufficient documentation

## 2016-07-18 DIAGNOSIS — K121 Other forms of stomatitis: Secondary | ICD-10-CM | POA: Diagnosis not present

## 2016-07-18 DIAGNOSIS — K0889 Other specified disorders of teeth and supporting structures: Secondary | ICD-10-CM

## 2016-07-18 DIAGNOSIS — F909 Attention-deficit hyperactivity disorder, unspecified type: Secondary | ICD-10-CM | POA: Diagnosis not present

## 2016-07-18 DIAGNOSIS — J45909 Unspecified asthma, uncomplicated: Secondary | ICD-10-CM | POA: Insufficient documentation

## 2016-07-18 DIAGNOSIS — K029 Dental caries, unspecified: Secondary | ICD-10-CM | POA: Diagnosis not present

## 2016-07-18 MED ORDER — MAGIC MOUTHWASH W/LIDOCAINE: 5.0000 mL | Freq: Three times a day (TID) | ORAL | 0 refills | Status: DC | PRN

## 2016-07-18 MED ORDER — CLINDAMYCIN HCL 300 MG PO CAPS: 300.0000 mg | ORAL_CAPSULE | Freq: Three times a day (TID) | ORAL | 0 refills | Status: DC

## 2016-07-18 NOTE — ED Triage Notes (Signed)
Patient c/o lower dental pain. Per father patient had referral to oral surgeon because he was told all of his teeth need to be removed but can not get anyone to call back. Per father patient has swelling of gums but no fevers. Father states patient recently seen for spider bite and given antibiotics in which they stopped after taking 4 doses due to making him nauseated.

## 2016-07-18 NOTE — ED Provider Notes (Signed)
[facility] Provider Note   CSN: [id number] Arrival date & time: 07/18/16  1610     History   Chief Complaint Chief Complaint  Patient presents with  . Dental Pain    HPI Eugene Hicks is a 25 y.o. male who presents to the ED with dental pain. The pain started today and is located in the lower front. Associated symptoms include oral ulcers on the inside of the lower lip. Patient was evaluated here a few days ago after a spider bite and prescribed Tramadol and antibiotic. Patient only took a couple doses of the antibiotic and then stopped due to nausea. He did take a Tramadol today when the pain started.   The history is provided by the patient. No language interpreter was used.  Dental Pain   This is a new problem. The current episode started 6 to 12 hours ago. The problem occurs constantly. The problem has been gradually worsening. The pain is at a severity of 6/10.    Past Medical History:  Diagnosis Date  . ADHD (attention deficit hyperactivity disorder)   . Asthma   . Back pain   . Shoulder pain     Patient Active Problem List   Diagnosis Date Noted  . Bipolar affective (HCC) 11/21/2013  . ADHD (attention deficit hyperactivity disorder)     Past Surgical History:  Procedure Laterality Date  . UPPER GI ENDOSCOPY         Home Medications    Prior to Admission medications   Medication Sig Start Date End Date Taking? Authorizing Provider  acetaminophen (TYLENOL) 500 MG tablet Take 500 mg by mouth every 6 (six) hours as needed for mild pain or moderate pain.    Historical Provider, MD  ALPRAZolam Prudy Feeler(XANAX) 1 MG tablet Take 1 tablet twice daily for 7 days, then take 1/2 tablet twice daily for 14 days, then 1/2 tablet once daily for 7 days. Patient taking differently: Take 1 mg by mouth 2 (two) times daily.  10/28/14   Burgess AmorJulie Idol, PA-C  clindamycin (CLEOCIN) 300 MG capsule Take 1 capsule (300 mg total) by mouth 3 (three) times daily. 07/18/16   Hawa Henly Orlene OchM Dakotah Heiman, NP   EPINEPHrine (EPI-PEN) 0.3 mg/0.3 mL SOAJ injection Inject 0.3 mLs (0.3 mg total) into the muscle once. 07/26/13   Donnetta HutchingBrian Cook, MD  fluticasone (FLONASE) 50 MCG/ACT nasal spray Place 2 sprays into the nose daily.      Historical Provider, MD  lithium carbonate 300 MG capsule Take 300-600 mg by mouth 2 (two) times daily. Take one capsule (300mg ) every day in the morning, and take two capsules (600mg ) every day at bedtime    Historical Provider, MD  magic mouthwash w/lidocaine SOLN Take 5 mLs by mouth 3 (three) times daily as needed for mouth pain (swish and spit). 07/18/16   Greenleigh Kauth Orlene OchM Abygale Karpf, NP  propranolol (INNOPRAN XL) 80 MG 24 hr capsule Take 80 mg by mouth daily.     Historical Provider, MD  QUEtiapine (SEROQUEL) 300 MG tablet Take 300 mg by mouth every evening.     Historical Provider, MD  sulfamethoxazole-trimethoprim (BACTRIM DS,SEPTRA DS) 800-160 MG tablet Take 1 tablet by mouth 2 (two) times daily. 07/14/16 07/21/16  Burgess AmorJulie Idol, PA-C  traMADol (ULTRAM) 50 MG tablet Take 1 tablet (50 mg total) by mouth every 6 (six) hours as needed. 07/14/16   Burgess AmorJulie Idol, PA-C    Family History Family History  Problem Relation Age of Onset  . Stroke Father   .  Seizures Mother   . Seizures Brother     Social History Social History  Substance Use Topics  . Smoking status: Never Smoker  . Smokeless tobacco: Never Used  . Alcohol use No     Allergies   Codeine; Lactose intolerance (gi); Percocet [oxycodone-acetaminophen]; and Penicillins   Review of Systems Review of Systems  Constitutional: Positive for chills. Negative for fever.  HENT: Positive for dental problem. Negative for trouble swallowing.        Oral ulcers  all other systems negative   Physical Exam Updated Vital Signs BP 130/60 (BP Location: Left Arm)   Pulse 62   Temp 97.8 F (36.6 C) (Oral)   Resp 18   Ht 5\' 8"  (1.727 m)   Wt 68 kg   SpO2 98%   BMI 22.81 kg/m   Physical Exam  Constitutional: He is oriented to person,  place, and time. He appears well-developed and well-nourished. No distress.  HENT:  Head: Normocephalic.  Mouth/Throat: Uvula is midline. Oral lesions present. No trismus in the jaw. Abnormal dentition. Dental caries present. No dental abscesses.  All of the lower front teeth are decayed. There are ulcer areas noted to the mucous membrane area of the bottom lip. There is erythema of the gums.   Eyes: EOM are normal.  Neck: Normal range of motion. Neck supple.  Cardiovascular: Normal rate.   Pulmonary/Chest: Effort normal.  Abdominal: He exhibits no distension.  Musculoskeletal: Normal range of motion.  Lymphadenopathy:    He has no cervical adenopathy.  Neurological: He is alert and oriented to person, place, and time. No cranial nerve deficit.  Skin: Skin is warm and dry.  Nursing note and vitals reviewed.    ED Treatments / Results  Labs (all labs ordered are listed, but only abnormal results are displayed) Labs Reviewed - No data to display   Radiology No results found.  Procedures Procedures (including critical care time)  Medications Ordered in ED Medications - No data to display   Initial Impression / Assessment and Plan / ED Course  I have reviewed the triage vital signs and the nursing notes.   Clinical Course    Final Clinical Impressions(s) / ED Diagnoses  25 y.o. 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 Patient's father reports that he will have the patient's PCP schedule f/u with an oral surgeon.  Final diagnoses:  Pain, dental  Mouth ulcers    New Prescriptions Discharge Medication List as of 07/18/2016  5:22 PM    clindamycin    [name], NP 07/18/16 2132    Maia Plan, MD 07/19/16 [phone]

## 2016-08-02 ENCOUNTER — Emergency Department (HOSPITAL_COMMUNITY)
Admission: EM | Admit: 2016-08-02 | Discharge: 2016-08-02 | Disposition: A | Discharge status: Home / Self Care | Payer: Medicaid Other | Attending: Emergency Medicine | Admitting: Emergency Medicine

## 2016-08-02 ENCOUNTER — Encounter (HOSPITAL_COMMUNITY): Payer: Self-pay | Admitting: Emergency Medicine

## 2016-08-02 DIAGNOSIS — Z792 Long term (current) use of antibiotics: Secondary | ICD-10-CM | POA: Diagnosis not present

## 2016-08-02 DIAGNOSIS — Z760 Encounter for issue of repeat prescription: Secondary | ICD-10-CM | POA: Insufficient documentation

## 2016-08-02 DIAGNOSIS — F909 Attention-deficit hyperactivity disorder, unspecified type: Secondary | ICD-10-CM | POA: Insufficient documentation

## 2016-08-02 DIAGNOSIS — J45909 Unspecified asthma, uncomplicated: Secondary | ICD-10-CM | POA: Diagnosis not present

## 2016-08-02 DIAGNOSIS — Z79899 Other long term (current) drug therapy: Secondary | ICD-10-CM | POA: Insufficient documentation

## 2016-08-02 MED ORDER — LITHIUM CARBONATE 300 MG PO CAPS
600.0000 mg | ORAL_CAPSULE | Freq: Once | ORAL | Status: AC
  Administered 2016-08-02: 600 mg via ORAL
  Filled 2016-08-02: qty 2

## 2016-08-02 NOTE — ED Provider Notes (Signed)
[facility] Provider Note   CSN: [id number] Arrival date & time: 08/02/16  2009     History   Chief Complaint Chief Complaint  Patient presents with  . out of meds    HPI CARSON BOGDEN is a 25 y.o. male.  Patient is a 25 year old male who presents to the emergency department with a request for a lithium tablet.  The patient and the patient's father states that the patient has a prescription for lithium that he is scheduled to get filled on tomorrow, Monday, October 16. He is out of his medication currently. Drugstores are closed at this time. The father states that when the patient's lithium level drops or he goes without his medication, he has multiple psychiatric related problems. He is requesting a dose of medication for tonight until the patient can get his prescription filled on tomorrow morning. The patient denies any homicidal or suicidal ideations.   The history is provided by the patient and a parent.    Past Medical History:  Diagnosis Date  . ADHD (attention deficit hyperactivity disorder)   . Asthma   . Back pain   . Shoulder pain     Patient Active Problem List   Diagnosis Date Noted  . Bipolar affective (HCC) 11/21/2013  . ADHD (attention deficit hyperactivity disorder)     Past Surgical History:  Procedure Laterality Date  . UPPER GI ENDOSCOPY         Home Medications    Prior to Admission medications   Medication Sig Start Date End Date Taking? Authorizing Provider  acetaminophen (TYLENOL) 500 MG tablet Take 500 mg by mouth every 6 (six) hours as needed for mild pain or moderate pain.    Historical Provider, MD  ALPRAZolam Prudy Feeler) 1 MG tablet Take 1 tablet twice daily for 7 days, then take 1/2 tablet twice daily for 14 days, then 1/2 tablet once daily for 7 days. Patient taking differently: Take 1 mg by mouth 2 (two) times daily.  10/28/14   Burgess Amor, PA-C  clindamycin (CLEOCIN) 300 MG capsule Take 1 capsule (300 mg total) by mouth 3  (three) times daily. 07/18/16   Hope Orlene Och, NP  EPINEPHrine (EPI-PEN) 0.3 mg/0.3 mL SOAJ injection Inject 0.3 mLs (0.3 mg total) into the muscle once. 07/26/13   Donnetta Hutching, MD  fluticasone (FLONASE) 50 MCG/ACT nasal spray Place 2 sprays into the nose daily.      Historical Provider, MD  lithium carbonate 300 MG capsule Take 300-600 mg by mouth 2 (two) times daily. Take one capsule (300mg ) every day in the morning, and take two capsules (600mg ) every day at bedtime    Historical Provider, MD  magic mouthwash w/lidocaine SOLN Take 5 mLs by mouth 3 (three) times daily as needed for mouth pain (swish and spit). 07/18/16   Hope Orlene Och, NP  propranolol (INNOPRAN XL) 80 MG 24 hr capsule Take 80 mg by mouth daily.     Historical Provider, MD  QUEtiapine (SEROQUEL) 300 MG tablet Take 300 mg by mouth every evening.     Historical Provider, MD  traMADol (ULTRAM) 50 MG tablet Take 1 tablet (50 mg total) by mouth every 6 (six) hours as needed. 07/14/16   Burgess Amor, PA-C    Family History Family History  Problem Relation Age of Onset  . Stroke Father   . Seizures Mother   . Seizures Brother     Social History Social History  Substance Use Topics  . Smoking status: Never  Smoker  . Smokeless tobacco: Never Used  . Alcohol use No     Allergies   Codeine; Lactose intolerance (gi); Percocet [oxycodone-acetaminophen]; and Penicillins   Review of Systems Review of Systems  Musculoskeletal: Positive for back pain.  Psychiatric/Behavioral: Positive for behavioral problems. Negative for hallucinations and suicidal ideas.       Bipolar issues  All other systems reviewed and are negative.    Physical Exam Updated Vital Signs BP 122/65 (BP Location: Left Arm)   Pulse 61   Temp 97.7 F (36.5 C) (Oral)   Resp 20   Ht 5\' 8"  (1.727 m)   Wt 68 kg   SpO2 100%   BMI 22.81 kg/m   Physical Exam  Constitutional: Vital signs are normal. He appears well-developed and well-nourished. He is active.    HENT:  Head: Normocephalic and atraumatic.  Right Ear: Tympanic membrane, external ear and ear canal normal.  Left Ear: Tympanic membrane, external ear and ear canal normal.  Nose: Nose normal.  Mouth/Throat: Uvula is midline, oropharynx is clear and moist and mucous membranes are normal.  Eyes: Conjunctivae, EOM and lids are normal. Pupils are equal, round, and reactive to light.  Neck: Trachea normal, normal range of motion and phonation normal. Neck supple. Carotid bruit is not present.  Cardiovascular: Normal rate, regular rhythm and normal pulses.   Abdominal: Soft. Normal appearance and bowel sounds are normal.  Lymphadenopathy:       Head (right side): No submental, no preauricular and no posterior auricular adenopathy present.       Head (left side): No submental, no preauricular and no posterior auricular adenopathy present.    He has no cervical adenopathy.  Neurological: He is alert. He has normal strength. No cranial nerve deficit or sensory deficit. GCS eye subscore is 4. GCS verbal subscore is 5. GCS motor subscore is 6.  Skin: Skin is warm and dry.  Psychiatric: His speech is normal.  Suicidal or homicidal ideations. Patient is awake and alert oriented. No noted complaint of visual or auditory hallucinations.     ED Treatments / Results  Labs (all labs ordered are listed, but only abnormal results are displayed) Labs Reviewed - No data to display  EKG  EKG Interpretation None       Radiology No results found.  Procedures Procedures (including critical care time)  Medications Ordered in ED Medications  lithium carbonate capsule 600 mg (not administered)     Initial Impression / Assessment and Plan / ED Course  Case discussed with Dr Clarene DukeMcManus.   I have reviewed the triage vital signs and the nursing notes.  Pertinent labs & imaging results that were available during my care of the patient were reviewed by me and considered in my medical decision making  (see chart for details).  Clinical Course    **I have reviewed nursing notes, vital signs, and all appropriate lab and imaging results for this patient.*  Final Clinical Impressions(s) / ED Diagnoses  Vital signs within normal limits. Patient provided with evening dose of lithium 600 mg. Patient has prescription to be refilled on tomorrow morning. Patient advised to see his primary provider for additional follow-up and for monitoring of his lithium levels.    Final diagnoses:  None    New Prescriptions New Prescriptions   No medications on file     Ivery QualeHobson Warden Buffa, PA-C 08/02/16 2225    Theoren JesterKathleen McManus, DO 08/04/16 2022

## 2016-08-02 NOTE — Discharge Instructions (Signed)
Please see your primary clinician to monitor your lithium levels. Please get your prescription filled on tomorrow, and use the medication as prescribed.

## 2016-08-02 NOTE — ED Triage Notes (Signed)
Per pt's father, pt is out of his Lithium tablets and cannot get a refill filled until in morning by pharmacy. Dad is worried that pt will have an "episode" if pt is not given a Lithium pill to take tonight.

## 2016-08-04 ENCOUNTER — Encounter (HOSPITAL_COMMUNITY): Payer: Self-pay | Admitting: Emergency Medicine

## 2016-08-04 ENCOUNTER — Emergency Department (HOSPITAL_COMMUNITY)
Admission: EM | Admit: 2016-08-04 | Discharge: 2016-08-04 | Discharge status: Against Medical Advice | Payer: Medicaid Other | Attending: Emergency Medicine | Admitting: Emergency Medicine

## 2016-08-04 DIAGNOSIS — J45909 Unspecified asthma, uncomplicated: Secondary | ICD-10-CM | POA: Insufficient documentation

## 2016-08-04 DIAGNOSIS — F909 Attention-deficit hyperactivity disorder, unspecified type: Secondary | ICD-10-CM | POA: Insufficient documentation

## 2016-08-04 DIAGNOSIS — T56892A Toxic effect of other metals, intentional self-harm, initial encounter: Secondary | ICD-10-CM | POA: Diagnosis present

## 2016-08-04 DIAGNOSIS — Z79899 Other long term (current) drug therapy: Secondary | ICD-10-CM | POA: Diagnosis not present

## 2016-08-04 DIAGNOSIS — Z792 Long term (current) use of antibiotics: Secondary | ICD-10-CM | POA: Insufficient documentation

## 2016-08-04 LAB — COMPREHENSIVE METABOLIC PANEL
ALBUMIN: 4.5 g/dL (ref 3.5–5.0)
ALK PHOS: 85 U/L (ref 38–126)
ALT: 30 U/L (ref 17–63)
AST: 25 U/L (ref 15–41)
Anion gap: 4 — ABNORMAL LOW (ref 5–15)
BILIRUBIN TOTAL: 0.9 mg/dL (ref 0.3–1.2)
BUN: 10 mg/dL (ref 6–20)
CALCIUM: 9.1 mg/dL (ref 8.9–10.3)
CO2: 26 mmol/L (ref 22–32)
Chloride: 108 mmol/L (ref 101–111)
Creatinine, Ser: 0.86 mg/dL (ref 0.61–1.24)
GFR calc Af Amer: >60 mL/min (ref >60)
GFR calc non Af Amer: >60 mL/min (ref >60)
GLUCOSE: 87 mg/dL (ref 65–99)
Potassium: 3.6 mmol/L (ref 3.5–5.1)
SODIUM: 138 mmol/L (ref 135–145)
TOTAL PROTEIN: 7 g/dL (ref 6.5–8.1)

## 2016-08-04 LAB — CBC WITH DIFFERENTIAL/PLATELET
BASOS PCT: 0 %
Basophils Absolute: 0 10*3/uL (ref 0.0–0.1)
EOS PCT: 6 %
Eosinophils Absolute: 0.4 10*3/uL (ref 0.0–0.7)
HEMATOCRIT: 34.4 % — AB (ref 39.0–52.0)
HEMOGLOBIN: 10.9 g/dL — AB (ref 13.0–17.0)
LYMPHS PCT: 25 %
Lymphs Abs: 1.8 10*3/uL (ref 0.7–4.0)
MCH: 20 pg — ABNORMAL LOW (ref 26.0–34.0)
MCHC: 31.7 g/dL (ref 30.0–36.0)
MCV: 63 fL — ABNORMAL LOW (ref 78.0–100.0)
MONOS PCT: 5 %
Monocytes Absolute: 0.4 10*3/uL (ref 0.1–1.0)
NEUTROS PCT: 64 %
Neutro Abs: 4.5 10*3/uL (ref 1.7–7.7)
Platelets: 187 10*3/uL (ref 150–400)
RBC: 5.46 MIL/uL (ref 4.22–5.81)
RDW: 15.4 % (ref 11.5–15.5)
WBC: 7.1 10*3/uL (ref 4.0–10.5)

## 2016-08-04 LAB — RAPID URINE DRUG SCREEN, HOSP PERFORMED
AMPHETAMINES: NOT DETECTED
BARBITURATES: NOT DETECTED
Benzodiazepines: POSITIVE — AB
Cocaine: NOT DETECTED
Opiates: NOT DETECTED
Tetrahydrocannabinol: NOT DETECTED

## 2016-08-04 LAB — LITHIUM LEVEL: LITHIUM LVL: 0.78 mmol/L (ref 0.60–1.20)

## 2016-08-04 LAB — ETHANOL: ALCOHOL ETHYL (B): 8 mg/dL — AB (ref <5)

## 2016-08-04 MED ORDER — LITHIUM CARBONATE 300 MG PO CAPS
300.0000 mg | ORAL_CAPSULE | Freq: Once | ORAL | Status: AC
  Administered 2016-08-04: 300 mg via ORAL
  Filled 2016-08-04: qty 1

## 2016-08-04 NOTE — ED Triage Notes (Signed)
Pt had his dose of lithium reduced. Father states pt has been dizzy and is out of his lithium. Rx at pharmacy but father states he cannot pick it up. Father states pt has c/o headaches.

## 2016-08-04 NOTE — ED Notes (Signed)
Pt father called this nurse to room requesting to speak with Dr. Rock NephewPt father expresses multiple times that his "son needs his lithium medication increased back to the way it was and it is not a narcotic, so why couldn't we just give it to him?"  Explained that the ED physician does not typically change psych medications, the pt psychiatrist does this and we have an order in place for a tele psych evaluation.  Informed pt father that I would let Dr Adriana Simasook know he desired to speak with him again.

## 2016-08-04 NOTE — ED Notes (Addendum)
Pt father called this nurse to room asking when the Dr was going to be in and how long it would be...explained there was no way of me knowing this information, but ensured I had relayed the message from earlier that he desired to speak with ED Dr.  Rock NephewPt father got upset and storms out of room telling the pt to come with him and that they are leaving. Pt up to nurses station asking where he signs, pt informed of risks of leaving and signs out AMA. Dr Adriana Simasook made aware.

## 2016-08-04 NOTE — ED Notes (Signed)
Father wants to leave and take son home. Wait explained was for telepsychiatry consult to assist in evaluation of patient's medications. Agreed to stay. Father is afraid son will die if he does not get his medicine changed back to how it was prescribed until Dr. Omelia BlackwaterHeaden changed it this past week. He states repeatedly that his son will die if he does not get his medicine back to how it was.

## 2016-08-05 NOTE — ED Provider Notes (Addendum)
[facility] DEPT Provider Note   CSN: [id number] Arrival date & time: 08/04/16  1436     History   Chief Complaint Chief Complaint  Patient presents with  . Medication withdrawal    HPI Eugene Hicks is a 25 y.o. male.  Regular visitor to the emergency department presents with a concern about his medication. Most of history obtained from his father. He apparently has had medication changes recently with his lithium and Seroquel and propranolol.  Both the lithium and propranolol have been decreased. Father states that he thinks he does better when his lithium is administered at a higher dose. He sees a psychiatrist "Dr. Omelia BlackwaterHeaden" [place].  No suicidal or homicidal ideation. No gross behavioral changes.      Past Medical History:  Diagnosis Date  . ADHD (attention deficit hyperactivity disorder)   . Asthma   . Back pain   . Shoulder pain     Patient Active Problem List   Diagnosis Date Noted  . Bipolar affective (HCC) 11/21/2013  . ADHD (attention deficit hyperactivity disorder)     Past Surgical History:  Procedure Laterality Date  . UPPER GI ENDOSCOPY         Home Medications    Prior to Admission medications   Medication Sig Start Date End Date Taking? Authorizing Provider  lithium carbonate 300 MG capsule Take 300 mg by mouth 2 (two) times daily.    Yes Historical Provider, MD  acetaminophen (TYLENOL) 500 MG tablet Take 500 mg by mouth every 6 (six) hours as needed for mild pain or moderate pain.    Historical Provider, MD  ALPRAZolam Prudy Feeler(XANAX) 1 MG tablet Take 1 tablet twice daily for 7 days, then take 1/2 tablet twice daily for 14 days, then 1/2 tablet once daily for 7 days. Patient taking differently: Take 1 mg by mouth 2 (two) times daily.  10/28/14   Burgess AmorJulie Idol, PA-C  clindamycin (CLEOCIN) 300 MG capsule Take 1 capsule (300 mg total) by mouth 3 (three) times daily. 07/18/16   Hope Orlene OchM Neese, NP  EPINEPHrine (EPI-PEN) 0.3 mg/0.3 mL SOAJ injection  Inject 0.3 mLs (0.3 mg total) into the muscle once. 07/26/13   Donnetta HutchingBrian Infinity Jeffords, MD  fluticasone (FLONASE) 50 MCG/ACT nasal spray Place 2 sprays into the nose daily.      Historical Provider, MD  magic mouthwash w/lidocaine SOLN Take 5 mLs by mouth 3 (three) times daily as needed for mouth pain (swish and spit). 07/18/16   Hope Orlene OchM Neese, NP  propranolol (INNOPRAN XL) 80 MG 24 hr capsule Take 80 mg by mouth daily.     Historical Provider, MD  QUEtiapine (SEROQUEL) 300 MG tablet Take 300 mg by mouth every evening.     Historical Provider, MD  traMADol (ULTRAM) 50 MG tablet Take 1 tablet (50 mg total) by mouth every 6 (six) hours as needed. 07/14/16   Burgess AmorJulie Idol, PA-C    Family History Family History  Problem Relation Age of Onset  . Stroke Father   . Seizures Mother   . Seizures Brother     Social History Social History  Substance Use Topics  . Smoking status: Never Smoker  . Smokeless tobacco: Never Used  . Alcohol use No     Allergies   Codeine; Lactose intolerance (gi); Percocet [oxycodone-acetaminophen]; and Penicillins   Review of Systems Review of Systems  All other systems reviewed and are negative.    Physical Exam Updated Vital Signs BP 95/65   Pulse 60  Temp 97.9 F (36.6 C) (Oral)   Resp 14   Ht 5\' 8"  (1.727 m)   Wt 150 lb (68 kg)   SpO2 100%   BMI 22.81 kg/m   Physical Exam  Constitutional: He is oriented to person, place, and time.  Appears his normal self  HENT:  Head: Normocephalic and atraumatic.  Eyes: Conjunctivae are normal.  Neck: Neck supple.  Cardiovascular: Normal rate and regular rhythm.   Pulmonary/Chest: Effort normal and breath sounds normal.  Abdominal: Soft. Bowel sounds are normal.  Musculoskeletal: Normal range of motion.  Neurological: He is alert and oriented to person, place, and time.  Skin: Skin is warm and dry.  Psychiatric:  Flat affect  Nursing note and vitals reviewed.    ED Treatments / Results  Labs (all labs ordered  are listed, but only abnormal results are displayed) Labs Reviewed  CBC WITH DIFFERENTIAL/PLATELET - Abnormal; Notable for the following:       Result Value   Hemoglobin 10.9 (*)    HCT 34.4 (*)    MCV 63.0 (*)    MCH 20.0 (*)    All other components within normal limits  COMPREHENSIVE METABOLIC PANEL - Abnormal; Notable for the following:    Anion gap 4 (*)    All other components within normal limits  RAPID URINE DRUG SCREEN, HOSP PERFORMED - Abnormal; Notable for the following:    Benzodiazepines POSITIVE (*)    All other components within normal limits  ETHANOL - Abnormal; Notable for the following:    Alcohol, Ethyl (B) 8 (*)    All other components within normal limits  LITHIUM LEVEL    EKG  EKG Interpretation None       Radiology No results found.  Procedures Procedures (including critical care time)  Medications Ordered in ED Medications  lithium carbonate capsule 300 mg (300 mg Oral Given 08/04/16 1738)     Initial Impression / Assessment and Plan / ED Course  I have reviewed the triage vital signs and the nursing notes.  Pertinent labs & imaging results that were available during my care of the patient were reviewed by me and considered in my medical decision making (see chart for details).  Clinical Course    Patient is regular visitor to the ED. He has a care plan. He appears to be his normal presentation. Lithium level was slightly subtherapeutic. He was given 1 dose of lithium.  Patient was encouraged to follow-up with his regular therapist  Final Clinical Impressions(s) / ED Diagnoses   Final diagnoses:  None    New Prescriptions Discharge Medication List as of 08/04/2016  8:06 PM       Donnetta Hutching, MD 08/05/16 1639    Donnetta Hutching, MD 08/05/16 [phone]

## 2016-09-15 ENCOUNTER — Other Ambulatory Visit (HOSPITAL_COMMUNITY)
Admission: RE | Admit: 2016-09-15 | Discharge: 2016-09-15 | Disposition: A | Discharge status: Home / Self Care | Payer: Medicaid Other | Source: Ambulatory Visit | Attending: *Deleted | Admitting: *Deleted

## 2016-09-15 DIAGNOSIS — F3131 Bipolar disorder, current episode depressed, mild: Secondary | ICD-10-CM | POA: Diagnosis present

## 2016-09-15 LAB — BASIC METABOLIC PANEL
ANION GAP: 9 (ref 5–15)
BUN: 11 mg/dL (ref 6–20)
CALCIUM: 9.7 mg/dL (ref 8.9–10.3)
CO2: 27 mmol/L (ref 22–32)
CREATININE: 0.89 mg/dL (ref 0.61–1.24)
Chloride: 104 mmol/L (ref 101–111)
GFR calc Af Amer: >60 mL/min (ref >60)
GLUCOSE: 94 mg/dL (ref 65–99)
Potassium: 3.4 mmol/L — ABNORMAL LOW (ref 3.5–5.1)
Sodium: 140 mmol/L (ref 135–145)

## 2016-09-15 LAB — TSH: TSH: 4.377 u[IU]/mL (ref 0.350–4.500)

## 2016-09-15 LAB — LITHIUM LEVEL: LITHIUM LVL: 0.57 mmol/L — AB (ref 0.60–1.20)

## 2016-09-16 ENCOUNTER — Emergency Department (HOSPITAL_COMMUNITY)
Admission: EM | Admit: 2016-09-16 | Discharge: 2016-09-16 | Disposition: A | Discharge status: Home / Self Care | Payer: Medicaid Other | Attending: Emergency Medicine | Admitting: Emergency Medicine

## 2016-09-16 ENCOUNTER — Encounter (HOSPITAL_COMMUNITY): Payer: Self-pay | Admitting: *Deleted

## 2016-09-16 DIAGNOSIS — H6123 Impacted cerumen, bilateral: Secondary | ICD-10-CM | POA: Diagnosis not present

## 2016-09-16 DIAGNOSIS — F909 Attention-deficit hyperactivity disorder, unspecified type: Secondary | ICD-10-CM | POA: Diagnosis not present

## 2016-09-16 DIAGNOSIS — Z79899 Other long term (current) drug therapy: Secondary | ICD-10-CM | POA: Insufficient documentation

## 2016-09-16 DIAGNOSIS — J45909 Unspecified asthma, uncomplicated: Secondary | ICD-10-CM | POA: Diagnosis not present

## 2016-09-16 DIAGNOSIS — H938X2 Other specified disorders of left ear: Secondary | ICD-10-CM | POA: Diagnosis present

## 2016-09-16 MED ORDER — ACETAMINOPHEN 325 MG PO TABS
650.0000 mg | ORAL_TABLET | Freq: Once | ORAL | Status: AC
  Administered 2016-09-16: 650 mg via ORAL
  Filled 2016-09-16: qty 2

## 2016-09-16 MED ORDER — CARBAMIDE PEROXIDE 6.5 % OT SOLN
5.0000 [drp] | Freq: Once | OTIC | Status: AC
  Administered 2016-09-16: 5 [drp] via OTIC
  Filled 2016-09-16: qty 15

## 2016-09-16 NOTE — ED Provider Notes (Signed)
[facility] Provider Note   CSN: [id number] Arrival date & time: 09/16/16  1045     History   Chief Complaint Chief Complaint  Patient presents with  . Ear Fullness    HPI Eugene Hicks is a 25 y.o. male.  HPI   Eugene Hicks is a 25 y.o. male who presents to the Emergency Department complaining of fullness and decreased hearing to the bilateral ear canals.  Symptoms worse in the left ear.  Father of the patient states patient has been using Q tips in his ears.  He has not tried any therapies.  Pt admits to intermittent dizziness associated with position change.  He denies drainage, fever, neck pain, headache and vomiting.     Past Medical History:  Diagnosis Date  . ADHD (attention deficit hyperactivity disorder)   . Asthma   . Back pain   . Shoulder pain     Patient Active Problem List   Diagnosis Date Noted  . Bipolar affective (HCC) 11/21/2013  . ADHD (attention deficit hyperactivity disorder)     Past Surgical History:  Procedure Laterality Date  . UPPER GI ENDOSCOPY         Home Medications    Prior to Admission medications   Medication Sig Start Date End Date Taking? Authorizing Provider  acetaminophen (TYLENOL) 500 MG tablet Take 500 mg by mouth every 6 (six) hours as needed for mild pain or moderate pain.    Historical Provider, MD  ALPRAZolam Prudy Feeler(XANAX) 1 MG tablet Take 1 tablet twice daily for 7 days, then take 1/2 tablet twice daily for 14 days, then 1/2 tablet once daily for 7 days. Patient taking differently: Take 1 mg by mouth 2 (two) times daily.  10/28/14   Burgess AmorJulie Idol, PA-C  clindamycin (CLEOCIN) 300 MG capsule Take 1 capsule (300 mg total) by mouth 3 (three) times daily. 07/18/16   Hope Orlene OchM Neese, NP  EPINEPHrine (EPI-PEN) 0.3 mg/0.3 mL SOAJ injection Inject 0.3 mLs (0.3 mg total) into the muscle once. 07/26/13   Donnetta HutchingBrian Cook, MD  fluticasone (FLONASE) 50 MCG/ACT nasal spray Place 2 sprays into the nose daily.      Historical Provider, MD    lithium carbonate 300 MG capsule Take 300 mg by mouth 2 (two) times daily.     Historical Provider, MD  magic mouthwash w/lidocaine SOLN Take 5 mLs by mouth 3 (three) times daily as needed for mouth pain (swish and spit). 07/18/16   Hope Orlene OchM Neese, NP  propranolol (INNOPRAN XL) 80 MG 24 hr capsule Take 80 mg by mouth daily.     Historical Provider, MD  QUEtiapine (SEROQUEL) 300 MG tablet Take 300 mg by mouth every evening.     Historical Provider, MD  traMADol (ULTRAM) 50 MG tablet Take 1 tablet (50 mg total) by mouth every 6 (six) hours as needed. 07/14/16   Burgess AmorJulie Idol, PA-C    Family History Family History  Problem Relation Age of Onset  . Stroke Father   . Seizures Mother   . Seizures Brother     Social History Social History  Substance Use Topics  . Smoking status: Never Smoker  . Smokeless tobacco: Never Used  . Alcohol use No     Allergies   Codeine; Lactose intolerance (gi); Percocet [oxycodone-acetaminophen]; and Penicillins   Review of Systems Review of Systems  Constitutional: Negative for appetite change, chills and fever.  HENT: Positive for ear pain and hearing loss. Negative for congestion, ear discharge and trouble  swallowing.   Eyes: Negative for visual disturbance.  Respiratory: Negative for cough.   Gastrointestinal: Negative for nausea and vomiting.  Musculoskeletal: Negative for myalgias, neck pain and neck stiffness.  Skin: Negative for rash.  Neurological: Positive for dizziness. Negative for syncope, weakness, numbness and headaches.     Physical Exam Updated Vital Signs BP (!) 111/43 (BP Location: Left Arm)   Pulse (!) 47 Comment: right radial pulse 48.   Temp 97.7 F (36.5 C) (Oral)   Resp 18   Ht 5\' 8"  (1.727 m)   Wt 70.8 kg   BMI 23.72 kg/m   Physical Exam  Constitutional: He is oriented to person, place, and time. He appears well-developed and well-nourished. No distress.  HENT:  Head: Atraumatic.  Right Ear: No decreased hearing is  noted.  Left Ear: Decreased hearing is noted.  Mouth/Throat: Oropharynx is clear and moist.  Bilateral cerumen impaction to the canals.  Unable to visualize TM's  Cardiovascular: Normal rate and regular rhythm.   Pulmonary/Chest: Effort normal and breath sounds normal. No respiratory distress.  Musculoskeletal: Normal range of motion.  Neurological: He is alert and oriented to person, place, and time.  Skin: Skin is warm. No rash noted.  Psychiatric: He has a normal mood and affect.  Nursing note and vitals reviewed.    ED Treatments / Results  Labs (all labs ordered are listed, but only abnormal results are displayed) Labs Reviewed - No data to display  EKG  EKG Interpretation None       Radiology No results found.  Procedures Procedures (including critical care time)  Medications Ordered in ED Medications  carbamide peroxide (DEBROX) 6.5 % otic solution 5 drop (5 drops Both Ears Given 09/16/16 1232)  acetaminophen (TYLENOL) tablet 650 mg (650 mg Oral Given 09/16/16 1232)     Initial Impression / Assessment and Plan / ED Course  I have reviewed the triage vital signs and the nursing notes.  Pertinent labs & imaging results that were available during my care of the patient were reviewed by me and considered in my medical decision making (see chart for details).  Clinical Course    Patient is feeling better. Cerumen irrigated from the left ear canal by nursing staff. Symptoms have improved, small amount of cerumen remains to right ear canal but overall pt is feeling better.  TM's now visualized and appear intact  Bradycardia but asymptomatic.  Currently taking propranolol.  Pt ambulates with steady gait, no focal neuro deficits.  Pt is well known to me and appears at his neuro baseline.  Appears stable for d/c Debrox dispensed.  Pt counseled to d/c using Q tips  Final Clinical Impressions(s) / ED Diagnoses   Final diagnoses:  Bilateral impacted cerumen    New  Prescriptions New Prescriptions   No medications on file     Pauline Ausammy Beautifull Cisar, PA-C 09/17/16 1932    Donnetta HutchingBrian Cook, MD 09/28/16 [phone]

## 2016-09-16 NOTE — ED Triage Notes (Addendum)
Pt's family member reports pt uses q-tips very often to clean out his ears and starting last night pt reports having a hard time hearing out of his left ear. No ear drainage. Denies pain. HR in the 40's, no hx of bradycardia per father despite Propranolol usage. Father reports they have recently reduced his Lithium from 3x daily to 2x daily and reduced his Propranolol dosage and that pt "hasn't been feeling right" ever since. Father reports pt was at lab yesterday to have Lithium levels drawn. Pt denies dizziness and weakness.

## 2016-09-16 NOTE — Discharge Instructions (Signed)
Apply 3 drops of the Debrox to the right ear canal 3 times a day for 3-4 days as needed for ear wax. Follow up with his primary care doctor if needed.

## 2016-12-03 ENCOUNTER — Emergency Department (HOSPITAL_COMMUNITY): Payer: Medicaid Other

## 2016-12-03 ENCOUNTER — Encounter (HOSPITAL_COMMUNITY): Payer: Self-pay

## 2016-12-03 ENCOUNTER — Emergency Department (HOSPITAL_COMMUNITY)
Admission: EM | Admit: 2016-12-03 | Discharge: 2016-12-03 | Disposition: A | Discharge status: Home / Self Care | Payer: Medicaid Other | Attending: Emergency Medicine | Admitting: Emergency Medicine

## 2016-12-03 DIAGNOSIS — S91209A Unspecified open wound of unspecified toe(s) with damage to nail, initial encounter: Secondary | ICD-10-CM

## 2016-12-03 DIAGNOSIS — S91202A Unspecified open wound of left great toe with damage to nail, initial encounter: Secondary | ICD-10-CM | POA: Insufficient documentation

## 2016-12-03 DIAGNOSIS — F909 Attention-deficit hyperactivity disorder, unspecified type: Secondary | ICD-10-CM | POA: Diagnosis not present

## 2016-12-03 DIAGNOSIS — Y999 Unspecified external cause status: Secondary | ICD-10-CM | POA: Insufficient documentation

## 2016-12-03 DIAGNOSIS — Z79899 Other long term (current) drug therapy: Secondary | ICD-10-CM | POA: Insufficient documentation

## 2016-12-03 DIAGNOSIS — Y929 Unspecified place or not applicable: Secondary | ICD-10-CM | POA: Diagnosis not present

## 2016-12-03 DIAGNOSIS — W208XXA Other cause of strike by thrown, projected or falling object, initial encounter: Secondary | ICD-10-CM | POA: Diagnosis not present

## 2016-12-03 DIAGNOSIS — J45909 Unspecified asthma, uncomplicated: Secondary | ICD-10-CM | POA: Insufficient documentation

## 2016-12-03 DIAGNOSIS — Y9389 Activity, other specified: Secondary | ICD-10-CM | POA: Diagnosis not present

## 2016-12-03 MED ORDER — LIDOCAINE HCL (PF) 2 % IJ SOLN
10.0000 mL | Freq: Once | INTRAMUSCULAR | Status: AC
  Administered 2016-12-03: 10 mL via INTRADERMAL
  Filled 2016-12-03: qty 10

## 2016-12-03 MED ORDER — HYDROCODONE-ACETAMINOPHEN 5-325 MG PO TABS
1.0000 | ORAL_TABLET | Freq: Once | ORAL | Status: AC
  Administered 2016-12-03: 1 via ORAL
  Filled 2016-12-03: qty 1

## 2016-12-03 MED ORDER — HYDROCODONE-ACETAMINOPHEN 5-325 MG PO TABS: ORAL_TABLET | ORAL | 0 refills | Status: DC

## 2016-12-03 NOTE — ED Provider Notes (Signed)
[facility] Provider Note   CSN: [id number] Arrival date & time: 12/03/16  1434     History   Chief Complaint Chief Complaint  Patient presents with  . Toe Pain    HPI Eugene Hicks is a 26 y.o. male.  HPI   Eugene Hicks is a 26 y.o. male who presents to the Emergency Department complaining of crush injury to the left great toe.  He states that he was helping his dad unload firewood from a truck, when a large log fell on his foot.  He states that it ripped his toenail off.  He complains of throbbing pain to the toe. He reports minimal bleeding.  He denies numbness, other injuries, or swelling.    Past Medical History:  Diagnosis Date  . ADHD (attention deficit hyperactivity disorder)   . Asthma   . Back pain   . Shoulder pain     Patient Active Problem List   Diagnosis Date Noted  . Bipolar affective (HCC) 11/21/2013  . ADHD (attention deficit hyperactivity disorder)     Past Surgical History:  Procedure Laterality Date  . UPPER GI ENDOSCOPY         Home Medications    Prior to Admission medications   Medication Sig Start Date End Date Taking? Authorizing Provider  acetaminophen (TYLENOL) 500 MG tablet Take 500 mg by mouth every 6 (six) hours as needed for mild pain or moderate pain.    Historical Provider, MD  ALPRAZolam Prudy Feeler) 1 MG tablet Take 1 tablet twice daily for 7 days, then take 1/2 tablet twice daily for 14 days, then 1/2 tablet once daily for 7 days. Patient taking differently: Take 1 mg by mouth 2 (two) times daily.  10/28/14   Burgess Amor, PA-C  clindamycin (CLEOCIN) 300 MG capsule Take 1 capsule (300 mg total) by mouth 3 (three) times daily. 07/18/16   Hope Orlene Och, NP  EPINEPHrine (EPI-PEN) 0.3 mg/0.3 mL SOAJ injection Inject 0.3 mLs (0.3 mg total) into the muscle once. 07/26/13   Donnetta Hutching, MD  fluticasone (FLONASE) 50 MCG/ACT nasal spray Place 2 sprays into the nose daily.      Historical Provider, MD  HYDROcodone-acetaminophen  (NORCO/VICODIN) 5-325 MG tablet Take one-two tabs po q 4-6 hrs prn pain 12/03/16   Laurella Tull, PA-C  lithium carbonate 300 MG capsule Take 300 mg by mouth 2 (two) times daily.     Historical Provider, MD  magic mouthwash w/lidocaine SOLN Take 5 mLs by mouth 3 (three) times daily as needed for mouth pain (swish and spit). 07/18/16   Hope Orlene Och, NP  propranolol (INNOPRAN XL) 80 MG 24 hr capsule Take 80 mg by mouth daily.     Historical Provider, MD  QUEtiapine (SEROQUEL) 300 MG tablet Take 300 mg by mouth every evening.     Historical Provider, MD  traMADol (ULTRAM) 50 MG tablet Take 1 tablet (50 mg total) by mouth every 6 (six) hours as needed. 07/14/16   Burgess Amor, PA-C    Family History Family History  Problem Relation Age of Onset  . Stroke Father   . Seizures Mother   . Seizures Brother     Social History Social History  Substance Use Topics  . Smoking status: Never Smoker  . Smokeless tobacco: Never Used  . Alcohol use No     Allergies   Codeine; Lactose intolerance (gi); Percocet [oxycodone-acetaminophen]; and Penicillins   Review of Systems Review of Systems  Constitutional: Negative for chills  and fever.  Musculoskeletal: Negative for arthralgias (left great toe pain), back pain and joint swelling.  Skin: Positive for wound (toenail pain).       Laceration   Neurological: Negative for dizziness, weakness and numbness.  Hematological: Does not bruise/bleed easily.  All other systems reviewed and are negative.    Physical Exam Updated Vital Signs BP 108/68 (BP Location: Right Arm)   Pulse 78   Temp 97.9 F (36.6 C) (Oral)   Resp 16   SpO2 100%   Physical Exam  Constitutional: He is oriented to person, place, and time. He appears well-developed and well-nourished. No distress.  HENT:  Head: Normocephalic and atraumatic.  Cardiovascular: Normal rate, regular rhythm and intact distal pulses.   No murmur heard. Pulmonary/Chest: Effort normal and breath  sounds normal. No respiratory distress.  Musculoskeletal: He exhibits tenderness. He exhibits no edema or deformity.  Avulsion of the left great toenail. Bleeding controlled.  Nail bed intact. No bony deformity.  No tenderness proximal to the nail.    Neurological: He is alert and oriented to person, place, and time. He exhibits normal muscle tone. Coordination normal.  Skin: Skin is warm.  Nursing note and vitals reviewed.    ED Treatments / Results  Labs (all labs ordered are listed, but only abnormal results are displayed) Labs Reviewed - No data to display  EKG  EKG Interpretation None       Radiology Dg Toe Great Left  Result Date: 12/03/2016 CLINICAL DATA:  Dropped log on first toe with pain and swelling, initial encounter EXAM: LEFT GREAT TOE COMPARISON:  04/29/2015 FINDINGS: No acute fracture or dislocation is noted. Soft tissue changes are noted in the region of the nail bed consistent with the given clinical history. No other focal abnormality is noted. IMPRESSION: Soft tissue injury without acute bony abnormality. Electronically Signed   By: Alcide Clever M.D.   On: 12/03/2016 16:09    Procedures .Nail Removal Date/Time: 12/03/2016 5:12 PM Performed by: [name] Authorized by: [name]   Consent:    Consent obtained:  Verbal   Consent given by:  Patient and parent   Risks discussed:  Infection, pain and permanent nail deformity Location:    Foot:  L big toe Pre-procedure details:    Skin preparation:  Betadine   Preparation: Patient was prepped and draped in the usual sterile fashion   Anesthesia (see MAR for exact dosages):    Anesthesia method:  Nerve block   Block location:  Left great toe   Block needle gauge:  25 G   Block anesthetic:  Lidocaine 2% w/o epi   Block injection procedure:  Anatomic landmarks palpated   Block outcome:  Anesthesia achieved Nail Removal:    Nail removed:  Complete   Nail bed repaired: no     Removed nail  replaced and anchored: yes     Stented with:  3-0 prolene Trephination:    Subungual hematoma drained: no   Ingrown nail:    Nail matrix removed or ablated:  None Post-procedure details:    Dressing:  Post-op shoe, petrolatum-impregnated gauze and 4x4 sterile gauze   Patient tolerance of procedure:  Tolerated well, no immediate complications   (including critical care time)   Medications Ordered in ED Medications  HYDROcodone-acetaminophen (NORCO/VICODIN) 5-325 MG per tablet 1 tablet (1 tablet Oral Given 12/03/16 1531)  lidocaine (XYLOCAINE) 2 % injection 10 mL (10 mLs Intradermal Given 12/03/16 1533)     Initial Impression / Assessment  and Plan / ED Course  I have reviewed the triage vital signs and the nursing notes.  Pertinent labs & imaging results that were available during my care of the patient were reviewed by me and considered in my medical decision making (see chart for details).     No bony injury, nail re-positioned and sutured to the nail bed.  Wound care instructions given.  sutures out in 10-14 days.  Podiatry referral given.    Final Clinical Impressions(s) / ED Diagnoses   Final diagnoses:  Toenail avulsion, initial encounter    New Prescriptions New Prescriptions   HYDROCODONE-ACETAMINOPHEN (NORCO/VICODIN) 5-325 MG TABLET    Take one-two tabs po q 4-6 hrs prn pain     Pauline Ausammy Xiao Graul, PA-C 12/06/16 1213    Donnetta HutchingBrian Cook, MD 12/22/16 1350

## 2016-12-03 NOTE — ED Notes (Signed)
nad noted prior to dc. Dc instructions reviewed and explained. Dad voiced understanding as well as pt. Post op shoe applied and drsg

## 2016-12-03 NOTE — Discharge Instructions (Signed)
Clean the toe with mild soap and water.  Keep it bandaged.  Sutures out in 10-14 days.  Elevate your foot when possible.

## 2016-12-03 NOTE — ED Triage Notes (Signed)
Left great toe injury from a log. Laceration noted around nail bed. Bleeding controlled at this time.

## 2016-12-07 ENCOUNTER — Emergency Department (HOSPITAL_COMMUNITY)
Admission: EM | Admit: 2016-12-07 | Discharge: 2016-12-07 | Disposition: A | Discharge status: Home / Self Care | Payer: Medicaid Other

## 2016-12-07 NOTE — ED Triage Notes (Signed)
Pt here for suture removal.  Looked up past visit and states to return 10-14 days from sutures placed on 12/03/16.  Pt denies any other problems with wound. Reprint of d/c instructions and pt received the instructions. Pt to come back during time frame of suture removal.  Pt left with father with steady gait.

## 2016-12-13 ENCOUNTER — Encounter (HOSPITAL_COMMUNITY): Payer: Self-pay | Admitting: Emergency Medicine

## 2016-12-13 ENCOUNTER — Emergency Department (HOSPITAL_COMMUNITY)
Admission: EM | Admit: 2016-12-13 | Discharge: 2016-12-13 | Disposition: A | Discharge status: Home / Self Care | Payer: Medicaid Other | Attending: Emergency Medicine | Admitting: Emergency Medicine

## 2016-12-13 DIAGNOSIS — F909 Attention-deficit hyperactivity disorder, unspecified type: Secondary | ICD-10-CM | POA: Insufficient documentation

## 2016-12-13 DIAGNOSIS — J45909 Unspecified asthma, uncomplicated: Secondary | ICD-10-CM | POA: Insufficient documentation

## 2016-12-13 DIAGNOSIS — Z4802 Encounter for removal of sutures: Secondary | ICD-10-CM | POA: Diagnosis not present

## 2016-12-13 NOTE — Discharge Instructions (Signed)
Wash your toe like you normally would with soap and water.  The nail will eventually fall away and a new one should already be starting to grow back underneath the old one.  Followup with the podiatrist you were referred to at your last visit.

## 2016-12-13 NOTE — ED Provider Notes (Signed)
[facility] DEPT Provider Note   CSN: [id number] Arrival date & time: 12/13/16  4098     History   Chief Complaint Chief Complaint  Patient presents with  . Suture / Staple Removal    HPI Eugene Hicks is a 26 y.o. male presenting for suture removal, placed 10 days ago in his left great toe after loosing the nail when a piece of wood fell on it.  He has been keeping the site clean using warm soaks, hydrogen peroxide and antibiotic ointment.  He denies drainage , swelling or worsened pain.  The history is provided by the patient.    Past Medical History:  Diagnosis Date  . ADHD (attention deficit hyperactivity disorder)   . Asthma   . Back pain   . Shoulder pain     Patient Active Problem List   Diagnosis Date Noted  . Bipolar affective (HCC) 11/21/2013  . ADHD (attention deficit hyperactivity disorder)     Past Surgical History:  Procedure Laterality Date  . UPPER GI ENDOSCOPY         Home Medications    Prior to Admission medications   Medication Sig Start Date End Date Taking? Authorizing Provider  acetaminophen (TYLENOL) 500 MG tablet Take 500 mg by mouth every 6 (six) hours as needed for mild pain or moderate pain.    Historical Provider, MD  ALPRAZolam Prudy Feeler) 1 MG tablet Take 1 tablet twice daily for 7 days, then take 1/2 tablet twice daily for 14 days, then 1/2 tablet once daily for 7 days. Patient taking differently: Take 1 mg by mouth 2 (two) times daily.  10/28/14   Burgess Amor, PA-C  clindamycin (CLEOCIN) 300 MG capsule Take 1 capsule (300 mg total) by mouth 3 (three) times daily. 07/18/16   Hope Orlene Och, NP  EPINEPHrine (EPI-PEN) 0.3 mg/0.3 mL SOAJ injection Inject 0.3 mLs (0.3 mg total) into the muscle once. 07/26/13   Donnetta Hutching, MD  fluticasone (FLONASE) 50 MCG/ACT nasal spray Place 2 sprays into the nose daily.      Historical Provider, MD  HYDROcodone-acetaminophen (NORCO/VICODIN) 5-325 MG tablet Take one-two tabs po q 4-6 hrs prn pain 12/03/16    Tammy Triplett, PA-C  lithium carbonate 300 MG capsule Take 300 mg by mouth 2 (two) times daily.     Historical Provider, MD  magic mouthwash w/lidocaine SOLN Take 5 mLs by mouth 3 (three) times daily as needed for mouth pain (swish and spit). 07/18/16   Hope Orlene Och, NP  propranolol (INNOPRAN XL) 80 MG 24 hr capsule Take 80 mg by mouth daily.     Historical Provider, MD  QUEtiapine (SEROQUEL) 300 MG tablet Take 300 mg by mouth every evening.     Historical Provider, MD  traMADol (ULTRAM) 50 MG tablet Take 1 tablet (50 mg total) by mouth every 6 (six) hours as needed. 07/14/16   Burgess Amor, PA-C    Family History Family History  Problem Relation Age of Onset  . Stroke Father   . Seizures Mother   . Seizures Brother     Social History Social History  Substance Use Topics  . Smoking status: Never Smoker  . Smokeless tobacco: Never Used  . Alcohol use No     Allergies   Codeine; Lactose intolerance (gi); Percocet [oxycodone-acetaminophen]; and Penicillins   Review of Systems Review of Systems  Constitutional: Negative for chills and fever.  Respiratory: Negative for shortness of breath and wheezing.   Skin: Positive for wound.  Neurological:  Negative for numbness.     Physical Exam Updated Vital Signs BP 107/58 (BP Location: Left Arm)   Pulse 63   Temp 97.8 F (36.6 C) (Oral)   Resp 18   Ht 5\' 8"  (1.727 m)   Wt 61.2 kg   SpO2 98%   BMI 20.53 kg/m   Physical Exam  Constitutional: He is oriented to person, place, and time. He appears well-developed and well-nourished.  HENT:  Head: Normocephalic.  Cardiovascular: Normal rate.   Pulmonary/Chest: Effort normal.  Musculoskeletal: He exhibits tenderness.  Neurological: He is alert and oriented to person, place, and time. No sensory deficit.  Skin: Laceration noted.  3 sutures in place tacking down the left great toe nail.  No edema, no drainage or red streaking.     ED Treatments / Results  Labs (all labs  ordered are listed, but only abnormal results are displayed) Labs Reviewed - No data to display  EKG  EKG Interpretation None       Radiology No results found.  Procedures .Suture Removal Date/Time: 12/13/2016 8:21 AM Performed by: Burgess AmorIDOL, Emanuell Morina Authorized by: Burgess AmorIDOL, Adaleigh Warf   Consent:    Consent obtained:  Verbal   Consent given by:  Patient   Risks discussed:  Pain Location:    Location:  Lower extremity   Lower extremity location:  Toe   Toe location:  L big toe Procedure details:    Wound appearance:  No signs of infection   Number of sutures removed:  3 Post-procedure details:    Post-removal:  Dressing applied   (including critical care time)  Medications Ordered in ED Medications - No data to display   Initial Impression / Assessment and Plan / ED Course  I have reviewed the triage vital signs and the nursing notes.  Pertinent labs & imaging results that were available during my care of the patient were reviewed by me and considered in my medical decision making (see chart for details).     Pt advised to let the nail fall off when it is ready, currently fairly well adhered.  Soap and water soak bid, f/u with podiatry as originally planned.  Final Clinical Impressions(s) / ED Diagnoses   Final diagnoses:  Visit for suture removal    New Prescriptions New Prescriptions   No medications on file     Burgess AmorJulie Shabree Tebbetts, PA-C 12/13/16 [id number]    Bethann BerkshireJoseph Zammit, MD 12/14/16 [phone]

## 2016-12-13 NOTE — ED Triage Notes (Signed)
Patient here to have suture removed from left great toe. Sutures x3 in x14 days. No signs or symptoms of infection.

## 2017-04-01 ENCOUNTER — Emergency Department (HOSPITAL_COMMUNITY)
Admission: EM | Admit: 2017-04-01 | Discharge: 2017-04-02 | Disposition: A | Discharge status: Home / Self Care | Payer: Medicaid Other | Attending: Emergency Medicine | Admitting: Emergency Medicine

## 2017-04-01 ENCOUNTER — Encounter (HOSPITAL_COMMUNITY): Payer: Self-pay | Admitting: Emergency Medicine

## 2017-04-01 DIAGNOSIS — S50861A Insect bite (nonvenomous) of right forearm, initial encounter: Secondary | ICD-10-CM | POA: Insufficient documentation

## 2017-04-01 DIAGNOSIS — F909 Attention-deficit hyperactivity disorder, unspecified type: Secondary | ICD-10-CM | POA: Diagnosis not present

## 2017-04-01 DIAGNOSIS — Y9389 Activity, other specified: Secondary | ICD-10-CM | POA: Diagnosis not present

## 2017-04-01 DIAGNOSIS — Z79899 Other long term (current) drug therapy: Secondary | ICD-10-CM | POA: Diagnosis not present

## 2017-04-01 DIAGNOSIS — J45909 Unspecified asthma, uncomplicated: Secondary | ICD-10-CM | POA: Insufficient documentation

## 2017-04-01 DIAGNOSIS — Y929 Unspecified place or not applicable: Secondary | ICD-10-CM | POA: Insufficient documentation

## 2017-04-01 DIAGNOSIS — W57XXXA Bitten or stung by nonvenomous insect and other nonvenomous arthropods, initial encounter: Secondary | ICD-10-CM | POA: Insufficient documentation

## 2017-04-01 DIAGNOSIS — Y998 Other external cause status: Secondary | ICD-10-CM | POA: Insufficient documentation

## 2017-04-01 MED ORDER — DIPHENHYDRAMINE HCL 25 MG PO CAPS
25.0000 mg | ORAL_CAPSULE | Freq: Once | ORAL | Status: AC
  Administered 2017-04-01: 25 mg via ORAL
  Filled 2017-04-01: qty 1

## 2017-04-01 NOTE — ED Triage Notes (Signed)
Family member states "He got bit by a spider a couple of hours ago and now it's swelling"  Pt states he did not see the spider.  Family member states "he has been killing a lot of them lately"  Slight redness to inside of right forearm

## 2017-04-02 NOTE — ED Notes (Signed)
Pt and father states understanding of care given and follow up instructions.  Pt a/o ambulated from ED with steady gait

## 2017-04-02 NOTE — ED Provider Notes (Signed)
[facility] Provider Note   CSN: [id number] Arrival date & time: 04/01/17  2228     History   Chief Complaint Chief Complaint  Patient presents with  . Insect Bite    HPI Eugene Hicks is a 26 y.o. male.  HPI   Eugene Hicks is a 26 y.o. male who presents to the Emergency Department complaining of possible sider or insect bite.  States that he felt a sharp, stinging pain to his right forearm approximately two hours prior to arrival.  Pain associated with redness, swelling and itching to the area with tingling to his index and middle fingers.  Father of the patient is concerned that he may have been bitten by a spider.  Bite was not witnessed.  Father applied rubbing alcohol w/o relief.  Patient denies nausea, abd pain or cramping, discoloration of the fingers, fever, and chills.     Past Medical History:  Diagnosis Date  . ADHD (attention deficit hyperactivity disorder)   . Asthma   . Back pain   . Shoulder pain     Patient Active Problem List   Diagnosis Date Noted  . Bipolar affective (HCC) 11/21/2013  . ADHD (attention deficit hyperactivity disorder)     Past Surgical History:  Procedure Laterality Date  . UPPER GI ENDOSCOPY         Home Medications    Prior to Admission medications   Medication Sig Start Date End Date Taking? Authorizing Provider  alprazolam Prudy Feeler(XANAX) 2 MG tablet Take 1-2 mg by mouth See admin instructions. Take 2mg  twice daily and 1mg  at bedtime   Yes [provider]  lithium carbonate 300 MG capsule Take 300 mg by mouth 2 (two) times daily.    Yes [provider]  propranolol (INNOPRAN XL) 80 MG 24 hr capsule Take 80 mg by mouth daily.    Yes [provider]  QUEtiapine (SEROQUEL) 300 MG tablet Take 300 mg by mouth every evening.    Yes [provider]  EPINEPHrine (EPI-PEN) 0.3 mg/0.3 mL SOAJ injection Inject 0.3 mLs (0.3 mg total) into the muscle once. 07/26/13   Donnetta Hutchingook, Brian, MD    Family  History Family History  Problem Relation Age of Onset  . Stroke Father   . Seizures Mother   . Seizures Brother     Social History Social History  Substance Use Topics  . Smoking status: Never Smoker  . Smokeless tobacco: Never Used  . Alcohol use No     Allergies   Codeine; Lactose intolerance (gi); Percocet [oxycodone-acetaminophen]; and Penicillins   Review of Systems Review of Systems  Constitutional: Negative for activity change, appetite change, chills and fever.  HENT: Negative for facial swelling, sore throat and trouble swallowing.   Respiratory: Negative for chest tightness, shortness of breath and wheezing.   Cardiovascular: Negative for chest pain.  Gastrointestinal: Negative for abdominal pain, nausea and vomiting.  Musculoskeletal: Negative for arthralgias, neck pain and neck stiffness.  Skin: Positive for color change (insect bite/sting to right forearm). Negative for rash and wound.  Neurological: Negative for dizziness, weakness and headaches.       "tingling" of the right index and middle fingers.   All other systems reviewed and are negative.    Physical Exam Updated Vital Signs BP 121/66 (BP Location: Left Arm)   Pulse (!) 42   Temp 97.8 F (36.6 C) (Oral)   Resp 18   Ht 5\' 8"  (1.727 m)   Wt 62.6 kg (138  lb)   SpO2 100%   BMI 20.98 kg/m   Physical Exam  Constitutional: He is oriented to person, place, and time. He appears well-developed and well-nourished. No distress.  HENT:  Head: Normocephalic and atraumatic.  Mouth/Throat: Oropharynx is clear and moist.  Neck: Normal range of motion. Neck supple.  Cardiovascular: Normal rate, regular rhythm, normal heart sounds and intact distal pulses.   No murmur heard. Pulmonary/Chest: Effort normal and breath sounds normal. No respiratory distress.  Musculoskeletal: Normal range of motion. He exhibits no edema or tenderness.  No edema of the right hand or fingers.   Lymphadenopathy:    He has no  cervical adenopathy.  Neurological: He is alert and oriented to person, place, and time. He exhibits normal muscle tone. Coordination normal.  Skin: Skin is warm. Capillary refill takes less than 2 seconds. There is erythema.  Focal area of erythema and mild edema of the mid forearm.  No puncture site.  No discoloration of the hand or fingers.   Nursing note and vitals reviewed.    ED Treatments / Results  Labs (all labs ordered are listed, but only abnormal results are displayed) Labs Reviewed - No data to display  EKG  EKG Interpretation None       Radiology No results found.  Procedures Procedures (including critical care time)  Medications Ordered in ED Medications  diphenhydrAMINE (BENADRYL) capsule 25 mg (25 mg Oral Given 04/01/17 2321)     Initial Impression / Assessment and Plan / ED Course  I have reviewed the triage vital signs and the nursing notes.  Pertinent labs & imaging results that were available during my care of the patient were reviewed by me and considered in my medical decision making (see chart for details).     On recheck, redness and swelling improved.  Numbness and tingling of the fingers have resolved.  Likely localized reaction.  Agrees to topical hydrocortisone cream and oral benadryl.  Doubt infection.   Discussed need for ER return if worsening sx's develop  Final Clinical Impressions(s) / ED Diagnoses   Final diagnoses:  Insect bite, initial encounter    New Prescriptions New Prescriptions   No medications on file     [name], Cordelia Poche 04/02/17 0042    Glynn Octave, MD 04/02/17 0236

## 2017-04-02 NOTE — Discharge Instructions (Signed)
Apply ice packs on/off.  Continue taking benadryl one capsule every 4-6 hrs for 2-3 days.  Apply hydrocortisone cream to the area 3 times a day as needed.  Return here for any worsening symptoms

## 2017-06-05 ENCOUNTER — Emergency Department (HOSPITAL_COMMUNITY)
Admission: EM | Admit: 2017-06-05 | Discharge: 2017-06-05 | Discharge status: Against Medical Advice | Payer: Medicaid Other

## 2017-06-05 NOTE — ED Notes (Signed)
Called no answer

## 2017-06-05 NOTE — ED Triage Notes (Signed)
Pt family member reports pt dropped a started onto his R hand/fingers  Dr Omelia Blackwater is PCP

## 2017-06-05 NOTE — ED Triage Notes (Signed)
Called pt no answer °

## 2017-08-01 ENCOUNTER — Emergency Department (HOSPITAL_COMMUNITY)
Admission: EM | Admit: 2017-08-01 | Discharge: 2017-08-01 | Disposition: A | Discharge status: Home / Self Care | Payer: Medicaid Other | Attending: Emergency Medicine | Admitting: Emergency Medicine

## 2017-08-01 ENCOUNTER — Encounter (HOSPITAL_COMMUNITY): Payer: Self-pay | Admitting: Emergency Medicine

## 2017-08-01 DIAGNOSIS — S61309A Unspecified open wound of unspecified finger with damage to nail, initial encounter: Secondary | ICD-10-CM | POA: Diagnosis not present

## 2017-08-01 DIAGNOSIS — Y939 Activity, unspecified: Secondary | ICD-10-CM | POA: Insufficient documentation

## 2017-08-01 DIAGNOSIS — Z79899 Other long term (current) drug therapy: Secondary | ICD-10-CM | POA: Insufficient documentation

## 2017-08-01 DIAGNOSIS — Y929 Unspecified place or not applicable: Secondary | ICD-10-CM | POA: Insufficient documentation

## 2017-08-01 DIAGNOSIS — S6981XA Other specified injuries of right wrist, hand and finger(s), initial encounter: Secondary | ICD-10-CM | POA: Diagnosis present

## 2017-08-01 DIAGNOSIS — Y999 Unspecified external cause status: Secondary | ICD-10-CM | POA: Diagnosis not present

## 2017-08-01 DIAGNOSIS — W230XXA Caught, crushed, jammed, or pinched between moving objects, initial encounter: Secondary | ICD-10-CM | POA: Diagnosis not present

## 2017-08-01 DIAGNOSIS — J45909 Unspecified asthma, uncomplicated: Secondary | ICD-10-CM | POA: Insufficient documentation

## 2017-08-01 DIAGNOSIS — F909 Attention-deficit hyperactivity disorder, unspecified type: Secondary | ICD-10-CM | POA: Insufficient documentation

## 2017-08-01 MED ORDER — LIDOCAINE HCL (PF) 2 % IJ SOLN
INTRAMUSCULAR | Status: AC
  Administered 2017-08-01: 5 mL
  Filled 2017-08-01: qty 20

## 2017-08-01 MED ORDER — LIDOCAINE HCL (PF) 2 % IJ SOLN: 10.0000 mL | Freq: Once | INTRAMUSCULAR | Status: DC

## 2017-08-01 NOTE — ED Provider Notes (Signed)
[facility] Provider Note   CSN: [id number] Arrival date & time: 08/01/17  1837     History   Chief Complaint Chief Complaint  Patient presents with  . Finger Injury    HPI [name] is a 26 y.o. male presenting with complaint of a loose fingernail which is bothering him and would like it removed.  He hit the finger about a month ago between 2 pieces of wood, the nail turned black beneath it and now the nail is falling off.  He denies pain in the finger at this time.  He was not seen previously for this injury.  HPI  Past Medical History:  Diagnosis Date  . ADHD (attention deficit hyperactivity disorder)   . Asthma   . Back pain   . Shoulder pain     Patient Active Problem List   Diagnosis Date Noted  . Bipolar affective (HCC) 11/21/2013  . ADHD (attention deficit hyperactivity disorder)     Past Surgical History:  Procedure Laterality Date  . UPPER GI ENDOSCOPY         Home Medications    Prior to Admission medications   Medication Sig Start Date End Date Taking? Authorizing Provider  alprazolam Prudy Feeler) 2 MG tablet Take 1-2 mg by mouth See admin instructions. Take  twice daily and  at bedtime   Yes [provider]  EPINEPHrine (EPI-PEN) 0.3 mg/0.3 mL SOAJ injection Inject 0.3 mLs (0.3 mg total) into the muscle once. 07/26/13  Yes Donnetta Hutching, MD  lithium carbonate 300 MG capsule Take 300 mg by mouth 2 (two) times daily.    Yes [provider]  propranolol (INNOPRAN XL) 80 MG 24 hr capsule Take 80 mg by mouth daily.    Yes [provider]  QUEtiapine (SEROQUEL) 300 MG tablet Take 300 mg by mouth every evening.    Yes [provider]    Family History Family History  Problem Relation Age of Onset  . Stroke Father   . Seizures Mother   . Seizures Brother     Social History Social History  Substance Use Topics  . Smoking status: Never Smoker  . Smokeless tobacco: Never Used  . Alcohol use No      Allergies   Codeine; Lactose intolerance (gi); Percocet [oxycodone-acetaminophen]; and Penicillins   Review of Systems Review of Systems  Constitutional: Negative for fever.  Musculoskeletal: Negative for arthralgias, joint swelling and myalgias.  Skin:       Negative except as mentioned in HPI.   Neurological: Negative for weakness and numbness.     Physical Exam Updated Vital Signs BP 112/73 (BP Location: Left Arm)   Pulse 61   Temp 98 F (36.7 C) (Oral)   Resp 18   Ht  (1.727 m)   Wt 63.5 kg (140 lb)   SpO2 100%   BMI 21.29 kg/m   Physical Exam  Constitutional: He appears well-developed and well-nourished.  HENT:  Head: Atraumatic.  Neck: Normal range of motion.  Cardiovascular:  Pulses equal bilaterally  Musculoskeletal: He exhibits no tenderness or deformity.  Right long fingernail is loose and separated from the nail bed along the proximal and lateral edges, still attached distally.  Obvious old coagulated subungual hematoma. Distal sensation intact.  Less than 2 sec cap refill in fingertip.  Pt has FROM of finger without deficit.  Neurological: He is alert. He has normal strength. He displays normal reflexes. No sensory deficit.  Skin: Skin is warm and dry.  Psychiatric: He has a normal mood and affect.     ED Treatments / Results  Labs (all labs ordered are listed, but only abnormal results are displayed) Labs Reviewed - No data to display  EKG  EKG Interpretation None       Radiology No results found.  Procedures .Nail Removal Date/Time: 08/01/2017 8:15 PM Performed by: Burgess Amor Authorized by: Burgess Amor   Consent:    Consent obtained:  Verbal   Consent given by:  Patient   Risks discussed:  Bleeding, incomplete removal and pain   Alternatives discussed:  No treatment and delayed treatment Location:    Hand:  R long finger Pre-procedure details:    Skin preparation:  Betadine   Preparation: Patient was prepped and  draped in the usual sterile fashion   Anesthesia (see MAR for exact dosages):    Anesthesia method:  Nerve block (digital block)   Block needle gauge:  25 G   Block anesthetic:  Lidocaine 2% w/o epi   Block technique:  Digital   Block injection procedure:  Anatomic landmarks identified   Block outcome:  Anesthesia achieved Nail Removal:    Nail removed:  Complete   Nail bed repaired: no     Stented with:  No repair required for this old injury.  No nailbed laceration. Ingrown nail:    Nail matrix removed or ablated:  None Post-procedure details:    Dressing:  4x4 sterile gauze   Patient tolerance of procedure:  Tolerated well, no immediate complications   (including critical care time)  Medications Ordered in ED Medications  lidocaine (XYLOCAINE) 2 % injection 10 mL (not administered)  lidocaine (XYLOCAINE) 2 % injection (5 mLs  Given by Other 08/01/17 2020)     Initial Impression / Assessment and Plan / ED Course  I have reviewed the triage vital signs and the nursing notes.  Pertinent labs & imaging results that were available during my care of the patient were reviewed by me and considered in my medical decision making (see chart for details).     Discussed with patient that he may or may not have deformity as new nail grows in.  There does appear to be an early nail started at the proximal fold.  Final Clinical Impressions(s) / ED Diagnoses   Final diagnoses:  Traumatic avulsion of nail plate of finger, initial encounter    New Prescriptions New Prescriptions   No medications on file     Victoriano Lain 08/01/17 2117    Bethann Berkshire, MD 08/01/17 2128

## 2017-08-01 NOTE — ED Triage Notes (Signed)
Pt reports smashing left middle finger about a month ago and nail turned black.  Is now detached on one side.  Tried to pull it off today and could not because it is attached.

## 2017-08-27 ENCOUNTER — Other Ambulatory Visit: Payer: Self-pay

## 2017-08-27 ENCOUNTER — Emergency Department (HOSPITAL_COMMUNITY)
Admission: EM | Admit: 2017-08-27 | Discharge: 2017-08-27 | Disposition: A | Discharge status: Home / Self Care | Payer: Medicaid Other | Attending: Emergency Medicine | Admitting: Emergency Medicine

## 2017-08-27 ENCOUNTER — Encounter (HOSPITAL_COMMUNITY): Payer: Self-pay | Admitting: *Deleted

## 2017-08-27 DIAGNOSIS — J40 Bronchitis, not specified as acute or chronic: Secondary | ICD-10-CM | POA: Diagnosis not present

## 2017-08-27 DIAGNOSIS — R05 Cough: Secondary | ICD-10-CM | POA: Diagnosis present

## 2017-08-27 DIAGNOSIS — Z79899 Other long term (current) drug therapy: Secondary | ICD-10-CM | POA: Diagnosis not present

## 2017-08-27 MED ORDER — DOXYCYCLINE HYCLATE 100 MG PO CAPS: 100.0000 mg | ORAL_CAPSULE | Freq: Two times a day (BID) | ORAL | 0 refills | Status: DC

## 2017-08-27 MED ORDER — DOXYCYCLINE HYCLATE 100 MG PO TABS
ORAL_TABLET | ORAL | Status: AC
  Filled 2017-08-27: qty 1

## 2017-08-27 MED ORDER — DOXYCYCLINE HYCLATE 100 MG PO TABS
100.0000 mg | ORAL_TABLET | Freq: Once | ORAL | Status: AC
  Administered 2017-08-27: 100 mg via ORAL

## 2017-08-27 NOTE — ED Triage Notes (Signed)
Pt has had a productive cough and runny nose x 2 days.

## 2017-08-27 NOTE — ED Provider Notes (Signed)
[facility] Provider Note   CSN: [id number] Arrival date & time: 08/27/17  1904     History   Chief Complaint Chief Complaint  Patient presents with  . Cough    HPI Doristine CounterSamuel B Feeny is a 26 y.o. male.  The history is provided by the patient. No language interpreter was used.  Cough  This is a new problem. The problem occurs constantly. The cough is productive of sputum. Associated symptoms include rhinorrhea. Pertinent negatives include no chest pain. He has tried decongestants for the symptoms. The treatment provided no relief. He is not a smoker. His past medical history does not include bronchitis.   Pt here with father.  Father has same.  Past Medical History:  Diagnosis Date  . ADHD (attention deficit hyperactivity disorder)   . Asthma   . Back pain   . Shoulder pain     Patient Active Problem List   Diagnosis Date Noted  . Bipolar affective (HCC) 11/21/2013  . ADHD (attention deficit hyperactivity disorder)     Past Surgical History:  Procedure Laterality Date  . UPPER GI ENDOSCOPY         Home Medications    Prior to Admission medications   Medication Sig Start Date End Date Taking? Authorizing Provider  alprazolam Prudy Feeler(XANAX) 2 MG tablet Take 1-2 mg by mouth See admin instructions. Take 2mg  twice daily and 1mg  at bedtime    [provider]  doxycycline (VIBRAMYCIN) 100 MG capsule Take 1 capsule (100 mg total) 2 (two) times daily by mouth. 08/27/17   Elson AreasSofia, Kawthar Ennen K, PA-C  EPINEPHrine (EPI-PEN) 0.3 mg/0.3 mL SOAJ injection Inject 0.3 mLs (0.3 mg total) into the muscle once. 07/26/13   Donnetta Hutchingook, Brian, MD  lithium carbonate 300 MG capsule Take 300 mg by mouth 2 (two) times daily.     [provider]  propranolol (INNOPRAN XL) 80 MG 24 hr capsule Take 80 mg by mouth daily.     [provider]  QUEtiapine (SEROQUEL) 300 MG tablet Take 300 mg by mouth every evening.     [provider]    Family  History Family History  Problem Relation Age of Onset  . Stroke Father   . Seizures Mother   . Seizures Brother     Social History Social History   Tobacco Use  . Smoking status: Never Smoker  . Smokeless tobacco: Never Used  Substance Use Topics  . Alcohol use: No  . Drug use: No     Allergies   Codeine; Lactose intolerance (gi); Percocet [oxycodone-acetaminophen]; and Penicillins   Review of Systems Review of Systems  HENT: Positive for rhinorrhea.   Respiratory: Positive for cough.   Cardiovascular: Negative for chest pain.  All other systems reviewed and are negative.    Physical Exam Updated Vital Signs BP 122/81 (BP Location: Right Arm)   Pulse 78   Temp 98.5 F (36.9 C) (Oral)   Resp 18   Ht 5\' 8"  (1.727 m)   Wt 63.5 kg (140 lb)   SpO2 98%   BMI 21.29 kg/m   Physical Exam  Constitutional: He appears well-developed and well-nourished.  HENT:  Head: Normocephalic and atraumatic.  Eyes: Conjunctivae are normal.  Neck: Neck supple.  Cardiovascular: Normal rate and regular rhythm.  No murmur heard. Pulmonary/Chest: Effort normal and breath sounds normal. No respiratory distress.  Abdominal: Soft. There is no tenderness.  Musculoskeletal: He exhibits no edema.  Neurological: He is alert.  Skin: Skin is  warm and dry.  Psychiatric: He has a normal mood and affect.  Nursing note and vitals reviewed.    ED Treatments / Results  Labs (all labs ordered are listed, but only abnormal results are displayed) Labs Reviewed - No data to display  EKG  EKG Interpretation None       Radiology No results found.  Procedures Procedures (including critical care time)  Medications Ordered in ED Medications - No data to display   Initial Impression / Assessment and Plan / ED Course  I have reviewed the triage vital signs and the nursing notes.  Pertinent labs & imaging results that were available during my care of the patient were reviewed by me  and considered in my medical decision making (see chart for details).     Probable viral illness.  Pt and family requesting antibiotics.    Final Clinical Impressions(s) / ED Diagnoses   Final diagnoses:  Bronchitis    ED Discharge Orders        Ordered    doxycycline (VIBRAMYCIN) 100 MG capsule  2 times daily     08/27/17 2001    An After Visit Summary was printed and given to the patient.    Osie CheeksSofia, Allysia Ingles K, PA-C 08/27/17 2008    Raeford RazorKohut, Stephen, MD 08/31/17 1236

## 2018-04-16 ENCOUNTER — Encounter (HOSPITAL_COMMUNITY): Payer: Self-pay | Admitting: Emergency Medicine

## 2018-04-16 ENCOUNTER — Other Ambulatory Visit: Payer: Self-pay

## 2018-04-16 ENCOUNTER — Emergency Department (HOSPITAL_COMMUNITY)
Admission: EM | Admit: 2018-04-16 | Discharge: 2018-04-16 | Disposition: A | Discharge status: Home / Self Care | Payer: Medicaid Other | Attending: Emergency Medicine | Admitting: Emergency Medicine

## 2018-04-16 DIAGNOSIS — Z79899 Other long term (current) drug therapy: Secondary | ICD-10-CM | POA: Diagnosis not present

## 2018-04-16 DIAGNOSIS — J45909 Unspecified asthma, uncomplicated: Secondary | ICD-10-CM | POA: Diagnosis not present

## 2018-04-16 DIAGNOSIS — H6121 Impacted cerumen, right ear: Secondary | ICD-10-CM | POA: Insufficient documentation

## 2018-04-16 MED ORDER — HYDROGEN PEROXIDE 3 % EX SOLN
CUTANEOUS | Status: AC
  Filled 2018-04-16: qty 473

## 2018-04-16 MED ORDER — NEOMYCIN-POLYMYXIN-HC 1 % OT SOLN
3.0000 [drp] | Freq: Once | OTIC | Status: AC
  Administered 2018-04-16: 3 [drp] via OTIC
  Filled 2018-04-16: qty 10

## 2018-04-16 NOTE — ED Triage Notes (Signed)
Pt states he has had a wax impaction to R ear for three days.

## 2018-04-16 NOTE — ED Notes (Signed)
Education: Care sheet provided with  Different care options listed for pt family for more cost effective care options

## 2018-04-16 NOTE — Discharge Instructions (Addendum)
Please do not use Q-tips.  Please use three Cortisporin eardrops in the right ear 3 times daily over the next 4 or 5 days.  Please see Dr. Omelia BlackwaterHeaden for additional evaluation and management if not improving.

## 2018-04-16 NOTE — ED Provider Notes (Signed)
[facility] EMERGENCY DEPARTMENT Provider Note   CSN: [id number] Arrival date & time: 04/16/18  1449     History   Chief Complaint Chief Complaint  Patient presents with  . Cerumen Impaction    HPI Eugene Hicks is a 27 y.o. male.  HPI  Past Medical History:  Diagnosis Date  . ADHD (attention deficit hyperactivity disorder)   . Asthma   . Back pain   . Shoulder pain     Patient Active Problem List   Diagnosis Date Noted  . Bipolar affective (HCC) 11/21/2013  . ADHD (attention deficit hyperactivity disorder)     Past Surgical History:  Procedure Laterality Date  . UPPER GI ENDOSCOPY          Home Medications    Prior to Admission medications   Medication Sig Start Date End Date Taking? Authorizing Provider  alprazolam Prudy Feeler(XANAX) 2 MG tablet Take 1-2 mg by mouth See admin instructions. Take 2mg  twice daily and 1mg  at bedtime    [provider]  doxycycline (VIBRAMYCIN) 100 MG capsule Take 1 capsule (100 mg total) 2 (two) times daily by mouth. 08/27/17   Elson AreasSofia, Leslie K, PA-C  EPINEPHrine (EPI-PEN) 0.3 mg/0.3 mL SOAJ injection Inject 0.3 mLs (0.3 mg total) into the muscle once. 07/26/13   Donnetta Hutchingook, Brian, MD  lithium carbonate 300 MG capsule Take 300 mg by mouth 2 (two) times daily.     [provider]  propranolol (INNOPRAN XL) 80 MG 24 hr capsule Take 80 mg by mouth daily.     [provider]  QUEtiapine (SEROQUEL) 300 MG tablet Take 300 mg by mouth every evening.     [provider]    Family History Family History  Problem Relation Age of Onset  . Stroke Father   . Seizures Mother   . Seizures Brother     Social History Social History   Tobacco Use  . Smoking status: Never Smoker  . Smokeless tobacco: Never Used  Substance Use Topics  . Alcohol use: No  . Drug use: No     Allergies   Codeine; Lactose intolerance (gi); Percocet [oxycodone-acetaminophen]; and Penicillins   Review of Systems Review of  Systems   Physical Exam Updated Vital Signs BP 123/70 (BP Location: Right Arm)   Pulse (!) 59   Temp 98.2 F (36.8 C) (Temporal)   Resp 14   Ht 5\' 9"  (1.753 m)   Wt 62.6 kg (138 lb)   SpO2 99%   BMI 20.38 kg/m   Physical Exam  Constitutional: He is oriented to person, place, and time. He appears well-developed and well-nourished.  Non-toxic appearance.  HENT:  Head: Normocephalic.  Right Ear: External ear normal.  Left Ear: Tympanic membrane, external ear and ear canal normal.  There is a cerumen impaction in the right external auditory canal.  The tympanic membrane cannot be visualized.  Eyes: Pupils are equal, round, and reactive to light. EOM and lids are normal.  Neck: Normal range of motion. Neck supple. Carotid bruit is not present.  Cardiovascular: Normal rate, regular rhythm, normal heart sounds, intact distal pulses and normal pulses.  Pulmonary/Chest: Breath sounds normal. No respiratory distress.  Abdominal: Soft. Bowel sounds are normal. There is no tenderness. There is no guarding.  Musculoskeletal: Normal range of motion.  Lymphadenopathy:       Head (right side): No submandibular adenopathy present.       Head (left side): No submandibular adenopathy present.    He has  no cervical adenopathy.  Neurological: He is alert and oriented to person, place, and time. He has normal strength. No cranial nerve deficit or sensory deficit.  Skin: Skin is warm and dry.  Psychiatric: He has a normal mood and affect. His speech is normal.  Nursing note and vitals reviewed.    ED Treatments / Results  Labs (all labs ordered are listed, but only abnormal results are displayed) Labs Reviewed - No data to display  EKG None  Radiology No results found.  Procedures .Ear Cerumen Removal Date/Time: 04/16/2018 3:41 PM Performed by: Ivery Quale, PA-C Authorized by: Ivery Quale, PA-C   Consent:    Consent obtained:  Verbal   Consent given by:  Patient   Risks  discussed:  Incomplete removal, pain and TM perforation Procedure details:    Location:  R ear   Procedure type: irrigation   Post-procedure details:    Inspection:  TM intact   Hearing quality:  Improved   Patient tolerance of procedure:  Tolerated well, no immediate complications   (including critical care time)  Medications Ordered in ED Medications - No data to display   Initial Impression / Assessment and Plan / ED Course  I have reviewed the triage vital signs and the nursing notes.  Pertinent labs & imaging results that were available during my care of the patient were reviewed by me and considered in my medical decision making (see chart for details).       Final Clinical Impressions(s) / ED Diagnoses MDM  Patient came to the emergency department with his father who is the legal guardian.  They state that the patient is having some pain in his ear and difficulty with hearing.  They state the patient has been using Q-tips recently.  Patient has a earwax impaction.  The impaction was removed by irrigation.  The patient states he can hear much better now and he feels better.  Patient is to follow-up with his primary physician Dr. Curly Shores if any changes or problems.   Final diagnoses:  Impacted cerumen of right ear    ED Discharge Orders    None       Ivery Quale, PA-C 04/16/18 1543    Benjiman Core, MD 04/16/18 [phone]

## 2018-04-16 NOTE — ED Notes (Addendum)
Complains of ear wax pain x 4 days No call to PCP nor attempt to self care  Here because per family member " it is worse today. I have so many things going on I cannot tell you"  Active listening   Informed PA will assess

## 2018-08-21 ENCOUNTER — Emergency Department (HOSPITAL_COMMUNITY)
Admission: EM | Admit: 2018-08-21 | Discharge: 2018-08-21 | Disposition: A | Discharge status: Home / Self Care | Payer: Medicaid Other | Attending: Emergency Medicine | Admitting: Emergency Medicine

## 2018-08-21 ENCOUNTER — Encounter (HOSPITAL_COMMUNITY): Payer: Self-pay | Admitting: Emergency Medicine

## 2018-08-21 ENCOUNTER — Other Ambulatory Visit: Payer: Self-pay

## 2018-08-21 DIAGNOSIS — J45909 Unspecified asthma, uncomplicated: Secondary | ICD-10-CM | POA: Insufficient documentation

## 2018-08-21 DIAGNOSIS — Z79899 Other long term (current) drug therapy: Secondary | ICD-10-CM | POA: Insufficient documentation

## 2018-08-21 DIAGNOSIS — H9201 Otalgia, right ear: Secondary | ICD-10-CM | POA: Insufficient documentation

## 2018-08-21 MED ORDER — ANTIPYRINE-BENZOCAINE 5.4-1.4 % OT SOLN: 3.0000 [drp] | OTIC | 0 refills | Status: DC | PRN

## 2018-08-21 MED ORDER — AZITHROMYCIN 250 MG PO TABS
500.0000 mg | ORAL_TABLET | Freq: Once | ORAL | Status: AC
  Administered 2018-08-21: 500 mg via ORAL
  Filled 2018-08-21: qty 2

## 2018-08-21 MED ORDER — AZITHROMYCIN 250 MG PO TABS: ORAL_TABLET | ORAL | 0 refills | Status: DC

## 2018-08-21 MED ORDER — ACETAMINOPHEN 500 MG PO TABS
1000.0000 mg | ORAL_TABLET | Freq: Once | ORAL | Status: AC
  Administered 2018-08-21: 1000 mg via ORAL
  Filled 2018-08-21: qty 2

## 2018-08-21 NOTE — Discharge Instructions (Addendum)
Take the medication as directed.  Follow-up if needed

## 2018-08-21 NOTE — ED Provider Notes (Signed)
[facility] EMERGENCY DEPARTMENT Provider Note   CSN: [id number] Arrival date & time: 08/21/18  1604     History   Chief Complaint Chief Complaint  Patient presents with  . Otalgia    HPI Eugene Hicks is a 27 y.o. male.  HPI   Eugene Hicks is a 27 y.o. male who presents to the Emergency Department complaining of pain to his right ear for 1 day.  He states the pain began after using Q-tips to clean his ear.  He is concerned that he may have part of the Q-tip stuck in his ear.  He denies fever, congestion, cough, headache, dizziness, and decreased hearing.  Nothing makes his symptoms better or worse.    Past Medical History:  Diagnosis Date  . ADHD (attention deficit hyperactivity disorder)   . Asthma   . Back pain   . Shoulder pain     Patient Active Problem List   Diagnosis Date Noted  . Bipolar affective (HCC) 11/21/2013  . ADHD (attention deficit hyperactivity disorder)     Past Surgical History:  Procedure Laterality Date  . UPPER GI ENDOSCOPY          Home Medications    Prior to Admission medications   Medication Sig Start Date End Date Taking? Authorizing Provider  alprazolam Prudy Feeler) 2 MG tablet Take 1-2 mg by mouth See admin instructions. Take 2mg  twice daily and 1mg  at bedtime    [provider]  antipyrine-benzocaine Lyla Son) OTIC solution Place 3-4 drops into the right ear every 2 (two) hours as needed for ear pain. 08/21/18   Issaih Kaus, PA-C  azithromycin (ZITHROMAX) 250 MG tablet One tablet daily until finished 08/21/18   Iyana Topor, PA-C  doxycycline (VIBRAMYCIN) 100 MG capsule Take 1 capsule (100 mg total) 2 (two) times daily by mouth. 08/27/17   Elson Areas, PA-C  EPINEPHrine (EPI-PEN) 0.3 mg/0.3 mL SOAJ injection Inject 0.3 mLs (0.3 mg total) into the muscle once. 07/26/13   Donnetta Hutching, MD  lithium carbonate 300 MG capsule Take 300 mg by mouth 2 (two) times daily.     [provider]  propranolol  (INNOPRAN XL) 80 MG 24 hr capsule Take 80 mg by mouth daily.     [provider]  QUEtiapine (SEROQUEL) 300 MG tablet Take 300 mg by mouth every evening.     [provider]    Family History Family History  Problem Relation Age of Onset  . Stroke Father   . Seizures Mother   . Seizures Brother     Social History Social History   Tobacco Use  . Smoking status: Never Smoker  . Smokeless tobacco: Never Used  Substance Use Topics  . Alcohol use: No  . Drug use: No     Allergies   Codeine; Lactose intolerance (gi); Percocet [oxycodone-acetaminophen]; and Penicillins   Review of Systems Review of Systems  Constitutional: Negative for activity change, appetite change, chills and fever.  HENT: Positive for ear pain. Negative for congestion, ear discharge, facial swelling, rhinorrhea, sore throat and trouble swallowing.   Eyes: Negative for visual disturbance.  Respiratory: Negative for cough and shortness of breath.   Gastrointestinal: Negative for nausea and vomiting.  Musculoskeletal: Negative for neck pain and neck stiffness.  Skin: Negative for rash.  Neurological: Negative for dizziness, weakness, numbness and headaches.  Hematological: Negative for adenopathy.  Psychiatric/Behavioral: Negative for confusion.     Physical Exam Updated Vital Signs BP 118/69 (BP Location: Right Arm)  Pulse 62   Temp 97.7 F (36.5 C) (Oral)   Resp 18   Ht 5\' 9"  (1.753 m)   Wt 62.6 kg   SpO2 95%   BMI 20.38 kg/m   Physical Exam  Constitutional: He is oriented to person, place, and time. He appears well-developed and well-nourished. No distress.  HENT:  Head: Normocephalic and atraumatic.  Mouth/Throat: Uvula is midline, oropharynx is clear and moist and mucous membranes are normal. No uvula swelling. No oropharyngeal exudate.  Mild erythema of the right TM.  And abrasion of the canal.  No TM perforation or edema.  No FB's  Neck: Normal range of motion. Neck  supple.  Cardiovascular: Normal rate and regular rhythm.  Pulmonary/Chest: Effort normal and breath sounds normal.  Musculoskeletal: Normal range of motion.  Lymphadenopathy:    He has no cervical adenopathy.  Neurological: He is alert and oriented to person, place, and time. Coordination normal.  Skin: Skin is warm and dry. No rash noted.  Psychiatric: He has a normal mood and affect.  Nursing note and vitals reviewed.    ED Treatments / Results  Labs (all labs ordered are listed, but only abnormal results are displayed) Labs Reviewed - No data to display  EKG None  Radiology No results found.  Procedures Procedures (including critical care time)  Medications Ordered in ED Medications  acetaminophen (TYLENOL) tablet 1,000 mg (1,000 mg Oral Given 08/21/18 1652)  azithromycin (ZITHROMAX) tablet 500 mg (500 mg Oral Given 08/21/18 1652)     Initial Impression / Assessment and Plan / ED Course  I have reviewed the triage vital signs and the nursing notes.  Pertinent labs & imaging results that were available during my care of the patient were reviewed by me and considered in my medical decision making (see chart for details).     Pt well appearing.  Right otalgia w/o TM perforation.  No FB's  Final Clinical Impressions(s) / ED Diagnoses   Final diagnoses:  Right ear pain    ED Discharge Orders         Ordered    antipyrine-benzocaine (AURALGAN) OTIC solution  Every 2 hours PRN     08/21/18 1646    azithromycin (ZITHROMAX) 250 MG tablet     08/21/18 1646           [name], PA-C 08/21/18 2054    Loren Racer, MD 08/24/18 1601

## 2018-08-21 NOTE — ED Triage Notes (Signed)
Pt has a q tip stuck in his left ear

## 2018-12-11 ENCOUNTER — Other Ambulatory Visit: Payer: Self-pay

## 2018-12-11 ENCOUNTER — Emergency Department (HOSPITAL_COMMUNITY)
Admission: EM | Admit: 2018-12-11 | Discharge: 2018-12-11 | Disposition: A | Discharge status: Home / Self Care | Payer: Medicaid Other | Attending: Emergency Medicine | Admitting: Emergency Medicine

## 2018-12-11 ENCOUNTER — Emergency Department (HOSPITAL_COMMUNITY): Payer: Medicaid Other

## 2018-12-11 ENCOUNTER — Encounter (HOSPITAL_COMMUNITY): Payer: Self-pay | Admitting: Emergency Medicine

## 2018-12-11 DIAGNOSIS — J101 Influenza due to other identified influenza virus with other respiratory manifestations: Secondary | ICD-10-CM | POA: Diagnosis not present

## 2018-12-11 DIAGNOSIS — R110 Nausea: Secondary | ICD-10-CM | POA: Diagnosis present

## 2018-12-11 DIAGNOSIS — J45909 Unspecified asthma, uncomplicated: Secondary | ICD-10-CM | POA: Diagnosis not present

## 2018-12-11 DIAGNOSIS — Z79899 Other long term (current) drug therapy: Secondary | ICD-10-CM | POA: Diagnosis not present

## 2018-12-11 DIAGNOSIS — E860 Dehydration: Secondary | ICD-10-CM | POA: Insufficient documentation

## 2018-12-11 LAB — COMPREHENSIVE METABOLIC PANEL
ALBUMIN: 4.4 g/dL (ref 3.5–5.0)
ALT: 25 U/L (ref 0–44)
AST: 25 U/L (ref 15–41)
Alkaline Phosphatase: 69 U/L (ref 38–126)
Anion gap: 8 (ref 5–15)
BILIRUBIN TOTAL: 0.5 mg/dL (ref 0.3–1.2)
BUN: 11 mg/dL (ref 6–20)
CO2: 27 mmol/L (ref 22–32)
Calcium: 9 mg/dL (ref 8.9–10.3)
Chloride: 99 mmol/L (ref 98–111)
Creatinine, Ser: 1.16 mg/dL (ref 0.61–1.24)
GFR calc Af Amer: >60 mL/min (ref >60)
GFR calc non Af Amer: >60 mL/min (ref >60)
GLUCOSE: 119 mg/dL — AB (ref 70–99)
POTASSIUM: 3.8 mmol/L (ref 3.5–5.1)
Sodium: 134 mmol/L — ABNORMAL LOW (ref 135–145)
TOTAL PROTEIN: 7.5 g/dL (ref 6.5–8.1)

## 2018-12-11 LAB — CBC WITH DIFFERENTIAL/PLATELET
BASOS ABS: 0 10*3/uL (ref 0.0–0.1)
Basophils Relative: 0 %
Eosinophils Absolute: 0.1 10*3/uL (ref 0.0–0.5)
Eosinophils Relative: 1 %
HCT: 39.2 % (ref 39.0–52.0)
Hemoglobin: 11.9 g/dL — ABNORMAL LOW (ref 13.0–17.0)
Lymphocytes Relative: 11 %
Lymphs Abs: 0.7 10*3/uL (ref 0.7–4.0)
MCH: 18.9 pg — ABNORMAL LOW (ref 26.0–34.0)
MCHC: 30.4 g/dL (ref 30.0–36.0)
MCV: 62.3 fL — ABNORMAL LOW (ref 80.0–100.0)
MONOS PCT: 11 %
Monocytes Absolute: 0.7 10*3/uL (ref 0.1–1.0)
NEUTROS ABS: 4.5 10*3/uL (ref 1.7–7.7)
Neutrophils Relative %: 76 %
Platelets: 164 10*3/uL (ref 150–400)
RBC: 6.29 MIL/uL — AB (ref 4.22–5.81)
RDW: 16.6 % — ABNORMAL HIGH (ref 11.5–15.5)
WBC: 5.9 10*3/uL (ref 4.0–10.5)

## 2018-12-11 LAB — INFLUENZA PANEL BY PCR (TYPE A & B)
INFLAPCR: POSITIVE — AB
Influenza B By PCR: NEGATIVE

## 2018-12-11 LAB — LIPASE, BLOOD: Lipase: 26 U/L (ref 11–51)

## 2018-12-11 MED ORDER — LOPERAMIDE HCL 2 MG PO CAPS
4.0000 mg | ORAL_CAPSULE | Freq: Once | ORAL | Status: AC
  Administered 2018-12-11: 4 mg via ORAL
  Filled 2018-12-11: qty 2

## 2018-12-11 MED ORDER — BENZONATATE 100 MG PO CAPS: 200.0000 mg | ORAL_CAPSULE | Freq: Three times a day (TID) | ORAL | 0 refills | Status: DC | PRN

## 2018-12-11 MED ORDER — BENZONATATE 100 MG PO CAPS
200.0000 mg | ORAL_CAPSULE | Freq: Once | ORAL | Status: AC
  Administered 2018-12-11: 200 mg via ORAL
  Filled 2018-12-11: qty 2

## 2018-12-11 MED ORDER — ONDANSETRON 8 MG PO TBDP
8.0000 mg | ORAL_TABLET | Freq: Once | ORAL | Status: AC
  Administered 2018-12-11: 8 mg via ORAL
  Filled 2018-12-11: qty 1

## 2018-12-11 MED ORDER — LOPERAMIDE HCL 2 MG PO CAPS: 2.0000 mg | ORAL_CAPSULE | Freq: Four times a day (QID) | ORAL | 0 refills | Status: AC | PRN

## 2018-12-11 MED ORDER — ACETAMINOPHEN 325 MG PO TABS
650.0000 mg | ORAL_TABLET | Freq: Once | ORAL | Status: AC | PRN
  Administered 2018-12-11: 650 mg via ORAL
  Filled 2018-12-11: qty 2

## 2018-12-11 MED ORDER — SODIUM CHLORIDE 0.9 % IV BOLUS
1000.0000 mL | Freq: Once | INTRAVENOUS | Status: AC
  Administered 2018-12-11: 1000 mL via INTRAVENOUS

## 2018-12-11 NOTE — ED Provider Notes (Signed)
[facility] EMERGENCY DEPARTMENT Provider Note   CSN: [id number] Arrival date & time: 12/11/18  0957    History   Chief Complaint Chief Complaint  Patient presents with  . Fever  . Nausea    HPI Eugene Hicks is a 28 y.o. male who is well-known to this department with a history of bipolar disorder, ADHD, asthma in chronic pain and anxiety presenting with a 4-day history of multiple symptoms including dry sounding cough in association with subjective fever, no chest pain or shortness of breath.  He does endorse having diarrhea, 4-5 times per day, nonbloody and nausea without emesis.  He denies abdominal pain, even prior to bowel movements, but states he has been feeling weak and lightheaded.  He has had no medical treatments for his diarrhea prior to arrival but has had Robitussin for cough relief.  He denies sore throat, nasal congestion or drainage, denies headache, neck pain or other symptoms.  Patient is currently living with a brother and father at bedside states the brother has similar symptoms.     The history is provided by the patient and a parent.    Past Medical History:  Diagnosis Date  . ADHD (attention deficit hyperactivity disorder)   . Asthma   . Back pain   . Shoulder pain     Patient Active Problem List   Diagnosis Date Noted  . Bipolar affective (HCC) 11/21/2013  . ADHD (attention deficit hyperactivity disorder)     Past Surgical History:  Procedure Laterality Date  . UPPER GI ENDOSCOPY          Home Medications    Prior to Admission medications   Medication Sig Start Date End Date Taking? Authorizing Provider  alprazolam Prudy Feeler) 2 MG tablet Take 1-2 mg by mouth See admin instructions. Take 2mg  twice daily and 1mg  at bedtime    [provider]  antipyrine-benzocaine Lyla Son) OTIC solution Place 3-4 drops into the right ear every 2 (two) hours as needed for ear pain. 08/21/18   Triplett, Tammy, PA-C  azithromycin (ZITHROMAX) 250 MG  tablet One tablet daily until finished 08/21/18   Triplett, Tammy, PA-C  benzonatate (TESSALON) 100 MG capsule Take 2 capsules (200 mg total) by mouth 3 (three) times daily as needed. 12/11/18   Burgess Amor, PA-C  doxycycline (VIBRAMYCIN) 100 MG capsule Take 1 capsule (100 mg total) 2 (two) times daily by mouth. 08/27/17   Elson Areas, PA-C  EPINEPHrine (EPI-PEN) 0.3 mg/0.3 mL SOAJ injection Inject 0.3 mLs (0.3 mg total) into the muscle once. 07/26/13   Donnetta Hutching, MD  lithium carbonate 300 MG capsule Take 300 mg by mouth 2 (two) times daily.     [provider]  loperamide (IMODIUM) 2 MG capsule Take 1 capsule (2 mg total) by mouth 4 (four) times daily as needed for diarrhea or loose stools. 12/11/18   Burgess Amor, PA-C  propranolol (INNOPRAN XL) 80 MG 24 hr capsule Take 80 mg by mouth daily.     [provider]  QUEtiapine (SEROQUEL) 300 MG tablet Take 300 mg by mouth every evening.     [provider]    Family History Family History  Problem Relation Age of Onset  . Stroke Father   . Seizures Mother   . Seizures Brother     Social History Social History   Tobacco Use  . Smoking status: Never Smoker  . Smokeless tobacco: Never Used  Substance Use Topics  . Alcohol use: No  .  Drug use: No     Allergies   Codeine; Lactose intolerance (gi); Percocet [oxycodone-acetaminophen]; and Penicillins   Review of Systems Review of Systems  Constitutional: Positive for fever.  HENT: Negative for congestion and sore throat.   Eyes: Negative.   Respiratory: Positive for cough. Negative for chest tightness and shortness of breath.   Cardiovascular: Negative for chest pain.  Gastrointestinal: Positive for diarrhea and nausea. Negative for abdominal pain and vomiting.  Genitourinary: Negative.   Musculoskeletal: Negative for arthralgias, joint swelling and neck pain.  Skin: Negative.  Negative for rash and wound.  Neurological: Negative for dizziness, weakness,  light-headedness, numbness and headaches.  Psychiatric/Behavioral: Negative.      Physical Exam Updated Vital Signs BP 125/68 (BP Location: Right Arm)   Pulse 89   Temp (!) 101.3 F (38.5 C) (Oral)   Resp (!) 22   Ht  (1.727 m)   Wt 76.2 kg   SpO2 99%   BMI 25.54 kg/m   Physical Exam Vitals signs and nursing note reviewed.  Constitutional:      Appearance: He is well-developed.  HENT:     Head: Normocephalic and atraumatic.     Nose: Nose normal.     Mouth/Throat:     Mouth: Mucous membranes are moist.  Eyes:     Conjunctiva/sclera: Conjunctivae normal.  Neck:     Musculoskeletal: Normal range of motion.  Cardiovascular:     Rate and Rhythm: Normal rate and regular rhythm.     Heart sounds: Normal heart sounds.  Pulmonary:     Effort: Pulmonary effort is normal.     Breath sounds: Normal breath sounds. No wheezing.  Abdominal:     General: Bowel sounds are normal. There is no distension.     Palpations: Abdomen is soft.     Tenderness: There is no abdominal tenderness. There is no guarding or rebound.  Musculoskeletal: Normal range of motion.  Skin:    General: Skin is warm and dry.  Neurological:     Mental Status: He is alert.      ED Treatments / Results  Labs (all labs ordered are listed, but only abnormal results are displayed) Labs Reviewed  COMPREHENSIVE METABOLIC PANEL - Abnormal; Notable for the following components:      Result Value   Sodium 134 (*)    Glucose, Bld 119 (*)    All other components within normal limits  CBC WITH DIFFERENTIAL/PLATELET - Abnormal; Notable for the following components:   RBC 6.29 (*)    Hemoglobin 11.9 (*)    MCV 62.3 (*)    MCH 18.9 (*)    RDW 16.6 (*)    All other components within normal limits  INFLUENZA PANEL BY PCR (TYPE A & B) - Abnormal; Notable for the following components:   Influenza A By PCR POSITIVE (*)    All other components within normal limits  LIPASE, BLOOD     EKG None  Radiology Dg Abd Acute W/chest  Result Date: 12/11/2018 CLINICAL DATA:  Nausea, vomiting, and diarrhea for the past 5 days. EXAM: DG ABDOMEN ACUTE W/ 1V CHEST COMPARISON:  Chest x-ray dated January 07, 2016. FINDINGS: The cardiomediastinal silhouette is normal in size. Normal pulmonary vascularity. No focal consolidation, pleural effusion, or pneumothorax. There is no evidence of dilated bowel loops or free intraperitoneal air. No radiopaque calculi or other significant radiographic abnormality is seen. No acute osseous abnormality. IMPRESSION: Negative. Electronically Signed   By: Vickki Hearing.D.  On: 12/11/2018 11:16    Procedures Procedures (including critical care time)  Medications Ordered in ED Medications  acetaminophen (TYLENOL) tablet 650 mg (650 mg Oral Given 12/11/18 1345)  ondansetron (ZOFRAN-ODT) disintegrating tablet 8 mg (8 mg Oral Given 12/11/18 1116)  sodium chloride 0.9 % bolus 1,000 mL (0 mLs Intravenous Stopped 12/11/18 1406)  benzonatate (TESSALON) capsule 200 mg (200 mg Oral Given 12/11/18 1346)  loperamide (IMODIUM) capsule 4 mg (4 mg Oral Given 12/11/18 1346)     Initial Impression / Assessment and Plan / ED Course  I have reviewed the triage vital signs and the nursing notes.  Pertinent labs & imaging results that were available during my care of the patient were reviewed by me and considered in my medical decision making (see chart for details).        Patient's labs and imaging reviewed and discussed with him and father at bedside.  He was given IV fluids.  Also treated nausea, diarrhea and given Tylenol along with Tessalon and he felt much improved at time of discharge.  He has a 4-day history of symptoms, positive for influenza today.  He is not a candidate for Tamiflu.  We discussed home treatment and return precautions.  He was prescribed Tessalon and further Imodium as needed for continuing diarrhea.  Patient was stable at time of  discharge.  Final Clinical Impressions(s) / ED Diagnoses   Final diagnoses:  Influenza A    ED Discharge Orders         Ordered    benzonatate (TESSALON) 100 MG capsule  3 times daily PRN     12/11/18 1349    loperamide (IMODIUM) 2 MG capsule  4 times daily PRN     12/11/18 1349           Burgess Amor, PA-C 12/11/18 1642    Zyen Jester, DO 12/14/18 1501

## 2018-12-11 NOTE — ED Notes (Signed)
Notified by lab there is a delay on pt's flu swab due to machine error, but no new swab needed.

## 2018-12-11 NOTE — ED Triage Notes (Signed)
Patient complains of nausea, diarrhea, and fever with some vomiting x 4 days. Patient has been able to hold fluids down.

## 2018-12-11 NOTE — Discharge Instructions (Addendum)
Rest,  Drink plenty of fluids.  Take motrin or tylenol for achiness and fever reduction.  You may take the the medicines prescribed for your cough (tessalon) and if your diarrhea persists (imodium).  Get rechecked for increased shortness of breath, persistent or worsening symptoms. The flu generally will last for about 7 days.

## 2019-02-21 ENCOUNTER — Other Ambulatory Visit: Payer: Self-pay

## 2019-02-21 ENCOUNTER — Emergency Department (HOSPITAL_COMMUNITY)
Admission: EM | Admit: 2019-02-21 | Discharge: 2019-02-21 | Disposition: A | Discharge status: Home / Self Care | Payer: Medicaid Other | Attending: Emergency Medicine | Admitting: Emergency Medicine

## 2019-02-21 ENCOUNTER — Encounter (HOSPITAL_COMMUNITY): Payer: Self-pay

## 2019-02-21 DIAGNOSIS — F909 Attention-deficit hyperactivity disorder, unspecified type: Secondary | ICD-10-CM | POA: Diagnosis not present

## 2019-02-21 DIAGNOSIS — K047 Periapical abscess without sinus: Secondary | ICD-10-CM | POA: Diagnosis not present

## 2019-02-21 DIAGNOSIS — K0889 Other specified disorders of teeth and supporting structures: Secondary | ICD-10-CM | POA: Diagnosis present

## 2019-02-21 DIAGNOSIS — Z79899 Other long term (current) drug therapy: Secondary | ICD-10-CM | POA: Insufficient documentation

## 2019-02-21 DIAGNOSIS — J45909 Unspecified asthma, uncomplicated: Secondary | ICD-10-CM | POA: Diagnosis not present

## 2019-02-21 MED ORDER — CLINDAMYCIN HCL 150 MG PO CAPS
300.0000 mg | ORAL_CAPSULE | Freq: Once | ORAL | Status: AC
  Administered 2019-02-21: 300 mg via ORAL
  Filled 2019-02-21: qty 2

## 2019-02-21 MED ORDER — CLINDAMYCIN HCL 150 MG PO CAPS: 300.0000 mg | ORAL_CAPSULE | Freq: Three times a day (TID) | ORAL | 0 refills | Status: AC

## 2019-02-21 NOTE — ED Provider Notes (Signed)
[facility] EMERGENCY DEPARTMENT Provider Note   CSN: [id number] Arrival date & time: 02/21/19  1757    History   Chief Complaint Chief Complaint  Patient presents with  . Dental Pain    HPI Eugene Hicks is a 28 y.o. male, well known to this ed presenting with right lower dental pain which has been present for about 4 days.  He reports he has "poison" in the tooth, when asked to clarify, he is concerned for infection.  He denies any trauma to the tooth. His pain is constant and he reports swelling.  He has had no fevers, no ear pain, no drainage from the tooth. He is scheduled to see a dentist in [place] in 3 days.  He has had no medicines prior to arrival for pain.      The history is provided by the patient.    Past Medical History:  Diagnosis Date  . ADHD (attention deficit hyperactivity disorder)   . Asthma   . Back pain   . Shoulder pain     Patient Active Problem List   Diagnosis Date Noted  . Bipolar affective (HCC) 11/21/2013  . ADHD (attention deficit hyperactivity disorder)     Past Surgical History:  Procedure Laterality Date  . UPPER GI ENDOSCOPY          Home Medications    Prior to Admission medications   Medication Sig Start Date End Date Taking? Authorizing Provider  alprazolam Prudy Feeler) 2 MG tablet Take 1-2 mg by mouth See admin instructions. Take 2mg  twice daily and 1mg  at bedtime    [provider]  antipyrine-benzocaine Lyla Son) OTIC solution Place 3-4 drops into the right ear every 2 (two) hours as needed for ear pain. 08/21/18   Triplett, Tammy, PA-C  azithromycin (ZITHROMAX) 250 MG tablet One tablet daily until finished 08/21/18   Triplett, Tammy, PA-C  benzonatate (TESSALON) 100 MG capsule Take 2 capsules (200 mg total) by mouth 3 (three) times daily as needed. 12/11/18   Burgess Amor, PA-C  clindamycin (CLEOCIN) 150 MG capsule Take 2 capsules (300 mg total) by mouth 3 (three) times daily for 7 days. 02/21/19 02/28/19  Burgess Amor,  PA-C  doxycycline (VIBRAMYCIN) 100 MG capsule Take 1 capsule (100 mg total) 2 (two) times daily by mouth. 08/27/17   Elson Areas, PA-C  EPINEPHrine (EPI-PEN) 0.3 mg/0.3 mL SOAJ injection Inject 0.3 mLs (0.3 mg total) into the muscle once. 07/26/13   Donnetta Hutching, MD  lithium carbonate 300 MG capsule Take 300 mg by mouth 2 (two) times daily.     [provider]  loperamide (IMODIUM) 2 MG capsule Take 1 capsule (2 mg total) by mouth 4 (four) times daily as needed for diarrhea or loose stools. 12/11/18   Burgess Amor, PA-C  propranolol (INNOPRAN XL) 80 MG 24 hr capsule Take 80 mg by mouth daily.     [provider]  QUEtiapine (SEROQUEL) 300 MG tablet Take 300 mg by mouth every evening.     [provider]    Family History Family History  Problem Relation Age of Onset  . Stroke Father   . Seizures Mother   . Seizures Brother     Social History Social History   Tobacco Use  . Smoking status: Never Smoker  . Smokeless tobacco: Never Used  Substance Use Topics  . Alcohol use: No  . Drug use: No     Allergies   Codeine; Lactose intolerance (gi); Percocet [oxycodone-acetaminophen]; and Penicillins  Review of Systems Review of Systems  HENT: Positive for dental problem. Negative for sore throat.   Respiratory: Negative for shortness of breath.   Musculoskeletal: Negative for neck stiffness.     Physical Exam Updated Vital Signs BP 116/81 (BP Location: Right Arm)   Pulse (!) 53   Temp 98.4 F (36.9 C) (Oral)   Resp 16   Ht 5\' 8"  (1.727 m)   Wt 72.6 kg   SpO2 100%   BMI 24.33 kg/m   Physical Exam Constitutional:      General: He is not in acute distress.    Appearance: He is well-developed.  HENT:     Head: Normocephalic and atraumatic.     Jaw: No trismus or pain on movement.     Right Ear: Tympanic membrane and external ear normal.     Left Ear: Tympanic membrane and external ear normal.     Mouth/Throat:     Mouth: No oral lesions.      Dentition: Abnormal dentition. Dental tenderness, gingival swelling and dental caries present. No dental abscesses.     Comments: Chronically poor dentition with all of his teeth fractured/worn to the gingival line.  TTP right lower molars, mild gingival edema (extensively, not localized to the area of pain).  No abscess appreciated.  No cheek or facial induration or erythema.  No trismus.  Sublingual space soft. Eyes:     Conjunctiva/sclera: Conjunctivae normal.  Neck:     Musculoskeletal: Normal range of motion and neck supple. No neck rigidity.  Cardiovascular:     Rate and Rhythm: Normal rate.     Heart sounds: Normal heart sounds.  Pulmonary:     Effort: Pulmonary effort is normal.  Musculoskeletal: Normal range of motion.  Lymphadenopathy:     Cervical: No cervical adenopathy.  Skin:    General: Skin is warm and dry.     Findings: No erythema.  Neurological:     Mental Status: He is alert and oriented to person, place, and time.      ED Treatments / Results  Labs (all labs ordered are listed, but only abnormal results are displayed) Labs Reviewed - No data to display  EKG None  Radiology No results found.  Procedures Procedures (including critical care time)  Medications Ordered in ED Medications  clindamycin (CLEOCIN) capsule 300 mg (300 mg Oral Given 02/21/19 1913)     Initial Impression / Assessment and Plan / ED Course  I have reviewed the triage vital signs and the nursing notes.  Pertinent labs & imaging results that were available during my care of the patient were reviewed by me and considered in my medical decision making (see chart for details).       Chronic advanced dental decay with new pain, no obvious abscess but extensive gingivitis.  Suspect probable apical abscess/infection at localizing pain site. Clindamycin started, plan dentistry f/u as pt has arranged.  Final Clinical Impressions(s) / ED Diagnoses   Final diagnoses:  Dental  infection    ED Discharge Orders         Ordered    clindamycin (CLEOCIN) 150 MG capsule  3 times daily     02/21/19 1905           Victoriano Laindol, Elin Fenley, PA-C 02/21/19 1917    Jacalyn LefevreHaviland, Stevens Magwood, MD 02/21/19 [phone]

## 2019-02-21 NOTE — ED Triage Notes (Signed)
Pt presents to ED with complaints of tooth pain on right bottom side x 4 days. Pt has appt with dentist on 02/24/19

## 2019-02-21 NOTE — Discharge Instructions (Addendum)
Take your next dose of the antibiotics tomorrow morning.  Keep your appointment with your dentist on Friday.

## 2019-03-11 ENCOUNTER — Other Ambulatory Visit: Payer: Self-pay

## 2019-03-11 ENCOUNTER — Encounter (HOSPITAL_COMMUNITY): Payer: Self-pay | Admitting: Emergency Medicine

## 2019-03-11 ENCOUNTER — Emergency Department (HOSPITAL_COMMUNITY)
Admission: EM | Admit: 2019-03-11 | Discharge: 2019-03-11 | Disposition: A | Discharge status: Home / Self Care | Payer: Medicaid Other | Attending: Emergency Medicine | Admitting: Emergency Medicine

## 2019-03-11 DIAGNOSIS — F419 Anxiety disorder, unspecified: Secondary | ICD-10-CM | POA: Insufficient documentation

## 2019-03-11 DIAGNOSIS — Z765 Malingerer [conscious simulation]: Secondary | ICD-10-CM | POA: Diagnosis not present

## 2019-03-11 DIAGNOSIS — Z760 Encounter for issue of repeat prescription: Secondary | ICD-10-CM | POA: Insufficient documentation

## 2019-03-11 HISTORY — DX: Depression, unspecified: F32.A

## 2019-03-11 HISTORY — DX: Anxiety disorder, unspecified: F41.9

## 2019-03-11 MED ORDER — HYDROXYZINE HCL 25 MG PO TABS: 25.0000 mg | ORAL_TABLET | Freq: Three times a day (TID) | ORAL | 0 refills | Status: AC | PRN

## 2019-03-11 MED ORDER — HYDROXYZINE HCL 25 MG PO TABS
25.0000 mg | ORAL_TABLET | Freq: Once | ORAL | Status: DC
  Filled 2019-03-11: qty 1

## 2019-03-11 NOTE — ED Notes (Signed)
Pt not in room after asking for restroom  All restrooms are empty, his bracelet is on the counter   He has left without his meds being given  His prescription and discharge instructions

## 2019-03-11 NOTE — ED Notes (Signed)
Pt reports he was getting prescriptions for Xanax from Dr Omelia Blackwater "but he got in trouble and lost his license"  Reports he has an appt with Dr Karleen Hampshire in [place] on the 5th  He states he has been on Xanax "my whole life"  No meds for 4 days   Here for med

## 2019-03-11 NOTE — ED Triage Notes (Signed)
Pt states taking 2 mg Xanax 3-4 times a day. Has been without meds for 4 days.  Pt states being shaking, dizzy and headache

## 2019-03-11 NOTE — ED Notes (Signed)
Pt asked to go to restroom  Did not return   Bracelet on counter   Left without discharge instructions after not receiving requested meds

## 2019-03-11 NOTE — ED Provider Notes (Signed)
[facility] EMERGENCY DEPARTMENT Provider Note   CSN: [id number] Arrival date & time: 03/11/19  1806    History   Chief Complaint Chief Complaint  Patient presents with  . Withdrawal  . Medication Refill    HPI Eugene Hicks is a 28 y.o. male.     HPI  Pt was seen at 1905. Per pt, c/o gradual onset and persistence of constant "need for meds refill." Pt states he took his LD of xanax 4 days ago. Pt states he needs a prescription of xanax 2mg  TID-QID until 03/24/19 (2 weeks of med), when he has an appointment with a new mental health provider. States he "is in withdrawal," but cannot further describe this to me. Denies cough, fevers, known COVID+ exposure. Denies N/V/D, no abd pain, no CP/SOB.    Past Medical History:  Diagnosis Date  . ADHD (attention deficit hyperactivity disorder)   . Anxiety   . Asthma   . Back pain   . Depression   . Shoulder pain     Patient Active Problem List   Diagnosis Date Noted  . Bipolar affective (HCC) 11/21/2013  . ADHD (attention deficit hyperactivity disorder)     Past Surgical History:  Procedure Laterality Date  . UPPER GI ENDOSCOPY          Home Medications    Prior to Admission medications   Medication Sig Start Date End Date Taking? Authorizing Provider  alprazolam Prudy Feeler(XANAX) 2 MG tablet Take 1-2 mg by mouth See admin instructions. Take 2mg  twice daily and 1mg  at bedtime    [provider]  EPINEPHrine (EPI-PEN) 0.3 mg/0.3 mL SOAJ injection Inject 0.3 mLs (0.3 mg total) into the muscle once. 07/26/13   Donnetta Hutchingook, Brian, MD  hydrOXYzine (ATARAX/VISTARIL) 25 MG tablet Take 1 tablet (25 mg total) by mouth every 8 (eight) hours as needed for anxiety. 03/11/19   Jabarie JesterMcManus, Tilak Oakley, DO  lithium carbonate 300 MG capsule Take 300 mg by mouth 2 (two) times daily.     [provider]  loperamide (IMODIUM) 2 MG capsule Take 1 capsule (2 mg total) by mouth 4 (four) times daily as needed for diarrhea or loose stools. 12/11/18    Burgess AmorIdol, Julie, PA-C  propranolol (INNOPRAN XL) 80 MG 24 hr capsule Take 80 mg by mouth daily.     [provider]  QUEtiapine (SEROQUEL) 300 MG tablet Take 300 mg by mouth every evening.     [provider]    Family History Family History  Problem Relation Age of Onset  . Stroke Father   . Seizures Mother   . Seizures Brother     Social History Social History   Tobacco Use  . Smoking status: Never Smoker  . Smokeless tobacco: Never Used  Substance Use Topics  . Alcohol use: No  . Drug use: No     Allergies   Codeine; Lactose intolerance (gi); Percocet [oxycodone-acetaminophen]; and Penicillins   Review of Systems Review of Systems ROS: Statement: All systems negative except as marked or noted in the HPI; Constitutional: Negative for fever and chills. ; ; Eyes: Negative for eye pain, redness and discharge. ; ; ENMT: Negative for ear pain, hoarseness, nasal congestion, sinus pressure and sore throat. ; ; Cardiovascular: Negative for chest pain, palpitations, diaphoresis, dyspnea and peripheral edema. ; ; Respiratory: Negative for cough, wheezing and stridor. ; ; Gastrointestinal: Negative for nausea, vomiting, diarrhea, abdominal pain, blood in stool, hematemesis, jaundice and rectal bleeding. . ; ; Genitourinary: Negative for  dysuria, flank pain and hematuria. ; ; Musculoskeletal: Negative for back pain and neck pain. Negative for swelling and trauma.; ; Skin: Negative for pruritus, rash, abrasions, blisters, bruising and skin lesion.; ; Neuro: Negative for headache, lightheadedness and neck stiffness. Negative for weakness, altered level of consciousness, altered mental status, extremity weakness, paresthesias, involuntary movement, seizure and syncope.; Psych:  +anxiety. No SI, no SA, no HI, no hallucinations.        Physical Exam Updated Vital Signs BP 122/73 (BP Location: Right Arm)   Pulse 70   Temp 98.3 F (36.8 C) (Oral)   Resp 17   Ht  (1.727  m)   Wt 72.6 kg   SpO2 100%   BMI 24.33 kg/m   Physical Exam 1910: Physical examination:  Nursing notes reviewed; Vital signs and O2 SAT reviewed;  Constitutional: Well developed, Well nourished, Well hydrated, In no acute distress; Head:  Normocephalic, atraumatic; Eyes: EOMI, PERRL, No scleral icterus; ENMT: Mouth and pharynx normal, Mucous membranes moist; Neck: Supple, Full range of motion, No lymphadenopathy; Cardiovascular: Regular rate and rhythm, No murmur, rub, or gallop; Respiratory: Breath sounds clear & equal bilaterally, No rales, rhonchi, wheezes.  Speaking full sentences with ease, Normal respiratory effort/excursion; Chest: Nontender, Movement normal; Abdomen: Soft, Nontender, Nondistended, Normal bowel sounds; Genitourinary: No CVA tenderness; Extremities: Peripheral pulses normal, No tenderness, No edema, No calf edema or asymmetry.; Neuro: AA&Ox3, Major CN grossly intact.  Speech clear. No gross focal motor or sensory deficits in extremities. Climbs on and off stretcher easily by himself. Gait steady. No tremor..; Skin: Color normal, Warm, Dry.; Psych:  Calm, cooperative.    ED Treatments / Results  Labs (all labs ordered are listed, but only abnormal results are displayed)   EKG None  Radiology   Procedures Procedures (including critical care time)  Medications Ordered in ED Medications  hydrOXYzine (ATARAX/VISTARIL) tablet 25 mg (has no administration in time range)     Initial Impression / Assessment and Plan / ED Course  I have reviewed the triage vital signs and the nursing notes.  Pertinent labs & imaging results that were available during my care of the patient were reviewed by me and considered in my medical decision making (see chart for details).     MDM Reviewed: previous chart, nursing note and vitals     Eugene Hicks was evaluated in Emergency Department on 03/11/2019 for the symptoms described in the history of present illness. He was  evaluated in the context of the global COVID-19 pandemic, which necessitated consideration that the patient might be at risk for infection with the SARS-CoV-2 virus that causes COVID-19. Institutional protocols and algorithms that pertain to the evaluation of patients at risk for COVID-19 are in a state of rapid change based on information released by regulatory bodies including the CDC and federal and state organizations. These policies and algorithms were followed during the patient's care in the ED.     1915:  [place] accessed: pt has not filled benzo prescription since 12/2018.  Pt asked where he fills his rx [facility]). Pt initially told me that he did not run out of his medication, but when I asked how many pills he had left he told me "none" and he "took the last pill 4 days ago." Pt cannot describe his symptoms to me other than "it's the withdrawals from xanax." Pt is appears NAD, resps easy, not tremulous, has ambulated with steady gait, and has normal VS.  Doubt "withdrawal" at this time. Long hx of multiple ED visits for benzos and narcotic seeking behaviors. Pt encouraged to f/u with his PMD and mental health provider doctor for good continuity of care and control of his chronic medical conditions. Pt aware he will not be receiving benzo prescription. Pt verb understanding. Pt left the ED immediately after my examination; without receiving meds or d/c instructions.       Final Clinical Impressions(s) / ED Diagnoses   Final diagnoses:  Anxiety  Drug-seeking behavior       Jamaris Jester, DO 03/13/19 1527

## 2019-03-11 NOTE — Discharge Instructions (Signed)
Take the prescription as directed.  Call your regular medical doctor on Monday to schedule a follow up appointment within the next week.  Return to the Emergency Department immediately sooner if worsening.

## 2020-07-01 ENCOUNTER — Emergency Department (HOSPITAL_COMMUNITY)
Admission: EM | Admit: 2020-07-01 | Discharge: 2020-07-01 | Disposition: A | Discharge status: Home / Self Care | Payer: Medicaid Other | Attending: Emergency Medicine | Admitting: Emergency Medicine

## 2020-07-01 ENCOUNTER — Other Ambulatory Visit: Payer: Self-pay

## 2020-07-01 ENCOUNTER — Emergency Department (HOSPITAL_COMMUNITY): Payer: Medicaid Other

## 2020-07-01 ENCOUNTER — Encounter (HOSPITAL_COMMUNITY): Payer: Self-pay

## 2020-07-01 DIAGNOSIS — S51012A Laceration without foreign body of left elbow, initial encounter: Secondary | ICD-10-CM | POA: Diagnosis not present

## 2020-07-01 DIAGNOSIS — Z5321 Procedure and treatment not carried out due to patient leaving prior to being seen by health care provider: Secondary | ICD-10-CM | POA: Insufficient documentation

## 2020-07-01 DIAGNOSIS — W25XXXA Contact with sharp glass, initial encounter: Secondary | ICD-10-CM | POA: Insufficient documentation

## 2020-07-01 NOTE — ED Triage Notes (Signed)
Pt presents to ED with laceration to left elbow. Pt states glass cut him putting up a window.

## 2023-01-15 ENCOUNTER — Encounter (HOSPITAL_COMMUNITY): Payer: Self-pay

## 2023-01-15 ENCOUNTER — Emergency Department (HOSPITAL_COMMUNITY)
Admission: EM | Admit: 2023-01-15 | Discharge: 2023-01-16 | Disposition: A | Discharge status: Home / Self Care | Payer: Medicaid Other | Attending: Emergency Medicine | Admitting: Emergency Medicine

## 2023-01-15 ENCOUNTER — Other Ambulatory Visit: Payer: Self-pay

## 2023-01-15 DIAGNOSIS — K047 Periapical abscess without sinus: Secondary | ICD-10-CM | POA: Diagnosis present

## 2023-01-15 MED ORDER — CLINDAMYCIN HCL 150 MG PO CAPS
300.0000 mg | ORAL_CAPSULE | Freq: Once | ORAL | Status: AC
  Administered 2023-01-16: 300 mg via ORAL
  Filled 2023-01-15: qty 2

## 2023-01-15 MED ORDER — CLINDAMYCIN HCL 150 MG PO CAPS: 300.0000 mg | ORAL_CAPSULE | Freq: Four times a day (QID) | ORAL | 0 refills | Status: DC

## 2023-01-15 NOTE — ED Triage Notes (Signed)
Left lower jar swelling.  Has appt to have teeth removed

## 2023-01-15 NOTE — ED Provider Notes (Signed)
[facility] Provider Note   CSN: [id number] Arrival date & time: 01/15/23  2024     History  Chief Complaint  Patient presents with   Oral Swelling    Eugene Hicks is a 32 y.o. male.  Presents to the emergency department for evaluation of left lower jaw pain with swelling.  Father reports that the patient has significant dental problems and is scheduled to see an oral surgeon in 2 weeks.       Home Medications Prior to Admission medications   Medication Sig Start Date End Date Taking? Authorizing Provider  clindamycin (CLEOCIN) 150 MG capsule Take 2 capsules (300 mg total) by mouth 4 (four) times daily. 01/15/23  Yes Kegan Mckeithan, Gwenyth Allegra, MD  alprazolam Duanne Moron) 2 MG tablet Take 1-2 mg by mouth See admin instructions. Take 2mg  twice daily and 1mg  at bedtime    [provider]  EPINEPHrine (EPI-PEN) 0.3 mg/0.3 mL SOAJ injection Inject 0.3 mLs (0.3 mg total) into the muscle once. 07/26/13   Nat Christen, MD  hydrOXYzine (ATARAX/VISTARIL) 25 MG tablet Take 1 tablet (25 mg total) by mouth every 8 (eight) hours as needed for anxiety. 03/11/19   Francine Graven, DO  lithium carbonate 300 MG capsule Take 300 mg by mouth 2 (two) times daily.     [provider]  loperamide (IMODIUM) 2 MG capsule Take 1 capsule (2 mg total) by mouth 4 (four) times daily as needed for diarrhea or loose stools. 12/11/18   Evalee Jefferson, PA-C  propranolol (INNOPRAN XL) 80 MG 24 hr capsule Take 80 mg by mouth daily.     [provider]  QUEtiapine (SEROQUEL) 300 MG tablet Take 300 mg by mouth every evening.     [provider]      Allergies    Codeine, Lactose intolerance (gi), Percocet [oxycodone-acetaminophen], and Penicillins    Review of Systems   Review of Systems  Physical Exam Updated Vital Signs BP 130/82   Pulse 90   Temp 98.3 F (36.8 C) (Oral)   Resp 16   Ht 5\' 3"  (1.6 m)   Wt 60 kg   SpO2 100%   BMI  23.43 kg/m  Physical Exam Vitals and nursing note reviewed.  Constitutional:      General: He is not in acute distress.    Appearance: He is well-developed.  HENT:     Head: Normocephalic and atraumatic.      Mouth/Throat:     Mouth: Mucous membranes are moist.     Dentition: Abnormal dentition. Dental tenderness, gingival swelling, dental caries (Widespread) and dental abscesses (Left lower, no pointing or obvious fluctuance to drain) present.  Eyes:     General: Vision grossly intact. Gaze aligned appropriately.     Extraocular Movements: Extraocular movements intact.     Conjunctiva/sclera: Conjunctivae normal.  Cardiovascular:     Rate and Rhythm: Normal rate and regular rhythm.     Pulses: Normal pulses.     Heart sounds: Normal heart sounds, S1 normal and S2 normal. No murmur heard.    No friction rub. No gallop.  Pulmonary:     Effort: Pulmonary effort is normal. No respiratory distress.     Breath sounds: Normal breath sounds.  Abdominal:     Palpations: Abdomen is soft.     Tenderness: There is no abdominal tenderness. There is no guarding or rebound.     Hernia: No hernia is present.  Musculoskeletal:  General: No swelling.     Cervical back: Full passive range of motion without pain, normal range of motion and neck supple. No pain with movement, spinous process tenderness or muscular tenderness. Normal range of motion.     Right lower leg: No edema.     Left lower leg: No edema.  Skin:    General: Skin is warm and dry.     Capillary Refill: Capillary refill takes less than 2 seconds.     Findings: No ecchymosis, erythema, lesion or wound.  Neurological:     Mental Status: He is alert and oriented to person, place, and time.     GCS: GCS eye subscore is 4. GCS verbal subscore is 5. GCS motor subscore is 6.     Cranial Nerves: Cranial nerves 2-12 are intact.     Sensory: Sensation is intact.     Motor: Motor function is intact. No weakness or abnormal muscle  tone.     Coordination: Coordination is intact.  Psychiatric:        Mood and Affect: Mood normal.        Speech: Speech normal.        Behavior: Behavior normal.     ED Results / Procedures / Treatments   Labs (all labs ordered are listed, but only abnormal results are displayed) Labs Reviewed - No data to display  EKG None  Radiology No results found.  Procedures Procedures    Medications Ordered in ED Medications  clindamycin (CLEOCIN) capsule 300 mg (has no administration in time range)    ED Course/ Medical Decision Making/ A&P                             Medical Decision Making Risk Prescription drug management.   Presents with facial swelling.  Patient has a small amount of swelling on the left side of his mandible, lateral to the mandible.  He does have corresponding gingival swelling on the inside with poor dentition.  This is consistent with early dental abscess.  No floor of mouth or midline neck swelling to suggest Ludwig's angina.  He appears well.  He is afebrile.  He is swallowing without difficulty.        Final Clinical Impression(s) / ED Diagnoses Final diagnoses:  Dental abscess    Rx / DC Orders ED Discharge Orders          Ordered    clindamycin (CLEOCIN) 150 MG capsule  4 times daily        01/15/23 2352              Orpah Greek, MD 01/15/23 2352

## 2023-01-15 NOTE — ED Notes (Addendum)
Pt's father states pt has an appt to get teeth cut out in two weeks Pt does not respond to this Rns questions at this time.

## 2023-10-29 ENCOUNTER — Encounter (HOSPITAL_COMMUNITY): Payer: Self-pay

## 2023-10-29 ENCOUNTER — Other Ambulatory Visit: Payer: Self-pay

## 2023-10-29 ENCOUNTER — Emergency Department (HOSPITAL_COMMUNITY)
Admission: EM | Admit: 2023-10-29 | Discharge: 2023-10-30 | Disposition: A | Discharge status: Home / Self Care | Payer: MEDICAID | Attending: Emergency Medicine | Admitting: Emergency Medicine

## 2023-10-29 DIAGNOSIS — R410 Disorientation, unspecified: Secondary | ICD-10-CM | POA: Insufficient documentation

## 2023-10-29 DIAGNOSIS — T50905A Adverse effect of unspecified drugs, medicaments and biological substances, initial encounter: Secondary | ICD-10-CM

## 2023-10-29 DIAGNOSIS — T438X5A Adverse effect of other psychotropic drugs, initial encounter: Secondary | ICD-10-CM | POA: Insufficient documentation

## 2023-10-29 DIAGNOSIS — R251 Tremor, unspecified: Secondary | ICD-10-CM | POA: Diagnosis not present

## 2023-10-29 LAB — COMPREHENSIVE METABOLIC PANEL
ALT: 22 U/L (ref 0–44)
AST: 19 U/L (ref 15–41)
Albumin: 4.6 g/dL (ref 3.5–5.0)
Alkaline Phosphatase: 51 U/L (ref 38–126)
Anion gap: 6 (ref 5–15)
BUN: 11 mg/dL (ref 6–20)
CO2: 24 mmol/L (ref 22–32)
Calcium: 10.1 mg/dL (ref 8.9–10.3)
Chloride: 104 mmol/L (ref 98–111)
Creatinine, Ser: 0.9 mg/dL (ref 0.61–1.24)
GFR, Estimated: >60 mL/min (ref >60)
Glucose, Bld: 121 mg/dL — ABNORMAL HIGH (ref 70–99)
Potassium: 3.6 mmol/L (ref 3.5–5.1)
Sodium: 134 mmol/L — ABNORMAL LOW (ref 135–145)
Total Bilirubin: 0.9 mg/dL (ref 0.0–1.2)
Total Protein: 7.3 g/dL (ref 6.5–8.1)

## 2023-10-29 LAB — CBC WITH DIFFERENTIAL/PLATELET
Abs Immature Granulocytes: 0.03 10*3/uL (ref 0.00–0.07)
Basophils Absolute: 0 10*3/uL (ref 0.0–0.1)
Basophils Relative: 0 %
Eosinophils Absolute: 0.1 10*3/uL (ref 0.0–0.5)
Eosinophils Relative: 1 %
HCT: 35 % — ABNORMAL LOW (ref 39.0–52.0)
Hemoglobin: 11.3 g/dL — ABNORMAL LOW (ref 13.0–17.0)
Immature Granulocytes: 0 %
Lymphocytes Relative: 10 %
Lymphs Abs: 1 10*3/uL (ref 0.7–4.0)
MCH: 20.3 pg — ABNORMAL LOW (ref 26.0–34.0)
MCHC: 32.3 g/dL (ref 30.0–36.0)
MCV: 62.8 fL — ABNORMAL LOW (ref 80.0–100.0)
Monocytes Absolute: 0.5 10*3/uL (ref 0.1–1.0)
Monocytes Relative: 5 %
Neutro Abs: 8.3 10*3/uL — ABNORMAL HIGH (ref 1.7–7.7)
Neutrophils Relative %: 84 %
Platelets: 207 10*3/uL (ref 150–400)
RBC: 5.57 MIL/uL (ref 4.22–5.81)
RDW: 14.9 % (ref 11.5–15.5)
WBC: 9.9 10*3/uL (ref 4.0–10.5)
nRBC: 0 % (ref 0.0–0.2)

## 2023-10-29 LAB — CK: Total CK: 76 U/L (ref 49–397)

## 2023-10-29 LAB — LACTIC ACID, PLASMA: Lactic Acid, Venous: 1.3 mmol/L (ref 0.5–1.9)

## 2023-10-29 LAB — LITHIUM LEVEL: Lithium Lvl: 0.78 mmol/L (ref 0.60–1.20)

## 2023-10-29 MED ORDER — LORAZEPAM 2 MG/ML IJ SOLN
1.0000 mg | Freq: Once | INTRAMUSCULAR | Status: AC
  Administered 2023-10-29: 1 mg via INTRAVENOUS
  Filled 2023-10-29: qty 1

## 2023-10-29 MED ORDER — DIPHENHYDRAMINE HCL 50 MG/ML IJ SOLN
50.0000 mg | Freq: Once | INTRAMUSCULAR | Status: AC
  Administered 2023-10-29: 50 mg via INTRAMUSCULAR
  Filled 2023-10-29: qty 1

## 2023-10-29 MED ORDER — SODIUM CHLORIDE 0.9 % IV BOLUS
1000.0000 mL | Freq: Once | INTRAVENOUS | Status: AC
  Administered 2023-10-29: 1000 mL via INTRAVENOUS

## 2023-10-29 NOTE — ED Notes (Signed)
 ED Provider at bedside.

## 2023-10-29 NOTE — ED Provider Notes (Signed)
 Care assumed from Dr. Cleotilde, patient with apparent medication reaction currently pending lithium  level.  Plan is for discharge if lithium  level is not toxic.  Lithium  level is therapeutic.  I am discharging patient with instructions to continue using over-the-counter diphenhydramine  as needed.  Return precautions discussed.   Raford Lenis, MD 10/30/23 [phone]

## 2023-10-29 NOTE — ED Triage Notes (Signed)
 Pt BIB [facility] due to complaints of tremors and stiffness caused by new psych medication shot. Pt got shot today for the first time and EMS was told he has been like this since he got home. Pt can answer questions and is A&Ox4 but Is stiff and shakey. FYI pt has bedbugs.

## 2023-10-29 NOTE — ED Provider Notes (Signed)
 [place] EMERGENCY DEPARTMENT AT [facility] Provider Note   CSN: [id number] Arrival date & time: 10/29/23  2111     History  Chief Complaint  Patient presents with   Medication Reaction    Eugene Hicks is a 33 y.o. male.  HPI   This patient is a 33 year old male well-known to the emergency department, history of autism, history of bipolar disorder, the patient had been seen at some point in the last week and received an injectable psychiatric medication of which he does not know the name at a clinic called [facility].  The patient reports very little as he only speaks a couple of words occasionally but reports anxiety, paramedics reported tachycardia, and stiffness and rigidity, the patient cannot tell me what the name of the medicine was or where he got it, I can see a note from his family doctor's office today where he showed up because of altered mental status and shaking however it appears that the patient had left AGAINST MEDICAL ADVICE at some point during the visit.  Review of this medical record shows that the patient has been seen in the ED multiple times although it has been approximately 9 months since he was seen for a dental abscess, visits prior to that for things ranging from anxiety to painful conditions to minor infections.  Based on the medications in the file he has been prescribed things such as alprazolam , lithium , Seroquel and propranolol in the past  Home Medications Prior to Admission medications   Medication Sig Start Date End Date Taking? Authorizing Provider  alprazolam  (XANAX ) 2 MG tablet Take 1-2 mg by mouth See admin instructions. Take 2mg  twice daily and 1mg  at bedtime    [provider]  clindamycin  (CLEOCIN ) 150 MG capsule Take 2 capsules (300 mg total) by mouth 4 (four) times daily. 01/15/23   Haze Lonni PARAS, MD  EPINEPHrine  (EPI-PEN) 0.3 mg/0.3 mL SOAJ injection Inject 0.3 mLs (0.3 mg total) into the muscle  once. 07/26/13   Bluford Rogue, MD  hydrOXYzine  (ATARAX /VISTARIL ) 25 MG tablet Take 1 tablet (25 mg total) by mouth every 8 (eight) hours as needed for anxiety. 03/11/19   Joyice Sauer, DO  lithium  carbonate 300 MG capsule Take 300 mg by mouth 2 (two) times daily.     [provider]  loperamide  (IMODIUM ) 2 MG capsule Take 1 capsule (2 mg total) by mouth 4 (four) times daily as needed for diarrhea or loose stools. 12/11/18   Idol, Julie, PA-C  propranolol (INNOPRAN XL) 80 MG 24 hr capsule Take 80 mg by mouth daily.     [provider]  QUEtiapine (SEROQUEL) 300 MG tablet Take 300 mg by mouth every evening.     [provider]      Allergies    Codeine, Lactose intolerance (gi), Percocet [oxycodone -acetaminophen ], and Penicillins    Review of Systems   Review of Systems  Unable to perform ROS: Mental status change    Physical Exam Updated Vital Signs BP 121/84   Pulse (!) 121   Temp 99 F (37.2 C) (Oral)   Resp 19   Ht 1.727 m (5' 8)   Wt 63.5 kg   SpO2 95%   BMI 21.29 kg/m  Physical Exam Vitals and nursing note reviewed.  Constitutional:      General: He is in acute distress.     Appearance: He is well-developed.  HENT:     Head: Normocephalic and atraumatic.  Mouth/Throat:     Pharynx: No oropharyngeal exudate.  Eyes:     General: No scleral icterus.       Right eye: No discharge.        Left eye: No discharge.     Conjunctiva/sclera: Conjunctivae normal.     Pupils: Pupils are equal, round, and reactive to light.  Neck:     Thyroid : No thyromegaly.     Vascular: No JVD.  Cardiovascular:     Rate and Rhythm: Regular rhythm. Tachycardia present.     Heart sounds: Normal heart sounds. No murmur heard.    No friction rub. No gallop.  Pulmonary:     Effort: Pulmonary effort is normal. No respiratory distress.     Breath sounds: Normal breath sounds. No wheezing or rales.  Abdominal:     General: Bowel sounds are normal. There is no  distension.     Palpations: Abdomen is soft. There is no mass.     Tenderness: There is no abdominal tenderness.  Musculoskeletal:        General: No tenderness. Normal range of motion.     Cervical back: Normal range of motion and neck supple.     Right lower leg: No edema.     Left lower leg: No edema.     Comments: Muscle rigidity both upper and lower extremities, no active tremor, no seizure-like activity, able to follow commands but very slowly and waxy  Lymphadenopathy:     Cervical: No cervical adenopathy.  Skin:    General: Skin is warm and dry.     Findings: No erythema or rash.  Neurological:     Mental Status: He is alert.     Coordination: Coordination normal.     Comments: The patient is looking forward, he is able to answer questions but with significant delay and very slowly in his responses  Psychiatric:     Comments: Very flat affect, seems withdrawn     ED Results / Procedures / Treatments   Labs (all labs ordered are listed, but only abnormal results are displayed) Labs Reviewed  CBC WITH DIFFERENTIAL/PLATELET  COMPREHENSIVE METABOLIC PANEL  LACTIC ACID, PLASMA  LACTIC ACID, PLASMA    EKG None  Radiology No results found.  Procedures Procedures    Medications Ordered in ED Medications  diphenhydrAMINE  (BENADRYL ) injection 50 mg (has no administration in time range)  LORazepam  (ATIVAN ) injection 1 mg (has no administration in time range)    ED Course/ Medical Decision Making/ A&P                                 Medical Decision Making Amount and/or Complexity of Data Reviewed Labs: ordered. ECG/medicine tests: ordered.  Risk Prescription drug management.   Patient presents with tachycardia, muscular rigidity and complaint of tremor with confusion, there is no other history to be obtained, the father is not available by phone, he lives with his father.  Medical record from the visit to the office today is not helpful as the patient left  AGAINST MEDICAL ADVICE prior to being evaluated or worked up.  There is a possibility this could be related to a medication side effect or medication reaction, it is not clear and we do not know what the medication is but given his dystonic appearance we will start with an antihistamine.  Also obtain labs including lithium  level, lactate, basic labs, IV access.  The patient is  afebrile at 99 degrees and his EKG shows sinus tachycardia without arrhythmia or ischemia  Laboratory workup is reassuring without leukocytosis or anemia of any concern, normal metabolic panel, lactate of 1.3 and a CK of 76.  Lithium  is pending at the time of change of shift, the patient was given both Benadryl  and Ativan  and heart rate is now about 101 bpm, normotensive, afebrile, less stiff, more awake.  He has had 2 family members arrive both of who are sleeping in the room, when I asked them what was going on they think he was given some injectable medicine at the psychiatric office but they are not sure what it was called, they states it was a antipsychotic.  The patient has had some of the symptoms for about a week, today he seemed a little bit more symptomatic than he had on previous days.  He does appear well-appearing at this time, he has been given some IV fluids, I think he can go home and use antihistamines as needed and follow-up with a psychiatrist.  He does not appear to be septic or in any other life-threatening event  At the time of change of shift care signed out to Dr. Alm Lias to follow-up lithium  level and determine disposition which anticipate to be home if that is okay        Final Clinical Impression(s) / ED Diagnoses Final diagnoses:  None    Rx / DC Orders ED Discharge Orders     None         Cleotilde Rogue, MD 10/29/23 2256

## 2023-10-30 LAB — LACTIC ACID, PLASMA: Lactic Acid, Venous: 0.9 mmol/L (ref 0.5–1.9)

## 2023-10-30 NOTE — Discharge Instructions (Addendum)
 Take diphenhydramine (Benadryl) every 4 hours as needed to help control the stiffness.  Return if symptoms or not being adequately controlled at home.

## 2023-11-01 ENCOUNTER — Encounter (HOSPITAL_COMMUNITY): Payer: Self-pay

## 2023-11-01 ENCOUNTER — Emergency Department (HOSPITAL_COMMUNITY)
Admission: EM | Admit: 2023-11-01 | Discharge: 2023-11-02 | Disposition: A | Discharge status: Home / Self Care | Payer: MEDICAID | Attending: Emergency Medicine | Admitting: Emergency Medicine

## 2023-11-01 ENCOUNTER — Other Ambulatory Visit: Payer: Self-pay

## 2023-11-01 DIAGNOSIS — J45909 Unspecified asthma, uncomplicated: Secondary | ICD-10-CM | POA: Insufficient documentation

## 2023-11-01 DIAGNOSIS — R29898 Other symptoms and signs involving the musculoskeletal system: Secondary | ICD-10-CM | POA: Diagnosis present

## 2023-11-01 NOTE — ED Triage Notes (Signed)
 Pt BIB [facility] c/o tremors and stiffness since the snow started. Pt not answering any questions. Vitals stable.

## 2023-11-02 LAB — COMPREHENSIVE METABOLIC PANEL
ALT: 34 U/L (ref 0–44)
AST: 50 U/L — ABNORMAL HIGH (ref 15–41)
Albumin: 4.6 g/dL (ref 3.5–5.0)
Alkaline Phosphatase: 58 U/L (ref 38–126)
Anion gap: 8 (ref 5–15)
BUN: 14 mg/dL (ref 6–20)
CO2: 23 mmol/L (ref 22–32)
Calcium: 9.7 mg/dL (ref 8.9–10.3)
Chloride: 103 mmol/L (ref 98–111)
Creatinine, Ser: 0.87 mg/dL (ref 0.61–1.24)
GFR, Estimated: >60 mL/min (ref >60)
Glucose, Bld: 110 mg/dL — ABNORMAL HIGH (ref 70–99)
Potassium: 3.4 mmol/L — ABNORMAL LOW (ref 3.5–5.1)
Sodium: 134 mmol/L — ABNORMAL LOW (ref 135–145)
Total Bilirubin: 0.8 mg/dL (ref 0.0–1.2)
Total Protein: 7.4 g/dL (ref 6.5–8.1)

## 2023-11-02 LAB — CBC
HCT: 36.9 % — ABNORMAL LOW (ref 39.0–52.0)
Hemoglobin: 11.8 g/dL — ABNORMAL LOW (ref 13.0–17.0)
MCH: 20.1 pg — ABNORMAL LOW (ref 26.0–34.0)
MCHC: 32 g/dL (ref 30.0–36.0)
MCV: 62.9 fL — ABNORMAL LOW (ref 80.0–100.0)
Platelets: 205 10*3/uL (ref 150–400)
RBC: 5.87 MIL/uL — ABNORMAL HIGH (ref 4.22–5.81)
RDW: 15.2 % (ref 11.5–15.5)
WBC: 8.9 10*3/uL (ref 4.0–10.5)
nRBC: 0 % (ref 0.0–0.2)

## 2023-11-02 LAB — ACETAMINOPHEN LEVEL: Acetaminophen (Tylenol), Serum: <10 ug/mL — ABNORMAL LOW (ref 10–30)

## 2023-11-02 LAB — SALICYLATE LEVEL: Salicylate Lvl: <7 mg/dL — ABNORMAL LOW (ref 7.0–30.0)

## 2023-11-02 LAB — ETHANOL: Alcohol, Ethyl (B): <10 mg/dL (ref <10)

## 2023-11-02 LAB — LITHIUM LEVEL: Lithium Lvl: 0.2 mmol/L — ABNORMAL LOW (ref 0.60–1.20)

## 2023-11-02 MED ORDER — LORAZEPAM 2 MG/ML IJ SOLN
2.0000 mg | Freq: Once | INTRAMUSCULAR | Status: AC
  Administered 2023-11-02: 2 mg via INTRAMUSCULAR
  Filled 2023-11-02: qty 1

## 2023-11-02 NOTE — Discharge Instructions (Signed)
 You were evaluated in the Emergency Department and after careful evaluation, we did not find any emergent condition requiring admission or further testing in the hospital.  Your exam/testing today was overall reassuring.  Keep your follow-up later today to discuss your symptoms.  Please return to the Emergency Department if you experience any worsening of your condition.  Thank you for allowing us  to be a part of your care.

## 2023-11-02 NOTE — ED Provider Notes (Signed)
 [facility] Emergency Department Provider Note MRN:  [id number]  Arrival date & time: 11/02/23     Chief Complaint   Rigidity History of Present Illness   Eugene Hicks is a 33 y.o. year-old male with a history of bipolar disorder presenting to the ED with chief complaint of rigidity.  Patient has been stiff in the muscles for the past 4 days.  Was here in the emergency department 4 days ago and was given a shot of lorazepam  which helped for a while.  No fever, no other complaints.  Review of Systems  A thorough review of systems was obtained and all systems are negative except as noted in the HPI and PMH.   Patient's Health History    Past Medical History:  Diagnosis Date   ADHD (attention deficit hyperactivity disorder)    Anxiety    Asthma    Back pain    Depression    Shoulder pain     Past Surgical History:  Procedure Laterality Date   UPPER GI ENDOSCOPY      Family History  Problem Relation Age of Onset   Stroke Father    Seizures Mother    Seizures Brother     Social History   Socioeconomic History   Marital status: Single    Spouse name: Not on file   Number of children: Not on file   Years of education: Not on file   Highest education level: Not on file  Occupational History   Not on file  Tobacco Use   Smoking status: Never   Smokeless tobacco: Never  Vaping Use   Vaping status: Never Used  Substance and Sexual Activity   Alcohol use: No   Drug use: No   Sexual activity: Never  Other Topics Concern   Not on file  Social History Narrative   Not on file   Social Drivers of Health   Financial Resource Strain: Not on file  Food Insecurity: Not on file  Transportation Needs: Not on file  Physical Activity: Not on file  Stress: Not on file  Social Connections: Not on file  Intimate Partner Violence: Not on file     Physical Exam   Vitals:   11/01/23 2306 11/02/23 0100  BP: 131/85 (!) 140/101  Pulse: (!) 111  (!) 110  Resp: 16   Temp: 98.9 F (37.2 C)   SpO2: 97% 95%    CONSTITUTIONAL: Well-appearing, NAD NEURO/PSYCH: Awake and alert, does not talk or answer questions, diffusely rigid in the arms and legs EYES:  eyes equal and reactive ENT/NECK:  no LAD, no JVD CARDIO: Regular rate, well-perfused, normal S1 and S2 PULM:  CTAB no wheezing or rhonchi GI/GU:  non-distended, non-tender MSK/SPINE:  No gross deformities, no edema SKIN:  no rash, atraumatic   *Additional and/or pertinent findings included in MDM below  Diagnostic and Interventional Summary    EKG Interpretation Date/Time:    Ventricular Rate:    PR Interval:    QRS Duration:    QT Interval:    QTC Calculation:   R Axis:      Text Interpretation:         Labs Reviewed  CBC - Abnormal; Notable for the following components:      Result Value   RBC 5.87 (*)    Hemoglobin 11.8 (*)    HCT 36.9 (*)    MCV 62.9 (*)    MCH 20.1 (*)    All other components within  normal limits  COMPREHENSIVE METABOLIC PANEL - Abnormal; Notable for the following components:   Sodium 134 (*)    Potassium 3.4 (*)    Glucose, Bld 110 (*)    AST 50 (*)    All other components within normal limits  LITHIUM  LEVEL - Abnormal; Notable for the following components:   Lithium  Lvl 0.20 (*)    All other components within normal limits  SALICYLATE LEVEL - Abnormal; Notable for the following components:   Salicylate Lvl <7.0 (*)    All other components within normal limits  ACETAMINOPHEN  LEVEL - Abnormal; Notable for the following components:   Acetaminophen  (Tylenol ), Serum <10 (*)    All other components within normal limits  ETHANOL    No orders to display    Medications  LORazepam  (ATIVAN ) injection 2 mg (has no administration in time range)     Procedures  /  Critical Care Procedures  ED Course and Medical Decision Making  Initial Impression and Ddx Overall doubt emergent process given the patient has had this rigidity for the  past 4 days.  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  Past medical/surgical history that increases complexity of ED encounter: Bipolar disorder  Interpretation of Diagnostics I personally reviewed the laboratory assessment and my interpretation is as follows: No significant blood count or electrolyte disturbance, lithium  level is not elevated  EKG with sinus rhythm  Patient Reassessment and Ultimate Disposition/Management     Provided with additional dose of Ativan , has follow-up tomorrow afternoon with his regular doctor.  Patient management required discussion with the following services or consulting groups:  None  Complexity of Problems Addressed Acute illness or injury that poses threat of life of bodily function  Additional Data Reviewed and Analyzed Further history obtained from: Further history from spouse/family member  Additional Factors Impacting ED Encounter Risk None  Ozell HERO. Theadore, MD [facility] Emergency Medicine [facility] [email or web address]  Final Clinical Impressions(s) / ED Diagnoses     ICD-10-CM   1. Rigidity  [id number]       ED Discharge Orders     None        Discharge Instructions Discussed with and Provided to Patient:    Discharge Instructions      You were evaluated in the Emergency Department and after careful evaluation, we did not find any emergent condition requiring admission or further testing in the hospital.  Your exam/testing today was overall reassuring.  Keep your follow-up later today to discuss your symptoms.  Please return to the Emergency Department if you experience any worsening of your condition.  Thank you for allowing us  to be a part of your care.       Theadore Ozell HERO, MD 11/02/23 [phone]

## 2024-02-06 ENCOUNTER — Encounter (HOSPITAL_COMMUNITY): Payer: Self-pay | Admitting: Emergency Medicine

## 2024-02-06 ENCOUNTER — Emergency Department (HOSPITAL_COMMUNITY)
Admission: EM | Admit: 2024-02-06 | Discharge: 2024-02-06 | Disposition: A | Discharge status: Home / Self Care | Payer: MEDICAID | Attending: Emergency Medicine | Admitting: Emergency Medicine

## 2024-02-06 ENCOUNTER — Other Ambulatory Visit: Payer: Self-pay

## 2024-02-06 DIAGNOSIS — K0889 Other specified disorders of teeth and supporting structures: Secondary | ICD-10-CM | POA: Diagnosis present

## 2024-02-06 DIAGNOSIS — K029 Dental caries, unspecified: Secondary | ICD-10-CM | POA: Insufficient documentation

## 2024-02-06 MED ORDER — CLINDAMYCIN HCL 150 MG PO CAPS
300.0000 mg | ORAL_CAPSULE | Freq: Once | ORAL | Status: AC
  Administered 2024-02-06: 300 mg via ORAL
  Filled 2024-02-06: qty 2

## 2024-02-06 MED ORDER — CLINDAMYCIN HCL 300 MG PO CAPS: 300.0000 mg | ORAL_CAPSULE | Freq: Four times a day (QID) | ORAL | 0 refills | Status: AC

## 2024-02-06 NOTE — ED Provider Notes (Signed)
 [place] EMERGENCY DEPARTMENT AT [facility] Provider Note   CSN: [id number] Arrival date & time: 02/06/24  0204     History  Chief Complaint  Patient presents with   Dental Pain   Weakness    Eugene Hicks is a 33 y.o. male.  Patient is a 33 year old male presenting with complaints of dental pain.  He has history of poor dentition.  Pain worse with eating or drinking.  No alleviating factors.       Home Medications Prior to Admission medications   Medication Sig Start Date End Date Taking? Authorizing Provider  clindamycin  (CLEOCIN ) 300 MG capsule Take 1 capsule (300 mg total) by mouth 4 (four) times daily. X 7 days 02/06/24  Yes Mont Jagoda, Dufm Gibbon, MD  alprazolam  (XANAX ) 2 MG tablet Take 1-2 mg by mouth See admin instructions. Take 2mg  twice daily and 1mg  at bedtime    [provider]  EPINEPHrine  (EPI-PEN) 0.3 mg/0.3 mL SOAJ injection Inject 0.3 mLs (0.3 mg total) into the muscle once. 07/26/13   Latanya Poisson, MD  hydrOXYzine  (ATARAX /VISTARIL ) 25 MG tablet Take 1 tablet (25 mg total) by mouth every 8 (eight) hours as needed for anxiety. 03/11/19   Shara Das, DO  lithium  carbonate 300 MG capsule Take 300 mg by mouth 2 (two) times daily.     [provider]  loperamide  (IMODIUM ) 2 MG capsule Take 1 capsule (2 mg total) by mouth 4 (four) times daily as needed for diarrhea or loose stools. 12/11/18   Idol, Julie, PA-C  propranolol (INNOPRAN XL) 80 MG 24 hr capsule Take 80 mg by mouth daily.     [provider]  QUEtiapine (SEROQUEL) 300 MG tablet Take 300 mg by mouth every evening.     [provider]      Allergies    Codeine, Lactose intolerance (gi), Percocet [oxycodone -acetaminophen ], and Penicillins    Review of Systems   Review of Systems  All other systems reviewed and are negative.   Physical Exam Updated Vital Signs BP 136/80 (BP Location: Right Arm)   Pulse (!) 58   Temp 98.1 F (36.7 C) (Oral)   Resp 16    Ht 5\' 8"  (1.727 m)   Wt 55.8 kg   SpO2 100%   BMI 18.70 kg/m  Physical Exam Vitals and nursing note reviewed.  Constitutional:      Appearance: Normal appearance.  HENT:     Mouth/Throat:     Mouth: Mucous membranes are moist.     Comments: Patient with poor dentition throughout. Skin:    General: Skin is warm and dry.  Neurological:     Mental Status: He is alert and oriented to person, place, and time.     ED Results / Procedures / Treatments   Labs (all labs ordered are listed, but only abnormal results are displayed) Labs Reviewed - No data to display  EKG None  Radiology No results found.  Procedures Procedures    Medications Ordered in ED Medications  clindamycin  (CLEOCIN ) capsule 300 mg (has no administration in time range)    ED Course/ Medical Decision Making/ A&P  Patient with dental caries.  Will be treated with clindamycin  and follow-up as needed.  Final Clinical Impression(s) / ED Diagnoses Final diagnoses:  Pain due to dental caries    Rx / DC Orders ED Discharge Orders          Ordered    clindamycin  (CLEOCIN ) 300 MG capsule  4 times daily  02/06/24 1610              Orvilla Blander, MD 02/06/24 [id number]

## 2024-02-06 NOTE — ED Notes (Signed)
Went over d/c paperwork at this time with patient. Pt had no questions, comments or concerns after review and verbally understood them.

## 2024-02-06 NOTE — ED Triage Notes (Signed)
 Pt here with c/o L upper dental pain and "dehydrated feeling" (per father).

## 2024-02-06 NOTE — Discharge Instructions (Signed)
 Begin taking clindamycin  as prescribed.  Drink plenty of fluids and get plenty of rest.  Follow-up with dentistry in the next week.

## 2024-02-19 ENCOUNTER — Other Ambulatory Visit: Payer: Self-pay

## 2024-02-19 ENCOUNTER — Encounter (HOSPITAL_COMMUNITY): Payer: Self-pay

## 2024-02-19 ENCOUNTER — Emergency Department (HOSPITAL_COMMUNITY)
Admission: EM | Admit: 2024-02-19 | Discharge: 2024-02-20 | Disposition: A | Discharge status: Home / Self Care | Payer: MEDICAID | Attending: Emergency Medicine | Admitting: Emergency Medicine

## 2024-02-19 DIAGNOSIS — R531 Weakness: Secondary | ICD-10-CM

## 2024-02-19 DIAGNOSIS — E876 Hypokalemia: Secondary | ICD-10-CM | POA: Diagnosis not present

## 2024-02-19 LAB — COMPREHENSIVE METABOLIC PANEL WITH GFR
ALT: 32 U/L (ref 0–44)
AST: 19 U/L (ref 15–41)
Albumin: 4.2 g/dL (ref 3.5–5.0)
Alkaline Phosphatase: 72 U/L (ref 38–126)
Anion gap: 10 (ref 5–15)
BUN: 21 mg/dL — ABNORMAL HIGH (ref 6–20)
CO2: 23 mmol/L (ref 22–32)
Calcium: 9.3 mg/dL (ref 8.9–10.3)
Chloride: 103 mmol/L (ref 98–111)
Creatinine, Ser: 0.82 mg/dL (ref 0.61–1.24)
GFR, Estimated: >60 mL/min (ref >60)
Glucose, Bld: 93 mg/dL (ref 70–99)
Potassium: 3.4 mmol/L — ABNORMAL LOW (ref 3.5–5.1)
Sodium: 136 mmol/L (ref 135–145)
Total Bilirubin: 0.8 mg/dL (ref 0.0–1.2)
Total Protein: 7 g/dL (ref 6.5–8.1)

## 2024-02-19 LAB — CBC
HCT: 38.2 % — ABNORMAL LOW (ref 39.0–52.0)
Hemoglobin: 12.2 g/dL — ABNORMAL LOW (ref 13.0–17.0)
MCH: 20 pg — ABNORMAL LOW (ref 26.0–34.0)
MCHC: 31.9 g/dL (ref 30.0–36.0)
MCV: 62.5 fL — ABNORMAL LOW (ref 80.0–100.0)
Platelets: 193 10*3/uL (ref 150–400)
RBC: 6.11 MIL/uL — ABNORMAL HIGH (ref 4.22–5.81)
RDW: 17 % — ABNORMAL HIGH (ref 11.5–15.5)
WBC: 9.1 10*3/uL (ref 4.0–10.5)
nRBC: 0 % (ref 0.0–0.2)

## 2024-02-19 LAB — LITHIUM LEVEL: Lithium Lvl: <0.06 mmol/L — ABNORMAL LOW (ref 0.60–1.20)

## 2024-02-19 LAB — URINALYSIS, ROUTINE W REFLEX MICROSCOPIC
Bilirubin Urine: NEGATIVE
Glucose, UA: NEGATIVE mg/dL
Hgb urine dipstick: NEGATIVE
Ketones, ur: NEGATIVE mg/dL
Leukocytes,Ua: NEGATIVE
Nitrite: NEGATIVE
Protein, ur: NEGATIVE mg/dL
Specific Gravity, Urine: 1.021 (ref 1.005–1.030)
pH: 5 (ref 5.0–8.0)

## 2024-02-19 MED ORDER — POTASSIUM CHLORIDE 20 MEQ PO PACK
40.0000 meq | PACK | Freq: Once | ORAL | Status: AC
  Administered 2024-02-19: 40 meq via ORAL
  Filled 2024-02-19: qty 2

## 2024-02-19 NOTE — ED Provider Notes (Addendum)
 [place] EMERGENCY DEPARTMENT AT [facility] Provider Note   CSN: [id number] Arrival date & time: 02/19/24  2112     History  No chief complaint on file.   Eugene Hicks is a 33 y.o. male.  Patient with c/o generally feeling weak in past day - pt very limited historian - level 5 caveat. No focal or unilateral numbness or weakness. No change in speech or vision. No trauma/fall or syncope. Has been eating/drinking. Denies change in meds. No fevers/chills. No headache. No chest pain or sob. No abd pain or nvd. No dysuria or gu c/o. No extremity pain or swelling.   The history is provided by the patient, medical records and a parent. The history is limited by the condition of the patient.       Home Medications Prior to Admission medications   Medication Sig Start Date End Date Taking? Authorizing Provider  alprazolam  (XANAX ) 2 MG tablet Take 1-2 mg by mouth See admin instructions. Take 2mg  twice daily and 1mg  at bedtime    [provider]  clindamycin  (CLEOCIN ) 300 MG capsule Take 1 capsule (300 mg total) by mouth 4 (four) times daily. X 7 days 02/06/24   Orvilla Blander, MD  EPINEPHrine  (EPI-PEN) 0.3 mg/0.3 mL SOAJ injection Inject 0.3 mLs (0.3 mg total) into the muscle once. 07/26/13   Latanya Poisson, MD  hydrOXYzine  (ATARAX /VISTARIL ) 25 MG tablet Take 1 tablet (25 mg total) by mouth every 8 (eight) hours as needed for anxiety. 03/11/19   Shara Das, DO  lithium  carbonate 300 MG capsule Take 300 mg by mouth 2 (two) times daily.     [provider]  loperamide  (IMODIUM ) 2 MG capsule Take 1 capsule (2 mg total) by mouth 4 (four) times daily as needed for diarrhea or loose stools. 12/11/18   Idol, Julie, PA-C  propranolol (INNOPRAN XL) 80 MG 24 hr capsule Take 80 mg by mouth daily.     [provider]  QUEtiapine (SEROQUEL) 300 MG tablet Take 300 mg by mouth every evening.     [provider]      Allergies    Codeine, Lactose  intolerance (gi), Percocet [oxycodone -acetaminophen ], and Penicillins    Review of Systems   Review of Systems  Constitutional:  Negative for fever.  HENT:  Negative for sore throat.   Eyes:  Negative for visual disturbance.  Respiratory:  Negative for cough and shortness of breath.   Cardiovascular:  Negative for chest pain.  Gastrointestinal:  Negative for abdominal pain, diarrhea and vomiting.  Genitourinary:  Negative for dysuria and flank pain.  Musculoskeletal:  Negative for back pain and neck pain.  Skin:  Negative for rash.  Neurological:  Negative for headaches.    Physical Exam Updated Vital Signs BP (!) 125/90   Pulse 76   Temp 98.3 F (36.8 C) (Oral)   Resp 18   SpO2 99%  Physical Exam Vitals and nursing note reviewed.  Constitutional:      Appearance: Normal appearance. He is well-developed.  HENT:     Head: Atraumatic.     Nose: Nose normal.     Mouth/Throat:     Mouth: Mucous membranes are moist.     Pharynx: Oropharynx is clear.  Eyes:     General: No scleral icterus.    Conjunctiva/sclera: Conjunctivae normal.     Pupils: Pupils are equal, round, and reactive to light.  Neck:     Vascular: No carotid bruit.     Trachea:  No tracheal deviation.     Comments: Trachea midline. Thyroid  not grossly enlarged or tender. No neck stiffness or rigidity.  Cardiovascular:     Rate and Rhythm: Normal rate and regular rhythm.     Pulses: Normal pulses.     Heart sounds: Normal heart sounds. No murmur heard.    No friction rub. No gallop.  Pulmonary:     Effort: Pulmonary effort is normal. No accessory muscle usage or respiratory distress.     Breath sounds: Normal breath sounds.  Abdominal:     General: Bowel sounds are normal. There is no distension.     Palpations: Abdomen is soft. There is no mass.     Tenderness: There is no abdominal tenderness. There is no guarding.  Genitourinary:    Comments: No cva tenderness. Musculoskeletal:        General: No  swelling or tenderness.     Cervical back: Normal range of motion and neck supple. No rigidity or tenderness.     Right lower leg: No edema.     Left lower leg: No edema.  Lymphadenopathy:     Cervical: No cervical adenopathy.  Skin:    General: Skin is warm and dry.     Findings: No rash.  Neurological:     Mental Status: He is alert.     Comments: Alert, speech clear. Motor/sens grossly intact bil. Steady gait. No tremor or shakes.   Psychiatric:        Mood and Affect: Mood normal.     ED Results / Procedures / Treatments   Labs (all labs ordered are listed, but only abnormal results are displayed) Results for orders placed or performed during the hospital encounter of 02/19/24  Comprehensive metabolic panel with GFR   Collection Time: 02/19/24 10:39 PM  Result Value Ref Range   Sodium 136 135 - 145 mmol/L   Potassium 3.4 (L) 3.5 - 5.1 mmol/L   Chloride 103 98 - 111 mmol/L   CO2 23 22 - 32 mmol/L   Glucose, Bld 93 70 - 99 mg/dL   BUN 21 (H) 6 - 20 mg/dL   Creatinine, Ser 5.40 0.61 - 1.24 mg/dL   Calcium 9.3 8.9 - 98.1 mg/dL   Total Protein 7.0 6.5 - 8.1 g/dL   Albumin 4.2 3.5 - 5.0 g/dL   AST 19 15 - 41 U/L   ALT 32 0 - 44 U/L   Alkaline Phosphatase 72 38 - 126 U/L   Total Bilirubin 0.8 0.0 - 1.2 mg/dL   GFR, Estimated >19 >14 mL/min   Anion gap 10 5 - 15  CBC   Collection Time: 02/19/24 10:39 PM  Result Value Ref Range   WBC 9.1 4.0 - 10.5 K/uL   RBC 6.11 (H) 4.22 - 5.81 MIL/uL   Hemoglobin 12.2 (L) 13.0 - 17.0 g/dL   HCT 78.2 (L) 95.6 - 21.3 %   MCV 62.5 (L) 80.0 - 100.0 fL   MCH 20.0 (L) 26.0 - 34.0 pg   MCHC 31.9 30.0 - 36.0 g/dL   RDW 08.6 (H) 57.8 - 46.9 %   Platelets 193 150 - 400 K/uL   nRBC 0.0 0.0 - 0.2 %  Lithium  level   Collection Time: 02/19/24 10:39 PM  Result Value Ref Range   Lithium  Lvl <0.06 (L) 0.60 - 1.20 mmol/L  Urinalysis, Routine w reflex microscopic -Urine, Clean Catch   Collection Time: 02/19/24 11:29 PM  Result Value Ref Range    Color, Urine YELLOW  YELLOW   APPearance CLEAR CLEAR   Specific Gravity, Urine 1.021 1.005 - 1.030   pH 5.0 5.0 - 8.0   Glucose, UA NEGATIVE NEGATIVE mg/dL   Hgb urine dipstick NEGATIVE NEGATIVE   Bilirubin Urine NEGATIVE NEGATIVE   Ketones, ur NEGATIVE NEGATIVE mg/dL   Protein, ur NEGATIVE NEGATIVE mg/dL   Nitrite NEGATIVE NEGATIVE   Leukocytes,Ua NEGATIVE NEGATIVE    EKG None  Radiology No results found.  Procedures Procedures    Medications Ordered in ED Medications  potassium chloride  (KLOR-CON ) packet 40 mEq (40 mEq Oral Given 02/19/24 2318)    ED Course/ Medical Decision Making/ A&P                                 Medical Decision Making Problems Addressed: Generalized weakness: acute illness or injury with systemic symptoms that poses a threat to life or bodily functions Hypokalemia: acute illness or injury  Amount and/or Complexity of Data Reviewed Independent Historian: parent    Details: hx External Data Reviewed: notes. Labs: ordered. Decision-making details documented in ED Course.  Risk Prescription drug management. Decision regarding hospitalization.   Labs ordered/sent.  Differential diagnosis includes anemia, hypoglycemia, aki, dehydration, elevated lithium  level, etc. Dispo decision including potential need for admission considered - will get labs and reassess.   Reviewed nursing notes and prior charts for additional history. External reports reviewed. Additional history from: parent.   Labs reviewed/interpreted by me - wbc normal. Hgb 12. K sl low. Kcl po. Ua neg for uti. Lit neg.   Po fluids/food.   Recheck, alert, content, no distress. Tolerating po.  Pt currently appears stable for d/c.   Rec close pcp f/u.  Return precautions provided.         Final Clinical Impression(s) / ED Diagnoses Final diagnoses:  Generalized weakness  Hypokalemia    Rx / DC Orders ED Discharge Orders     None         Guadalupe Lee,  MD 02/19/24 8119    Guadalupe Lee, MD 02/20/24 0004

## 2024-02-19 NOTE — Discharge Instructions (Addendum)
 It was our pleasure to provide your ER care today - we hope that you feel better.  Drink plenty of fluids/stay well hydrated. Your potassium level is slightly low - eat plenty of fruits and vegetables, and follow up with your doctor.   Follow up closely with primary care doctor this coming week.  Return to ER if worse, new symptoms, fevers, new/severe pain, chest pain, trouble breathing, abdominal pain, vomiting, fainting, or other concern.

## 2024-02-19 NOTE — ED Triage Notes (Signed)
 Pt states that he feels weak and dehydrated.
